# Patient Record
Sex: Male | Born: 1935 | Race: White | Hispanic: No | Marital: Married | Smoking: Former smoker
Health system: Southern US, Community
[De-identification: ages and names within clinical notes are randomized; demographics above are authoritative.]

## PROBLEM LIST (undated history)

## (undated) DIAGNOSIS — M1990 Unspecified osteoarthritis, unspecified site: Secondary | ICD-10-CM

## (undated) DIAGNOSIS — Z8679 Personal history of other diseases of the circulatory system: Secondary | ICD-10-CM

## (undated) DIAGNOSIS — F32A Depression, unspecified: Secondary | ICD-10-CM

## (undated) DIAGNOSIS — I739 Peripheral vascular disease, unspecified: Secondary | ICD-10-CM

## (undated) DIAGNOSIS — F0390 Unspecified dementia without behavioral disturbance: Secondary | ICD-10-CM

## (undated) DIAGNOSIS — G4730 Sleep apnea, unspecified: Secondary | ICD-10-CM

## (undated) DIAGNOSIS — G4733 Obstructive sleep apnea (adult) (pediatric): Secondary | ICD-10-CM

## (undated) DIAGNOSIS — E785 Hyperlipidemia, unspecified: Secondary | ICD-10-CM

## (undated) DIAGNOSIS — I10 Essential (primary) hypertension: Secondary | ICD-10-CM

## (undated) DIAGNOSIS — M75122 Complete rotator cuff tear or rupture of left shoulder, not specified as traumatic: Secondary | ICD-10-CM

## (undated) DIAGNOSIS — F329 Major depressive disorder, single episode, unspecified: Secondary | ICD-10-CM

## (undated) DIAGNOSIS — C801 Malignant (primary) neoplasm, unspecified: Secondary | ICD-10-CM

## (undated) DIAGNOSIS — I4891 Unspecified atrial fibrillation: Secondary | ICD-10-CM

## (undated) DIAGNOSIS — R011 Cardiac murmur, unspecified: Secondary | ICD-10-CM

## (undated) DIAGNOSIS — D649 Anemia, unspecified: Secondary | ICD-10-CM

## (undated) DIAGNOSIS — F419 Anxiety disorder, unspecified: Secondary | ICD-10-CM

## (undated) HISTORY — DX: Obstructive sleep apnea (adult) (pediatric): G47.33

## (undated) HISTORY — DX: Anxiety disorder, unspecified: F41.9

## (undated) HISTORY — PX: OTHER SURGICAL HISTORY: SHX169

## (undated) HISTORY — DX: Unspecified atrial fibrillation: I48.91

## (undated) HISTORY — DX: Peripheral vascular disease, unspecified: I73.9

## (undated) HISTORY — DX: Essential (primary) hypertension: I10

## (undated) HISTORY — DX: Cardiac murmur, unspecified: R01.1

## (undated) HISTORY — DX: Major depressive disorder, single episode, unspecified: F32.9

## (undated) HISTORY — DX: Complete rotator cuff tear or rupture of left shoulder, not specified as traumatic: M75.122

## (undated) HISTORY — DX: Personal history of other diseases of the circulatory system: Z86.79

## (undated) HISTORY — DX: Unspecified dementia without behavioral disturbance: F03.90

## (undated) HISTORY — DX: Depression, unspecified: F32.A

## (undated) HISTORY — DX: Anemia, unspecified: D64.9

## (undated) HISTORY — DX: Sleep apnea, unspecified: G47.30

## (undated) HISTORY — DX: Malignant (primary) neoplasm, unspecified: C80.1

## (undated) HISTORY — DX: Hyperlipidemia, unspecified: E78.5

---

## 2004-09-20 ENCOUNTER — Ambulatory Visit: Payer: Self-pay | Admitting: Family Medicine

## 2005-05-05 HISTORY — PX: SHOULDER SURGERY: SHX246

## 2006-06-01 ENCOUNTER — Ambulatory Visit: Payer: Self-pay | Admitting: Unknown Physician Specialty

## 2006-06-20 ENCOUNTER — Other Ambulatory Visit: Payer: Self-pay

## 2006-06-20 ENCOUNTER — Emergency Department: Payer: Self-pay | Admitting: Emergency Medicine

## 2009-02-15 ENCOUNTER — Ambulatory Visit: Payer: Self-pay | Admitting: Family Medicine

## 2011-09-19 ENCOUNTER — Ambulatory Visit: Payer: Self-pay | Admitting: Unknown Physician Specialty

## 2012-06-10 ENCOUNTER — Ambulatory Visit: Payer: Self-pay | Admitting: Family Medicine

## 2012-09-02 ENCOUNTER — Emergency Department: Payer: Self-pay | Admitting: Emergency Medicine

## 2012-09-02 LAB — URINALYSIS, COMPLETE
Glucose,UR: NEGATIVE mg/dL (ref 0–75)
Nitrite: NEGATIVE
Ph: 8 (ref 4.5–8.0)
Protein: NEGATIVE
RBC,UR: 28 /HPF (ref 0–5)
Squamous Epithelial: <1

## 2012-09-02 LAB — CBC
HCT: 40.6 % (ref 40.0–52.0)
HGB: 14.3 g/dL (ref 13.0–18.0)
MCV: 93 fL (ref 80–100)
Platelet: 159 10*3/uL (ref 150–440)
RBC: 4.37 10*6/uL — ABNORMAL LOW (ref 4.40–5.90)

## 2012-09-02 LAB — COMPREHENSIVE METABOLIC PANEL
BUN: 20 mg/dL — ABNORMAL HIGH (ref 7–18)
Bilirubin,Total: 0.8 mg/dL (ref 0.2–1.0)
Calcium, Total: 8.8 mg/dL (ref 8.5–10.1)
Creatinine: 1.18 mg/dL (ref 0.60–1.30)
EGFR (Non-African Amer.): >60
Glucose: 135 mg/dL — ABNORMAL HIGH (ref 65–99)
Potassium: 4.5 mmol/L (ref 3.5–5.1)
Sodium: 139 mmol/L (ref 136–145)

## 2013-02-24 ENCOUNTER — Ambulatory Visit: Payer: Self-pay | Admitting: Unknown Physician Specialty

## 2013-03-16 ENCOUNTER — Ambulatory Visit: Payer: Self-pay | Admitting: Unknown Physician Specialty

## 2013-04-20 ENCOUNTER — Ambulatory Visit: Payer: Self-pay | Admitting: Family Medicine

## 2013-11-07 DIAGNOSIS — R413 Other amnesia: Secondary | ICD-10-CM | POA: Insufficient documentation

## 2013-11-07 DIAGNOSIS — I10 Essential (primary) hypertension: Secondary | ICD-10-CM | POA: Insufficient documentation

## 2013-11-07 DIAGNOSIS — G4733 Obstructive sleep apnea (adult) (pediatric): Secondary | ICD-10-CM

## 2013-11-07 HISTORY — DX: Essential (primary) hypertension: I10

## 2013-11-07 HISTORY — DX: Obstructive sleep apnea (adult) (pediatric): G47.33

## 2013-12-26 ENCOUNTER — Ambulatory Visit: Payer: Self-pay | Admitting: Unknown Physician Specialty

## 2013-12-30 DIAGNOSIS — M75122 Complete rotator cuff tear or rupture of left shoulder, not specified as traumatic: Secondary | ICD-10-CM

## 2013-12-30 HISTORY — DX: Complete rotator cuff tear or rupture of left shoulder, not specified as traumatic: M75.122

## 2014-01-10 ENCOUNTER — Ambulatory Visit: Payer: Self-pay | Admitting: Unknown Physician Specialty

## 2014-01-10 LAB — CBC
HCT: 38.1 % — AB (ref 40.0–52.0)
HGB: 12.6 g/dL — AB (ref 13.0–18.0)
MCH: 32.2 pg (ref 26.0–34.0)
MCHC: 33.1 g/dL (ref 32.0–36.0)
MCV: 97 fL (ref 80–100)
PLATELETS: 180 10*3/uL (ref 150–440)
RBC: 3.92 10*6/uL — AB (ref 4.40–5.90)
RDW: 13.1 % (ref 11.5–14.5)
WBC: 7.8 10*3/uL (ref 3.8–10.6)

## 2014-01-10 LAB — BASIC METABOLIC PANEL
ANION GAP: 3 — AB (ref 7–16)
BUN: 18 mg/dL (ref 7–18)
CALCIUM: 8.4 mg/dL — AB (ref 8.5–10.1)
CO2: 31 mmol/L (ref 21–32)
Chloride: 108 mmol/L — ABNORMAL HIGH (ref 98–107)
Creatinine: 1.13 mg/dL (ref 0.60–1.30)
EGFR (Non-African Amer.): >60
Glucose: 99 mg/dL (ref 65–99)
OSMOLALITY: 285 (ref 275–301)
POTASSIUM: 4.1 mmol/L (ref 3.5–5.1)
Sodium: 142 mmol/L (ref 136–145)

## 2014-01-23 DIAGNOSIS — Z8679 Personal history of other diseases of the circulatory system: Secondary | ICD-10-CM

## 2014-01-23 DIAGNOSIS — E785 Hyperlipidemia, unspecified: Secondary | ICD-10-CM

## 2014-01-23 DIAGNOSIS — I4891 Unspecified atrial fibrillation: Secondary | ICD-10-CM

## 2014-01-23 HISTORY — DX: Hyperlipidemia, unspecified: E78.5

## 2014-01-23 HISTORY — DX: Personal history of other diseases of the circulatory system: Z86.79

## 2014-01-23 HISTORY — DX: Unspecified atrial fibrillation: I48.91

## 2014-01-27 ENCOUNTER — Ambulatory Visit: Payer: Self-pay | Admitting: Unknown Physician Specialty

## 2014-03-02 DIAGNOSIS — Z9889 Other specified postprocedural states: Secondary | ICD-10-CM | POA: Insufficient documentation

## 2014-09-11 DIAGNOSIS — F0390 Unspecified dementia without behavioral disturbance: Secondary | ICD-10-CM

## 2014-09-11 DIAGNOSIS — I739 Peripheral vascular disease, unspecified: Secondary | ICD-10-CM

## 2014-09-11 DIAGNOSIS — I779 Disorder of arteries and arterioles, unspecified: Secondary | ICD-10-CM

## 2014-09-11 DIAGNOSIS — D649 Anemia, unspecified: Secondary | ICD-10-CM

## 2014-09-11 HISTORY — DX: Unspecified dementia, unspecified severity, without behavioral disturbance, psychotic disturbance, mood disturbance, and anxiety: F03.90

## 2014-09-11 HISTORY — DX: Anemia, unspecified: D64.9

## 2014-09-11 HISTORY — DX: Disorder of arteries and arterioles, unspecified: I77.9

## 2015-03-02 DIAGNOSIS — F4321 Adjustment disorder with depressed mood: Secondary | ICD-10-CM | POA: Insufficient documentation

## 2015-04-10 DIAGNOSIS — F03A0 Unspecified dementia, mild, without behavioral disturbance, psychotic disturbance, mood disturbance, and anxiety: Secondary | ICD-10-CM | POA: Insufficient documentation

## 2015-04-10 DIAGNOSIS — F0390 Unspecified dementia without behavioral disturbance: Secondary | ICD-10-CM | POA: Insufficient documentation

## 2015-05-11 DIAGNOSIS — G4733 Obstructive sleep apnea (adult) (pediatric): Secondary | ICD-10-CM | POA: Diagnosis not present

## 2015-05-30 DIAGNOSIS — N401 Enlarged prostate with lower urinary tract symptoms: Secondary | ICD-10-CM | POA: Diagnosis not present

## 2015-05-30 DIAGNOSIS — R3915 Urgency of urination: Secondary | ICD-10-CM | POA: Diagnosis not present

## 2015-05-30 DIAGNOSIS — R350 Frequency of micturition: Secondary | ICD-10-CM | POA: Diagnosis not present

## 2015-06-11 ENCOUNTER — Ambulatory Visit (INDEPENDENT_AMBULATORY_CARE_PROVIDER_SITE_OTHER): Payer: PPO | Admitting: Urology

## 2015-06-11 ENCOUNTER — Encounter: Payer: Self-pay | Admitting: Urology

## 2015-06-11 VITALS — BP 151/70 | HR 63 | Ht 68.0 in | Wt 180.0 lb

## 2015-06-11 DIAGNOSIS — R350 Frequency of micturition: Secondary | ICD-10-CM | POA: Diagnosis not present

## 2015-06-11 LAB — BLADDER SCAN AMB NON-IMAGING

## 2015-06-11 NOTE — Progress Notes (Signed)
Consultation for lower urinary tract symptoms referred by Dr. Gilford Rile. Patient recently saw Dr. Yves Dill for similar symptoms but Dr. Yves Dill is out of his network. For 2-3 years patient has had a weak stream associated with frequency, urgency and urge incontinence. He's had noted sure your gross hematuria. Dr. Yves Dill gave him Rapaflo which he took for a week and the symptoms may have improved some.  His AUA symptom score is 23 with quality of life 6.  Patient denies other urologic history or surgery. 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  As far as prostate cancer screening he has no family history prostate cancer. His January 2017 PSA was 3.7.  Patient urologic history, past medical and surgical history, family history, social history, medications, allergies reviewed.  13 system review of systems was obtained which was negative apart from the following: Gastrointestinal-heartburn, constipation Constitutional-weight loss, fatigue EYE-blurred vision ENT-sinus problems Musculoskeletal-back pain, joint pain Neurologic-dizziness Psychologic-depression, anxiety  He could not provide a urine specimen today.  Bladder scan post void residual equals 59 mL  Physical exam: Patient no acute distress Neurologic-he ambulates normally and there are no obvious deficits GU: Penis circumcised without mass or lesion. Testicles descended bilaterally and palpably normal. No inguinal hernia palpated. On digital rectal exam the prostate was smooth without hard area or nodule. 1+. Rectal tone was normal. Back-there is an incision over the sacral spinal cord and obvious defect in the bone with herniation present.  Assessment/plan: BPH with urinary frequency, urgency, weak stream-normal PVR. Discussed proper fluid intake as patient has irritative voiding symptoms and constipation. He might not be drinking enough fluids. Discussed trial of alpha  blocker for a longer period versus OAB meds. Discussed nature risks benefits and alternatives to tamsulosin. All questions answered. Will start tamsulosin daily at bedtime for a few weeks and see him back. Symptoms remain bothersome could consider anticholinergic although concern for confusion. Also will get a urinalysis and another post void on return. He might be a good candidate for Myrbetriq or PTNS. Don't see any reason at current time for cystoscopy, but will consider.

## 2015-06-11 NOTE — Addendum Note (Signed)
Addended by: Tommy Rainwater on: 06/11/2015 04:33 PM   Modules accepted: Orders

## 2015-06-12 ENCOUNTER — Encounter: Payer: Self-pay | Admitting: Urology

## 2015-06-13 ENCOUNTER — Other Ambulatory Visit: Payer: Self-pay

## 2015-06-13 DIAGNOSIS — N400 Enlarged prostate without lower urinary tract symptoms: Secondary | ICD-10-CM

## 2015-06-13 MED ORDER — TAMSULOSIN HCL 0.4 MG PO CAPS
0.4000 mg | ORAL_CAPSULE | Freq: Every day | ORAL | Status: DC
Start: 2015-06-13 — End: 2015-12-14

## 2015-06-13 NOTE — Progress Notes (Signed)
Pt called stating Dr. Junious Silk wanted pt to start flomax. When pt called pharmacy medication was not at pharmacy. Nurse noted in Dr. Annita Brod dictation pt was to start flomax. Therefore medication was sent to pt pharmacy.

## 2015-06-21 DIAGNOSIS — G4733 Obstructive sleep apnea (adult) (pediatric): Secondary | ICD-10-CM | POA: Diagnosis not present

## 2015-06-21 DIAGNOSIS — I480 Paroxysmal atrial fibrillation: Secondary | ICD-10-CM | POA: Diagnosis not present

## 2015-06-21 DIAGNOSIS — Z125 Encounter for screening for malignant neoplasm of prostate: Secondary | ICD-10-CM | POA: Diagnosis not present

## 2015-06-21 DIAGNOSIS — D649 Anemia, unspecified: Secondary | ICD-10-CM | POA: Diagnosis not present

## 2015-06-21 DIAGNOSIS — I10 Essential (primary) hypertension: Secondary | ICD-10-CM | POA: Diagnosis not present

## 2015-08-06 ENCOUNTER — Ambulatory Visit: Payer: PPO | Admitting: Urology

## 2015-08-06 ENCOUNTER — Encounter: Payer: Self-pay | Admitting: Urology

## 2015-08-10 ENCOUNTER — Ambulatory Visit (INDEPENDENT_AMBULATORY_CARE_PROVIDER_SITE_OTHER): Payer: PPO | Admitting: Urology

## 2015-08-10 VITALS — BP 172/65 | HR 61 | Temp 97.9°F | Resp 14 | Wt 183.0 lb

## 2015-08-10 DIAGNOSIS — N400 Enlarged prostate without lower urinary tract symptoms: Secondary | ICD-10-CM | POA: Diagnosis not present

## 2015-08-10 DIAGNOSIS — N3281 Overactive bladder: Secondary | ICD-10-CM | POA: Diagnosis not present

## 2015-08-10 LAB — BLADDER SCAN AMB NON-IMAGING: SCAN RESULT: 86

## 2015-08-10 MED ORDER — MIRABEGRON ER 25 MG PO TB24: 25.0000 mg | ORAL_TABLET | Freq: Every day | ORAL | Status: DC

## 2015-08-10 NOTE — Progress Notes (Signed)
08/10/2015 3:45 PM   [name] [name] 1935/02/12 [id number]  Referring provider: Madelyn Brunner, MD [name] [place], [place] [address]  Chief Complaint  Patient presents with  . Urinary Incontinence    Patient is here to follow up on Flomax. Maybe some improvement since been on medication noticed.  . Urinary Frequency    HPI: Consultation for lower urinary tract symptoms referred by Dr. Gilford Rile. Patient recently saw Dr. Yves Ho for similar symptoms but Dr. Yves Ho is out of his network. For 2-3 years patient has had a weak stream associated with frequency, urgency and urge incontinence. He's had noted sure your gross hematuria. Dr. Yves Ho gave him Rapaflo which he took for a week and the symptoms may have improved some.  His AUA symptom score is 23 with quality of life 6.  Patient denies other urologic history or surgery. 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  As far as prostate cancer screening he has no family history prostate cancer. His January 2017 PSA was 3.7.  April 2017 interval history: Patient presents today for follow-up after being on Flomax.The patient I PSS score today is 11/6. He has significant urgency with a score of 5. He also has nocturia 3 and is score rating of 3 another frequency category. He finds this really miserable. He does often has urgent incontinence. He has noticed minimal improvement with Flomax. History of early dementia.   PMH: Past Medical History  Diagnosis Date  . 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 Anxiety   . Depression   . Heart murmur   . Hypertension   . Sleep apnea   . Atrial fibrillation ([place]) 01/23/2014  . BP (high blood pressure) 11/07/2013  . Carotid artery disease ([place]) 09/11/2014  . Obstructive apnea 11/07/2013  . Dementia 09/11/2014  . Complete rotator cuff rupture of left shoulder 12/30/2013  . Absolute anemia 09/11/2014    . HLD (hyperlipidemia) 01/23/2014  . Personal history of other diseases of the circulatory system 01/23/2014    Surgical History: Past Surgical History  Procedure Laterality Date  . Shoulder surgery Right 2007  . Back surgery- tumor removal      Home Medications:    Medication List       This list is accurate as of: 08/10/15  3:45 PM.  Always use your most recent med list.               amLODipine 5 MG tablet  Commonly known as:  NORVASC  Take by mouth.     aspirin EC 81 MG tablet  Take by mouth.     b complex vitamins tablet  Take 1 tablet by mouth daily.     BLACK CURRANT SEED OIL PO  Take by mouth.     cyanocobalamin 500 MCG tablet  Take 500 mcg by mouth daily.     donepezil 10 MG tablet  Commonly known as:  ARICEPT  Take by mouth.     Fish Oil 1000 MG Caps  Take by mouth.     memantine 5 MG tablet  Commonly known as:  NAMENDA  Take by mouth.     MULTI-VITAMINS Tabs  Take by mouth.     RAPAFLO 8 MG Caps capsule  Generic drug:  silodosin  Take 8 mg by mouth daily with breakfast.     tamsulosin 0.4 MG Caps capsule  Commonly known as:  FLOMAX  Take 1 capsule (0.4 mg total) by mouth daily.     vitamin C 1000 MG tablet  Take by mouth.        Allergies:  Allergies  Allergen Reactions  . Soybean Oil     Other reaction(s): Other (See Comments), Unknown Other Reaction: Rash;skin eruptions    Family History: Family History  Problem Relation Age of Onset  . Bladder Cancer Neg Hx   . Prostate cancer Neg Hx   . Kidney cancer Neg Hx     Social History:  reports that he has quit smoking. He does not have any smokeless tobacco history on file. He reports that he does not drink alcohol or use illicit drugs.  ROS: UROLOGY Frequent Urination?: Yes Hard to postpone urination?: Yes Burning/pain with urination?: No Get up at night to urinate?: Yes Leakage of urine?: Yes Urine stream starts and stops?: No Trouble starting stream?: No Do you have  to strain to urinate?: No Blood in urine?: No Urinary tract infection?: No Sexually transmitted disease?: No Injury to kidneys or bladder?: No Painful intercourse?: No Weak stream?: No Erection problems?: Yes Penile pain?: No  Gastrointestinal Nausea?: No Vomiting?: No Indigestion/heartburn?: Yes Diarrhea?: No Constipation?: Yes  Constitutional Fever: No Night sweats?: No Weight loss?: Yes Fatigue?: Yes  Skin Skin rash/lesions?: No Itching?: No  Eyes Blurred vision?: Yes Double vision?: No  Ears/Nose/Throat Sore throat?: No Sinus problems?: No  Hematologic/Lymphatic Swollen glands?: No Easy bruising?: Yes  Cardiovascular Leg swelling?: No Chest pain?: No  Respiratory Cough?: No Shortness of breath?: No  Endocrine Excessive thirst?: No  Musculoskeletal Back pain?: No Joint pain?: No  Neurological Headaches?: No Dizziness?: Yes  Psychologic Depression?: Yes Anxiety?: Yes  Physical Exam: BP 172/65 mmHg  Pulse 61  Temp(Src) 97.9 F (36.6 C)  Resp 14  Wt 183 lb (83.008 kg)  Constitutional:  Alert and oriented, No acute distress. HEENT: [place] AT, moist mucus membranes.  Trachea midline, no masses. Cardiovascular: No clubbing, cyanosis, or edema. Respiratory: Normal respiratory effort, no increased work of breathing. GI: Abdomen is soft, nontender, nondistended, no abdominal masses GU: No CVA tenderness.  Skin: No rashes, bruises or suspicious lesions. Lymph: No cervical or inguinal adenopathy. Neurologic: Grossly intact, no focal deficits, moving all 4 extremities. Psychiatric: Normal mood and affect.  Laboratory Data: Lab Results  Component Value Date   WBC 7.8 01/10/2014   HGB 12.6* 01/10/2014   HCT 38.1* 01/10/2014   MCV 97 01/10/2014   PLT 180 01/10/2014    Lab Results  Component Value Date   CREATININE 1.13 01/10/2014    No results found for: PSA  No results found for: TESTOSTERONE  No results found for:  HGBA1C  Urinalysis    Component Value Date/Time   COLORURINE Yellow 09/02/2012 2151   APPEARANCEUR Clear 09/02/2012 2151   LABSPEC 1.018 09/02/2012 2151   PHURINE 8.0 09/02/2012 2151   GLUCOSEU Negative 09/02/2012 2151   HGBUR 1+ 09/02/2012 2151   BILIRUBINUR Negative 09/02/2012 2151   KETONESUR Negative 09/02/2012 2151   PROTEINUR Negative 09/02/2012 2151   NITRITE Negative 09/02/2012 2151   LEUKOCYTESUR Negative 09/02/2012 2151     Assessment & Plan:    1. BPH -continue flomax  2. Overactive bladder I have started the patient on Myrbetriq 25 mg daily. Hopefully this will help with his overactive bladder and urgency symptoms. He is not a candidate for anticholinergics due to his early dementia. He'll follow-up in 3 months to discuss  his progress.  Return in about 3 months (around 11/09/2015).  Nickie Retort, MD  [facility] [address] [place], [place] [address] [phone]

## 2015-08-16 ENCOUNTER — Telehealth: Payer: Self-pay

## 2015-08-16 NOTE — Telephone Encounter (Signed)
Opened in error

## 2015-09-06 DIAGNOSIS — I10 Essential (primary) hypertension: Secondary | ICD-10-CM | POA: Diagnosis not present

## 2015-09-06 DIAGNOSIS — Z125 Encounter for screening for malignant neoplasm of prostate: Secondary | ICD-10-CM | POA: Diagnosis not present

## 2015-09-06 DIAGNOSIS — G4733 Obstructive sleep apnea (adult) (pediatric): Secondary | ICD-10-CM | POA: Diagnosis not present

## 2015-09-06 DIAGNOSIS — D649 Anemia, unspecified: Secondary | ICD-10-CM | POA: Diagnosis not present

## 2015-09-13 DIAGNOSIS — D649 Anemia, unspecified: Secondary | ICD-10-CM | POA: Diagnosis not present

## 2015-09-13 DIAGNOSIS — G4733 Obstructive sleep apnea (adult) (pediatric): Secondary | ICD-10-CM | POA: Diagnosis not present

## 2015-09-13 DIAGNOSIS — I480 Paroxysmal atrial fibrillation: Secondary | ICD-10-CM | POA: Diagnosis not present

## 2015-09-13 DIAGNOSIS — G8929 Other chronic pain: Secondary | ICD-10-CM | POA: Diagnosis not present

## 2015-09-13 DIAGNOSIS — I10 Essential (primary) hypertension: Secondary | ICD-10-CM | POA: Diagnosis not present

## 2015-09-13 DIAGNOSIS — M545 Low back pain: Secondary | ICD-10-CM | POA: Diagnosis not present

## 2015-09-13 DIAGNOSIS — F0390 Unspecified dementia without behavioral disturbance: Secondary | ICD-10-CM | POA: Diagnosis not present

## 2015-09-13 DIAGNOSIS — M47816 Spondylosis without myelopathy or radiculopathy, lumbar region: Secondary | ICD-10-CM | POA: Diagnosis not present

## 2015-09-13 DIAGNOSIS — I779 Disorder of arteries and arterioles, unspecified: Secondary | ICD-10-CM | POA: Diagnosis not present

## 2015-09-13 DIAGNOSIS — E782 Mixed hyperlipidemia: Secondary | ICD-10-CM | POA: Diagnosis not present

## 2015-10-09 DIAGNOSIS — I10 Essential (primary) hypertension: Secondary | ICD-10-CM | POA: Diagnosis not present

## 2015-10-09 DIAGNOSIS — F0390 Unspecified dementia without behavioral disturbance: Secondary | ICD-10-CM | POA: Diagnosis not present

## 2015-10-09 DIAGNOSIS — Z634 Disappearance and death of family member: Secondary | ICD-10-CM | POA: Diagnosis not present

## 2015-10-09 DIAGNOSIS — G4733 Obstructive sleep apnea (adult) (pediatric): Secondary | ICD-10-CM | POA: Diagnosis not present

## 2015-10-09 DIAGNOSIS — F4321 Adjustment disorder with depressed mood: Secondary | ICD-10-CM | POA: Diagnosis not present

## 2015-11-12 ENCOUNTER — Ambulatory Visit (INDEPENDENT_AMBULATORY_CARE_PROVIDER_SITE_OTHER): Payer: PPO | Admitting: Urology

## 2015-11-12 ENCOUNTER — Encounter: Payer: Self-pay | Admitting: Urology

## 2015-11-12 VITALS — BP 158/73 | HR 46 | Ht 69.0 in | Wt 181.0 lb

## 2015-11-12 DIAGNOSIS — N3281 Overactive bladder: Secondary | ICD-10-CM | POA: Diagnosis not present

## 2015-11-12 LAB — BLADDER SCAN AMB NON-IMAGING

## 2015-11-12 NOTE — Progress Notes (Signed)
81 year old male who is seen today in follow-up after being placed on myrbetriq 25 mg daily 3 months prior in addition to his tamsulosin. He was initially seen with frequency, urgency, and incontinence. His voiding symptoms improved significantly on tamsulosin alone. However, he continued to have urinary urgency and occasional urge incontinence. As such, we started the patient on myrbetriq 25 mg daily. He subsequently presents today for further eval. The patient states that he is completed the myrbetriq and does not appreciate any significant difference. However, overall the patient does feel as if he is voiding better and has had less axes. He is "and more control of his symptoms". Next  The patient has dementia and his history was obtained through the patient as well as the patient's wife.  Current Outpatient Prescriptions on File Prior to Visit  Medication Sig Dispense Refill  . amLODipine (NORVASC) 5 MG tablet Take by mouth.    . Ascorbic Acid (VITAMIN C) 1000 MG tablet Take by mouth.    Marland Kitchen aspirin EC 81 MG tablet Take by mouth.    Marland Kitchen b complex vitamins tablet Take 1 tablet by mouth daily.    Marland Kitchen BLACK CURRANT SEED OIL PO Take by mouth.    . cyanocobalamin 500 MCG tablet Take 500 mcg by mouth daily.    Marland Kitchen donepezil (ARICEPT) 10 MG tablet Take by mouth.    . memantine (NAMENDA) 5 MG tablet Take by mouth.    . Multiple Vitamin (MULTI-VITAMINS) TABS Take by mouth.    . Omega-3 Fatty Acids (FISH OIL) 1000 MG CAPS Take by mouth.    . tamsulosin (FLOMAX) 0.4 MG CAPS capsule Take 1 capsule (0.4 mg total) by mouth daily. 30 capsule 3   No current facility-administered medications on file prior to visit.   Past Medical History  Diagnosis Date  . 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 Anxiety   . Depression   . Heart murmur   . Hypertension   . Sleep apnea   . Atrial fibrillation ([place]) 01/23/2014  . BP (high blood pressure) 11/07/2013  . Carotid artery disease ([place]) 09/11/2014  .  Obstructive apnea 11/07/2013  . Dementia 09/11/2014  . Complete rotator cuff rupture of left shoulder 12/30/2013  . Absolute anemia 09/11/2014  . HLD (hyperlipidemia) 01/23/2014  . Personal history of other diseases of the circulatory system 01/23/2014   Past Surgical History  Procedure Laterality Date  . Shoulder surgery Right 2007  . Back surgery- tumor removal      PE: NAD Alert and ORiented  Impression/Rec: The patient's voiding symptoms are significantly better on tamsulosin alone. He did not seem to have any significant benefit of myrbetriq 25 mg daily. We discussed increasing this to 50 mg daily although I did tell the patient that this was a quality of life decision and not necessary a few is okay with the way his symptoms were today. As such, the patient has opted to continue a tamsulosin but would not like to try any additional medications for his overactive symptoms. Our plan at this point is to have the patient return in 1 year for follow-up of his lower urinary tract symptoms. He'll continue tamsulosin 2.4 milligrams daily. I encouraged the patient to come back sooner with progression of symptoms.

## 2015-11-13 DIAGNOSIS — I6523 Occlusion and stenosis of bilateral carotid arteries: Secondary | ICD-10-CM | POA: Diagnosis not present

## 2015-11-13 DIAGNOSIS — I6529 Occlusion and stenosis of unspecified carotid artery: Secondary | ICD-10-CM | POA: Diagnosis not present

## 2015-11-13 DIAGNOSIS — E785 Hyperlipidemia, unspecified: Secondary | ICD-10-CM | POA: Diagnosis not present

## 2015-11-13 DIAGNOSIS — I10 Essential (primary) hypertension: Secondary | ICD-10-CM | POA: Diagnosis not present

## 2015-11-27 DIAGNOSIS — K432 Incisional hernia without obstruction or gangrene: Secondary | ICD-10-CM | POA: Diagnosis not present

## 2015-12-12 DIAGNOSIS — K458 Other specified abdominal hernia without obstruction or gangrene: Secondary | ICD-10-CM | POA: Diagnosis not present

## 2015-12-12 DIAGNOSIS — Z01818 Encounter for other preprocedural examination: Secondary | ICD-10-CM | POA: Diagnosis not present

## 2015-12-14 ENCOUNTER — Other Ambulatory Visit: Payer: Self-pay | Admitting: Urology

## 2015-12-14 DIAGNOSIS — N400 Enlarged prostate without lower urinary tract symptoms: Secondary | ICD-10-CM

## 2015-12-18 DIAGNOSIS — K458 Other specified abdominal hernia without obstruction or gangrene: Secondary | ICD-10-CM | POA: Diagnosis not present

## 2015-12-18 DIAGNOSIS — R194 Change in bowel habit: Secondary | ICD-10-CM | POA: Diagnosis not present

## 2015-12-18 DIAGNOSIS — K5730 Diverticulosis of large intestine without perforation or abscess without bleeding: Secondary | ICD-10-CM | POA: Diagnosis not present

## 2015-12-18 DIAGNOSIS — Z1211 Encounter for screening for malignant neoplasm of colon: Secondary | ICD-10-CM | POA: Diagnosis not present

## 2015-12-19 DIAGNOSIS — Z01818 Encounter for other preprocedural examination: Secondary | ICD-10-CM | POA: Diagnosis not present

## 2015-12-19 DIAGNOSIS — K458 Other specified abdominal hernia without obstruction or gangrene: Secondary | ICD-10-CM | POA: Diagnosis not present

## 2015-12-24 DIAGNOSIS — Z961 Presence of intraocular lens: Secondary | ICD-10-CM | POA: Diagnosis not present

## 2015-12-24 DIAGNOSIS — H35373 Puckering of macula, bilateral: Secondary | ICD-10-CM | POA: Diagnosis not present

## 2015-12-26 DIAGNOSIS — K458 Other specified abdominal hernia without obstruction or gangrene: Secondary | ICD-10-CM | POA: Diagnosis not present

## 2016-01-25 DIAGNOSIS — K458 Other specified abdominal hernia without obstruction or gangrene: Secondary | ICD-10-CM | POA: Diagnosis not present

## 2016-01-28 DIAGNOSIS — K458 Other specified abdominal hernia without obstruction or gangrene: Secondary | ICD-10-CM | POA: Diagnosis not present

## 2016-02-15 DIAGNOSIS — M5442 Lumbago with sciatica, left side: Secondary | ICD-10-CM | POA: Diagnosis not present

## 2016-02-15 DIAGNOSIS — M533 Sacrococcygeal disorders, not elsewhere classified: Secondary | ICD-10-CM | POA: Diagnosis not present

## 2016-02-15 DIAGNOSIS — M5441 Lumbago with sciatica, right side: Secondary | ICD-10-CM | POA: Diagnosis not present

## 2016-02-16 ENCOUNTER — Encounter (HOSPITAL_COMMUNITY): Payer: Self-pay | Admitting: Neurology

## 2016-02-16 ENCOUNTER — Inpatient Hospital Stay (HOSPITAL_COMMUNITY)
Admission: EM | Admit: 2016-02-16 | Discharge: 2016-02-25 | DRG: 963 | Disposition: A | Discharge status: Skilled Nursing Facility | Payer: PPO | Attending: General Surgery | Admitting: General Surgery

## 2016-02-16 ENCOUNTER — Emergency Department: Payer: PPO

## 2016-02-16 ENCOUNTER — Encounter: Payer: Self-pay | Admitting: Emergency Medicine

## 2016-02-16 ENCOUNTER — Emergency Department
Admission: EM | Admit: 2016-02-16 | Discharge: 2016-02-16 | Payer: PPO | Attending: Emergency Medicine | Admitting: Emergency Medicine

## 2016-02-16 DIAGNOSIS — S0101XA Laceration without foreign body of scalp, initial encounter: Secondary | ICD-10-CM | POA: Diagnosis not present

## 2016-02-16 DIAGNOSIS — W19XXXA Unspecified fall, initial encounter: Secondary | ICD-10-CM | POA: Diagnosis not present

## 2016-02-16 DIAGNOSIS — G4733 Obstructive sleep apnea (adult) (pediatric): Secondary | ICD-10-CM | POA: Diagnosis not present

## 2016-02-16 DIAGNOSIS — W1839XA Other fall on same level, initial encounter: Secondary | ICD-10-CM | POA: Diagnosis present

## 2016-02-16 DIAGNOSIS — R0989 Other specified symptoms and signs involving the circulatory and respiratory systems: Secondary | ICD-10-CM

## 2016-02-16 DIAGNOSIS — Z87891 Personal history of nicotine dependence: Secondary | ICD-10-CM | POA: Insufficient documentation

## 2016-02-16 DIAGNOSIS — Z7982 Long term (current) use of aspirin: Secondary | ICD-10-CM | POA: Insufficient documentation

## 2016-02-16 DIAGNOSIS — S3991XA Unspecified injury of abdomen, initial encounter: Secondary | ICD-10-CM | POA: Diagnosis not present

## 2016-02-16 DIAGNOSIS — I4510 Unspecified right bundle-branch block: Secondary | ICD-10-CM | POA: Diagnosis not present

## 2016-02-16 DIAGNOSIS — F419 Anxiety disorder, unspecified: Secondary | ICD-10-CM | POA: Diagnosis present

## 2016-02-16 DIAGNOSIS — I4891 Unspecified atrial fibrillation: Secondary | ICD-10-CM | POA: Diagnosis not present

## 2016-02-16 DIAGNOSIS — S3690XA Unspecified injury of unspecified intra-abdominal organ, initial encounter: Secondary | ICD-10-CM

## 2016-02-16 DIAGNOSIS — N281 Cyst of kidney, acquired: Secondary | ICD-10-CM | POA: Insufficient documentation

## 2016-02-16 DIAGNOSIS — Z930 Tracheostomy status: Secondary | ICD-10-CM

## 2016-02-16 DIAGNOSIS — I10 Essential (primary) hypertension: Secondary | ICD-10-CM | POA: Diagnosis present

## 2016-02-16 DIAGNOSIS — D62 Acute posthemorrhagic anemia: Secondary | ICD-10-CM | POA: Diagnosis not present

## 2016-02-16 DIAGNOSIS — R310 Gross hematuria: Secondary | ICD-10-CM | POA: Diagnosis not present

## 2016-02-16 DIAGNOSIS — I480 Paroxysmal atrial fibrillation: Secondary | ICD-10-CM | POA: Diagnosis not present

## 2016-02-16 DIAGNOSIS — F329 Major depressive disorder, single episode, unspecified: Secondary | ICD-10-CM | POA: Diagnosis present

## 2016-02-16 DIAGNOSIS — F0281 Dementia in other diseases classified elsewhere with behavioral disturbance: Secondary | ICD-10-CM | POA: Insufficient documentation

## 2016-02-16 DIAGNOSIS — J90 Pleural effusion, not elsewhere classified: Secondary | ICD-10-CM | POA: Diagnosis not present

## 2016-02-16 DIAGNOSIS — S0100XA Unspecified open wound of scalp, initial encounter: Secondary | ICD-10-CM | POA: Diagnosis not present

## 2016-02-16 DIAGNOSIS — Z79899 Other long term (current) drug therapy: Secondary | ICD-10-CM | POA: Diagnosis not present

## 2016-02-16 DIAGNOSIS — M25522 Pain in left elbow: Secondary | ICD-10-CM | POA: Diagnosis not present

## 2016-02-16 DIAGNOSIS — S299XXA Unspecified injury of thorax, initial encounter: Secondary | ICD-10-CM | POA: Diagnosis not present

## 2016-02-16 DIAGNOSIS — Y939 Activity, unspecified: Secondary | ICD-10-CM | POA: Insufficient documentation

## 2016-02-16 DIAGNOSIS — S270XXA Traumatic pneumothorax, initial encounter: Principal | ICD-10-CM | POA: Diagnosis present

## 2016-02-16 DIAGNOSIS — S36039A Unspecified laceration of spleen, initial encounter: Secondary | ICD-10-CM | POA: Diagnosis present

## 2016-02-16 DIAGNOSIS — Z8679 Personal history of other diseases of the circulatory system: Secondary | ICD-10-CM | POA: Diagnosis not present

## 2016-02-16 DIAGNOSIS — M533 Sacrococcygeal disorders, not elsewhere classified: Secondary | ICD-10-CM | POA: Diagnosis present

## 2016-02-16 DIAGNOSIS — F0280 Dementia in other diseases classified elsewhere without behavioral disturbance: Secondary | ICD-10-CM | POA: Diagnosis not present

## 2016-02-16 DIAGNOSIS — E785 Hyperlipidemia, unspecified: Secondary | ICD-10-CM | POA: Diagnosis not present

## 2016-02-16 DIAGNOSIS — G309 Alzheimer's disease, unspecified: Secondary | ICD-10-CM | POA: Insufficient documentation

## 2016-02-16 DIAGNOSIS — Y999 Unspecified external cause status: Secondary | ICD-10-CM | POA: Insufficient documentation

## 2016-02-16 DIAGNOSIS — S2242XA Multiple fractures of ribs, left side, initial encounter for closed fracture: Secondary | ICD-10-CM | POA: Diagnosis present

## 2016-02-16 DIAGNOSIS — R10812 Left upper quadrant abdominal tenderness: Secondary | ICD-10-CM | POA: Diagnosis not present

## 2016-02-16 DIAGNOSIS — K661 Hemoperitoneum: Secondary | ICD-10-CM

## 2016-02-16 DIAGNOSIS — Y9222 Religious institution as the place of occurrence of the external cause: Secondary | ICD-10-CM | POA: Insufficient documentation

## 2016-02-16 DIAGNOSIS — S36030A Superficial (capsular) laceration of spleen, initial encounter: Secondary | ICD-10-CM | POA: Diagnosis not present

## 2016-02-16 DIAGNOSIS — S59902A Unspecified injury of left elbow, initial encounter: Secondary | ICD-10-CM | POA: Diagnosis not present

## 2016-02-16 DIAGNOSIS — Z91018 Allergy to other foods: Secondary | ICD-10-CM | POA: Diagnosis not present

## 2016-02-16 DIAGNOSIS — M542 Cervicalgia: Secondary | ICD-10-CM

## 2016-02-16 DIAGNOSIS — T1490XA Injury, unspecified, initial encounter: Secondary | ICD-10-CM

## 2016-02-16 DIAGNOSIS — S199XXA Unspecified injury of neck, initial encounter: Secondary | ICD-10-CM | POA: Diagnosis not present

## 2016-02-16 DIAGNOSIS — R0781 Pleurodynia: Secondary | ICD-10-CM | POA: Diagnosis not present

## 2016-02-16 DIAGNOSIS — F0390 Unspecified dementia without behavioral disturbance: Secondary | ICD-10-CM | POA: Diagnosis not present

## 2016-02-16 DIAGNOSIS — I2510 Atherosclerotic heart disease of native coronary artery without angina pectoris: Secondary | ICD-10-CM | POA: Diagnosis present

## 2016-02-16 DIAGNOSIS — S0990XA Unspecified injury of head, initial encounter: Secondary | ICD-10-CM | POA: Diagnosis not present

## 2016-02-16 DIAGNOSIS — S2249XA Multiple fractures of ribs, unspecified side, initial encounter for closed fracture: Secondary | ICD-10-CM

## 2016-02-16 LAB — CBC
HCT: 39.2 % — ABNORMAL LOW (ref 40.0–52.0)
Hemoglobin: 13.3 g/dL (ref 13.0–18.0)
MCH: 32 pg (ref 26.0–34.0)
MCHC: 34 g/dL (ref 32.0–36.0)
MCV: 94.1 fL (ref 80.0–100.0)
PLATELETS: 144 10*3/uL — AB (ref 150–440)
RBC: 4.17 MIL/uL — ABNORMAL LOW (ref 4.40–5.90)
RDW: 13.1 % (ref 11.5–14.5)
WBC: 16.6 10*3/uL — AB (ref 3.8–10.6)

## 2016-02-16 LAB — BASIC METABOLIC PANEL
Anion gap: 8 (ref 5–15)
BUN: 27 mg/dL — AB (ref 6–20)
CALCIUM: 9 mg/dL (ref 8.9–10.3)
CO2: 26 mmol/L (ref 22–32)
CREATININE: 1.38 mg/dL — AB (ref 0.61–1.24)
Chloride: 107 mmol/L (ref 101–111)
GFR, EST AFRICAN AMERICAN: 55 mL/min — AB (ref >60)
GFR, EST NON AFRICAN AMERICAN: 47 mL/min — AB (ref >60)
Glucose, Bld: 131 mg/dL — ABNORMAL HIGH (ref 65–99)
Potassium: 3.9 mmol/L (ref 3.5–5.1)
SODIUM: 141 mmol/L (ref 135–145)

## 2016-02-16 LAB — URINALYSIS COMPLETE WITH MICROSCOPIC (ARMC ONLY)
BILIRUBIN URINE: NEGATIVE
Bacteria, UA: NONE SEEN
GLUCOSE, UA: NEGATIVE mg/dL
Leukocytes, UA: NEGATIVE
NITRITE: NEGATIVE
PROTEIN: NEGATIVE mg/dL
Specific Gravity, Urine: 1.042 — ABNORMAL HIGH (ref 1.005–1.030)
Squamous Epithelial / LPF: NONE SEEN
pH: 6 (ref 5.0–8.0)

## 2016-02-16 LAB — HEPATIC FUNCTION PANEL
ALBUMIN: 3 g/dL — AB (ref 3.5–5.0)
ALT: 16 U/L — AB (ref 17–63)
AST: 28 U/L (ref 15–41)
Alkaline Phosphatase: 39 U/L (ref 38–126)
BILIRUBIN INDIRECT: 0.7 mg/dL (ref 0.3–0.9)
Bilirubin, Direct: 0.1 mg/dL (ref 0.1–0.5)
TOTAL PROTEIN: 5.3 g/dL — AB (ref 6.5–8.1)
Total Bilirubin: 0.8 mg/dL (ref 0.3–1.2)

## 2016-02-16 LAB — TYPE AND SCREEN
ABO/RH(D): A NEG
ANTIBODY SCREEN: NEGATIVE

## 2016-02-16 LAB — APTT: aPTT: 28 seconds (ref 24–36)

## 2016-02-16 LAB — PROTIME-INR
INR: 1.14
Prothrombin Time: 14.7 seconds (ref 11.4–15.2)

## 2016-02-16 LAB — AMYLASE: AMYLASE: 102 U/L — AB (ref 28–100)

## 2016-02-16 MED ORDER — ONDANSETRON HCL 4 MG/2ML IJ SOLN
4.0000 mg | Freq: Four times a day (QID) | INTRAMUSCULAR | Status: DC | PRN
  Administered 2016-02-19 – 2016-02-20 (×2): 4 mg via INTRAVENOUS
  Filled 2016-02-16 (×2): qty 2

## 2016-02-16 MED ORDER — METHOCARBAMOL 500 MG PO TABS
500.0000 mg | ORAL_TABLET | Freq: Three times a day (TID) | ORAL | Status: DC | PRN
  Administered 2016-02-20 – 2016-02-23 (×3): 500 mg via ORAL
  Filled 2016-02-16 (×3): qty 1

## 2016-02-16 MED ORDER — MEMANTINE HCL 5 MG PO TABS
5.0000 mg | ORAL_TABLET | Freq: Two times a day (BID) | ORAL | Status: DC
  Administered 2016-02-16 – 2016-02-25 (×17): 5 mg via ORAL
  Filled 2016-02-16 (×17): qty 1

## 2016-02-16 MED ORDER — ONDANSETRON HCL 4 MG PO TABS: 4.0000 mg | ORAL_TABLET | Freq: Four times a day (QID) | ORAL | Status: DC | PRN

## 2016-02-16 MED ORDER — OXYCODONE HCL 5 MG PO TABS
2.5000 mg | ORAL_TABLET | ORAL | Status: DC | PRN
  Administered 2016-02-18: 2.5 mg via ORAL
  Filled 2016-02-16: qty 1

## 2016-02-16 MED ORDER — AMLODIPINE BESYLATE 5 MG PO TABS
5.0000 mg | ORAL_TABLET | Freq: Every day | ORAL | Status: DC
  Administered 2016-02-16 – 2016-02-25 (×8): 5 mg via ORAL
  Filled 2016-02-16 (×9): qty 1

## 2016-02-16 MED ORDER — SODIUM CHLORIDE 0.9 % IV BOLUS (SEPSIS)
1000.0000 mL | Freq: Once | INTRAVENOUS | Status: AC
  Administered 2016-02-16: 1000 mL via INTRAVENOUS

## 2016-02-16 MED ORDER — MORPHINE SULFATE (PF) 2 MG/ML IV SOLN
0.5000 mg | INTRAVENOUS | Status: DC | PRN
  Administered 2016-02-17: 0.5 mg via INTRAVENOUS
  Filled 2016-02-16: qty 1

## 2016-02-16 MED ORDER — IOPAMIDOL (ISOVUE-300) INJECTION 61%
100.0000 mL | Freq: Once | INTRAVENOUS | Status: AC | PRN
  Administered 2016-02-16: 100 mL via INTRAVENOUS

## 2016-02-16 MED ORDER — LIDOCAINE-EPINEPHRINE 2 %-1:100000 IJ SOLN
20.0000 mL | Freq: Once | INTRAMUSCULAR | Status: DC
  Filled 2016-02-16: qty 20

## 2016-02-16 MED ORDER — LIDOCAINE-EPINEPHRINE (PF) 1 %-1:200000 IJ SOLN
INTRAMUSCULAR | Status: AC
  Filled 2016-02-16: qty 30

## 2016-02-16 MED ORDER — POTASSIUM CHLORIDE IN NACL 20-0.9 MEQ/L-% IV SOLN
INTRAVENOUS | Status: DC
  Administered 2016-02-16 – 2016-02-17 (×3): via INTRAVENOUS
  Filled 2016-02-16 (×6): qty 1000

## 2016-02-16 MED ORDER — DOCUSATE SODIUM 100 MG PO CAPS
100.0000 mg | ORAL_CAPSULE | Freq: Two times a day (BID) | ORAL | Status: DC
  Administered 2016-02-16 – 2016-02-24 (×14): 100 mg via ORAL
  Filled 2016-02-16 (×16): qty 1

## 2016-02-16 MED ORDER — TETANUS-DIPHTH-ACELL PERTUSSIS 5-2.5-18.5 LF-MCG/0.5 IM SUSP
0.5000 mL | Freq: Once | INTRAMUSCULAR | Status: AC
  Administered 2016-02-16: 0.5 mL via INTRAMUSCULAR
  Filled 2016-02-16: qty 0.5

## 2016-02-16 MED ORDER — TAMSULOSIN HCL 0.4 MG PO CAPS
0.4000 mg | ORAL_CAPSULE | Freq: Every day | ORAL | Status: DC
  Administered 2016-02-16 – 2016-02-25 (×9): 0.4 mg via ORAL
  Filled 2016-02-16 (×9): qty 1

## 2016-02-16 MED ORDER — HYDROMORPHONE HCL 1 MG/ML IJ SOLN
1.0000 mg | Freq: Once | INTRAMUSCULAR | Status: AC
Start: 2016-02-16 — End: 2016-02-16
  Administered 2016-02-16: 1 mg via INTRAVENOUS
  Filled 2016-02-16: qty 1

## 2016-02-16 MED ORDER — DONEPEZIL HCL 10 MG PO TABS
10.0000 mg | ORAL_TABLET | Freq: Every day | ORAL | Status: DC
  Administered 2016-02-16 – 2016-02-24 (×9): 10 mg via ORAL
  Filled 2016-02-16: qty 2
  Filled 2016-02-16 (×8): qty 1

## 2016-02-16 MED ORDER — FENTANYL CITRATE (PF) 100 MCG/2ML IJ SOLN
75.0000 ug | Freq: Once | INTRAMUSCULAR | Status: AC
  Administered 2016-02-16: 75 ug via INTRAVENOUS
  Filled 2016-02-16: qty 2

## 2016-02-16 MED ORDER — ACETAMINOPHEN 325 MG PO TABS
650.0000 mg | ORAL_TABLET | Freq: Four times a day (QID) | ORAL | Status: AC
  Administered 2016-02-16 – 2016-02-19 (×5): 650 mg via ORAL
  Filled 2016-02-16 (×5): qty 2

## 2016-02-16 NOTE — ED Triage Notes (Signed)
Per [facility]- pt comes from [facility] ED trauma transfer accepted by Dr. Redmond Pulling for left sided rib fractures, small pneumothorax, free fluid in abdomen. Today pt was standing [place] a stage at church and fell backwards on the ground with questionable LOC. Received 2 L NS, has an additional 2 L NS infusing now. BP 150/78, HR 60, 100%. Pt has 2 lacerations to left head, that were not sutured. Pt is a x 4.

## 2016-02-16 NOTE — H&P (Signed)
History   [name] is an 81 y.o. male.   Chief Complaint: fall Chief Complaint  Patient presents with  . Trauma    HPI 81 year old Caucasian male with hypertension, coronary artery disease, hyperlipidemia chronic sacral pain, some baseline dementia was at church earlier today trying to collect his trumpet when he fell. He does not remember the event. He had a scalp laceration. He complained of some chest wall tenderness. He was taken to [facility] emergency room. CT of the head, C-spine, chest and pelvis was performed. He was found to have left rib fractures 4 through 9 and 10th. He was also found to have some blood in his abdomen probably from a minor spleen laceration. There is no evidence of active extravasation. His creatinine was slightly elevated. His baseline is around 1.1. Based on the numerous rib fractures we were contacted for acceptance for admission.  The patient is pleasantly confused and rambles occasionally. He he states that he was at church earlier today trying to collect his trumpet but the lighting was bad and he was rummaging around in the dark with a flashlight and next thing he knew he fell. He denies any abdominal pain, new onset lower extremity pain,, shortness of breath. He does endorse some left chest wall tenderness. He has recently seen an orthopedic surgeon as well as a Sports administrator at [facility] for lower back-sacral pain. 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 It was not felt that the hernia was a source of his sacral pain. He most recently saw an orthopedic surgeon and was given a package of steroids. But he has not taken any. Wife reports that he does have sleep apnea and uses C Pap at night. She does confirm that he has some early onset mild dementia and short-term memory issues Past Medical History:  Diagnosis Date  . Absolute anemia 09/11/2014  . Anxiety   . Atrial fibrillation ([place]) 01/23/2014  . BP (high blood  pressure) 11/07/2013  . Carotid artery disease ([place]) 09/11/2014  . Complete rotator cuff rupture of left shoulder 12/30/2013  . Dementia 09/11/2014  . Depression   . Heart murmur   . HLD (hyperlipidemia) 01/23/2014  . Hypertension   . Obstructive apnea 11/07/2013  . Personal history of other diseases of the circulatory system 01/23/2014  . Sleep apnea   . 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 Back Surgery- Tumor Removal    . SHOULDER SURGERY Right 2007    Family History  Problem Relation Age of Onset  . Bladder Cancer Neg Hx   . Prostate cancer Neg Hx   . Kidney cancer Neg Hx    Social History:  reports that he has quit smoking. He has never used smokeless tobacco. He reports that he does not drink alcohol or use drugs.  Allergies   Allergies  Allergen Reactions  . Soybean Oil     Other reaction(s): Other (See Comments), Unknown Other Reaction: Rash;skin eruptions    Home Medications   (Not in a hospital admission)  Trauma Course   Results for orders placed or performed during the hospital encounter of 02/16/16 (from the past 48 hour(s))  CBC     Status: Abnormal   Collection Time: 02/16/16  2:27 PM  Result Value Ref Range   WBC 16.6 (H) 3.8 - 10.6 K/uL   RBC 4.17 (L) 4.40 - 5.90 MIL/uL  Hemoglobin 13.3 13.0 - 18.0 g/dL   HCT 39.2 (L) 40.0 - 52.0 %   MCV 94.1 80.0 - 100.0 fL   MCH 32.0 26.0 - 34.0 pg   MCHC 34.0 32.0 - 36.0 g/dL   RDW 13.1 11.5 - 14.5 %   Platelets 144 (L) 150 - 440 K/uL  Basic metabolic panel     Status: Abnormal   Collection Time: 02/16/16  2:27 PM  Result Value Ref Range   Sodium 141 135 - 145 mmol/L   Potassium 3.9 3.5 - 5.1 mmol/L   Chloride 107 101 - 111 mmol/L   CO2 26 22 - 32 mmol/L   Glucose, Bld 131 (H) 65 - 99 mg/dL   BUN 27 (H) 6 - 20 mg/dL   Creatinine, Ser 1.38 (H) 0.61 - 1.24 mg/dL   Calcium 9.0 8.9 - 10.3 mg/dL   GFR calc non Af Amer 47 (L) >60 mL/min   GFR calc  Af Amer 55 (L) >60 mL/min    Comment: (NOTE) The eGFR has been calculated using the CKD EPI equation. This calculation has not been validated in all clinical situations. eGFR's persistently <60 mL/min signify possible Chronic Kidney Disease.    Anion gap 8 5 - 15  APTT     Status: None   Collection Time: 02/16/16  2:27 PM  Result Value Ref Range   aPTT 28 24 - 36 seconds  Protime-INR     Status: None   Collection Time: 02/16/16  2:27 PM  Result Value Ref Range   Prothrombin Time 14.7 11.4 - 15.2 seconds   INR 1.14   Urinalysis complete, with microscopic ([facility] only)     Status: Abnormal   Collection Time: 02/16/16  4:37 PM  Result Value Ref Range   Color, Urine YELLOW (A) YELLOW   APPearance CLEAR (A) CLEAR   Glucose, UA NEGATIVE NEGATIVE mg/dL   Bilirubin Urine NEGATIVE NEGATIVE   Ketones, ur TRACE (A) NEGATIVE mg/dL   Specific Gravity, Urine 1.042 (H) 1.005 - 1.030   Hgb urine dipstick 3+ (A) NEGATIVE   pH 6.0 5.0 - 8.0   Protein, ur NEGATIVE NEGATIVE mg/dL   Nitrite NEGATIVE NEGATIVE   Leukocytes, UA NEGATIVE NEGATIVE   RBC / HPF 6-30 0 - 5 RBC/hpf   WBC, UA 6-30 0 - 5 WBC/hpf   Bacteria, UA NONE SEEN NONE SEEN   Squamous Epithelial / LPF NONE SEEN NONE SEEN   Mucous PRESENT    Dg Chest 1 View  Result Date: 02/16/2016 CLINICAL DATA:  Fall EXAM: CHEST 1 VIEW COMPARISON:  Chest x-ray dated 04/20/2013. FINDINGS: Heart size and mediastinal contours are stable. Atherosclerotic calcifications again noted at the aortic arch. Calcified pleural plaques again noted bilaterally. Lungs otherwise clear. No new lung findings. No pleural effusion or pneumothorax seen. There is a slightly displaced fracture of the left lateral eighth rib. There is an additional slightly displaced fracture of the left posterior fourth rib. IMPRESSION: 1. Slightly displaced acute-appearing fractures of the posterior left fourth rib and lateral left eighth rib. 2. Calcified pleural plaques bilaterally  suggesting prior asbestos exposure. No new lung findings. No pleural effusion or pneumothorax seen. 3. Aortic atherosclerosis. Electronically Signed   By: Franki Cabot M.D.   On: 02/16/2016 14:23   Dg Elbow Complete Left  Result Date: 02/16/2016 CLINICAL DATA:  Pain after fall. EXAM: LEFT ELBOW - COMPLETE 3+ VIEW COMPARISON:  None. FINDINGS: There is no evidence of fracture, dislocation, or joint effusion. There is no  evidence of arthropathy or other focal bone abnormality. Soft tissues are unremarkable. IMPRESSION: Negative. Electronically Signed   By: Dorise Bullion III M.D   On: 02/16/2016 14:21   Ct Head Wo Contrast  Result Date: 02/16/2016 CLINICAL DATA:  Unwitnessed fall at church hitting back of head. History dementia. EXAM: CT HEAD WITHOUT CONTRAST CT CERVICAL SPINE WITHOUT CONTRAST TECHNIQUE: Multidetector CT imaging of the head and cervical spine was performed following the standard protocol without intravenous contrast. Multiplanar CT image reconstructions of the cervical spine were also generated. COMPARISON:  None. FINDINGS: CT HEAD FINDINGS Brain: Age advanced atrophy with sulcal prominence and centralized volume loss with commensurate ex vacuo dilatation of the ventricular system. Scattered periventricular hypodensities compatible with microvascular ischemic disease. Given background parenchymal abnormalities, there is no CT evidence of superimposed acute large territory infarct. No intraparenchymal or extra-axial mass or hemorrhage. Normal configuration of the ventricles and basilar cisterns. No midline shift. Vascular: No hyperdense vessel or unexpected calcification. Skull: No displaced calvarial fracture with special attention paid to the high right posterior parietal calvarium. Sinuses/Orbits: Limited visualization the paranasal sinuses and mastoid air cells is normal. No air-fluid levels. Post bilateral cataract surgery. Other: Scattered punctate foci of subcutaneous emphysema are  noted about the high right posterior parietal calvarium (representative images 29 and 30, series 2) with minimal amount of adjacent subcutaneous edema. No radiopaque foreign body. CT CERVICAL SPINE FINDINGS Alignment: C1 to the superior endplate of T2 is imaged. Normal alignment of the cervical spine. No anterolisthesis or retrolisthesis. The bilateral facets are normally aligned. Skull base and vertebrae: The dens is normally positioned between the lateral masses of C1. Moderate degenerative change of the atlantodental articulation with several adjacent punctate ossicles. Normal atlanto axial articulations. Soft tissues and spinal canal: Prevertebral soft tissues are normal. Disc levels: Mild-to-moderate multilevel cervical spine DDD, worse at C3-C4 with disc space height loss, endplate irregularity and sclerosis. Upper chest: Limited visualization of the lung apices is normal. Other: Calcified atherosclerotic plaque within the bilateral carotid bulbs. There is mild diffuse heterogeneity of thyroid parenchyma. No bulky cervical lymphadenopathy on this noncontrast examination. IMPRESSION: 1. Suspected laceration about the high right posterior parietal calvarium without associated radiopaque foreign body, displaced calvarial fracture or acute intracranial process. 2. No fracture or static subluxation of the cervical spine. 3. Age advanced atrophy and mild microvascular ischemic disease. 4. Mild to moderate multilevel cervical spine DDD, worse at C3-C4. Electronically Signed   By: Sandi Mariscal M.D.   On: 02/16/2016 13:27   Ct Chest W Contrast  Result Date: 02/16/2016 CLINICAL DATA:  Pt arrived to ED after an unwitnessed fall at church. Per EMS the pt fell 58f from church stage and hit his head. Pt also had c/o of left side rib pain. EXAM: CT CHEST, ABDOMEN, AND PELVIS WITH CONTRAST CT THORACIC SPINE without contrast TECHNIQUE: Multidetector CT imaging of the chest, abdomen and pelvis was performed following the  standard protocol during bolus administration of intravenous contrast. Multi detector CT images of the thoracic spine were performed with standard reconstructions. CONTRAST:  10104mISOVUE-300 IOPAMIDOL (ISOVUE-300) INJECTION 61% COMPARISON:  CT abdomen dated 02/15/2009. Chest x-ray from earlier today. FINDINGS: CT CHEST FINDINGS Cardiovascular: Heart size is normal. Thoracic aorta appears intact and normal in caliber throughout. Scattered atherosclerotic changes of the thoracic aorta. Coronary artery calcifications noted. Mediastinum/Nodes: No hemorrhage or edema appreciated within the mediastinum. Scattered small lymph nodes within the mediastinum. Trachea and esophagus are unremarkable. Lungs/Pleura: Calcified pleural plaques noted bilaterally. Associated soft  tissue thickening at the sites of the pleural plaques, without asymmetrically prominent or irregular soft tissue mass at any site. Mild atelectasis and small pleural effusion on the left. No pneumothorax seen, although there are small foci of air within the lower left epicardial fat, presumably related to occult tiny pneumothorax. Musculoskeletal: There are multiple displaced fractures throughout the left ribs. This includes slightly displaced fractures within the lateral left fourth through ninth ribs. More significantly displaced fractures are seen within the posterior left fourth, sixth, seventh, eighth, and ninth ribs. Additional minimally displaced fracture noted within the posterior left tenth rib. CT ABDOMEN PELVIS FINDINGS Hepatobiliary: Liver and gallbladder appear normal. Small amount of free fluid noted adjacent to the liver. Pancreas: Unremarkable. No pancreatic ductal dilatation or surrounding inflammatory changes. Spleen: Spleen appears normal. Small amount of hyperdense free fluid adjacent to the spleen. Adrenals/Urinary Tract: Adrenal glands appear normal. Bilateral renal cysts. Kidneys otherwise unremarkable. No ureteral or bladder calculi  identified. Mild fluid stranding noted within the left retroperitoneum, below the left kidney. Stomach/Bowel: Bowel is normal in caliber. No bowel wall thickening or evidence of bowel wall inflammation. Appendix is normal. Stomach appears normal. Vascular/Lymphatic: Aortic atherosclerosis. No acute appearing vascular abnormality. No enlarged lymph nodes seen. Reproductive: Mild prostate gland enlargement. Other: Small amount of additional hyperdense free fluid in the lower pelvis, likely blood products. No active hemorrhage/contrast extravasation identified. Musculoskeletal: Degenerative changes throughout the thoracolumbar spine, mild to moderate in degree. No osseous fracture or dislocation identified in the abdomen or pelvis. Superficial soft tissues are unremarkable. CT THORACIC SPINE: Alignment: Mild scoliosis.  No acute subluxation. Vertebrae: No fracture line or displaced fracture fragment identified. Facets appear intact and normally aligned throughout. Paraspinal and other soft tissues: The immediate paravertebral soft tissues are unremarkable. No evidence of central canal hematoma. Disc levels: No significant degenerative change at any level. No significant central canal stenosis. IMPRESSION: 1. Multiple displaced fractures of the left ribs, as detailed above, including fractures within both the lateral and posterior portions of the fourth, sixth, seventh, eighth and ninth ribs (most significant fracture displacement within the posterior ribs). This certainly places the patient at risk for development of flail chest syndrome. 2. No discrete pneumothorax seen. There are, however, 2 punctate foci of air within the lower left epicardial fat which likely indicates an occult tiny pneumothorax on the left. Also associated mild atelectasis and small effusion at the left lung base. 3. Small amount of hyperdense free fluid, compatible with acute hemorrhage, in the upper abdomen and lower pelvis. No source for the  hemorrhage identified. No solid organ laceration identified. Suspect occult surface laceration of the spleen versus mesenteric venous bleed. No active hemorrhage/contrast extravasation identified on this exam. 4. No additional acute appearing findings within the chest, abdomen or pelvis. 5. No fracture or acute subluxation within the thoracic spine. 6. 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 7. Aortic atherosclerosis. 8. Additional chronic/incidental findings detailed above. Critical Value/emergent results were called by telephone at the time of interpretation on 02/16/2016 at 4:43 pm to Dr. Eula Listen , who verbally acknowledged these results. Electronically Signed   By: Franki Cabot M.D.   On: 02/16/2016 16:47   Ct Cervical Spine Wo Contrast  Result Date: 02/16/2016 CLINICAL DATA:  Unwitnessed fall at church hitting back of head. History dementia. EXAM: CT HEAD WITHOUT CONTRAST CT CERVICAL SPINE WITHOUT CONTRAST TECHNIQUE: Multidetector CT imaging of the head and cervical spine was performed following the standard protocol without intravenous contrast. Multiplanar CT  image reconstructions of the cervical spine were also generated. COMPARISON:  None. FINDINGS: CT HEAD FINDINGS Brain: Age advanced atrophy with sulcal prominence and centralized volume loss with commensurate ex vacuo dilatation of the ventricular system. Scattered periventricular hypodensities compatible with microvascular ischemic disease. Given background parenchymal abnormalities, there is no CT evidence of superimposed acute large territory infarct. No intraparenchymal or extra-axial mass or hemorrhage. Normal configuration of the ventricles and basilar cisterns. No midline shift. Vascular: No hyperdense vessel or unexpected calcification. Skull: No displaced calvarial fracture with special attention paid to the high right posterior parietal calvarium. Sinuses/Orbits: Limited visualization the  paranasal sinuses and mastoid air cells is normal. No air-fluid levels. Post bilateral cataract surgery. Other: Scattered punctate foci of subcutaneous emphysema are noted about the high right posterior parietal calvarium (representative images 29 and 30, series 2) with minimal amount of adjacent subcutaneous edema. No radiopaque foreign body. CT CERVICAL SPINE FINDINGS Alignment: C1 to the superior endplate of T2 is imaged. Normal alignment of the cervical spine. No anterolisthesis or retrolisthesis. The bilateral facets are normally aligned. Skull base and vertebrae: The dens is normally positioned between the lateral masses of C1. Moderate degenerative change of the atlantodental articulation with several adjacent punctate ossicles. Normal atlanto axial articulations. Soft tissues and spinal canal: Prevertebral soft tissues are normal. Disc levels: Mild-to-moderate multilevel cervical spine DDD, worse at C3-C4 with disc space height loss, endplate irregularity and sclerosis. Upper chest: Limited visualization of the lung apices is normal. Other: Calcified atherosclerotic plaque within the bilateral carotid bulbs. There is mild diffuse heterogeneity of thyroid parenchyma. No bulky cervical lymphadenopathy on this noncontrast examination. IMPRESSION: 1. Suspected laceration about the high right posterior parietal calvarium without associated radiopaque foreign body, displaced calvarial fracture or acute intracranial process. 2. No fracture or static subluxation of the cervical spine. 3. Age advanced atrophy and mild microvascular ischemic disease. 4. Mild to moderate multilevel cervical spine DDD, worse at C3-C4. Electronically Signed   By: Sandi Mariscal M.D.   On: 02/16/2016 13:27   Ct Thoracic Spine Wo Contrast  Result Date: 02/16/2016 CLINICAL DATA:  Pt arrived to ED after an unwitnessed fall at church. Per EMS the pt fell 89f from church stage and hit his head. Pt also had c/o of left side rib pain. EXAM: CT  CHEST, ABDOMEN, AND PELVIS WITH CONTRAST CT THORACIC SPINE without contrast TECHNIQUE: Multidetector CT imaging of the chest, abdomen and pelvis was performed following the standard protocol during bolus administration of intravenous contrast. Multi detector CT images of the thoracic spine were performed with standard reconstructions. CONTRAST:  1028mISOVUE-300 IOPAMIDOL (ISOVUE-300) INJECTION 61% COMPARISON:  CT abdomen dated 02/15/2009. Chest x-ray from earlier today. FINDINGS: CT CHEST FINDINGS Cardiovascular: Heart size is normal. Thoracic aorta appears intact and normal in caliber throughout. Scattered atherosclerotic changes of the thoracic aorta. Coronary artery calcifications noted. Mediastinum/Nodes: No hemorrhage or edema appreciated within the mediastinum. Scattered small lymph nodes within the mediastinum. Trachea and esophagus are unremarkable. Lungs/Pleura: Calcified pleural plaques noted bilaterally. Associated soft tissue thickening at the sites of the pleural plaques, without asymmetrically prominent or irregular soft tissue mass at any site. Mild atelectasis and small pleural effusion on the left. No pneumothorax seen, although there are small foci of air within the lower left epicardial fat, presumably related to occult tiny pneumothorax. Musculoskeletal: There are multiple displaced fractures throughout the left ribs. This includes slightly displaced fractures within the lateral left fourth through ninth ribs. More significantly displaced fractures are seen within the  posterior left fourth, sixth, seventh, eighth, and ninth ribs. Additional minimally displaced fracture noted within the posterior left tenth rib. CT ABDOMEN PELVIS FINDINGS Hepatobiliary: Liver and gallbladder appear normal. Small amount of free fluid noted adjacent to the liver. Pancreas: Unremarkable. No pancreatic ductal dilatation or surrounding inflammatory changes. Spleen: Spleen appears normal. Small amount of hyperdense  free fluid adjacent to the spleen. Adrenals/Urinary Tract: Adrenal glands appear normal. Bilateral renal cysts. Kidneys otherwise unremarkable. No ureteral or bladder calculi identified. Mild fluid stranding noted within the left retroperitoneum, below the left kidney. Stomach/Bowel: Bowel is normal in caliber. No bowel wall thickening or evidence of bowel wall inflammation. Appendix is normal. Stomach appears normal. Vascular/Lymphatic: Aortic atherosclerosis. No acute appearing vascular abnormality. No enlarged lymph nodes seen. Reproductive: Mild prostate gland enlargement. Other: Small amount of additional hyperdense free fluid in the lower pelvis, likely blood products. No active hemorrhage/contrast extravasation identified. Musculoskeletal: Degenerative changes throughout the thoracolumbar spine, mild to moderate in degree. No osseous fracture or dislocation identified in the abdomen or pelvis. Superficial soft tissues are unremarkable. CT THORACIC SPINE: Alignment: Mild scoliosis.  No acute subluxation. Vertebrae: No fracture line or displaced fracture fragment identified. Facets appear intact and normally aligned throughout. Paraspinal and other soft tissues: The immediate paravertebral soft tissues are unremarkable. No evidence of central canal hematoma. Disc levels: No significant degenerative change at any level. No significant central canal stenosis. IMPRESSION: 1. Multiple displaced fractures of the left ribs, as detailed above, including fractures within both the lateral and posterior portions of the fourth, sixth, seventh, eighth and ninth ribs (most significant fracture displacement within the posterior ribs). This certainly places the patient at risk for development of flail chest syndrome. 2. No discrete pneumothorax seen. There are, however, 2 punctate foci of air within the lower left epicardial fat which likely indicates an occult tiny pneumothorax on the left. Also associated mild atelectasis  and small effusion at the left lung base. 3. Small amount of hyperdense free fluid, compatible with acute hemorrhage, in the upper abdomen and lower pelvis. No source for the hemorrhage identified. No solid organ laceration identified. Suspect occult surface laceration of the spleen versus mesenteric venous bleed. No active hemorrhage/contrast extravasation identified on this exam. 4. No additional acute appearing findings within the chest, abdomen or pelvis. 5. No fracture or acute subluxation within the thoracic spine. 6. 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 7. Aortic atherosclerosis. 8. Additional chronic/incidental findings detailed above. Critical Value/emergent results were called by telephone at the time of interpretation on 02/16/2016 at 4:43 pm to Dr. Eula Listen , who verbally acknowledged these results. Electronically Signed   By: Franki Cabot M.D.   On: 02/16/2016 16:47   Ct Abdomen Pelvis W Contrast  Result Date: 02/16/2016 CLINICAL DATA:  Pt arrived to ED after an unwitnessed fall at church. Per EMS the pt fell 19f from church stage and hit his head. Pt also had c/o of left side rib pain. EXAM: CT CHEST, ABDOMEN, AND PELVIS WITH CONTRAST CT THORACIC SPINE without contrast TECHNIQUE: Multidetector CT imaging of the chest, abdomen and pelvis was performed following the standard protocol during bolus administration of intravenous contrast. Multi detector CT images of the thoracic spine were performed with standard reconstructions. CONTRAST:  1034mISOVUE-300 IOPAMIDOL (ISOVUE-300) INJECTION 61% COMPARISON:  CT abdomen dated 02/15/2009. Chest x-ray from earlier today. FINDINGS: CT CHEST FINDINGS Cardiovascular: Heart size is normal. Thoracic aorta appears intact and normal in caliber throughout. Scattered atherosclerotic changes of the thoracic  aorta. Coronary artery calcifications noted. Mediastinum/Nodes: No hemorrhage or edema appreciated within the  mediastinum. Scattered small lymph nodes within the mediastinum. Trachea and esophagus are unremarkable. Lungs/Pleura: Calcified pleural plaques noted bilaterally. Associated soft tissue thickening at the sites of the pleural plaques, without asymmetrically prominent or irregular soft tissue mass at any site. Mild atelectasis and small pleural effusion on the left. No pneumothorax seen, although there are small foci of air within the lower left epicardial fat, presumably related to occult tiny pneumothorax. Musculoskeletal: There are multiple displaced fractures throughout the left ribs. This includes slightly displaced fractures within the lateral left fourth through ninth ribs. More significantly displaced fractures are seen within the posterior left fourth, sixth, seventh, eighth, and ninth ribs. Additional minimally displaced fracture noted within the posterior left tenth rib. CT ABDOMEN PELVIS FINDINGS Hepatobiliary: Liver and gallbladder appear normal. Small amount of free fluid noted adjacent to the liver. Pancreas: Unremarkable. No pancreatic ductal dilatation or surrounding inflammatory changes. Spleen: Spleen appears normal. Small amount of hyperdense free fluid adjacent to the spleen. Adrenals/Urinary Tract: Adrenal glands appear normal. Bilateral renal cysts. Kidneys otherwise unremarkable. No ureteral or bladder calculi identified. Mild fluid stranding noted within the left retroperitoneum, below the left kidney. Stomach/Bowel: Bowel is normal in caliber. No bowel wall thickening or evidence of bowel wall inflammation. Appendix is normal. Stomach appears normal. Vascular/Lymphatic: Aortic atherosclerosis. No acute appearing vascular abnormality. No enlarged lymph nodes seen. Reproductive: Mild prostate gland enlargement. Other: Small amount of additional hyperdense free fluid in the lower pelvis, likely blood products. No active hemorrhage/contrast extravasation identified. Musculoskeletal: Degenerative  changes throughout the thoracolumbar spine, mild to moderate in degree. No osseous fracture or dislocation identified in the abdomen or pelvis. Superficial soft tissues are unremarkable. CT THORACIC SPINE: Alignment: Mild scoliosis.  No acute subluxation. Vertebrae: No fracture line or displaced fracture fragment identified. Facets appear intact and normally aligned throughout. Paraspinal and other soft tissues: The immediate paravertebral soft tissues are unremarkable. No evidence of central canal hematoma. Disc levels: No significant degenerative change at any level. No significant central canal stenosis. IMPRESSION: 1. Multiple displaced fractures of the left ribs, as detailed above, including fractures within both the lateral and posterior portions of the fourth, sixth, seventh, eighth and ninth ribs (most significant fracture displacement within the posterior ribs). This certainly places the patient at risk for development of flail chest syndrome. 2. No discrete pneumothorax seen. There are, however, 2 punctate foci of air within the lower left epicardial fat which likely indicates an occult tiny pneumothorax on the left. Also associated mild atelectasis and small effusion at the left lung base. 3. Small amount of hyperdense free fluid, compatible with acute hemorrhage, in the upper abdomen and lower pelvis. No source for the hemorrhage identified. No solid organ laceration identified. Suspect occult surface laceration of the spleen versus mesenteric venous bleed. No active hemorrhage/contrast extravasation identified on this exam. 4. No additional acute appearing findings within the chest, abdomen or pelvis. 5. No fracture or acute subluxation within the thoracic spine. 6. 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 7. Aortic atherosclerosis. 8. Additional chronic/incidental findings detailed above. Critical Value/emergent results were called by telephone at the time of  interpretation on 02/16/2016 at 4:43 pm to Dr. Eula Listen , who verbally acknowledged these results. Electronically Signed   By: Franki Cabot M.D.   On: 02/16/2016 16:47    Review of Systems  Unable to perform ROS: Dementia    Blood pressure 155/69, pulse 74,  temperature 97.7 F (36.5 C), temperature source Oral, resp. rate 20, SpO2 98 %. Physical Exam  Vitals reviewed. Constitutional: He appears well-developed and well-nourished. No distress.  HENT:  Head: Normocephalic. Head is with laceration.    Right Ear: External ear normal.  Left Ear: External ear normal.  L scalp lac s/p staple  Eyes: Conjunctivae and EOM are normal. Pupils are equal, round, and reactive to light. No scleral icterus.  Neck: Normal range of motion. Neck supple. No tracheal deviation present.  Cardiovascular: Normal rate, normal heart sounds and intact distal pulses.   Respiratory: Effort normal and breath sounds normal. No stridor. No respiratory distress. He has no wheezes. He exhibits tenderness (left; no evidence of flail).  GI: Soft. He exhibits no distension. There is no tenderness. There is no rebound and no guarding.  Genitourinary: Penis normal.  Musculoskeletal: Normal range of motion. He exhibits no edema or tenderness.  No midline back TTP, or stepoffs.   Lymphadenopathy:    He has no cervical adenopathy.  Neurological: He is alert. He has normal strength. No cranial nerve deficit or sensory deficit. He exhibits normal muscle tone. GCS eye subscore is 4. GCS verbal subscore is 4. GCS motor subscore is 6.  Pleasantly confused. Doesn't know president, month, year. But does know [place], [place]  Skin: Skin is warm and dry. Bruising and ecchymosis noted. No rash noted. He is not diaphoretic. No erythema. No pallor.  Psychiatric: He has a normal mood and affect. His behavior is normal.     Assessment/Plan Fall Left scalp laceration Left fourth through ninth rib fractures with small  pneumothorax Hemoperitoneum Probable splenic lac Mildly elevated creatinine Chronic sacral pain Perineal hernia Dementia Hypertension Scattered contusions Of surgery sleep apnea on C Pap  Admit stepdown unit Aggressive pulmonary toilet, pain control for rib fractures, bed rest today, Hold chemical DVT prophylaxis due to hemoperitoneum, SCDs only Twice a day CBCs 48 hours Repeat chest x-ray in the morning Repeat labs in the morning to monitor creatinine  Not sure what to make of elevated white blood cell count. I doubt his hemoglobin baseline is around 13. There are some ketones on his urinalysis. He may be a little bit dehydrated.    will need eventual PT and OT therapies  Discuss treatment plan with wife and granddaughter. Advised them that his dementia could worsen during an acute hospitalization. Also advised them that the pulmonary status could worsen as well  Leighton Ruff. Redmond Pulling, MD, FACS General, Bariatric, & Minimally Invasive Surgery [facility], [place]   [facility] M 02/16/2016, 7:17 PM   Procedures

## 2016-02-16 NOTE — ED Notes (Signed)
Aspen collar in place and aligned, pt and wife provided with snacks.

## 2016-02-16 NOTE — ED Provider Notes (Addendum)
[facility] Emergency Department Provider Note  ____________________________________________  Time seen: Approximately 1:48 PM  I have reviewed the triage vital signs and the nursing notes.   HISTORY  Chief Complaint Fall    HPI [name] is a 81 y.o. male with a history of Alzheimer's dementia, A. fib on low-dose aspirin every other day, HTN, HL presenting for a fall.  The patient was at church prior to arrival and "did not see" the edge of a 4 foot tall stage and fell off. He did hit his head but did not lose consciousness and does not have any significant headache, nausea or vomiting, numbness tingling or weakness. His pain is in the left upper quadrant and left lateral chest wall. He denies any associated shortness of breath, palpitations, syncope. He has chronic low back pain but at this time complains of mid back pain.   Past Medical History:  Diagnosis Date  . Absolute anemia 09/11/2014  . Anxiety   . Atrial fibrillation ([place]) 01/23/2014  . BP (high blood pressure) 11/07/2013  . Carotid artery disease ([place]) 09/11/2014  . Complete rotator cuff rupture of left shoulder 12/30/2013  . Dementia 09/11/2014  . Depression   . Heart murmur   . HLD (hyperlipidemia) 01/23/2014  . Hypertension   . Obstructive apnea 11/07/2013  . Personal history of other diseases of the circulatory system 01/23/2014  . Sleep apnea   . 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 Mild dementia 04/10/2015  . Aggrieved 03/02/2015  . Absolute anemia 09/11/2014  . Carotid artery disease ([place]) 09/11/2014  . Dementia 09/11/2014  . History of repair of rotator cuff 03/02/2014  . Atrial fibrillation ([place]) 01/23/2014  . Personal history of other diseases of the circulatory system 01/23/2014  . HLD (hyperlipidemia) 01/23/2014  . Complete rotator cuff rupture of left shoulder 12/30/2013  . BP (high blood pressure) 11/07/2013   . Amnesia 11/07/2013  . Obstructive apnea 11/07/2013    Past Surgical History:  Procedure Laterality Date  . Back Surgery- Tumor Removal    . SHOULDER SURGERY Right 2007    Current Outpatient Rx  . Order #: [id number] Class: Historical Med  . Order #: [id number] Class: Historical Med  . Order #: [id number] Class: Historical Med  . Order #: [id number] Class: Historical Med  . Order #: [id number] Class: Historical Med  . Order #: [id number] Class: Historical Med  . Order #: [id number] Class: Historical Med  . Order #: [id number] Class: Historical Med  . Order #: [id number] Class: Historical Med  . Order #: [id number] Class: Historical Med  . Order #: [id number] Class: Normal    Allergies Soybean oil  Family History  Problem Relation Age of Onset  . Bladder Cancer Neg Hx   . Prostate cancer Neg Hx   . Kidney cancer Neg Hx     Social History Social History  Substance Use Topics  . Smoking status: Former Research scientist (life sciences)  . Smokeless tobacco: Never Used  . Alcohol use No    Review of Systems Constitutional: No fever/chills.No lightheadedness or syncope. Positive fall. No loss of consciousness. Eyes: No visual changes. No blurred or double vision. ENT: No sore throat. No congestion or rhinorrhea. HEAD: Positive scalp laceration Cardiovascular: Positive left lateral chest wall pain. Denies palpitations. Respiratory: Denies shortness of breath.  No cough. Gastrointestinal: Positive left upper quadrant abdominal pain.  No nausea, no vomiting.  No diarrhea.  No constipation. Genitourinary: Negative for dysuria. Musculoskeletal: Positive  for mid back and lower lumbar pain. No other perceived pain in any of the focal joints. Skin: Negative for rash. Neurological: Negative for headaches. No focal numbness, tingling or weakness. No blurred or double vision. No changes in mental status. No changes in speech.  10-point ROS otherwise negative.  ____________________________________________   PHYSICAL  EXAM:  VITAL SIGNS: ED Triage Vitals  Enc Vitals Group     BP 02/16/16 1137 (!) 131/49     Pulse Rate 02/16/16 1137 60     Resp 02/16/16 1137 20     Temp 02/16/16 1137 97.8 F (36.6 C)     Temp Source 02/16/16 1137 Oral     SpO2 02/16/16 1137 100 %     Weight 02/16/16 1132 168 lb (76.2 kg)     Height 02/16/16 1132 5\' 10"  (1.778 m)     Head Circumference --      Peak Flow --      Pain Score 02/16/16 1141 9     Pain Loc --      Pain Edu? --      Excl. in [place]? --     Constitutional: Patient is alert and able to answer some questions appropriately. He does have baseline dementia. He is uncomfortable but nontoxic in appearance. Eyes: Conjunctivae are normal.  EOMI. PERRLA No scleral icterus. No raccoon eyes. Head: 2 cm linear superficial laceration over the right scalp. 6 cm mildly jagged laceration down to the gale[place] over the left posterior scalp. No Battle sign. No midface instability. Nose: No congestion/rhinnorhea. No swelling of the nose. No septal hematoma. Mouth/Throat: Mucous membranes are moist. No dental injury or malocclusion. Neck: No stridor.  Patient is in a cervical collar that is clinically removed by me with full range of motion without pain, after CT cervical spine imaging is complete. No midline C-spine tenderness to palpation, step-offs or deformities. Cardiovascular: Normal rate, irregular rhythm. No murmurs, rubs or gallops.  Respiratory: Normal respiratory effort.  No accessory muscle use or retractions. Lungs CTAB.  No wheezes, rales or ronchi. Gastrointestinal: Soft and nondistended.  Positive left upper quadrant tenderness to palpation area and no overlying bruising. No guarding or rebound.  No peritoneal signs. Musculoskeletal: Pelvis is stable. Full range of motion of the bilateral ankles, knees, hips without pain. Bilateral range of motion of the wrists, elbows, and shoulders without pain. Some mild ecchymosis with swelling over the left elbow. Neurologic:   Alert. GCS 15. Able to answer basic questions.Marland Kitchen  Speech is clear.  Face and smile are symmetric.  EOMI.  PERRLA. Moves all extremities well. Skin:  Skin is warm, dry. No rash noted. Psychiatric: Mood and affect are normal. Speech and behavior are normal.  Normal judgement.  ____________________________________________   LABS (all labs ordered are listed, but only abnormal results are displayed)  Labs Reviewed  CBC - Abnormal; Notable for the following:       Result Value   WBC 16.6 (*)    RBC 4.17 (*)    HCT 39.2 (*)    Platelets 144 (*)    All other components within normal limits  BASIC METABOLIC PANEL - Abnormal; Notable for the following:    Glucose, Bld 131 (*)    BUN 27 (*)    Creatinine, Ser 1.38 (*)    GFR calc non Af Amer 47 (*)    GFR calc Af Amer 55 (*)    All other components within normal limits  URINALYSIS COMPLETEWITH MICROSCOPIC ([facility] ONLY)  APTT  PROTIME-INR   ____________________________________________  EKG  ED ECG REPORT I, Eula Listen, the attending physician, personally viewed and interpreted this ECG.   Date: 02/16/2016  EKG Time: 1132  Rate: 64  Rhythm: atrial fibrillation with right bundle branch block.  Axis: normal  Intervals:none  ST&T Change: Nonspecific T-wave inversion in V1, V3. No ST elevation.  ____________________________________________  [id number]  Dg Chest 1 View  Result Date: 02/16/2016 CLINICAL DATA:  Fall EXAM: CHEST 1 VIEW COMPARISON:  Chest x-ray dated 04/20/2013. FINDINGS: Heart size and mediastinal contours are stable. Atherosclerotic calcifications again noted at the aortic arch. Calcified pleural plaques again noted bilaterally. Lungs otherwise clear. No new lung findings. No pleural effusion or pneumothorax seen. There is a slightly displaced fracture of the left lateral eighth rib. There is an additional slightly displaced fracture of the left posterior fourth rib. IMPRESSION: 1. Slightly displaced  acute-appearing fractures of the posterior left fourth rib and lateral left eighth rib. 2. 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 No new lung findings. No pleural effusion or pneumothorax seen. 3. Aortic atherosclerosis. Electronically Signed   By: Franki Cabot M.D.   On: 02/16/2016 14:23   Dg Elbow Complete Left  Result Date: 02/16/2016 CLINICAL DATA:  Pain after fall. EXAM: LEFT ELBOW - COMPLETE 3+ VIEW COMPARISON:  None. FINDINGS: There is no evidence of fracture, dislocation, or joint effusion. There is no evidence of arthropathy or other focal bone abnormality. Soft tissues are unremarkable. IMPRESSION: Negative. Electronically Signed   By: Dorise Bullion III M.D   On: 02/16/2016 14:21   Ct Head Wo Contrast  Result Date: 02/16/2016 CLINICAL DATA:  Unwitnessed fall at church hitting back of head. History dementia. EXAM: CT HEAD WITHOUT CONTRAST CT CERVICAL SPINE WITHOUT CONTRAST TECHNIQUE: Multidetector CT imaging of the head and cervical spine was performed following the standard protocol without intravenous contrast. Multiplanar CT image reconstructions of the cervical spine were also generated. COMPARISON:  None. FINDINGS: CT HEAD FINDINGS Brain: Age advanced atrophy with sulcal prominence and centralized volume loss with commensurate ex vacuo dilatation of the ventricular system. Scattered periventricular hypodensities compatible with microvascular ischemic disease. Given background parenchymal abnormalities, there is no CT evidence of superimposed acute large territory infarct. No intraparenchymal or extra-axial mass or hemorrhage. Normal configuration of the ventricles and basilar cisterns. No midline shift. Vascular: No hyperdense vessel or unexpected calcification. Skull: No displaced calvarial fracture with special attention paid to the high right posterior parietal calvarium. Sinuses/Orbits: Limited visualization the paranasal sinuses and mastoid air cells  is normal. No air-fluid levels. Post bilateral cataract surgery. Other: Scattered punctate foci of subcutaneous emphysema are noted about the high right posterior parietal calvarium (representative images 29 and 30, series 2) with minimal amount of adjacent subcutaneous edema. No radiopaque foreign body. CT CERVICAL SPINE FINDINGS Alignment: C1 to the superior endplate of T2 is imaged. Normal alignment of the cervical spine. No anterolisthesis or retrolisthesis. The bilateral facets are normally aligned. Skull base and vertebrae: The dens is normally positioned between the lateral masses of C1. Moderate degenerative change of the atlantodental articulation with several adjacent punctate ossicles. Normal atlanto axial articulations. Soft tissues and spinal canal: Prevertebral soft tissues are normal. Disc levels: Mild-to-moderate multilevel cervical spine DDD, worse at C3-C4 with disc space height loss, endplate irregularity and sclerosis. Upper chest: Limited visualization of the lung apices is normal. Other: Calcified atherosclerotic plaque within the bilateral carotid bulbs. There is mild diffuse heterogeneity of thyroid parenchyma. No bulky cervical lymphadenopathy on this  noncontrast examination. IMPRESSION: 1. Suspected laceration about the high right posterior parietal calvarium without associated radiopaque foreign body, displaced calvarial fracture or acute intracranial process. 2. No fracture or static subluxation of the cervical spine. 3. Age advanced atrophy and mild microvascular ischemic disease. 4. Mild to moderate multilevel cervical spine DDD, worse at C3-C4. Electronically Signed   By: Sandi Mariscal M.D.   On: 02/16/2016 13:27   Ct Chest W Contrast  Result Date: 02/16/2016 CLINICAL DATA:  Pt arrived to ED after an unwitnessed fall at church. Per EMS the pt fell 61ft from church stage and hit his head. Pt also had c/o of left side rib pain. EXAM: CT CHEST, ABDOMEN, AND PELVIS WITH CONTRAST CT  THORACIC SPINE without contrast TECHNIQUE: Multidetector CT imaging of the chest, abdomen and pelvis was performed following the standard protocol during bolus administration of intravenous contrast. Multi detector CT images of the thoracic spine were performed with standard reconstructions. CONTRAST:  162mL ISOVUE-300 IOPAMIDOL (ISOVUE-300) INJECTION 61% COMPARISON:  CT abdomen dated 02/15/2009. Chest x-ray from earlier today. FINDINGS: CT CHEST FINDINGS Cardiovascular: Heart size is normal. Thoracic aorta appears intact and normal in caliber throughout. Scattered atherosclerotic changes of the thoracic aorta. Coronary artery calcifications noted. Mediastinum/Nodes: No hemorrhage or edema appreciated within the mediastinum. Scattered small lymph nodes within the mediastinum. Trachea and esophagus are unremarkable. Lungs/Pleura: Calcified pleural plaques noted bilaterally. Associated soft tissue thickening at the sites of the pleural plaques, without asymmetrically prominent or irregular soft tissue mass at any site. Mild atelectasis and small pleural effusion on the left. No pneumothorax seen, although there are small foci of air within the lower left epicardial fat, presumably related to occult tiny pneumothorax. Musculoskeletal: There are multiple displaced fractures throughout the left ribs. This includes slightly displaced fractures within the lateral left fourth through ninth ribs. More significantly displaced fractures are seen within the posterior left fourth, sixth, seventh, eighth, and ninth ribs. Additional minimally displaced fracture noted within the posterior left tenth rib. CT ABDOMEN PELVIS FINDINGS Hepatobiliary: Liver and gallbladder appear normal. Small amount of free fluid noted adjacent to the liver. Pancreas: Unremarkable. No pancreatic ductal dilatation or surrounding inflammatory changes. Spleen: Spleen appears normal. Small amount of hyperdense free fluid adjacent to the spleen.  Adrenals/Urinary Tract: Adrenal glands appear normal. Bilateral renal cysts. Kidneys otherwise unremarkable. No ureteral or bladder calculi identified. Mild fluid stranding noted within the left retroperitoneum, below the left kidney. Stomach/Bowel: Bowel is normal in caliber. No bowel wall thickening or evidence of bowel wall inflammation. Appendix is normal. Stomach appears normal. Vascular/Lymphatic: Aortic atherosclerosis. No acute appearing vascular abnormality. No enlarged lymph nodes seen. Reproductive: Mild prostate gland enlargement. Other: Small amount of additional hyperdense free fluid in the lower pelvis, likely blood products. No active hemorrhage/contrast extravasation identified. Musculoskeletal: Degenerative changes throughout the thoracolumbar spine, mild to moderate in degree. No osseous fracture or dislocation identified in the abdomen or pelvis. Superficial soft tissues are unremarkable. CT THORACIC SPINE: Alignment: Mild scoliosis.  No acute subluxation. Vertebrae: No fracture line or displaced fracture fragment identified. Facets appear intact and normally aligned throughout. Paraspinal and other soft tissues: The immediate paravertebral soft tissues are unremarkable. No evidence of central canal hematoma. Disc levels: No significant degenerative change at any level. No significant central canal stenosis. IMPRESSION: 1. Multiple displaced fractures of the left ribs, as detailed above, including fractures within both the lateral and posterior portions of the fourth, sixth, seventh, eighth and ninth ribs (most significant fracture displacement within the posterior  ribs). This certainly places the patient at risk for development of flail chest syndrome. 2. No discrete pneumothorax seen. There are, however, 2 punctate foci of air within the lower left epicardial fat which likely indicates an occult tiny pneumothorax on the left. Also associated mild atelectasis and small effusion at the left lung  base. 3. Small amount of hyperdense free fluid, compatible with acute hemorrhage, in the upper abdomen and lower pelvis. No source for the hemorrhage identified. No solid organ laceration identified. Suspect occult surface laceration of the spleen versus mesenteric venous bleed. No active hemorrhage/contrast extravasation identified on this exam. 4. No additional acute appearing findings within the chest, abdomen or pelvis. 5. No fracture or acute subluxation within the thoracic spine. 6. 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 7. Aortic atherosclerosis. 8. Additional chronic/incidental findings detailed above. Critical Value/emergent results were called by telephone at the time of interpretation on 02/16/2016 at 4:43 pm to Dr. Eula Listen , who verbally acknowledged these results. Electronically Signed   By: Franki Cabot M.D.   On: 02/16/2016 16:47   Ct Cervical Spine Wo Contrast  Result Date: 02/16/2016 CLINICAL DATA:  Unwitnessed fall at church hitting back of head. History dementia. EXAM: CT HEAD WITHOUT CONTRAST CT CERVICAL SPINE WITHOUT CONTRAST TECHNIQUE: Multidetector CT imaging of the head and cervical spine was performed following the standard protocol without intravenous contrast. Multiplanar CT image reconstructions of the cervical spine were also generated. COMPARISON:  None. FINDINGS: CT HEAD FINDINGS Brain: Age advanced atrophy with sulcal prominence and centralized volume loss with commensurate ex vacuo dilatation of the ventricular system. Scattered periventricular hypodensities compatible with microvascular ischemic disease. Given background parenchymal abnormalities, there is no CT evidence of superimposed acute large territory infarct. No intraparenchymal or extra-axial mass or hemorrhage. Normal configuration of the ventricles and basilar cisterns. No midline shift. Vascular: No hyperdense vessel or unexpected calcification. Skull: No displaced  calvarial fracture with special attention paid to the high right posterior parietal calvarium. Sinuses/Orbits: Limited visualization the paranasal sinuses and mastoid air cells is normal. No air-fluid levels. Post bilateral cataract surgery. Other: Scattered punctate foci of subcutaneous emphysema are noted about the high right posterior parietal calvarium (representative images 29 and 30, series 2) with minimal amount of adjacent subcutaneous edema. No radiopaque foreign body. CT CERVICAL SPINE FINDINGS Alignment: C1 to the superior endplate of T2 is imaged. Normal alignment of the cervical spine. No anterolisthesis or retrolisthesis. The bilateral facets are normally aligned. Skull base and vertebrae: The dens is normally positioned between the lateral masses of C1. Moderate degenerative change of the atlantodental articulation with several adjacent punctate ossicles. Normal atlanto axial articulations. Soft tissues and spinal canal: Prevertebral soft tissues are normal. Disc levels: Mild-to-moderate multilevel cervical spine DDD, worse at C3-C4 with disc space height loss, endplate irregularity and sclerosis. Upper chest: Limited visualization of the lung apices is normal. Other: Calcified atherosclerotic plaque within the bilateral carotid bulbs. There is mild diffuse heterogeneity of thyroid parenchyma. No bulky cervical lymphadenopathy on this noncontrast examination. IMPRESSION: 1. Suspected laceration about the high right posterior parietal calvarium without associated radiopaque foreign body, displaced calvarial fracture or acute intracranial process. 2. No fracture or static subluxation of the cervical spine. 3. Age advanced atrophy and mild microvascular ischemic disease. 4. Mild to moderate multilevel cervical spine DDD, worse at C3-C4. Electronically Signed   By: Sandi Mariscal M.D.   On: 02/16/2016 13:27   Ct Thoracic Spine Wo Contrast  Result Date: 02/16/2016 CLINICAL DATA:  Pt arrived to ED after  an unwitnessed fall at church. Per EMS the pt fell 61ft from church stage and hit his head. Pt also had c/o of left side rib pain. EXAM: CT CHEST, ABDOMEN, AND PELVIS WITH CONTRAST CT THORACIC SPINE without contrast TECHNIQUE: Multidetector CT imaging of the chest, abdomen and pelvis was performed following the standard protocol during bolus administration of intravenous contrast. Multi detector CT images of the thoracic spine were performed with standard reconstructions. CONTRAST:  179mL ISOVUE-300 IOPAMIDOL (ISOVUE-300) INJECTION 61% COMPARISON:  CT abdomen dated 02/15/2009. Chest x-ray from earlier today. FINDINGS: CT CHEST FINDINGS Cardiovascular: Heart size is normal. Thoracic aorta appears intact and normal in caliber throughout. Scattered atherosclerotic changes of the thoracic aorta. Coronary artery calcifications noted. Mediastinum/Nodes: No hemorrhage or edema appreciated within the mediastinum. Scattered small lymph nodes within the mediastinum. Trachea and esophagus are unremarkable. Lungs/Pleura: Calcified pleural plaques noted bilaterally. Associated soft tissue thickening at the sites of the pleural plaques, without asymmetrically prominent or irregular soft tissue mass at any site. Mild atelectasis and small pleural effusion on the left. No pneumothorax seen, although there are small foci of air within the lower left epicardial fat, presumably related to occult tiny pneumothorax. Musculoskeletal: There are multiple displaced fractures throughout the left ribs. This includes slightly displaced fractures within the lateral left fourth through ninth ribs. More significantly displaced fractures are seen within the posterior left fourth, sixth, seventh, eighth, and ninth ribs. Additional minimally displaced fracture noted within the posterior left tenth rib. CT ABDOMEN PELVIS FINDINGS Hepatobiliary: Liver and gallbladder appear normal. Small amount of free fluid noted adjacent to the liver. Pancreas:  Unremarkable. No pancreatic ductal dilatation or surrounding inflammatory changes. Spleen: Spleen appears normal. Small amount of hyperdense free fluid adjacent to the spleen. Adrenals/Urinary Tract: Adrenal glands appear normal. Bilateral renal cysts. Kidneys otherwise unremarkable. No ureteral or bladder calculi identified. Mild fluid stranding noted within the left retroperitoneum, below the left kidney. Stomach/Bowel: Bowel is normal in caliber. No bowel wall thickening or evidence of bowel wall inflammation. Appendix is normal. Stomach appears normal. Vascular/Lymphatic: Aortic atherosclerosis. No acute appearing vascular abnormality. No enlarged lymph nodes seen. Reproductive: Mild prostate gland enlargement. Other: Small amount of additional hyperdense free fluid in the lower pelvis, likely blood products. No active hemorrhage/contrast extravasation identified. Musculoskeletal: Degenerative changes throughout the thoracolumbar spine, mild to moderate in degree. No osseous fracture or dislocation identified in the abdomen or pelvis. Superficial soft tissues are unremarkable. CT THORACIC SPINE: Alignment: Mild scoliosis.  No acute subluxation. Vertebrae: No fracture line or displaced fracture fragment identified. Facets appear intact and normally aligned throughout. Paraspinal and other soft tissues: The immediate paravertebral soft tissues are unremarkable. No evidence of central canal hematoma. Disc levels: No significant degenerative change at any level. No significant central canal stenosis. IMPRESSION: 1. Multiple displaced fractures of the left ribs, as detailed above, including fractures within both the lateral and posterior portions of the fourth, sixth, seventh, eighth and ninth ribs (most significant fracture displacement within the posterior ribs). This certainly places the patient at risk for development of flail chest syndrome. 2. No discrete pneumothorax seen. There are, however, 2 punctate foci  of air within the lower left epicardial fat which likely indicates an occult tiny pneumothorax on the left. Also associated mild atelectasis and small effusion at the left lung base. 3. Small amount of hyperdense free fluid, compatible with acute hemorrhage, in the upper abdomen and lower pelvis. No source for the hemorrhage identified. No solid  organ laceration identified. Suspect occult surface laceration of the spleen versus mesenteric venous bleed. No active hemorrhage/contrast extravasation identified on this exam. 4. No additional acute appearing findings within the chest, abdomen or pelvis. 5. No fracture or acute subluxation within the thoracic spine. 6. 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 7. Aortic atherosclerosis. 8. Additional chronic/incidental findings detailed above. Critical Value/emergent results were called by telephone at the time of interpretation on 02/16/2016 at 4:43 pm to Dr. Eula Listen , who verbally acknowledged these results. Electronically Signed   By: Franki Cabot M.D.   On: 02/16/2016 16:47   Ct Abdomen Pelvis W Contrast  Result Date: 02/16/2016 CLINICAL DATA:  Pt arrived to ED after an unwitnessed fall at church. Per EMS the pt fell 61ft from church stage and hit his head. Pt also had c/o of left side rib pain. EXAM: CT CHEST, ABDOMEN, AND PELVIS WITH CONTRAST CT THORACIC SPINE without contrast TECHNIQUE: Multidetector CT imaging of the chest, abdomen and pelvis was performed following the standard protocol during bolus administration of intravenous contrast. Multi detector CT images of the thoracic spine were performed with standard reconstructions. CONTRAST:  142mL ISOVUE-300 IOPAMIDOL (ISOVUE-300) INJECTION 61% COMPARISON:  CT abdomen dated 02/15/2009. Chest x-ray from earlier today. FINDINGS: CT CHEST FINDINGS Cardiovascular: Heart size is normal. Thoracic aorta appears intact and normal in caliber throughout. Scattered  atherosclerotic changes of the thoracic aorta. Coronary artery calcifications noted. Mediastinum/Nodes: No hemorrhage or edema appreciated within the mediastinum. Scattered small lymph nodes within the mediastinum. Trachea and esophagus are unremarkable. Lungs/Pleura: Calcified pleural plaques noted bilaterally. Associated soft tissue thickening at the sites of the pleural plaques, without asymmetrically prominent or irregular soft tissue mass at any site. Mild atelectasis and small pleural effusion on the left. No pneumothorax seen, although there are small foci of air within the lower left epicardial fat, presumably related to occult tiny pneumothorax. Musculoskeletal: There are multiple displaced fractures throughout the left ribs. This includes slightly displaced fractures within the lateral left fourth through ninth ribs. More significantly displaced fractures are seen within the posterior left fourth, sixth, seventh, eighth, and ninth ribs. Additional minimally displaced fracture noted within the posterior left tenth rib. CT ABDOMEN PELVIS FINDINGS Hepatobiliary: Liver and gallbladder appear normal. Small amount of free fluid noted adjacent to the liver. Pancreas: Unremarkable. No pancreatic ductal dilatation or surrounding inflammatory changes. Spleen: Spleen appears normal. Small amount of hyperdense free fluid adjacent to the spleen. Adrenals/Urinary Tract: Adrenal glands appear normal. Bilateral renal cysts. Kidneys otherwise unremarkable. No ureteral or bladder calculi identified. Mild fluid stranding noted within the left retroperitoneum, below the left kidney. Stomach/Bowel: Bowel is normal in caliber. No bowel wall thickening or evidence of bowel wall inflammation. Appendix is normal. Stomach appears normal. Vascular/Lymphatic: Aortic atherosclerosis. No acute appearing vascular abnormality. No enlarged lymph nodes seen. Reproductive: Mild prostate gland enlargement. Other: Small amount of additional  hyperdense free fluid in the lower pelvis, likely blood products. No active hemorrhage/contrast extravasation identified. Musculoskeletal: Degenerative changes throughout the thoracolumbar spine, mild to moderate in degree. No osseous fracture or dislocation identified in the abdomen or pelvis. Superficial soft tissues are unremarkable. CT THORACIC SPINE: Alignment: Mild scoliosis.  No acute subluxation. Vertebrae: No fracture line or displaced fracture fragment identified. Facets appear intact and normally aligned throughout. Paraspinal and other soft tissues: The immediate paravertebral soft tissues are unremarkable. No evidence of central canal hematoma. Disc levels: No significant degenerative change at any level. No significant central canal stenosis. IMPRESSION: 1.  Multiple displaced fractures of the left ribs, as detailed above, including fractures within both the lateral and posterior portions of the fourth, sixth, seventh, eighth and ninth ribs (most significant fracture displacement within the posterior ribs). This certainly places the patient at risk for development of flail chest syndrome. 2. No discrete pneumothorax seen. There are, however, 2 punctate foci of air within the lower left epicardial fat which likely indicates an occult tiny pneumothorax on the left. Also associated mild atelectasis and small effusion at the left lung base. 3. Small amount of hyperdense free fluid, compatible with acute hemorrhage, in the upper abdomen and lower pelvis. No source for the hemorrhage identified. No solid organ laceration identified. Suspect occult surface laceration of the spleen versus mesenteric venous bleed. No active hemorrhage/contrast extravasation identified on this exam. 4. No additional acute appearing findings within the chest, abdomen or pelvis. 5. No fracture or acute subluxation within the thoracic spine. 6. 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 7. Aortic  atherosclerosis. 8. Additional chronic/incidental findings detailed above. Critical Value/emergent results were called by telephone at the time of interpretation on 02/16/2016 at 4:43 pm to Dr. Eula Listen , who verbally acknowledged these results. Electronically Signed   By: Franki Cabot M.D.   On: 02/16/2016 16:47    ____________________________________________   PROCEDURES  Procedure(s) performed: None  Procedures  Critical Care performed: Yes ____________________________________________   INITIAL IMPRESSION / ASSESSMENT AND PLAN / ED COURSE  Pertinent labs & imaging results that were available during my care of the patient were reviewed by me and considered in my medical decision making (see chart for details).  81 y.o. male with Alzheimer's dementia presenting with head injury, mid thoracic tenderness to palpation, lower lumbar tenderness, left elbow swelling, after a fall. The patient underwent CT head and C-spine from triage, and does not have any acute findings. He does have some lacerations on his scalp that will require repair. I am concerned about his left upper quadrant pain and left lateral chest wall pain, including possible splenic injury or rib fracture. He is breathing well and has good breath sounds bilaterally, which makes the likelihood of pneumothorax much else likely. The history is consistent with a mechanical fall rather than a syncopal or cardiac cause for his fall. I do not see any neurologic abnormalities on my examination. We'll do a full trauma workup and some screening tests for falls that would be nonmechanical, and reevaluate the patient for final disposition.  ----------------------------------------- 4:52 PM on 02/16/2016 -----------------------------------------  At this time, the patient remained hemodynamically stable and his mentation is unchanged. He states that his pain is slightly better in the left lateral chest wall. Unfortunately, have  been called by the radiologist with multiple positive findings from his trauma scans. He has multiple rib fractures, several of which occurred twice in the same rib which increases his risk for flail chest. He has 2 areas of punctate air in the chest, and while pneumothorax is not appreciated, these may be indicative of a small underlying pneumothorax. Additionally, in the abdomen, he has hemorrhage around the liver and spleen but no obvious extravasation. He also has some blood in the lower pelvis which is likely gravity dependent. Given the area of his rib fractures, a splenic laceration is most likely, but a liver laceration is not ruled out at this time.  The patient does not have any thoracic or lumbar spine fractures.  I have initiated a call to the [facility] transfer center,  for a trauma transfer for this patient.  CRITICAL CARE Performed by: Eula Listen   Total critical care time: 60 minutes  Critical care time was exclusive of separately billable procedures and treating other patients.  Critical care was necessary to treat or prevent imminent or life-threatening deterioration.  Critical care was time spent personally by me on the following activities: development of treatment plan with patient and/or surrogate as well as nursing, discussions with consultants, evaluation of patient's response to treatment, examination of patient, obtaining history from patient or surrogate, ordering and performing treatments and interventions, ordering and review of laboratory studies, ordering and review of radiographic studies, pulse oximetry and re-evaluation of patient's condition.  ----------------------------------------- 5:07 PM on 02/16/2016 -----------------------------------------  The patient has been accepted for transfer to the trauma team at [facility] and will be transported straight to the emergency department. ____________________________________________  FINAL CLINICAL  IMPRESSION(S) / ED DIAGNOSES  Final diagnoses:  Blunt trauma  Injury of intra-abdominal organ, initial encounter  Closed fracture of multiple ribs of left side, initial encounter  Traumatic pneumothorax, initial encounter    Clinical Course      NEW MEDICATIONS STARTED DURING THIS VISIT:  New Prescriptions   No medications on file      Eula Listen, MD 02/16/16 [id number]    Eula Listen, MD 02/16/16 1708

## 2016-02-16 NOTE — ED Notes (Signed)
Pt's lacs covered with non stick pad and head wrapped with gauze prior to transport.

## 2016-02-16 NOTE — ED Provider Notes (Signed)
  Physical Exam  BP 155/69 (BP Location: Right Arm)   Pulse 74   Temp 97.7 F (36.5 C) (Oral)   Resp 20   SpO2 98%   Physical Exam  ED Course  Procedures  MDM Patient transferred from [facility]. Patient fell backwards at church today. Trauma workup at [facility] showed multiple rib fractures, scalp laceration, possible occult pneumothorax, small blood in abdomen. Trauma accepted transfer. On arrival, A&O x 3, not hypotensive. Patient has nl neuro exam. L scalp laceration around 7 cm long. Laceration stapled by me. + crackles L lung with tenderness on palpation. Mild LUQ abdominal tenderness. I talked to Dr. Redmond Pulling, who will admit patient to trauma service.   LACERATION REPAIR Performed by: Wandra Arthurs Authorized by: Wandra Arthurs Consent: Verbal consent obtained. Risks and benefits: risks, benefits and alternatives were discussed Consent given by: patient Patient identity confirmed: provided demographic data Prepped and Draped in normal sterile fashion Wound explored  Laceration Location: L scalp  Laceration Length: 7 cm  No Foreign Bodies seen or palpated  Anesthesia: none  Irrigation method: syringe Amount of cleaning: standard  Skin closure: staples  Number of staples: 6  Patient tolerance: Patient tolerated the procedure well with no immediate complications.    Drenda Freeze, MD 02/16/16 [phone]

## 2016-02-16 NOTE — ED Notes (Signed)
Placed on hospital bed at this time 

## 2016-02-16 NOTE — ED Triage Notes (Signed)
Pt arrived to ED after [place] fall at [place]. Per EMS the pt fell 55ft from church stage and hit his head. Pt also had c/o of left side rib pain.

## 2016-02-17 ENCOUNTER — Encounter (HOSPITAL_COMMUNITY): Payer: Self-pay | Admitting: *Deleted

## 2016-02-17 ENCOUNTER — Inpatient Hospital Stay (HOSPITAL_COMMUNITY): Payer: PPO

## 2016-02-17 DIAGNOSIS — S36030A Superficial (capsular) laceration of spleen, initial encounter: Secondary | ICD-10-CM | POA: Diagnosis not present

## 2016-02-17 DIAGNOSIS — S2242XA Multiple fractures of ribs, left side, initial encounter for closed fracture: Secondary | ICD-10-CM | POA: Diagnosis not present

## 2016-02-17 DIAGNOSIS — F0390 Unspecified dementia without behavioral disturbance: Secondary | ICD-10-CM | POA: Diagnosis not present

## 2016-02-17 LAB — CBC
HCT: 28.2 % — ABNORMAL LOW (ref 39.0–52.0)
Hemoglobin: 9.5 g/dL — ABNORMAL LOW (ref 13.0–17.0)
MCH: 31.3 pg (ref 26.0–34.0)
MCHC: 33.7 g/dL (ref 30.0–36.0)
MCV: 92.8 fL (ref 78.0–100.0)
PLATELETS: 116 10*3/uL — AB (ref 150–400)
RBC: 3.04 MIL/uL — AB (ref 4.22–5.81)
RDW: 13 % (ref 11.5–15.5)
WBC: 8.5 10*3/uL (ref 4.0–10.5)

## 2016-02-17 LAB — COMPREHENSIVE METABOLIC PANEL
ALBUMIN: 2.9 g/dL — AB (ref 3.5–5.0)
ALT: 14 U/L — AB (ref 17–63)
AST: 27 U/L (ref 15–41)
Alkaline Phosphatase: 39 U/L (ref 38–126)
Anion gap: 4 — ABNORMAL LOW (ref 5–15)
BUN: 17 mg/dL (ref 6–20)
CHLORIDE: 113 mmol/L — AB (ref 101–111)
CO2: 23 mmol/L (ref 22–32)
CREATININE: 1.07 mg/dL (ref 0.61–1.24)
Calcium: 7.5 mg/dL — ABNORMAL LOW (ref 8.9–10.3)
GFR calc non Af Amer: >60 mL/min (ref >60)
GLUCOSE: 120 mg/dL — AB (ref 65–99)
Potassium: 4.1 mmol/L (ref 3.5–5.1)
SODIUM: 140 mmol/L (ref 135–145)
Total Bilirubin: 1.1 mg/dL (ref 0.3–1.2)
Total Protein: 4.9 g/dL — ABNORMAL LOW (ref 6.5–8.1)

## 2016-02-17 LAB — MRSA PCR SCREENING: MRSA BY PCR: POSITIVE — AB

## 2016-02-17 LAB — PROTIME-INR
INR: 1.37
Prothrombin Time: 17 seconds — ABNORMAL HIGH (ref 11.4–15.2)

## 2016-02-17 LAB — ABO/RH: ABO/RH(D): A NEG

## 2016-02-17 LAB — APTT: aPTT: 30 seconds (ref 24–36)

## 2016-02-17 LAB — GLUCOSE, CAPILLARY: GLUCOSE-CAPILLARY: 98 mg/dL (ref 65–99)

## 2016-02-17 MED ORDER — LORAZEPAM 2 MG/ML IJ SOLN
1.0000 mg | Freq: Once | INTRAMUSCULAR | Status: AC
  Administered 2016-02-18: 1 mg via INTRAVENOUS

## 2016-02-17 MED ORDER — LORAZEPAM 2 MG/ML IJ SOLN
INTRAMUSCULAR | Status: AC
  Filled 2016-02-17: qty 1

## 2016-02-17 NOTE — Progress Notes (Signed)
Subjective: Pt with some confusion this AM   Objective: Vital signs in last 24 hours: Temp:  [97.7 F (36.5 C)-98.6 F (37 C)] 98.6 F (37 C) (10/15 0206) Pulse Rate:  [60-86] 71 (10/15 0830) Resp:  [10-28] 16 (10/15 0830) BP: (107-155)/(39-90) 131/54 (10/15 0830) SpO2:  [93 %-100 %] 96 % (10/15 0830) Weight:  [76.2 kg (168 lb)] 76.2 kg (168 lb) (10/14 1132)    Intake/Output from previous day: 10/14 0701 - 10/15 0700 In: 2000 [I.V.:2000] Out: 700 [Urine:700] Intake/Output this shift: Total I/O In: 1000 [I.V.:1000] Out: -   General appearance: alert and cooperative Head: Normocephalic, without obvious abnormality, L scalp lac hemostatic Cardio: regular rate and rhythm, S1, S2 normal, no murmur, click, rub or gallop GI: soft, non-tender; bowel sounds normal; no masses,  no organomegaly  Lab Results:   Recent Labs  02/16/16 1427 02/17/16 0415  WBC 16.6* 8.5  HGB 13.3 9.5*  HCT 39.2* 28.2*  PLT 144* 116*   BMET  Recent Labs  02/16/16 1427 02/17/16 0415  NA 141 140  K 3.9 4.1  CL 107 113*  CO2 26 23  GLUCOSE 131* 120*  BUN 27* 17  CREATININE 1.38* 1.07  CALCIUM 9.0 7.5*   PT/INR  Recent Labs  02/16/16 1427 02/17/16 0415  LABPROT 14.7 17.0*  INR 1.14 1.37   ABG No results for input(s): PHART, HCO3 in the last 72 hours.  Invalid input(s): PCO2, PO2  Studies/Results: Dg Chest 1 View  Result Date: 02/16/2016 CLINICAL DATA:  Fall EXAM: CHEST 1 VIEW COMPARISON:  Chest x-ray dated 04/20/2013. FINDINGS: Heart size and mediastinal contours are stable. Atherosclerotic calcifications again noted at the aortic arch. Calcified pleural plaques again noted bilaterally. Lungs otherwise clear. No new lung findings. No pleural effusion or pneumothorax seen. There is a slightly displaced fracture of the left lateral eighth rib. There is an additional slightly displaced fracture of the left posterior fourth rib. IMPRESSION: 1. Slightly displaced acute-appearing  fractures of the posterior left fourth rib and lateral left eighth rib. 2. 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 No new lung findings. No pleural effusion or pneumothorax seen. 3. Aortic atherosclerosis. Electronically Signed   By: Franki Cabot M.D.   On: 02/16/2016 14:23   Dg Elbow Complete Left  Result Date: 02/16/2016 CLINICAL DATA:  Pain after fall. EXAM: LEFT ELBOW - COMPLETE 3+ VIEW COMPARISON:  None. FINDINGS: There is no evidence of fracture, dislocation, or joint effusion. There is no evidence of arthropathy or other focal bone abnormality. Soft tissues are unremarkable. IMPRESSION: Negative. Electronically Signed   By: Dorise Bullion III M.D   On: 02/16/2016 14:21   Ct Head Wo Contrast  Result Date: 02/16/2016 CLINICAL DATA:  [place]ed fall at [place] hitting back of head. History dementia. EXAM: CT HEAD WITHOUT CONTRAST CT CERVICAL SPINE WITHOUT CONTRAST TECHNIQUE: Multidetector CT imaging of the head and cervical spine was performed following the standard protocol without intravenous contrast. Multiplanar CT image reconstructions of the cervical spine were also generated. COMPARISON:  None. FINDINGS: CT HEAD FINDINGS Brain: Age advanced atrophy with sulcal prominence and centralized volume loss with commensurate ex vacuo dilatation of the ventricular system. Scattered periventricular hypodensities compatible with microvascular ischemic disease. Given background parenchymal abnormalities, there is no CT evidence of superimposed acute large territory infarct. No intraparenchymal or extra-axial mass or hemorrhage. Normal configuration of the ventricles and basilar cisterns. No midline shift. Vascular: No hyperdense vessel or unexpected calcification. Skull: No displaced calvarial fracture with special  attention paid to the high right posterior parietal calvarium. Sinuses/Orbits: Limited visualization the paranasal sinuses and mastoid air cells is normal. No  air-fluid levels. Post bilateral cataract surgery. Other: Scattered punctate foci of subcutaneous emphysema are noted about the high right posterior parietal calvarium (representative images 29 and 30, series 2) with minimal amount of adjacent subcutaneous edema. No radiopaque foreign body. CT CERVICAL SPINE FINDINGS Alignment: C1 to the superior endplate of T2 is imaged. Normal alignment of the cervical spine. No anterolisthesis or retrolisthesis. The bilateral facets are normally aligned. Skull base and vertebrae: The dens is normally positioned between the lateral masses of C1. Moderate degenerative change of the atlantodental articulation with several adjacent punctate ossicles. Normal atlanto axial articulations. Soft tissues and spinal canal: Prevertebral soft tissues are normal. Disc levels: Mild-to-moderate multilevel cervical spine DDD, worse at C3-C4 with disc space height loss, endplate irregularity and sclerosis. Upper chest: Limited visualization of the lung apices is normal. Other: Calcified atherosclerotic plaque within the bilateral carotid bulbs. There is mild diffuse heterogeneity of thyroid parenchyma. No bulky cervical lymphadenopathy on this noncontrast examination. IMPRESSION: 1. Suspected laceration about the high right posterior parietal calvarium without associated radiopaque foreign body, displaced calvarial fracture or acute intracranial process. 2. No fracture or static subluxation of the cervical spine. 3. Age advanced atrophy and mild microvascular ischemic disease. 4. Mild to moderate multilevel cervical spine DDD, worse at C3-C4. Electronically Signed   By: Sandi Mariscal M.D.   On: 02/16/2016 13:27   Ct Chest W Contrast  Result Date: 02/16/2016 CLINICAL DATA:  Pt arrived to ED after an unwitnessed fall at church. Per EMS the pt fell 3ft from church stage and hit his head. Pt also had c/o of left side rib pain. EXAM: CT CHEST, ABDOMEN, AND PELVIS WITH CONTRAST CT THORACIC SPINE  without contrast TECHNIQUE: Multidetector CT imaging of the chest, abdomen and pelvis was performed following the standard protocol during bolus administration of intravenous contrast. Multi detector CT images of the thoracic spine were performed with standard reconstructions. CONTRAST:  170mL ISOVUE-300 IOPAMIDOL (ISOVUE-300) INJECTION 61% COMPARISON:  CT abdomen dated 02/15/2009. Chest x-ray from earlier today. FINDINGS: CT CHEST FINDINGS Cardiovascular: Heart size is normal. Thoracic aorta appears intact and normal in caliber throughout. Scattered atherosclerotic changes of the thoracic aorta. Coronary artery calcifications noted. Mediastinum/Nodes: No hemorrhage or edema appreciated within the mediastinum. Scattered small lymph nodes within the mediastinum. Trachea and esophagus are unremarkable. Lungs/Pleura: Calcified pleural plaques noted bilaterally. Associated soft tissue thickening at the sites of the pleural plaques, without asymmetrically prominent or irregular soft tissue mass at any site. Mild atelectasis and small pleural effusion on the left. No pneumothorax seen, although there are small foci of air within the lower left epicardial fat, presumably related to occult tiny pneumothorax. Musculoskeletal: There are multiple displaced fractures throughout the left ribs. This includes slightly displaced fractures within the lateral left fourth through ninth ribs. More significantly displaced fractures are seen within the posterior left fourth, sixth, seventh, eighth, and ninth ribs. Additional minimally displaced fracture noted within the posterior left tenth rib. CT ABDOMEN PELVIS FINDINGS Hepatobiliary: Liver and gallbladder appear normal. Small amount of free fluid noted adjacent to the liver. Pancreas: Unremarkable. No pancreatic ductal dilatation or surrounding inflammatory changes. Spleen: Spleen appears normal. Small amount of hyperdense free fluid adjacent to the spleen. Adrenals/Urinary Tract:  Adrenal glands appear normal. Bilateral renal cysts. Kidneys otherwise unremarkable. No ureteral or bladder calculi identified. Mild fluid stranding noted within the left retroperitoneum, below  the left kidney. Stomach/Bowel: Bowel is normal in caliber. No bowel wall thickening or evidence of bowel wall inflammation. Appendix is normal. Stomach appears normal. Vascular/Lymphatic: Aortic atherosclerosis. No acute appearing vascular abnormality. No enlarged lymph nodes seen. Reproductive: Mild prostate gland enlargement. Other: Small amount of additional hyperdense free fluid in the lower pelvis, likely blood products. No active hemorrhage/contrast extravasation identified. Musculoskeletal: Degenerative changes throughout the thoracolumbar spine, mild to moderate in degree. No osseous fracture or dislocation identified in the abdomen or pelvis. Superficial soft tissues are unremarkable. CT THORACIC SPINE: Alignment: Mild scoliosis.  No acute subluxation. Vertebrae: No fracture line or displaced fracture fragment identified. Facets appear intact and normally aligned throughout. Paraspinal and other soft tissues: The immediate paravertebral soft tissues are unremarkable. No evidence of central canal hematoma. Disc levels: No significant degenerative change at any level. No significant central canal stenosis. IMPRESSION: 1. Multiple displaced fractures of the left ribs, as detailed above, including fractures within both the lateral and posterior portions of the fourth, sixth, seventh, eighth and ninth ribs (most significant fracture displacement within the posterior ribs). This certainly places the patient at risk for development of flail chest syndrome. 2. No discrete pneumothorax seen. There are, however, 2 punctate foci of air within the lower left epicardial fat which likely indicates an occult tiny pneumothorax on the left. Also associated mild atelectasis and small effusion at the left lung base. 3. Small amount of  hyperdense free fluid, compatible with acute hemorrhage, in the upper abdomen and lower pelvis. No source for the hemorrhage identified. No solid organ laceration identified. Suspect occult surface laceration of the spleen versus mesenteric venous bleed. No active hemorrhage/contrast extravasation identified on this exam. 4. No additional acute appearing findings within the chest, abdomen or pelvis. 5. No fracture or acute subluxation within the thoracic spine. 6. 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 7. Aortic atherosclerosis. 8. Additional chronic/incidental findings detailed above. Critical Value/emergent results were called by telephone at the time of interpretation on 02/16/2016 at 4:43 pm to Dr. Eula Listen , who verbally acknowledged these results. Electronically Signed   By: Franki Cabot M.D.   On: 02/16/2016 16:47   Ct Cervical Spine Wo Contrast  Result Date: 02/16/2016 CLINICAL DATA:  [place]ed fall at church hitting back of head. History dementia. EXAM: CT HEAD WITHOUT CONTRAST CT CERVICAL SPINE WITHOUT CONTRAST TECHNIQUE: Multidetector CT imaging of the head and cervical spine was performed following the standard protocol without intravenous contrast. Multiplanar CT image reconstructions of the cervical spine were also generated. COMPARISON:  None. FINDINGS: CT HEAD FINDINGS Brain: Age advanced atrophy with sulcal prominence and centralized volume loss with commensurate ex vacuo dilatation of the ventricular system. Scattered periventricular hypodensities compatible with microvascular ischemic disease. Given background parenchymal abnormalities, there is no CT evidence of superimposed acute large territory infarct. No intraparenchymal or extra-axial mass or hemorrhage. Normal configuration of the ventricles and basilar cisterns. No midline shift. Vascular: No hyperdense vessel or unexpected calcification. Skull: No displaced calvarial fracture with  special attention paid to the high right posterior parietal calvarium. Sinuses/Orbits: Limited visualization the paranasal sinuses and mastoid air cells is normal. No air-fluid levels. Post bilateral cataract surgery. Other: Scattered punctate foci of subcutaneous emphysema are noted about the high right posterior parietal calvarium (representative images 29 and 30, series 2) with minimal amount of adjacent subcutaneous edema. No radiopaque foreign body. CT CERVICAL SPINE FINDINGS Alignment: C1 to the superior endplate of T2 is imaged. Normal alignment of the cervical spine.  No anterolisthesis or retrolisthesis. The bilateral facets are normally aligned. Skull base and vertebrae: The dens is normally positioned between the lateral masses of C1. Moderate degenerative change of the atlantodental articulation with several adjacent punctate ossicles. Normal atlanto axial articulations. Soft tissues and spinal canal: Prevertebral soft tissues are normal. Disc levels: Mild-to-moderate multilevel cervical spine DDD, worse at C3-C4 with disc space height loss, endplate irregularity and sclerosis. Upper chest: Limited visualization of the lung apices is normal. Other: Calcified atherosclerotic plaque within the bilateral carotid bulbs. There is mild diffuse heterogeneity of thyroid parenchyma. No bulky cervical lymphadenopathy on this noncontrast examination. IMPRESSION: 1. Suspected laceration about the high right posterior parietal calvarium without associated radiopaque foreign body, displaced calvarial fracture or acute intracranial process. 2. No fracture or static subluxation of the cervical spine. 3. Age advanced atrophy and mild microvascular ischemic disease. 4. Mild to moderate multilevel cervical spine DDD, worse at C3-C4. Electronically Signed   By: Sandi Mariscal M.D.   On: 02/16/2016 13:27   Ct Thoracic Spine Wo Contrast  Result Date: 02/16/2016 CLINICAL DATA:  Pt arrived to ED after an [place]itnessed fall at  church. Per EMS the pt fell 38ft from [place] stage and hit his head. Pt also had c/o of left side rib pain. EXAM: CT CHEST, ABDOMEN, AND PELVIS WITH CONTRAST CT THORACIC SPINE without contrast TECHNIQUE: Multidetector CT imaging of the chest, abdomen and pelvis was performed following the standard protocol during bolus administration of intravenous contrast. Multi detector CT images of the thoracic spine were performed with standard reconstructions. CONTRAST:  164mL ISOVUE-300 IOPAMIDOL (ISOVUE-300) INJECTION 61% COMPARISON:  CT abdomen dated 02/15/2009. Chest x-ray from earlier today. FINDINGS: CT CHEST FINDINGS Cardiovascular: Heart size is normal. Thoracic aorta appears intact and normal in caliber throughout. Scattered atherosclerotic changes of the thoracic aorta. Coronary artery calcifications noted. Mediastinum/Nodes: No hemorrhage or edema appreciated within the mediastinum. Scattered small lymph nodes within the mediastinum. Trachea and esophagus are unremarkable. Lungs/Pleura: Calcified pleural plaques noted bilaterally. Associated soft tissue thickening at the sites of the pleural plaques, without asymmetrically prominent or irregular soft tissue mass at any site. Mild atelectasis and small pleural effusion on the left. No pneumothorax seen, although there are small foci of air within the lower left epicardial fat, presumably related to occult tiny pneumothorax. Musculoskeletal: There are multiple displaced fractures throughout the left ribs. This includes slightly displaced fractures within the lateral left fourth through ninth ribs. More significantly displaced fractures are seen within the posterior left fourth, sixth, seventh, eighth, and ninth ribs. Additional minimally displaced fracture noted within the posterior left tenth rib. CT ABDOMEN PELVIS FINDINGS Hepatobiliary: Liver and gallbladder appear normal. Small amount of free fluid noted adjacent to the liver. Pancreas: Unremarkable. No pancreatic  ductal dilatation or surrounding inflammatory changes. Spleen: Spleen appears normal. Small amount of hyperdense free fluid adjacent to the spleen. Adrenals/Urinary Tract: Adrenal glands appear normal. Bilateral renal cysts. Kidneys otherwise unremarkable. No ureteral or bladder calculi identified. Mild fluid stranding noted within the left retroperitoneum, below the left kidney. Stomach/Bowel: Bowel is normal in caliber. No bowel wall thickening or evidence of bowel wall inflammation. Appendix is normal. Stomach appears normal. Vascular/Lymphatic: Aortic atherosclerosis. No acute appearing vascular abnormality. No enlarged lymph nodes seen. Reproductive: Mild prostate gland enlargement. Other: Small amount of additional hyperdense free fluid in the lower pelvis, likely blood products. No active hemorrhage/contrast extravasation identified. Musculoskeletal: Degenerative changes throughout the thoracolumbar spine, mild to moderate in degree. No osseous fracture or dislocation identified in  the abdomen or pelvis. Superficial soft tissues are unremarkable. CT THORACIC SPINE: Alignment: Mild scoliosis.  No acute subluxation. Vertebrae: No fracture line or displaced fracture fragment identified. Facets appear intact and normally aligned throughout. Paraspinal and other soft tissues: The immediate paravertebral soft tissues are unremarkable. No evidence of central canal hematoma. Disc levels: No significant degenerative change at any level. No significant central canal stenosis. IMPRESSION: 1. Multiple displaced fractures of the left ribs, as detailed above, including fractures within both the lateral and posterior portions of the fourth, sixth, seventh, eighth and ninth ribs (most significant fracture displacement within the posterior ribs). This certainly places the patient at risk for development of flail chest syndrome. 2. No discrete pneumothorax seen. There are, however, 2 punctate foci of air within the lower left  epicardial fat which likely indicates an occult tiny pneumothorax on the left. Also associated mild atelectasis and small effusion at the left lung base. 3. Small amount of hyperdense free fluid, compatible with acute hemorrhage, in the upper abdomen and lower pelvis. No source for the hemorrhage identified. No solid organ laceration identified. Suspect occult surface laceration of the spleen versus mesenteric venous bleed. No active hemorrhage/contrast extravasation identified on this exam. 4. No additional acute appearing findings within the chest, abdomen or pelvis. 5. No fracture or acute subluxation within the thoracic spine. 6. 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 7. Aortic atherosclerosis. 8. Additional chronic/incidental findings detailed above. Critical Value/emergent results were called by telephone at the time of interpretation on 02/16/2016 at 4:43 pm to Dr. Eula Listen , who verbally acknowledged these results. Electronically Signed   By: Franki Cabot M.D.   On: 02/16/2016 16:47   Ct Abdomen Pelvis W Contrast  Result Date: 02/16/2016 CLINICAL DATA:  Pt arrived to ED after an [place]itnessed fall at church. Per EMS the pt fell 98ft from church stage and hit his head. Pt also had c/o of left side rib pain. EXAM: CT CHEST, ABDOMEN, AND PELVIS WITH CONTRAST CT THORACIC SPINE without contrast TECHNIQUE: Multidetector CT imaging of the chest, abdomen and pelvis was performed following the standard protocol during bolus administration of intravenous contrast. Multi detector CT images of the thoracic spine were performed with standard reconstructions. CONTRAST:  170mL ISOVUE-300 IOPAMIDOL (ISOVUE-300) INJECTION 61% COMPARISON:  CT abdomen dated 02/15/2009. Chest x-ray from earlier today. FINDINGS: CT CHEST FINDINGS Cardiovascular: Heart size is normal. Thoracic aorta appears intact and normal in caliber throughout. Scattered atherosclerotic changes of the thoracic  aorta. Coronary artery calcifications noted. Mediastinum/Nodes: No hemorrhage or edema appreciated within the mediastinum. Scattered small lymph nodes within the mediastinum. Trachea and esophagus are unremarkable. Lungs/Pleura: Calcified pleural plaques noted bilaterally. Associated soft tissue thickening at the sites of the pleural plaques, without asymmetrically prominent or irregular soft tissue mass at any site. Mild atelectasis and small pleural effusion on the left. No pneumothorax seen, although there are small foci of air within the lower left epicardial fat, presumably related to occult tiny pneumothorax. Musculoskeletal: There are multiple displaced fractures throughout the left ribs. This includes slightly displaced fractures within the lateral left fourth through ninth ribs. More significantly displaced fractures are seen within the posterior left fourth, sixth, seventh, eighth, and ninth ribs. Additional minimally displaced fracture noted within the posterior left tenth rib. CT ABDOMEN PELVIS FINDINGS Hepatobiliary: Liver and gallbladder appear normal. Small amount of free fluid noted adjacent to the liver. Pancreas: Unremarkable. No pancreatic ductal dilatation or surrounding inflammatory changes. Spleen: Spleen appears normal. Small amount of  hyperdense free fluid adjacent to the spleen. Adrenals/Urinary Tract: Adrenal glands appear normal. Bilateral renal cysts. Kidneys otherwise unremarkable. No ureteral or bladder calculi identified. Mild fluid stranding noted within the left retroperitoneum, below the left kidney. Stomach/Bowel: Bowel is normal in caliber. No bowel wall thickening or evidence of bowel wall inflammation. Appendix is normal. Stomach appears normal. Vascular/Lymphatic: Aortic atherosclerosis. No acute appearing vascular abnormality. No enlarged lymph nodes seen. Reproductive: Mild prostate gland enlargement. Other: Small amount of additional hyperdense free fluid in the lower  pelvis, likely blood products. No active hemorrhage/contrast extravasation identified. Musculoskeletal: Degenerative changes throughout the thoracolumbar spine, mild to moderate in degree. No osseous fracture or dislocation identified in the abdomen or pelvis. Superficial soft tissues are unremarkable. CT THORACIC SPINE: Alignment: Mild scoliosis.  No acute subluxation. Vertebrae: No fracture line or displaced fracture fragment identified. Facets appear intact and normally aligned throughout. Paraspinal and other soft tissues: The immediate paravertebral soft tissues are unremarkable. No evidence of central canal hematoma. Disc levels: No significant degenerative change at any level. No significant central canal stenosis. IMPRESSION: 1. Multiple displaced fractures of the left ribs, as detailed above, including fractures within both the lateral and posterior portions of the fourth, sixth, seventh, eighth and ninth ribs (most significant fracture displacement within the posterior ribs). This certainly places the patient at risk for development of flail chest syndrome. 2. No discrete pneumothorax seen. There are, however, 2 punctate foci of air within the lower left epicardial fat which likely indicates an occult tiny pneumothorax on the left. Also associated mild atelectasis and small effusion at the left lung base. 3. Small amount of hyperdense free fluid, compatible with acute hemorrhage, in the upper abdomen and lower pelvis. No source for the hemorrhage identified. No solid organ laceration identified. Suspect occult surface laceration of the spleen versus mesenteric venous bleed. No active hemorrhage/contrast extravasation identified on this exam. 4. No additional acute appearing findings within the chest, abdomen or pelvis. 5. No fracture or acute subluxation within the thoracic spine. 6. 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 7. Aortic atherosclerosis. 8. Additional  chronic/incidental findings detailed above. Critical Value/emergent results were called by telephone at the time of interpretation on 02/16/2016 at 4:43 pm to Dr. Eula Listen , who verbally acknowledged these results. Electronically Signed   By: Franki Cabot M.D.   On: 02/16/2016 16:47   Dg Chest Port 1 View  Result Date: 02/17/2016 CLINICAL DATA:  Multiple left rib fractures. EXAM: PORTABLE CHEST 1 VIEW COMPARISON:  Radiographs and CT CT yesterday. FINDINGS: Left pleural effusion and left lung base opacity are increased from prior radiograph. Left rib fractures, better assessed on CT. No evidence of developing pneumothorax. There are bilateral irregular calcified pleural plaques. Unchanged heart size and mediastinal contours. IMPRESSION: Left rib fractures. Left pleural effusion and basilar opacity, mildly progressed from prior radiograph. Electronically Signed   By: Jeb Levering M.D.   On: 02/17/2016 06:01    Anti-infectives: Anti-infectives    None      Assessment/Plan: Fall Left scalp laceration Left fourth through ninth rib fractures with small pneumothorax Hemoperitoneum-Probable splenic lac Mildly elevated creatinine Chronic sacral pain Perineal hernia Dementia Hypertension Scattered contusions Sleep apnea on C Pap  -Con't to monitor Hct -hold DVT prophylaxis, SCDs -Aggressive IS and pain control -PT/OT  LOS: 1 day    Rosario Jacks., [facility] 02/17/2016

## 2016-02-17 NOTE — Progress Notes (Signed)
Placed patient on CPAP with no complications. RT will continue to monitor.

## 2016-02-17 NOTE — ED Notes (Signed)
Pt more confused at this time, alert to self only. MD Redmond Pulling notified.

## 2016-02-18 ENCOUNTER — Inpatient Hospital Stay (HOSPITAL_COMMUNITY): Payer: PPO

## 2016-02-18 DIAGNOSIS — S36030A Superficial (capsular) laceration of spleen, initial encounter: Secondary | ICD-10-CM | POA: Diagnosis not present

## 2016-02-18 DIAGNOSIS — S2242XA Multiple fractures of ribs, left side, initial encounter for closed fracture: Secondary | ICD-10-CM | POA: Diagnosis not present

## 2016-02-18 DIAGNOSIS — M542 Cervicalgia: Secondary | ICD-10-CM | POA: Diagnosis not present

## 2016-02-18 DIAGNOSIS — W19XXXA Unspecified fall, initial encounter: Secondary | ICD-10-CM | POA: Diagnosis present

## 2016-02-18 DIAGNOSIS — D62 Acute posthemorrhagic anemia: Secondary | ICD-10-CM | POA: Diagnosis not present

## 2016-02-18 DIAGNOSIS — S36039A Unspecified laceration of spleen, initial encounter: Secondary | ICD-10-CM | POA: Diagnosis present

## 2016-02-18 DIAGNOSIS — S0101XA Laceration without foreign body of scalp, initial encounter: Secondary | ICD-10-CM | POA: Diagnosis present

## 2016-02-18 DIAGNOSIS — S199XXA Unspecified injury of neck, initial encounter: Secondary | ICD-10-CM | POA: Diagnosis not present

## 2016-02-18 DIAGNOSIS — F0390 Unspecified dementia without behavioral disturbance: Secondary | ICD-10-CM | POA: Diagnosis not present

## 2016-02-18 LAB — HEMOGLOBIN AND HEMATOCRIT, BLOOD
HCT: 33 % — ABNORMAL LOW (ref 39.0–52.0)
Hemoglobin: 11.2 g/dL — ABNORMAL LOW (ref 13.0–17.0)

## 2016-02-18 MED ORDER — MORPHINE SULFATE (PF) 2 MG/ML IV SOLN: 2.0000 mg | INTRAVENOUS | Status: DC | PRN

## 2016-02-18 MED ORDER — TRAMADOL HCL 50 MG PO TABS
50.0000 mg | ORAL_TABLET | Freq: Four times a day (QID) | ORAL | Status: DC | PRN
  Administered 2016-02-18 – 2016-02-22 (×7): 100 mg via ORAL
  Administered 2016-02-22 – 2016-02-23 (×2): 50 mg via ORAL
  Filled 2016-02-18 (×8): qty 2
  Filled 2016-02-18: qty 1

## 2016-02-18 MED ORDER — ENSURE ENLIVE PO LIQD
237.0000 mL | Freq: Two times a day (BID) | ORAL | Status: DC
  Administered 2016-02-18 – 2016-02-25 (×12): 237 mL via ORAL

## 2016-02-18 MED ORDER — HALOPERIDOL LACTATE 5 MG/ML IJ SOLN
5.0000 mg | Freq: Four times a day (QID) | INTRAMUSCULAR | Status: DC | PRN
  Administered 2016-02-24: 5 mg via INTRAVENOUS
  Filled 2016-02-18: qty 1

## 2016-02-18 MED ORDER — POLYETHYLENE GLYCOL 3350 17 G PO PACK
17.0000 g | PACK | Freq: Every day | ORAL | Status: DC
  Administered 2016-02-18 – 2016-02-25 (×7): 17 g via ORAL
  Filled 2016-02-18 (×9): qty 1

## 2016-02-18 MED ORDER — MUPIROCIN 2 % EX OINT
1.0000 "application " | TOPICAL_OINTMENT | Freq: Two times a day (BID) | CUTANEOUS | Status: AC
  Administered 2016-02-18 – 2016-02-22 (×10): 1 via NASAL
  Filled 2016-02-18 (×3): qty 22

## 2016-02-18 MED ORDER — CHLORHEXIDINE GLUCONATE CLOTH 2 % EX PADS
6.0000 | MEDICATED_PAD | Freq: Every day | CUTANEOUS | Status: AC
  Administered 2016-02-19 – 2016-02-22 (×3): 6 via TOPICAL

## 2016-02-18 NOTE — Progress Notes (Signed)
Pt woke up agitated, saying he was a prisoner, after repeated attempts to reorient pt ,pt  Still pulling at lines and aspen collar paged Dr. Emmie Niemann who gave one time order of ativan 1 mg. About 20 minin later somewhat calmer still thanks he is a prisoner but is no longer pulling at lines at this time

## 2016-02-18 NOTE — Care Management Note (Signed)
Case Management Note  Patient Details  Name: [name] MRN: [id number] Date of Birth: 1934/06/05  Subjective/Objective:   S/p fall, has left scalp laceration, left 4th-9th rib fractures with small ptx, grade 1 splenic laceration, acute blood loss anemia.  Await pt/ot eval.  NCM will cont to follow for dc needs.                 Action/Plan:   Expected Discharge Date:                  Expected Discharge Plan:  [place]  In-House Referral:     Discharge planning Services  CM Consult  Post Acute Care Choice:    Choice offered to:     DME Arranged:    DME Agency:     HH Arranged:    [place] Agency:     Status of Service:  In process, will continue to follow  If discussed at Long Length of Stay Meetings, dates discussed:    Additional Comments:  Zenon Mayo, RN 02/18/2016, 5:26 PM

## 2016-02-18 NOTE — Progress Notes (Addendum)
Pt off the monitor went into room pt pulling at aspen collar and had pulled his gown and leads off, when I went to replace them pt forcibly pushed my arms away and was swinging his legs at me. Called for help from staff and wife at the bedside the pt continues to think he is imprisoned or at times that he is at his house attempted multiple times to talk pt down without success. Safety mittens placed on pt will continue to monitor. Dr Donne Hazel called about pt's status, no new orders given

## 2016-02-18 NOTE — Progress Notes (Signed)
Patient ID: [name], male   DOB: 1935/01/29, 81 y.o.   MRN: [id number]   LOS: 2 days   Subjective: Confused, agitated at times. +FC.   Objective: Vital signs in last 24 hours: Temp:  [97.8 F (36.6 C)-99.8 F (37.7 C)] 99.8 F (37.7 C) (10/16 0730) Pulse Rate:  [66-107] 98 (10/16 0730) Resp:  [15-30] 19 (10/16 0730) BP: (101-146)/(52-111) 146/75 (10/16 0730) SpO2:  [90 %-96 %] 93 % (10/16 0730) Weight:  [82.6 kg (182 lb)] 82.6 kg (182 lb) (10/15 1723)    IS: 546ml   Laboratory  CBC  Recent Labs  02/16/16 1427 02/17/16 0415 02/18/16 0530  WBC 16.6* 8.5  --   HGB 13.3 9.5* 11.2*  HCT 39.2* 28.2* 33.0*  PLT 144* 116*  --     Physical Exam General appearance: no distress Resp: rales RUL Cardio: regular rate and rhythm GI: normal findings: bowel sounds normal and soft, non-tender   Assessment/Plan: Fall Left scalp laceration Left fourth through ninth rib fractures with small pneumothorax -- Pulmonary toilet Grade 1 splenic lac ABL anemia -- Mild, stable Multiple medical problems -- Home meds FEN -- Give diet, flex/ex, non-narcotics for pain VTE -- SCD's Dispo -- Continue SDU, PT/OT    Lisette Abu, PA-C Pager: [phone] General Trauma PA Pager: [phone]  02/18/2016

## 2016-02-18 NOTE — Progress Notes (Signed)
Patient refused CPAP for the night  

## 2016-02-18 NOTE — Progress Notes (Signed)
Initial Nutrition Assessment  DOCUMENTATION CODES:   Not applicable  INTERVENTION:    Ensure Enlive po BID, each supplement provides 350 kcal and 20 grams of protein  NUTRITION DIAGNOSIS:   Inadequate oral intake related to poor appetite as evidenced by per patient/family report.  GOAL:   Patient will meet greater than or equal to 90% of their needs  MONITOR:   PO intake, Supplement acceptance, Labs, Weight trends  REASON FOR ASSESSMENT:   Malnutrition Screening Tool    ASSESSMENT:   81 year old male with HTN, CAD, hyperlipidemia, chronic sacral pain, some baseline dementia who was admitted on 10/14 after a fall at church. He is being treated for scalp laceration, left 4th through 9th rib fx with small pneumothorax, splenic laceration, and ABL anemia.    Patient was sleeping during RD visit. Spoke with wife at bedside who reports that patient had lost a lot of weight, but has now gained it back. He was eating poorly PTA, but has not had anything to eat since Saturday morning. Nutrition-Focused physical exam completed. Findings are no fat depletion, mild muscle depletion, and no edema.  Labs reviewed. Medications reviewed and include Colace, Miralax, Flomax, KCl in IVF.  Diet Order:  Diet regular Room service appropriate? Yes; Fluid consistency: Thin  Skin:  Reviewed, no issues  Last BM:  unknown  Height:   Ht Readings from Last 1 Encounters:  02/17/16 5\' 8"  (1.727 m)    Weight:   Wt Readings from Last 1 Encounters:  02/17/16 182 lb (82.6 kg)    Ideal Body Weight:  70 kg  BMI:  Body mass index is 27.67 kg/m.  Estimated Nutritional Needs:   Kcal:  1800-2000  Protein:  85-100 gm  Fluid:  1.8-2 L  EDUCATION NEEDS:   No education needs identified at this time  Molli Barrows, [place], [place], [place] Pager [phone] After Hours Pager [phone]

## 2016-02-19 ENCOUNTER — Inpatient Hospital Stay (HOSPITAL_COMMUNITY): Payer: PPO

## 2016-02-19 DIAGNOSIS — F0390 Unspecified dementia without behavioral disturbance: Secondary | ICD-10-CM | POA: Diagnosis not present

## 2016-02-19 DIAGNOSIS — S36030A Superficial (capsular) laceration of spleen, initial encounter: Secondary | ICD-10-CM | POA: Diagnosis not present

## 2016-02-19 DIAGNOSIS — S2242XA Multiple fractures of ribs, left side, initial encounter for closed fracture: Secondary | ICD-10-CM | POA: Diagnosis not present

## 2016-02-19 DIAGNOSIS — R0602 Shortness of breath: Secondary | ICD-10-CM | POA: Diagnosis not present

## 2016-02-19 MED ORDER — ASPIRIN EC 81 MG PO TBEC
81.0000 mg | DELAYED_RELEASE_TABLET | Freq: Every day | ORAL | Status: DC
  Administered 2016-02-19 – 2016-02-25 (×7): 81 mg via ORAL
  Filled 2016-02-19 (×7): qty 1

## 2016-02-19 NOTE — Care Management Note (Signed)
Case Management Note  Patient Details  Name: [name] MRN: [id number] Date of Birth: Dec 12, 1934  Subjective/Objective:  PT recommending SNF at discharge.                    Action/Plan: CSW consulted to facilitate dc to SNF for short term rehab upon medical stability.  Will follow progress.    Expected Discharge Date:                  Expected Discharge Plan:  Skilled Nursing Facility  In-House Referral:  Clinical Social Work  Discharge planning Services  CM Consult  Post Acute Care Choice:    Choice offered to:     DME Arranged:    DME Agency:     HH Arranged:    [place] Agency:     Status of Service:  In process, will continue to follow  If discussed at Long Length of Stay Meetings, dates discussed:    Additional Comments:  Reinaldo Raddle, RN, [name]  Trauma/Neuro ICU Case Manager [phone]

## 2016-02-19 NOTE — Progress Notes (Signed)
Trauma Service Note  Subjective: Patient much more alert and awake and oriented today.  Minimal complaints  Objective: Vital signs in last 24 hours: Temp:  [97.5 F (36.4 C)-99.1 F (37.3 C)] 97.5 F (36.4 C) (10/17 0700) Pulse Rate:  [77-88] 77 (10/17 0700) Resp:  [17-26] 25 (10/17 0700) BP: (115-147)/(57-71) 121/60 (10/17 0700) SpO2:  [92 %-94 %] 93 % (10/17 0700) Last BM Date: 02/15/16  Intake/Output from previous day: 10/16 0701 - 10/17 0700 In: 2220 [P.O.:120; I.V.:2100] Out: 575 [Urine:575] Intake/Output this shift: No intake/output data recorded.  General: No acute distress.  Oriented to person and place.  Lungs: Clear  Abd: Benign  Extremities: No changes  Neuro: Intact, better orientation than yesterday.  Lab Results: CBC   Recent Labs  02/16/16 1427 02/17/16 0415 02/18/16 0530  WBC 16.6* 8.5  --   HGB 13.3 9.5* 11.2*  HCT 39.2* 28.2* 33.0*  PLT 144* 116*  --    BMET  Recent Labs  02/16/16 1427 02/17/16 0415  NA 141 140  K 3.9 4.1  CL 107 113*  CO2 26 23  GLUCOSE 131* 120*  BUN 27* 17  CREATININE 1.38* 1.07  CALCIUM 9.0 7.5*   PT/INR  Recent Labs  02/16/16 1427 02/17/16 0415  LABPROT 14.7 17.0*  INR 1.14 1.37   ABG No results for input(s): PHART, HCO3 in the last 72 hours.  Invalid input(s): PCO2, PO2  Studies/Results: Dg Cerv Spine Flex&ext Only  Result Date: 02/18/2016 CLINICAL DATA:  Fall several days ago.  Neck pain. EXAM: CERVICAL SPINE - FLEXION AND EXTENSION VIEWS ONLY COMPARISON:  Cervical spine CT on 02/16/2016 FINDINGS: No evidence cervical spine subluxation seen on flexion or extension views. Moderate degenerative disc disease again seen at C3-4, with mild degenerative disc disease seen at other lumbar levels. No acute fracture or other bone lesions identified. No evidence of prevertebral soft tissue swelling. IMPRESSION: Cervical spine degenerative disc disease. No evidence of cervical spine subluxation on flexion or  extension views. Electronically Signed   By: Earle Gell M.D.   On: 02/18/2016 14:06    Anti-infectives: Anti-infectives    None      Assessment/Plan: s/p  PT/OT.  Recommendations.  Transfer.  LOS: 3 days   Kathryne Eriksson. Dahlia Bailiff, MD, FACS [phone] Trauma Surgeon 02/19/2016

## 2016-02-19 NOTE — Clinical Social Work Note (Signed)
Patient not fully oriented. Patient's wife not in room. CSW called mobile number for her which was out of service. CSW called and left message on home number. CSW to discuss SNF placement.  Dayton Scrape, [place]

## 2016-02-19 NOTE — Evaluation (Signed)
Occupational Therapy Evaluation Patient Details Name: [name] MRN: [id number] DOB: 16-Jul-1934 Today's Date: 02/19/2016    History of Present Illness 81 y.o. male admitted to [facility] on 02/16/16 s/p fall with resultant L scalp laceration, L 4-9th rib fx with small PTX, grade 1 splenic lac, and ABL anemia.  Pt with significant PMhx of HTN, dementia, L shoulder RTC rupture, CAD, A-fib, and R shoulder surgery.     Clinical Impression   Pt admitted with above. He demonstrates the below listed deficits and will benefit from continued OT to maximize safety and independence with BADLs.  Pt presents to OT with pain, impaired balance, impaired cognition, decreased activity tolerance. He currently requires total A for ADLs and mod A +2 for functional transfers.  Recommend SNF.       Follow Up Recommendations  SNF;Supervision/Assistance - 24 hour    Equipment Recommendations  3 in 1 bedside comode;None recommended by OT    Recommendations for Other Services       Precautions / Restrictions Precautions Precautions: Fall Precaution Comments: h/o falls      Mobility Bed Mobility Overal bed mobility: Needs Assistance Bed Mobility: Sit to Supine     Supine to sit: +2 for physical assistance;Mod assist;HOB elevated Sit to supine: Max assist;+2 for physical assistance   General bed mobility comments: Assist to lower trunk to bed and to lift LEs onto bed   Transfers Overall transfer level: Needs assistance Equipment used: 2 person hand held assist Transfers: Sit to/from [employer] Sit to Stand: Mod assist;+2 physical assistance Stand pivot transfers: Mod assist;+2 physical assistance       General transfer comment: Pt requires assist to move into standing, as well as to maintain balance to pivot     Balance Overall balance assessment: Needs assistance Sitting-balance support: Feet supported Sitting balance-Leahy Scale: Fair Sitting balance - Comments: Once  situated EOB with both feet  on the floor he could be supervision.    Standing balance support: Bilateral upper extremity supported Standing balance-Leahy Scale: Poor Standing balance comment: needs external support in static standing and dynamic movements.                            ADL Overall ADL's : Needs assistance/impaired Eating/Feeding: Set up;Sitting   Grooming: Wash/dry hands;Wash/dry face;Oral care;Brushing hair;Minimal assistance;Sitting   Upper Body Bathing: Maximal assistance;Sitting   Lower Body Bathing: Total assistance;Sit to/from stand   Upper Body Dressing : Maximal assistance;Sitting   Lower Body Dressing: Total assistance;Sit to/from stand   Toilet Transfer: Moderate assistance;+2 for physical assistance;Stand-pivot;BSC   Toileting- Clothing Manipulation and Hygiene: Total assistance;Sit to/from stand       Functional mobility during ADLs: Moderate assistance;+2 for physical assistance General ADL Comments: Pt limited by pain and cognitive impairments      Vision     Perception     Praxis      Pertinent Vitals/Pain Pain Assessment: Faces Faces Pain Scale: Hurts even more Pain Location: Lt ribs  Pain Descriptors / Indicators: Grimacing;Guarding;Aching;Sore Pain Intervention(s): Limited activity within patient's tolerance;Monitored during session;Repositioned     Hand Dominance Right   Extremity/Trunk Assessment Upper Extremity Assessment Upper Extremity Assessment: Generalized weakness   Lower Extremity Assessment Lower Extremity Assessment: Defer to PT evaluation RLE Deficits / Details: pt shaky on his feet, short choppy steps to chair, reports pain in left side even with moving left leg to EOB.   LLE Deficits / Details:  pt shaky on his feet, short choppy steps to chair, reports pain in left side even with moving left leg to EOB.     Cervical / Trunk Assessment Cervical / Trunk Assessment: Other exceptions Cervical / Trunk  Exceptions: per friend he has h/o back issues and was going to have surgery to fix it in the next month or so.  He reports his stooped walking posture was from back issues.    Communication Communication Communication: No difficulties   Cognition Arousal/Alertness: Awake/alert Behavior During Therapy: Flat affect Overall Cognitive Status: No family/caregiver present to determine baseline cognitive functioning       Memory: Decreased short-term memory             General Comments       Exercises       Shoulder Instructions      Home Living Family/patient expects to be discharged to:: Private residence Living Arrangements: Spouse/significant other (per friend wife is independent) Available Help at Discharge: Family;Available 24 hours/day Type of Home: House Home Access: Stairs to enter [employer] of Steps: 3 Entrance Stairs-Rails:  (unsure) Home Layout: One level         Bathroom Toilet: Standard                Prior Functioning/Environment Level of Independence: Independent        Comments: Pt is a poor historian and friend only able to provide surface info.          OT Problem List: Decreased strength;Decreased activity tolerance;Impaired balance (sitting and/or standing);Decreased cognition;Decreased safety awareness;Decreased knowledge of use of DME or AE;Cardiopulmonary status limiting activity;Pain   OT Treatment/Interventions: Self-care/ADL training;Therapeutic exercise;DME and/or AE instruction;Therapeutic activities;Cognitive remediation/compensation;Patient/family education;Balance training    OT Goals(Current goals can be found in the care plan section) Acute Rehab OT Goals Patient Stated Goal: to have less pain  OT Goal Formulation: With patient Time For Goal Achievement: 03/04/16 Potential to Achieve Goals: Good ADL Goals Pt Will Perform Grooming: with min assist;standing Pt Will Perform Upper Body Bathing: with min  assist;sitting Pt Will Perform Lower Body Bathing: with mod assist;sit to/from stand;with adaptive equipment Pt Will Perform Lower Body Dressing: with mod assist;with adaptive equipment;sit to/from stand Pt Will Transfer to Toilet: with min assist;ambulating;regular height toilet;bedside commode;grab bars Pt Will Perform Toileting - Clothing Manipulation and hygiene: with mod assist;sit to/from stand  OT Frequency: Min 2X/week   Barriers to D/C: Decreased caregiver support          Co-evaluation              End of Session Nurse Communication: Mobility status;Other (comment) (hemturia )  Activity Tolerance: Patient limited by pain Patient left: in bed;with call bell/phone within reach;with bed alarm set   Time: 1433-1500 OT Time Calculation (min): 27 min Charges:  OT General Charges $OT Visit: 1 Procedure OT Evaluation $OT Eval Moderate Complexity: 1 Procedure OT Treatments $Therapeutic Activity: 8-22 mins G-Codes:    Julene Rahn M March 06, 2016, 5:05 PM

## 2016-02-19 NOTE — Evaluation (Signed)
Physical Therapy Evaluation Patient Details Name: [name] MRN: [id number] DOB: May 24, 1934 Today's Date: 02/19/2016   History of Present Illness  81 y.o. male admitted to [facility] on 02/16/16 s/p fall with resultant L scalp laceration, L 4-9th rib fx with small PTX, grade 1 splenic lac, and ABL anemia.  Pt with significant PMhx of HTN, dementia, L shoulder RTC rupture, CAD, A-fib, and R shoulder surgery.    Clinical Impression  Pt is very painful and stiff with mobility.  Two people needed for safety, line management, and patient encouragement.  We were only able to get OOB to the recliner chair and had to re-apply O2 via [place] to keep sats >85%.  Pt will likely need post acute rehab prior to d/c home.     Follow Up Recommendations SNF;Supervision/Assistance - 24 hour    Equipment Recommendations  Rolling walker with 5" wheels;3in1 (PT)    Recommendations for Other Services   NA    Precautions / Restrictions Precautions Precautions: Fall Precaution Comments: h/o falls      Mobility  Bed Mobility Overal bed mobility: Needs Assistance Bed Mobility: Supine to Sit     Supine to sit: +2 for physical assistance;Mod assist;HOB elevated     General bed mobility comments: Two person assist to support pt's trunk to get to sitting EOB.  Pt is very rigid and resistant to movement due to pain.  Facilitated him to reach for the bed rail with his right, less painful arm.  Assist needed to both move legs over EOB and to transition trunk to upright sitting.   Transfers Overall transfer level: Needs assistance Equipment used: 2 person hand held assist Transfers: Sit to/from Stand Sit to Stand: From elevated surface;+2 safety/equipment;Mod assist         General transfer comment: Two person (for safety) mod assist to stand from elevated bed.  Pt supported most on his right side and guarded on his left by second person.  Painful transitions.   Ambulation/Gait Ambulation/Gait assistance: +2  safety/equipment;Mod assist Ambulation Distance (Feet): 3 Feet Assistive device: 2 person hand held assist Gait Pattern/deviations: Step-to pattern;Shuffle;Trunk flexed     General Gait Details: Pt took a few short, choppy steps to the recliner chair from the bed.  He needed two person assist for safety to ensure he turned all the way around and to stabilize the chair as he tended to crash down.           Balance Overall balance assessment: Needs assistance Sitting-balance support: Feet supported;Bilateral upper extremity supported Sitting balance-Leahy Scale: Fair Sitting balance - Comments: Once situated EOB with both feet  on the floor he could be supervision.    Standing balance support: Bilateral upper extremity supported Standing balance-Leahy Scale: Poor Standing balance comment: needs external support in static standing and dynamic movements.                             Pertinent Vitals/Pain Pain Assessment: Faces Faces Pain Scale: Hurts whole lot Pain Location: left ribs Pain Descriptors / Indicators: Aching;Burning;Grimacing;Guarding Pain Intervention(s): Limited activity within patient's tolerance;Monitored during session;Repositioned    Home Living Family/patient expects to be discharged to:: Private residence Living Arrangements: Spouse/significant other (per friend wife is independent) Available Help at Discharge: Family;Available 24 hours/day Type of Home: House Home Access: Stairs to enter Entrance Stairs-Rails:  (unsure) Entrance Stairs-Number of Steps: 3 Home Layout: One level        Prior  Function Level of Independence: Independent (per friend he walked stooped forward)         Comments: Pt is a poor historian and friend only able to provide surface info.          Extremity/Trunk Assessment   Upper Extremity Assessment: Defer to OT evaluation           Lower Extremity Assessment: RLE deficits/detail;LLE deficits/detail RLE  Deficits / Details: pt shaky on his feet, short choppy steps to chair, reports pain in left side even with moving left leg to EOB.   LLE Deficits / Details: pt shaky on his feet, short choppy steps to chair, reports pain in left side even with moving left leg to EOB.    Cervical / Trunk Assessment: Other exceptions  Communication   Communication: No difficulties  Cognition Arousal/Alertness: Awake/alert Behavior During Therapy: Anxious Overall Cognitive Status: History of cognitive impairments - at baseline (not sure of baseline)                      General Comments General comments (skin integrity, edema, etc.): Started off without O2 at rest on RA O2 sats WNL.  Then, once seated EOB, Pt started to breathe more shallowly due to pain and O2 sats started to drop into the 80s. 2 L O2 [place] re-applied to his nose for the transfer.         Assessment/Plan    PT Assessment Patient needs continued PT services  PT Problem List Decreased strength;Decreased activity tolerance;Decreased balance;Decreased mobility;Decreased cognition;Decreased knowledge of use of DME;Decreased safety awareness;Cardiopulmonary status limiting activity;Pain          PT Treatment Interventions DME instruction;Gait training;Stair training;Functional mobility training;Therapeutic exercise;Therapeutic activities;Balance training;Cognitive remediation;Patient/family education    PT Goals (Current goals can be found in the Care Plan section)  Acute Rehab PT Goals Patient Stated Goal: pt unable to state PT Goal Formulation: Patient unable to participate in goal setting Time For Goal Achievement: 03/04/16 Potential to Achieve Goals: Good    Frequency Min 3X/week   Barriers to discharge Decreased caregiver support pt lives with elderly wife       End of Session Equipment Utilized During Treatment: Oxygen Activity Tolerance: Patient limited by pain Patient left: in chair;with call bell/phone within  reach;with chair alarm set           Time: 1015-1051 PT Time Calculation (min) (ACUTE ONLY): 36 min   Charges:   PT Evaluation $PT Eval Moderate Complexity: 1 Procedure PT Treatments $Therapeutic Activity: 8-22 mins        Mohab Ashby B. [place], [place], DPT [phone]   02/19/2016, 2:01 PM

## 2016-02-20 DIAGNOSIS — S2242XA Multiple fractures of ribs, left side, initial encounter for closed fracture: Secondary | ICD-10-CM | POA: Diagnosis not present

## 2016-02-20 DIAGNOSIS — I4891 Unspecified atrial fibrillation: Secondary | ICD-10-CM | POA: Diagnosis not present

## 2016-02-20 DIAGNOSIS — S270XXA Traumatic pneumothorax, initial encounter: Secondary | ICD-10-CM | POA: Diagnosis not present

## 2016-02-20 DIAGNOSIS — S36030A Superficial (capsular) laceration of spleen, initial encounter: Secondary | ICD-10-CM | POA: Diagnosis not present

## 2016-02-20 DIAGNOSIS — J90 Pleural effusion, not elsewhere classified: Secondary | ICD-10-CM | POA: Diagnosis not present

## 2016-02-20 DIAGNOSIS — I10 Essential (primary) hypertension: Secondary | ICD-10-CM | POA: Diagnosis not present

## 2016-02-20 DIAGNOSIS — K661 Hemoperitoneum: Secondary | ICD-10-CM | POA: Diagnosis not present

## 2016-02-20 DIAGNOSIS — R319 Hematuria, unspecified: Secondary | ICD-10-CM | POA: Diagnosis not present

## 2016-02-20 DIAGNOSIS — G309 Alzheimer's disease, unspecified: Secondary | ICD-10-CM | POA: Diagnosis not present

## 2016-02-20 DIAGNOSIS — I480 Paroxysmal atrial fibrillation: Secondary | ICD-10-CM | POA: Diagnosis not present

## 2016-02-20 DIAGNOSIS — F0280 Dementia in other diseases classified elsewhere without behavioral disturbance: Secondary | ICD-10-CM | POA: Diagnosis not present

## 2016-02-20 DIAGNOSIS — D62 Acute posthemorrhagic anemia: Secondary | ICD-10-CM | POA: Diagnosis not present

## 2016-02-20 DIAGNOSIS — R310 Gross hematuria: Secondary | ICD-10-CM | POA: Diagnosis not present

## 2016-02-20 DIAGNOSIS — I4510 Unspecified right bundle-branch block: Secondary | ICD-10-CM | POA: Diagnosis not present

## 2016-02-20 LAB — CBC WITH DIFFERENTIAL/PLATELET
BASOS ABS: 0 10*3/uL (ref 0.0–0.1)
Basophils Relative: 0 %
EOS ABS: 0.3 10*3/uL (ref 0.0–0.7)
EOS PCT: 3 %
HCT: 31.2 % — ABNORMAL LOW (ref 39.0–52.0)
Hemoglobin: 10.6 g/dL — ABNORMAL LOW (ref 13.0–17.0)
LYMPHS ABS: 1.3 10*3/uL (ref 0.7–4.0)
Lymphocytes Relative: 14 %
MCH: 31.5 pg (ref 26.0–34.0)
MCHC: 34 g/dL (ref 30.0–36.0)
MCV: 92.6 fL (ref 78.0–100.0)
Monocytes Absolute: 0.8 10*3/uL (ref 0.1–1.0)
Monocytes Relative: 8 %
Neutro Abs: 7.1 10*3/uL (ref 1.7–7.7)
Neutrophils Relative %: 75 %
PLATELETS: 115 10*3/uL — AB (ref 150–400)
RBC: 3.37 MIL/uL — AB (ref 4.22–5.81)
RDW: 13.2 % (ref 11.5–15.5)
WBC: 9.5 10*3/uL (ref 4.0–10.5)

## 2016-02-20 LAB — URINALYSIS, ROUTINE W REFLEX MICROSCOPIC
Glucose, UA: NEGATIVE mg/dL
Ketones, ur: NEGATIVE mg/dL
Nitrite: NEGATIVE
Protein, ur: 30 mg/dL — AB
SPECIFIC GRAVITY, URINE: 1.025 (ref 1.005–1.030)
pH: 5.5 (ref 5.0–8.0)

## 2016-02-20 LAB — URINE MICROSCOPIC-ADD ON

## 2016-02-20 MED ORDER — METOPROLOL TARTRATE 25 MG PO TABS
25.0000 mg | ORAL_TABLET | Freq: Two times a day (BID) | ORAL | Status: DC
  Administered 2016-02-20 – 2016-02-25 (×11): 25 mg via ORAL
  Filled 2016-02-20 (×11): qty 1

## 2016-02-20 NOTE — Progress Notes (Signed)
Rcvd call from telemetry pt has converted to SR, ST.  Strip to be placed in chart.  Made PA aware.  Will continue to monitor.

## 2016-02-20 NOTE — Consult Note (Signed)
Cardiology Consult    Patient ID: [name] MRN: [id number], DOB/AGE: June 26, 1934   Admit date: 02/16/2016 Date of Consult: 02/20/2016  Primary Physician: Madelyn Brunner, MD Reason for Consult: Afib Primary Cardiologist: New Requesting Provider: Dr. Hulen Skains  Patient Profile    [name] is a 81 year old male with a past medical history of carotid artery disease, HLD, HTN, OSA, dementia and atrial fibrillation. He presented to the ED on 02/16/16 after a fall that resulted in a left scalp laceration, multiple rib fractures and probable splenic laceration. He developed atrial fibrillation and cardiology was consulted.   History of Present Illness    [name] was at church on 02/16/16 and was trying to collect his trumpet but the lighting was bad and he was rummaging around in the dark with a flashlight and next thing he knew he fell. He was seen by the Trauma service here at [facility], his C-spine was cleared and he was treated without surgical intervention.   The morning of 02/20/16 he developed rapid afib with rates in the 150's. History is largely provided by his wife as the patient has advanced dementia. She tells me he was asymptomatic at the time. Did not complain of palpitations, SOB or chest pain. EKG done and showed atrial fibrillation with RVR with RBBB.   He has been told before that he has PAF in 2016, anticoagulation was not pursued and he was not on any AV nodal agents or anti-arrhythmics prior to admission. He does take a daily 81mg  ASA. He has no history of CAD, Stroke or TIA.   CT of his abdomen noted CT there was mention of bilateral renal cysts, mild fluid stranding noted within the left retroperitoneum below the left kidney, and a small amount of intraperitoneal free fluid which was hyperdense and consistent with acute hemorrhage, but no identifiable source. His hgb has been stable for 3 days.     Past Medical History   Past Medical History:    Diagnosis Date  . Absolute anemia 09/11/2014  . Anxiety   . Atrial fibrillation ([place]) 01/23/2014  . BP (high blood pressure) 11/07/2013  . Carotid artery disease ([place]) 09/11/2014  . Complete rotator cuff rupture of left shoulder 12/30/2013  . Dementia 09/11/2014  . Depression   . Heart murmur   . HLD (hyperlipidemia) 01/23/2014  . Hypertension   . Obstructive apnea 11/07/2013  . Personal history of other diseases of the circulatory system 01/23/2014  . Sleep apnea   . 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 Back Surgery- Tumor Removal    . SHOULDER SURGERY Right 2007     Allergies  Allergies  Allergen Reactions  . Morphine And Related Other (See Comments)    Severe confusion    Inpatient Medications    . amLODipine  5 mg Oral Daily  . aspirin EC  81 mg Oral Daily  . Chlorhexidine Gluconate Cloth  6 each Topical Q0600  . docusate sodium  100 mg Oral BID  . donepezil  10 mg Oral QHS  . feeding supplement (ENSURE ENLIVE)  237 mL Oral BID BM  . memantine  5 mg Oral BID  . mupirocin ointment  1 application Nasal BID  . polyethylene glycol  17 g Oral Daily  . tamsulosin  0.4 mg Oral Daily    Family History    Family History  Problem Relation  Age of Onset  . Bladder Cancer Neg Hx   . Prostate cancer Neg Hx   . Kidney cancer Neg Hx     Social History    Social History   Social History  . Marital status: Married    Spouse name: N/A  . Number of children: N/A  . Years of education: N/A   Occupational History  . Not on file.   Social History Main Topics  . Smoking status: Former Research scientist (life sciences)  . Smokeless tobacco: Never Used  . Alcohol use No  . Drug use: No  . Sexual activity: Not on file   Other Topics Concern  . Not on file   Social History Narrative  . No narrative on file     Review of Systems    General:  No chills, fever, night sweats or weight changes.  Cardiovascular:  No chest pain, dyspnea  on exertion, edema, orthopnea, palpitations, paroxysmal nocturnal dyspnea. Dermatological: No rash, lesions/masses Respiratory: No cough, dyspnea Urologic: No hematuria, dysuria Abdominal:   No nausea, vomiting, diarrhea, bright red blood per rectum, melena, or hematemesis Neurologic:  No visual changes, wkns, changes in mental status. All other systems reviewed and are otherwise negative except as noted above.  Physical Exam    Blood pressure (!) 91/51, pulse (!) 108, temperature 98.9 F (37.2 C), temperature source Oral, resp. rate 20, height 5\' 10"  (1.778 m), weight 98 lb (44.5 kg), SpO2 93 %.  General: Pleasant, NAD Psych: Normal affect. Neuro: Alert and oriented X 3. Moves all extremities spontaneously. HEENT: Normal  Neck: Supple without bruits or JVD. Lungs:  Resp regular and unlabored, CTA. Heart: RRR no s3, s4, or murmurs. Abdomen: Soft, non-tender, non-distended, BS + x 4.  Extremities: No clubbing, cyanosis or edema. DP/PT/Radials 2+ and equal bilaterally.  Labs     Lab Results  Component Value Date   WBC 9.5 02/20/2016   HGB 10.6 (L) 02/20/2016   HCT 31.2 (L) 02/20/2016   MCV 92.6 02/20/2016   PLT 115 (L) 02/20/2016    Recent Labs Lab 02/17/16 0415  NA 140  K 4.1  CL 113*  CO2 23  BUN 17  CREATININE 1.07  CALCIUM 7.5*  PROT 4.9*  BILITOT 1.1  ALKPHOS 39  ALT 14*  AST 27  GLUCOSE 120*     Radiology Studies    Dg Chest 1 View  Result Date: 02/16/2016 CLINICAL DATA:  Fall EXAM: CHEST 1 VIEW COMPARISON:  Chest x-ray dated 04/20/2013. FINDINGS: Heart size and mediastinal contours are stable. Atherosclerotic calcifications again noted at the aortic arch. Calcified pleural plaques again noted bilaterally. Lungs otherwise clear. No new lung findings. No pleural effusion or pneumothorax seen. There is a slightly displaced fracture of the left lateral eighth rib. There is an additional slightly displaced fracture of the left posterior fourth rib. IMPRESSION:  1. Slightly displaced acute-appearing fractures of the posterior left fourth rib and lateral left eighth rib. 2. 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 No new lung findings. No pleural effusion or pneumothorax seen. 3. Aortic atherosclerosis. Electronically Signed   By: Franki Cabot M.D.   On: 02/16/2016 14:23   Dg Elbow Complete Left  Result Date: 02/16/2016 CLINICAL DATA:  Pain after fall. EXAM: LEFT ELBOW - COMPLETE 3+ VIEW COMPARISON:  None. FINDINGS: There is no evidence of fracture, dislocation, or joint effusion. There is no evidence of arthropathy or other focal bone abnormality. Soft tissues are unremarkable. IMPRESSION: Negative. Electronically Signed   By: Shanon Brow  Jimmye Norman III M.D   On: 02/16/2016 14:21   Ct Head Wo Contrast  Result Date: 02/16/2016 CLINICAL DATA:  Unwitnessed fall at church hitting back of head. History dementia. EXAM: CT HEAD WITHOUT CONTRAST CT CERVICAL SPINE WITHOUT CONTRAST TECHNIQUE: Multidetector CT imaging of the head and cervical spine was performed following the standard protocol without intravenous contrast. Multiplanar CT image reconstructions of the cervical spine were also generated. COMPARISON:  None. FINDINGS: CT HEAD FINDINGS Brain: Age advanced atrophy with sulcal prominence and centralized volume loss with commensurate ex vacuo dilatation of the ventricular system. Scattered periventricular hypodensities compatible with microvascular ischemic disease. Given background parenchymal abnormalities, there is no CT evidence of superimposed acute large territory infarct. No intraparenchymal or extra-axial mass or hemorrhage. Normal configuration of the ventricles and basilar cisterns. No midline shift. Vascular: No hyperdense vessel or unexpected calcification. Skull: No displaced calvarial fracture with special attention paid to the high right posterior parietal calvarium. Sinuses/Orbits: Limited visualization the paranasal sinuses  and mastoid air cells is normal. No air-fluid levels. Post bilateral cataract surgery. Other: Scattered punctate foci of subcutaneous emphysema are noted about the high right posterior parietal calvarium (representative images 29 and 30, series 2) with minimal amount of adjacent subcutaneous edema. No radiopaque foreign body. CT CERVICAL SPINE FINDINGS Alignment: C1 to the superior endplate of T2 is imaged. Normal alignment of the cervical spine. No anterolisthesis or retrolisthesis. The bilateral facets are normally aligned. Skull base and vertebrae: The dens is normally positioned between the lateral masses of C1. Moderate degenerative change of the atlantodental articulation with several adjacent punctate ossicles. Normal atlanto axial articulations. Soft tissues and spinal canal: Prevertebral soft tissues are normal. Disc levels: Mild-to-moderate multilevel cervical spine DDD, worse at C3-C4 with disc space height loss, endplate irregularity and sclerosis. Upper chest: Limited visualization of the lung apices is normal. Other: Calcified atherosclerotic plaque within the bilateral carotid bulbs. There is mild diffuse heterogeneity of thyroid parenchyma. No bulky cervical lymphadenopathy on this noncontrast examination. IMPRESSION: 1. Suspected laceration about the high right posterior parietal calvarium without associated radiopaque foreign body, displaced calvarial fracture or acute intracranial process. 2. No fracture or static subluxation of the cervical spine. 3. Age advanced atrophy and mild microvascular ischemic disease. 4. Mild to moderate multilevel cervical spine DDD, worse at C3-C4. Electronically Signed   By: Sandi Mariscal M.D.   On: 02/16/2016 13:27   Ct Chest W Contrast  Result Date: 02/16/2016 CLINICAL DATA:  Pt arrived to ED after an unwitnessed fall at church. Per EMS the pt fell 63ft from church stage and hit his head. Pt also had c/o of left side rib pain. EXAM: CT CHEST, ABDOMEN, AND PELVIS  WITH CONTRAST CT THORACIC SPINE without contrast TECHNIQUE: Multidetector CT imaging of the chest, abdomen and pelvis was performed following the standard protocol during bolus administration of intravenous contrast. Multi detector CT images of the thoracic spine were performed with standard reconstructions. CONTRAST:  131mL ISOVUE-300 IOPAMIDOL (ISOVUE-300) INJECTION 61% COMPARISON:  CT abdomen dated 02/15/2009. Chest x-ray from earlier today. FINDINGS: CT CHEST FINDINGS Cardiovascular: Heart size is normal. Thoracic aorta appears intact and normal in caliber throughout. Scattered atherosclerotic changes of the thoracic aorta. Coronary artery calcifications noted. Mediastinum/Nodes: No hemorrhage or edema appreciated within the mediastinum. Scattered small lymph nodes within the mediastinum. Trachea and esophagus are unremarkable. Lungs/Pleura: Calcified pleural plaques noted bilaterally. Associated soft tissue thickening at the sites of the pleural plaques, without asymmetrically prominent or irregular soft tissue mass at any site. Mild  atelectasis and small pleural effusion on the left. No pneumothorax seen, although there are small foci of air within the lower left epicardial fat, presumably related to occult tiny pneumothorax. Musculoskeletal: There are multiple displaced fractures throughout the left ribs. This includes slightly displaced fractures within the lateral left fourth through ninth ribs. More significantly displaced fractures are seen within the posterior left fourth, sixth, seventh, eighth, and ninth ribs. Additional minimally displaced fracture noted within the posterior left tenth rib. CT ABDOMEN PELVIS FINDINGS Hepatobiliary: Liver and gallbladder appear normal. Small amount of free fluid noted adjacent to the liver. Pancreas: Unremarkable. No pancreatic ductal dilatation or surrounding inflammatory changes. Spleen: Spleen appears normal. Small amount of hyperdense free fluid adjacent to the  spleen. Adrenals/Urinary Tract: Adrenal glands appear normal. Bilateral renal cysts. Kidneys otherwise unremarkable. No ureteral or bladder calculi identified. Mild fluid stranding noted within the left retroperitoneum, below the left kidney. Stomach/Bowel: Bowel is normal in caliber. No bowel wall thickening or evidence of bowel wall inflammation. Appendix is normal. Stomach appears normal. Vascular/Lymphatic: Aortic atherosclerosis. No acute appearing vascular abnormality. No enlarged lymph nodes seen. Reproductive: Mild prostate gland enlargement. Other: Small amount of additional hyperdense free fluid in the lower pelvis, likely blood products. No active hemorrhage/contrast extravasation identified. Musculoskeletal: Degenerative changes throughout the thoracolumbar spine, mild to moderate in degree. No osseous fracture or dislocation identified in the abdomen or pelvis. Superficial soft tissues are unremarkable. CT THORACIC SPINE: Alignment: Mild scoliosis.  No acute subluxation. Vertebrae: No fracture line or displaced fracture fragment identified. Facets appear intact and normally aligned throughout. Paraspinal and other soft tissues: The immediate paravertebral soft tissues are unremarkable. No evidence of central canal hematoma. Disc levels: No significant degenerative change at any level. No significant central canal stenosis. IMPRESSION: 1. Multiple displaced fractures of the left ribs, as detailed above, including fractures within both the lateral and posterior portions of the fourth, sixth, seventh, eighth and ninth ribs (most significant fracture displacement within the posterior ribs). This certainly places the patient at risk for development of flail chest syndrome. 2. No discrete pneumothorax seen. There are, however, 2 punctate foci of air within the lower left epicardial fat which likely indicates an occult tiny pneumothorax on the left. Also associated mild atelectasis and small effusion at the  left lung base. 3. Small amount of hyperdense free fluid, compatible with acute hemorrhage, in the upper abdomen and lower pelvis. No source for the hemorrhage identified. No solid organ laceration identified. Suspect occult surface laceration of the spleen versus mesenteric venous bleed. No active hemorrhage/contrast extravasation identified on this exam. 4. No additional acute appearing findings within the chest, abdomen or pelvis. 5. No fracture or acute subluxation within the thoracic spine. 6. 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 7. Aortic atherosclerosis. 8. Additional chronic/incidental findings detailed above. Critical Value/emergent results were called by telephone at the time of interpretation on 02/16/2016 at 4:43 pm to Dr. Eula Listen , who verbally acknowledged these results. Electronically Signed   By: Franki Cabot M.D.   On: 02/16/2016 16:47   Ct Cervical Spine Wo Contrast  Result Date: 02/16/2016 CLINICAL DATA:  Unwitnessed fall at church hitting back of head. History dementia. EXAM: CT HEAD WITHOUT CONTRAST CT CERVICAL SPINE WITHOUT CONTRAST TECHNIQUE: Multidetector CT imaging of the head and cervical spine was performed following the standard protocol without intravenous contrast. Multiplanar CT image reconstructions of the cervical spine were also generated. COMPARISON:  None. FINDINGS: CT HEAD FINDINGS Brain: Age advanced atrophy with  sulcal prominence and centralized volume loss with commensurate ex vacuo dilatation of the ventricular system. Scattered periventricular hypodensities compatible with microvascular ischemic disease. Given background parenchymal abnormalities, there is no CT evidence of superimposed acute large territory infarct. No intraparenchymal or extra-axial mass or hemorrhage. Normal configuration of the ventricles and basilar cisterns. No midline shift. Vascular: No hyperdense vessel or unexpected calcification. Skull: No  displaced calvarial fracture with special attention paid to the high right posterior parietal calvarium. Sinuses/Orbits: Limited visualization the paranasal sinuses and mastoid air cells is normal. No air-fluid levels. Post bilateral cataract surgery. Other: Scattered punctate foci of subcutaneous emphysema are noted about the high right posterior parietal calvarium (representative images 29 and 30, series 2) with minimal amount of adjacent subcutaneous edema. No radiopaque foreign body. CT CERVICAL SPINE FINDINGS Alignment: C1 to the superior endplate of T2 is imaged. Normal alignment of the cervical spine. No anterolisthesis or retrolisthesis. The bilateral facets are normally aligned. Skull base and vertebrae: The dens is normally positioned between the lateral masses of C1. Moderate degenerative change of the atlantodental articulation with several adjacent punctate ossicles. Normal atlanto axial articulations. Soft tissues and spinal canal: Prevertebral soft tissues are normal. Disc levels: Mild-to-moderate multilevel cervical spine DDD, worse at C3-C4 with disc space height loss, endplate irregularity and sclerosis. Upper chest: Limited visualization of the lung apices is normal. Other: Calcified atherosclerotic plaque within the bilateral carotid bulbs. There is mild diffuse heterogeneity of thyroid parenchyma. No bulky cervical lymphadenopathy on this noncontrast examination. IMPRESSION: 1. Suspected laceration about the high right posterior parietal calvarium without associated radiopaque foreign body, displaced calvarial fracture or acute intracranial process. 2. No fracture or static subluxation of the cervical spine. 3. Age advanced atrophy and mild microvascular ischemic disease. 4. Mild to moderate multilevel cervical spine DDD, worse at C3-C4. Electronically Signed   By: Sandi Mariscal M.D.   On: 02/16/2016 13:27   Ct Thoracic Spine Wo Contrast  Result Date: 02/16/2016 CLINICAL DATA:  Pt arrived to  ED after an unwitnessed fall at church. Per EMS the pt fell 78ft from church stage and hit his head. Pt also had c/o of left side rib pain. EXAM: CT CHEST, ABDOMEN, AND PELVIS WITH CONTRAST CT THORACIC SPINE without contrast TECHNIQUE: Multidetector CT imaging of the chest, abdomen and pelvis was performed following the standard protocol during bolus administration of intravenous contrast. Multi detector CT images of the thoracic spine were performed with standard reconstructions. CONTRAST:  164mL ISOVUE-300 IOPAMIDOL (ISOVUE-300) INJECTION 61% COMPARISON:  CT abdomen dated 02/15/2009. Chest x-ray from earlier today. FINDINGS: CT CHEST FINDINGS Cardiovascular: Heart size is normal. Thoracic aorta appears intact and normal in caliber throughout. Scattered atherosclerotic changes of the thoracic aorta. Coronary artery calcifications noted. Mediastinum/Nodes: No hemorrhage or edema appreciated within the mediastinum. Scattered small lymph nodes within the mediastinum. Trachea and esophagus are unremarkable. Lungs/Pleura: Calcified pleural plaques noted bilaterally. Associated soft tissue thickening at the sites of the pleural plaques, without asymmetrically prominent or irregular soft tissue mass at any site. Mild atelectasis and small pleural effusion on the left. No pneumothorax seen, although there are small foci of air within the lower left epicardial fat, presumably related to occult tiny pneumothorax. Musculoskeletal: There are multiple displaced fractures throughout the left ribs. This includes slightly displaced fractures within the lateral left fourth through ninth ribs. More significantly displaced fractures are seen within the posterior left fourth, sixth, seventh, eighth, and ninth ribs. Additional minimally displaced fracture noted within the posterior left tenth rib. CT  ABDOMEN PELVIS FINDINGS Hepatobiliary: Liver and gallbladder appear normal. Small amount of free fluid noted adjacent to the liver.  Pancreas: Unremarkable. No pancreatic ductal dilatation or surrounding inflammatory changes. Spleen: Spleen appears normal. Small amount of hyperdense free fluid adjacent to the spleen. Adrenals/Urinary Tract: Adrenal glands appear normal. Bilateral renal cysts. Kidneys otherwise unremarkable. No ureteral or bladder calculi identified. Mild fluid stranding noted within the left retroperitoneum, below the left kidney. Stomach/Bowel: Bowel is normal in caliber. No bowel wall thickening or evidence of bowel wall inflammation. Appendix is normal. Stomach appears normal. Vascular/Lymphatic: Aortic atherosclerosis. No acute appearing vascular abnormality. No enlarged lymph nodes seen. Reproductive: Mild prostate gland enlargement. Other: Small amount of additional hyperdense free fluid in the lower pelvis, likely blood products. No active hemorrhage/contrast extravasation identified. Musculoskeletal: Degenerative changes throughout the thoracolumbar spine, mild to moderate in degree. No osseous fracture or dislocation identified in the abdomen or pelvis. Superficial soft tissues are unremarkable. CT THORACIC SPINE: Alignment: Mild scoliosis.  No acute subluxation. Vertebrae: No fracture line or displaced fracture fragment identified. Facets appear intact and normally aligned throughout. Paraspinal and other soft tissues: The immediate paravertebral soft tissues are unremarkable. No evidence of central canal hematoma. Disc levels: No significant degenerative change at any level. No significant central canal stenosis. IMPRESSION: 1. Multiple displaced fractures of the left ribs, as detailed above, including fractures within both the lateral and posterior portions of the fourth, sixth, seventh, eighth and ninth ribs (most significant fracture displacement within the posterior ribs). This certainly places the patient at risk for development of flail chest syndrome. 2. No discrete pneumothorax seen. There are, however, 2  punctate foci of air within the lower left epicardial fat which likely indicates an occult tiny pneumothorax on the left. Also associated mild atelectasis and small effusion at the left lung base. 3. Small amount of hyperdense free fluid, compatible with acute hemorrhage, in the upper abdomen and lower pelvis. No source for the hemorrhage identified. No solid organ laceration identified. Suspect occult surface laceration of the spleen versus mesenteric venous bleed. No active hemorrhage/contrast extravasation identified on this exam. 4. No additional acute appearing findings within the chest, abdomen or pelvis. 5. No fracture or acute subluxation within the thoracic spine. 6. 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 7. Aortic atherosclerosis. 8. Additional chronic/incidental findings detailed above. Critical Value/emergent results were called by telephone at the time of interpretation on 02/16/2016 at 4:43 pm to Dr. Eula Listen , who verbally acknowledged these results. Electronically Signed   By: Franki Cabot M.D.   On: 02/16/2016 16:47   Ct Abdomen Pelvis W Contrast  Result Date: 02/16/2016 CLINICAL DATA:  Pt arrived to ED after an unwitnessed fall at church. Per EMS the pt fell 71ft from church stage and hit his head. Pt also had c/o of left side rib pain. EXAM: CT CHEST, ABDOMEN, AND PELVIS WITH CONTRAST CT THORACIC SPINE without contrast TECHNIQUE: Multidetector CT imaging of the chest, abdomen and pelvis was performed following the standard protocol during bolus administration of intravenous contrast. Multi detector CT images of the thoracic spine were performed with standard reconstructions. CONTRAST:  165mL ISOVUE-300 IOPAMIDOL (ISOVUE-300) INJECTION 61% COMPARISON:  CT abdomen dated 02/15/2009. Chest x-ray from earlier today. FINDINGS: CT CHEST FINDINGS Cardiovascular: Heart size is normal. Thoracic aorta appears intact and normal in caliber throughout.  Scattered atherosclerotic changes of the thoracic aorta. Coronary artery calcifications noted. Mediastinum/Nodes: No hemorrhage or edema appreciated within the mediastinum. Scattered small lymph nodes within the  mediastinum. Trachea and esophagus are unremarkable. Lungs/Pleura: Calcified pleural plaques noted bilaterally. Associated soft tissue thickening at the sites of the pleural plaques, without asymmetrically prominent or irregular soft tissue mass at any site. Mild atelectasis and small pleural effusion on the left. No pneumothorax seen, although there are small foci of air within the lower left epicardial fat, presumably related to occult tiny pneumothorax. Musculoskeletal: There are multiple displaced fractures throughout the left ribs. This includes slightly displaced fractures within the lateral left fourth through ninth ribs. More significantly displaced fractures are seen within the posterior left fourth, sixth, seventh, eighth, and ninth ribs. Additional minimally displaced fracture noted within the posterior left tenth rib. CT ABDOMEN PELVIS FINDINGS Hepatobiliary: Liver and gallbladder appear normal. Small amount of free fluid noted adjacent to the liver. Pancreas: Unremarkable. No pancreatic ductal dilatation or surrounding inflammatory changes. Spleen: Spleen appears normal. Small amount of hyperdense free fluid adjacent to the spleen. Adrenals/Urinary Tract: Adrenal glands appear normal. Bilateral renal cysts. Kidneys otherwise unremarkable. No ureteral or bladder calculi identified. Mild fluid stranding noted within the left retroperitoneum, below the left kidney. Stomach/Bowel: Bowel is normal in caliber. No bowel wall thickening or evidence of bowel wall inflammation. Appendix is normal. Stomach appears normal. Vascular/Lymphatic: Aortic atherosclerosis. No acute appearing vascular abnormality. No enlarged lymph nodes seen. Reproductive: Mild prostate gland enlargement. Other: Small amount of  additional hyperdense free fluid in the lower pelvis, likely blood products. No active hemorrhage/contrast extravasation identified. Musculoskeletal: Degenerative changes throughout the thoracolumbar spine, mild to moderate in degree. No osseous fracture or dislocation identified in the abdomen or pelvis. Superficial soft tissues are unremarkable. CT THORACIC SPINE: Alignment: Mild scoliosis.  No acute subluxation. Vertebrae: No fracture line or displaced fracture fragment identified. Facets appear intact and normally aligned throughout. Paraspinal and other soft tissues: The immediate paravertebral soft tissues are unremarkable. No evidence of central canal hematoma. Disc levels: No significant degenerative change at any level. No significant central canal stenosis. IMPRESSION: 1. Multiple displaced fractures of the left ribs, as detailed above, including fractures within both the lateral and posterior portions of the fourth, sixth, seventh, eighth and ninth ribs (most significant fracture displacement within the posterior ribs). This certainly places the patient at risk for development of flail chest syndrome. 2. No discrete pneumothorax seen. There are, however, 2 punctate foci of air within the lower left epicardial fat which likely indicates an occult tiny pneumothorax on the left. Also associated mild atelectasis and small effusion at the left lung base. 3. Small amount of hyperdense free fluid, compatible with acute hemorrhage, in the upper abdomen and lower pelvis. No source for the hemorrhage identified. No solid organ laceration identified. Suspect occult surface laceration of the spleen versus mesenteric venous bleed. No active hemorrhage/contrast extravasation identified on this exam. 4. No additional acute appearing findings within the chest, abdomen or pelvis. 5. No fracture or acute subluxation within the thoracic spine. 6. 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 7.  Aortic atherosclerosis. 8. Additional chronic/incidental findings detailed above. Critical Value/emergent results were called by telephone at the time of interpretation on 02/16/2016 at 4:43 pm to Dr. Eula Listen , who verbally acknowledged these results. Electronically Signed   By: Franki Cabot M.D.   On: 02/16/2016 16:47   Dg Chest Port 1 View  Result Date: 02/19/2016 CLINICAL DATA:  Shortness of Breath EXAM: PORTABLE CHEST 1 VIEW COMPARISON:  10/15/ 17 FINDINGS: Cardiomediastinal silhouette is stable. Calcified pleural plaques bilaterally again noted. Persistent small left pleural  effusion. Bilateral basilar hazy atelectasis or infiltrate. No pulmonary edema. IMPRESSION: Calcified pleural plaques bilaterally again noted. Persistent small left pleural effusion. Bilateral basilar hazy atelectasis or infiltrate. No pulmonary edema. Electronically Signed   By: Lahoma Crocker M.D.   On: 02/19/2016 13:48   Dg Chest Port 1 View  Result Date: 02/17/2016 CLINICAL DATA:  Multiple left rib fractures. EXAM: PORTABLE CHEST 1 VIEW COMPARISON:  Radiographs and CT CT yesterday. FINDINGS: Left pleural effusion and left lung base opacity are increased from prior radiograph. Left rib fractures, better assessed on CT. No evidence of developing pneumothorax. There are bilateral irregular calcified pleural plaques. Unchanged heart size and mediastinal contours. IMPRESSION: Left rib fractures. Left pleural effusion and basilar opacity, mildly progressed from prior radiograph. Electronically Signed   By: Jeb Levering M.D.   On: 02/17/2016 06:01   Dg Cerv Spine Flex&ext Only  Result Date: 02/18/2016 CLINICAL DATA:  Fall several days ago.  Neck pain. EXAM: CERVICAL SPINE - FLEXION AND EXTENSION VIEWS ONLY COMPARISON:  Cervical spine CT on 02/16/2016 FINDINGS: No evidence cervical spine subluxation seen on flexion or extension views. Moderate degenerative disc disease again seen at C3-4, with mild degenerative disc  disease seen at other lumbar levels. No acute fracture or other bone lesions identified. No evidence of prevertebral soft tissue swelling. IMPRESSION: Cervical spine degenerative disc disease. No evidence of cervical spine subluxation on flexion or extension views. Electronically Signed   By: Earle Gell M.D.   On: 02/18/2016 14:06    EKG & Cardiac Imaging    EKG: afib with RBBB    Assessment & Plan   1. Paroxsymal atrial fibrillation: Developed rapid afib this am and has converted spontaneously to NSR. He has a remote history of PAF noted in an office visit from Nov. 2016 but at that time was not on anticoagulation or AV nodal blocking agents.   Would start 25mg  Metoprolol BID. Regarding anticoagulation, would not start on NOAC or coumadin given dementia, recent fall and intraperitoneal free fluid that cold be representative of hemorrhage. He is on daily 81mg  ASA, which can be continued as his Hgb is stable, however his wife says that he gets frequent nose bleeds on ASA.   This patients CHA2DS2-VASc Score and unadjusted Ischemic Stroke Rate (% per year) is equal to 2.2 % stroke rate/year from a score of 2 Above score calculated as 1 point each if present [CHF, HTN, DM, Vascular=MI/PAD/Aortic Plaque, Age if 65-74, or Male], 2 points each if present [Age > 75, or Stroke/TIA/TE]  2. HTN: See above, will add metoprolol. His BP is well controlled.   3. Fall s/p left scalp lac and rib fractures with small pneumo: management per primary team.     Signed, Arbutus Leas, NP 02/20/2016, 1:20 PM Pager: [phone]  I have personally seen and examined this patient with Jettie Booze, NP. I agree with the assessment and plan as outlined above. He had onset of atrial fib this am, history of PAF. My exam shows a pleasant, somnolent male. NAD. CV:RRR. Lungs: clear bilaterally. Ext: no edema. Labs reviewed. EKG reviewed by me. Not a candidate for anticoagulation given dementia, falls and recent traumatic  internal bleeding. He is on ASA. Now in sinus. Would start metoprolol 25 mg po BID. No further cardiac workup given his overall poor functional state and dementia.   Lauree Chandler 02/20/2016 2:54 PM

## 2016-02-20 NOTE — Progress Notes (Signed)
Patient ID: [name], male   DOB: [birth year], 81 y.o.   MRN: [id number]   LOS: 4 days   Subjective: RN reports early this morning pt has been in afib w/RVR w/HR's in the 140's. BP soft. Wife notes some blood around his penis, seems to be coming from his meatus.   Objective: Vital signs in last 24 hours: Temp:  [97.9 F (36.6 C)-99.6 F (37.6 C)] 98.9 F (37.2 C) (10/18 0455) Pulse Rate:  [78-103] 85 (10/18 0455) Resp:  [20-31] 20 (10/18 0455) BP: (117-144)/(57-76) 117/57 (10/18 0455) SpO2:  [90 %-100 %] 93 % (10/18 0455) Weight:  [44.5 kg (98 lb)] 44.5 kg (98 lb) (10/17 2256) Last BM Date: 02/15/16   Laboratory  CBC  Recent Labs  02/18/16 0530 02/20/16 0500  WBC  --  9.5  HGB 11.2* 10.6*  HCT 33.0* 31.2*  PLT  --  115*    Physical Exam General appearance: alert and no distress Resp: clear to auscultation bilaterally Cardio: irregularly irregular rhythm and tachycardia GI: normal findings: bowel sounds normal and soft, non-tender  GU: No obvious abnormality   Assessment/Plan: Fall Left scalp laceration Left fourth through ninth rib fractures with small pneumothorax -- Pulmonary toilet Grade 1 splenic lac ABL anemia -- Still mild Multiple medical problems -- Home meds, will get EKG and have cardiology consult FEN -- Check UA VTE -- SCD's Dispo -- PT/OT    Lisette Abu, PA-C Pager: [phone] General Trauma PA Pager: [phone]  02/20/2016

## 2016-02-20 NOTE — Progress Notes (Signed)
Patients wife will place patient on CPAP when he is ready. RT will continue to monitor.

## 2016-02-20 NOTE — Clinical Social Work Placement (Signed)
   CLINICAL SOCIAL WORK PLACEMENT  NOTE  Date:  02/20/2016  Patient Details  Name: [name] MRN: [id number] Date of Birth: March 27, 1935  Clinical Social Work is seeking post-discharge placement for this patient at the Scalp Level level of care (*CSW will initial, date and re-position this form in  chart as items are completed):  Yes   Patient/family provided with [facility] Work Department's list of facilities offering this level of care within the geographic area requested by the patient (or if unable, by the patient's family).  Yes   Patient/family informed of their freedom to choose among providers that offer the needed level of care, that participate in Medicare, Medicaid or managed care program needed by the patient, have an available bed and are willing to accept the patient.  Yes   Patient/family informed of [facility]'s ownership interest in [facility] and [facility], as well as of the fact that they are under no obligation to receive care at these facilities.  PASRR submitted to EDS on 02/20/16     PASRR number received on 02/20/16     Existing PASRR number confirmed on       FL2 transmitted to all facilities in geographic area requested by pt/family on 02/20/16     FL2 transmitted to all facilities within larger geographic area on       Patient informed that his/her managed care company has contracts with or will negotiate with certain facilities, including the following:            Patient/family informed of bed offers received.  Patient chooses bed at       Physician recommends and patient chooses bed at      Patient to be transferred to   on  .  Patient to be transferred to facility by       Patient family notified on   of transfer.  Name of family member notified:        PHYSICIAN       Additional Comment:    _______________________________________________ Dulcy Fanny, LCSW 02/20/2016, 11:51 AM

## 2016-02-20 NOTE — NC FL2 (Signed)
[place] LEVEL OF CARE SCREENING TOOL     IDENTIFICATION  Patient Name: [name] Birthdate: [birth year] Sex: male Admission Date (Current Location): 02/16/2016  [facility] and [place] Number:  Engineering geologist and Address:         Provider Number: [id number]  Attending Physician Name and Address:  Trauma Md, MD  Relative Name and Phone Number:       Current Level of Care: Hospital Recommended Level of Care: [facility] Prior Approval Number:    Date Approved/Denied:   PASRR Number: [id number]  Discharge Plan: SNF    Current Diagnoses: Patient Active Problem List   Diagnosis Date Noted  . Fall 02/18/2016  . Scalp laceration 02/18/2016  . Splenic laceration 02/18/2016  . Acute blood loss anemia 02/18/2016  . Multiple rib fractures 02/16/2016  . Mild dementia 04/10/2015  . Aggrieved 03/02/2015  . Absolute anemia 09/11/2014  . Carotid artery disease ([place]) 09/11/2014  . Dementia 09/11/2014  . History of repair of rotator cuff 03/02/2014  . Atrial fibrillation ([place]) 01/23/2014  . Personal history of other diseases of the circulatory system 01/23/2014  . HLD (hyperlipidemia) 01/23/2014  . Complete rotator cuff rupture of left shoulder 12/30/2013  . BP (high blood pressure) 11/07/2013  . Amnesia 11/07/2013  . Obstructive apnea 11/07/2013    Orientation RESPIRATION BLADDER Height & Weight     Self  O2 (CPAP) Continent Weight: 98 lb (44.5 kg) Height:  5\' 10"  (177.8 cm)  BEHAVIORAL SYMPTOMS/MOOD NEUROLOGICAL BOWEL NUTRITION STATUS      Continent Diet (Reg)  AMBULATORY STATUS COMMUNICATION OF NEEDS Skin   Limited Assist Verbally Skin abrasions, Bruising (laceration to head)                       Personal Care Assistance Level of Assistance  Bathing, Dressing           Functional Limitations Info             SPECIAL CARE FACTORS FREQUENCY  PT (By licensed PT), OT (By licensed OT)     PT Frequency: daily OT  Frequency: daily            Contractures Contractures Info: Not present    Additional Factors Info  Allergies, Isolation Precautions   Allergies Info: Morphine and Related     Isolation Precautions Info: MRSA     Current Medications (02/20/2016):  This is the current hospital active medication list Current Facility-Administered Medications  Medication Dose Route Frequency Provider Last Rate Last Dose  . 0.9 % NaCl with KCl 20 mEq/ L  infusion   Intravenous Continuous Judeth Horn, MD 20 mL/hr at 02/19/16 1028 20 mL/hr at 02/19/16 1028  . amLODipine (NORVASC) tablet 5 mg  5 mg Oral Daily Greer Pickerel, MD   5 mg at 02/19/16 1020  . aspirin EC tablet 81 mg  81 mg Oral Daily Judeth Horn, MD   81 mg at 02/20/16 0903  . Chlorhexidine Gluconate Cloth 2 % PADS 6 each  6 each Topical Q0600 Rolm Bookbinder, MD   6 each at 02/20/16 0531  . docusate sodium (COLACE) capsule 100 mg  100 mg Oral BID Greer Pickerel, MD   100 mg at 02/20/16 0903  . donepezil (ARICEPT) tablet 10 mg  10 mg Oral QHS Greer Pickerel, MD   10 mg at 02/19/16 2151  . feeding supplement (ENSURE ENLIVE) (ENSURE ENLIVE) liquid 237 mL  237 mL Oral BID BM Legrand Como  Bard Herbert, PA-C   237 mL at 02/20/16 1027  . haloperidol lactate (HALDOL) injection 5 mg  5 mg Intravenous Q6H PRN Lisette Abu, PA-C      . memantine [facility]) tablet 5 mg  5 mg Oral BID Greer Pickerel, MD   5 mg at 02/20/16 0904  . methocarbamol (ROBAXIN) tablet 500 mg  500 mg Oral Q8H PRN Greer Pickerel, MD   500 mg at 02/20/16 0546  . mupirocin ointment (BACTROBAN) 2 % 1 application  1 application Nasal BID Rolm Bookbinder, MD   1 application at [id number]  . ondansetron (ZOFRAN) tablet 4 mg  4 mg Oral Q6H PRN Greer Pickerel, MD       Or  . ondansetron [facility]) injection 4 mg  4 mg Intravenous Q6H PRN Greer Pickerel, MD   4 mg at 02/19/16 2152  . polyethylene glycol (MIRALAX / GLYCOLAX) packet 17 g  17 g Oral Daily Lisette Abu, PA-C   17 g at 02/20/16 [id number]  .  tamsulosin (FLOMAX) capsule 0.4 mg  0.4 mg Oral Daily Greer Pickerel, MD   0.4 mg at 02/20/16 0903  . traMADol (ULTRAM) tablet 50-100 mg  50-100 mg Oral Q6H PRN Lisette Abu, PA-C   100 mg at 02/20/16 [id number]     Discharge Medications: Please see discharge summary for a list of discharge medications.  Relevant Imaging Results:  Relevant Lab Results:   Additional Information SSN: [id number]  Dulcy Fanny, LCSW

## 2016-02-20 NOTE — Progress Notes (Signed)
Pt. Placed on CPAP for h/s use with Medium nasal mask, oxygen added to circuit @ 3 lpm, wife @ bedside, humidity filled, tolerating well, RT to monitor.

## 2016-02-21 DIAGNOSIS — S2242XA Multiple fractures of ribs, left side, initial encounter for closed fracture: Secondary | ICD-10-CM | POA: Diagnosis not present

## 2016-02-21 DIAGNOSIS — R319 Hematuria, unspecified: Secondary | ICD-10-CM | POA: Diagnosis not present

## 2016-02-21 MED ORDER — FAMOTIDINE 20 MG PO TABS
20.0000 mg | ORAL_TABLET | Freq: Every day | ORAL | Status: DC
  Administered 2016-02-21 – 2016-02-24 (×4): 20 mg via ORAL
  Filled 2016-02-21 (×4): qty 1

## 2016-02-21 NOTE — Progress Notes (Signed)
Patients wife places patient on CPAP when ready. RT will assist if needed.

## 2016-02-21 NOTE — Consult Note (Signed)
[facility] CM Primary Care Navigator  02/21/2016  DARREON LUTES July 28, 1934 [id number]  Went to see patient at the bedside to identify possible discharge needs but he was fast asleep. Met his wife [name]) and stated that patient had a fall at church trying to find his trumpet/ stand. Wife endorses Dr. Lisette Grinder III with [facility] as his primary care provider.    Patient's wife shared using [facility] in [place] to obtain medications with no problem. With patient's dementia, wife manages patient's medications for him as stated.  Wife provides transportation to his doctors' appointments and she is the primary caregiver at home.   Since wife is not able enough to move patient, she prefers for patient to go to SNF (skilled nursing facility).  PT/OT recommendation is for patient to be discharged to SNF.   MD note states SNF when bed available for rehab, wife would prefer [place] area.  Wife will still have to meet with social worker to work on SNF as stated.   Patient's wife voiced understanding to call primary care provider's office once patient is discharged home, for a post discharge follow-up appointment within a week or sooner if needs arise. Patient letter provided as a reminder.  For additional questions please contact:  Edwena Felty A. Doneen Ollinger, [name], RN-BC [facility] PRIMARY CARE Navigator Cell: [phone]

## 2016-02-21 NOTE — Progress Notes (Addendum)
Nutrition Follow-up  DOCUMENTATION CODES:   Not applicable  INTERVENTION:   -Continue Ensure Enlive po BID, each supplement provides 350 kcal and 20 grams of protein  NUTRITION DIAGNOSIS:   Inadequate oral intake related to poor appetite as evidenced by per patient/family report.  Ongoing  GOAL:   Patient will meet greater than or equal to 90% of their needs  Progressing  MONITOR:   PO intake, Supplement acceptance, Labs, Weight trends  REASON FOR ASSESSMENT:   Malnutrition Screening Tool    ASSESSMENT:   81 year old male with HTN, CAD, hyperlipidemia, chronic sacral pain, some baseline dementia who was admitted on 10/14 after a fall at church. He is being treated for scalp laceration, left 4th through 9th rib fx with small pneumothorax, splenic laceration, and ABL anemia.   Pt transferred from SDU to surgical floor on 02/19/16.   Pt was sleeping soundly at time of visit. No family present at time of visit.   Meal intake remains variable; PO: 25-60%. Pt is accepting Ensure supplements, per MAR, however, noted unopened container of Ensure at bedside table. Pt also with multiple cups on tray table. Per MD notes, pt having difficulty swallowing at dinner yesterday, however, problem resolved this AM.  Noted wt reading on 98#. Suspect wt reading was entered in error, based upon wt hx and visualization of pt. Needs calculated based on previous wt reading of 168# recorded on 02/16/16.   CSW following; plan is to d/c to SNF in [place] area when medically stable.   Labs reviewed.   Diet Order:  Diet regular Room service appropriate? Yes; Fluid consistency: Thin  Skin:  Reviewed, no issues  Last BM:  02/15/16  Height:   Ht Readings from Last 1 Encounters:  02/19/16 5\' 10"  (1.778 m)    Weight:   Wt Readings from Last 1 Encounters:  02/19/16 98 lb (44.5 kg)    Ideal Body Weight:  70 kg  BMI:  Body mass index is 14.06 kg/m.  Estimated Nutritional Needs:    Kcal:  1800-2000  Protein:  85-100 gm  Fluid:  1.8-2 L  EDUCATION NEEDS:   No education needs identified at this time  Phu Record A. Jimmye Norman, RD, LDN, CDE Pager: [phone] After hours Pager: [phone]

## 2016-02-21 NOTE — Progress Notes (Signed)
Patient ID: [name], male   DOB: Oct 01, 1934, 81 y.o.   MRN: [id number]   LOS: 5 days   Subjective: Is having a problem with swallowing but only at dinner, feels like food get stuck.   Objective: Vital signs in last 24 hours: Temp:  [97.4 F (36.3 C)-98.5 F (36.9 C)] 98.3 F (36.8 C) (10/19 0630) Pulse Rate:  [60-108] 60 (10/19 0630) Resp:  [18] 18 (10/19 0630) BP: (91-119)/(51-67) 119/57 (10/19 0630) SpO2:  [99 %-100 %] 100 % (10/19 0630) Last BM Date: 02/15/16   Laboratory  CBC  Recent Labs  02/20/16 0500  WBC 9.5  HGB 10.6*  HCT 31.2*  PLT 115*   Urinalysis    Component Value Date/Time   COLORURINE YELLOW 02/20/2016 1831   APPEARANCEUR HAZY (A) 02/20/2016 1831   APPEARANCEUR Clear 09/02/2012 2151   LABSPEC 1.025 02/20/2016 1831   LABSPEC 1.018 09/02/2012 2151   PHURINE 5.5 02/20/2016 1831   GLUCOSEU NEGATIVE 02/20/2016 1831   GLUCOSEU Negative 09/02/2012 2151   HGBUR MODERATE (A) 02/20/2016 1831   BILIRUBINUR SMALL (A) 02/20/2016 1831   BILIRUBINUR Negative 09/02/2012 2151   [place] 02/20/2016 1831   PROTEINUR 30 (A) 02/20/2016 1831   NITRITE NEGATIVE 02/20/2016 1831   LEUKOCYTESUR TRACE (A) 02/20/2016 1831   LEUKOCYTESUR Negative 09/02/2012 2151    Physical Exam General appearance: alert and no distress Resp: rales RUL Cardio: regular rate and rhythm GI: normal findings: bowel sounds normal and soft, non-tender  Wound: Scalp lac WNL   Assessment/Plan: Fall Left scalp laceration -- Plan on D/C staples prior to discharge Left fourth through ninth rib fractures with small pneumothorax-- Pulmonary toilet Grade 1 splenic lac ABL anemia-- Mild, stable Multiple medical problems-- Home meds PAF -- Appreciate cardiology consult, now on metoprolol FEN-- Unsure what to make of hematuria, does not look infected. I suspect trauma related but will need to f/u on that as OP to make sure it resolves. Will try antacid prior to dinner to see  if that will help swallowing. VTE-- SCD's Dispo-- PT/OT, SNF when bed available, wife would prefer  area    Lisette Abu, PA-C Pager: [phone] General Trauma PA Pager: [phone]  02/21/2016

## 2016-02-21 NOTE — Progress Notes (Signed)
Occupational Therapy Treatment Patient Details Name: [name] MRN: [id number] DOB: Jun 05, 1934 Today's Date: 02/21/2016    History of present illness 81 y.o. male admitted to [facility] on 02/16/16 s/p fall with resultant L scalp laceration, L 4-9th rib fx with small PTX, grade 1 splenic lac, and ABL anemia.  Pt with significant PMhx of HTN, dementia, L shoulder RTC rupture, CAD, A-fib, and R shoulder surgery.     OT comments  Pt confusion limits participation at times and pt needs conversation to re-engage pt and then will perform further mobility/ADL.  Pt only oriented to self and "hospital", but did use calendar for month with cueing from OT.  Pt required increased time and min assist for seated ADL. Continue to feel SNF is most appropriate D/C venue for pt.  OT Will continue to follow.    Follow Up Recommendations  SNF;Supervision/Assistance - 24 hour    Equipment Recommendations  3 in 1 bedside comode;None recommended by OT    Recommendations for Other Services      Precautions / Restrictions Precautions Precautions: Fall Precaution Comments: h/o falls Restrictions Weight Bearing Restrictions: No       Mobility Bed Mobility Overal bed mobility: Needs Assistance;+2 for physical assistance Bed Mobility: Rolling;Sidelying to Sit Rolling: Mod assist Sidelying to sit: Mod assist;+2 for physical assistance;HOB elevated Supine to sit: +2 for physical assistance;Mod assist;HOB elevated Sit to supine: Max assist;+2 for physical assistance   General bed mobility comments: Rolled to R side and 2 person A for coming to sitting at EOB.  pt does attempt to A.    Transfers Overall transfer level: Needs assistance Equipment used: 2 person hand held assist Transfers: Sit to/from [facility] Sit to Stand: Mod assist;+2 physical assistance Stand pivot transfers: Mod assist;+2 physical assistance       General transfer comment: pt needs use of pad under hips for A with  coming to standing to minimize pressure from belt around waist.  pt tends to remain flexed and needs increased time for movement through pivot towards recliner on R.      Balance Overall balance assessment: Needs assistance Sitting-balance support: Bilateral upper extremity supported;Feet supported Sitting balance-Leahy Scale: Poor     Standing balance support: Bilateral upper extremity supported;During functional activity Standing balance-Leahy Scale: Poor                     ADL Overall ADL's : Needs assistance/impaired     Grooming: Wash/dry hands;Wash/dry face;Oral care;Minimal assistance;Sitting                   Toilet Transfer: Moderate assistance;+2 for physical assistance;Stand-pivot;BSC Toilet Transfer Details (indicate cue type and reason): simulated with recliner Toileting- Clothing Manipulation and Hygiene: Sit to/from stand;Moderate assistance (to use urinal sitting in recliner)       Functional mobility during ADLs: Moderate assistance;+2 for physical assistance General ADL Comments: Pt limited by pain and cognitive impairments       Vision                     Perception     Praxis      Cognition   Behavior During Therapy: Flat affect Overall Cognitive Status: No family/caregiver present to determine baseline cognitive functioning                       Extremity/Trunk Assessment  Exercises     Shoulder Instructions       General Comments      Pertinent Vitals/ Pain       Pain Assessment: Faces Faces Pain Scale: Hurts whole lot Pain Location: Left ribs during mobilty Pain Descriptors / Indicators: Grimacing;Guarding Pain Intervention(s): Monitored during session;Premedicated before session;Repositioned  Home Living                                          Prior Functioning/Environment              Frequency  Min 2X/week        Progress Toward Goals  OT  Goals(current goals can now be found in the care plan section)  Progress towards OT goals: Progressing toward goals  Acute Rehab OT Goals Patient Stated Goal: to have less pain  OT Goal Formulation: With patient Time For Goal Achievement: 03/04/16 Potential to Achieve Goals: Good  Plan Discharge plan remains appropriate    Co-evaluation    PT/OT/SLP Co-Evaluation/Treatment: Yes Reason for Co-Treatment: Necessary to address cognition/behavior during functional activity;For patient/therapist safety PT goals addressed during session: Mobility/safety with mobility OT goals addressed during session: ADL's and self-care      End of Session Equipment Utilized During Treatment: Gait belt;Oxygen   Activity Tolerance Patient limited by pain   Patient Left in chair;with call bell/phone within reach;with chair alarm set   Nurse Communication Mobility status        Time: [id number] OT Time Calculation (min): 48 min  Charges: OT General Charges $OT Visit: 1 Procedure OT Treatments $Self Care/Home Management : 23-37 mins  Jaci Carrel 02/21/2016, 5:00 PM Hulda Humphrey OTR/L [phone]

## 2016-02-21 NOTE — Progress Notes (Signed)
Physical Therapy Treatment Patient Details Name: [name] MRN: [id number] DOB: 1935-02-17 Today's Date: 02/21/2016    History of Present Illness 81 y.o. male admitted to [facility] on 02/16/16 s/p fall with resultant L scalp laceration, L 4-9th rib fx with small PTX, grade 1 splenic lac, and ABL anemia.  Pt with significant PMhx of HTN, dementia, L shoulder RTC rupture, CAD, A-fib, and R shoulder surgery.      PT Comments    Pt confusion limits participation at times and pt needs conversation to re-engage pt and then will perform further mobility.  Pt only oriented to self and "hospital", but did use calendar for month with cueing from OT.  Continue to feel SNF is most appropriate D/C venue for pt.  Will continue to follow.    Follow Up Recommendations  SNF     Equipment Recommendations  Rolling walker with 5" wheels;3in1 (PT)    Recommendations for Other Services       Precautions / Restrictions Precautions Precautions: Fall Precaution Comments: h/o falls Restrictions Weight Bearing Restrictions: No    Mobility  Bed Mobility Overal bed mobility: Needs Assistance;+2 for physical assistance Bed Mobility: Rolling;Sidelying to Sit Rolling: Mod assist Sidelying to sit: Mod assist;+2 for physical assistance;HOB elevated       General bed mobility comments: Rolled to R side and 2 person A for coming to sitting at EOB.  pt does attempt to A.    Transfers Overall transfer level: Needs assistance Equipment used: 2 person hand held assist Transfers: Sit to/from [facility] Sit to Stand: Mod assist;+2 physical assistance Stand pivot transfers: Mod assist;+2 physical assistance       General transfer comment: pt needs use of pad under hips for A with coming to standing to minimize pressure from belt around waist.  pt tends to remain flexed and needs increased time for movement through pivot towards recliner on R.    Ambulation/Gait                  Stairs            Wheelchair Mobility    Modified Rankin (Stroke Patients Only)       Balance Overall balance assessment: Needs assistance;History of Falls Sitting-balance support: Bilateral upper extremity supported;Feet supported Sitting balance-Leahy Scale: Poor     Standing balance support: Bilateral upper extremity supported;During functional activity Standing balance-Leahy Scale: Poor                      Cognition Arousal/Alertness: Awake/alert Behavior During Therapy: Flat affect Overall Cognitive Status: No family/caregiver present to determine baseline cognitive functioning                      Exercises      General Comments        Pertinent Vitals/Pain Pain Assessment: Faces Faces Pain Scale: Hurts whole lot Pain Location: L ribs during mobility Pain Descriptors / Indicators: Grimacing;Guarding Pain Intervention(s): Monitored during session;Premedicated before session;Repositioned    Home Living                      Prior Function            PT Goals (current goals can now be found in the care plan section) Acute Rehab PT Goals Patient Stated Goal: to have less pain  PT Goal Formulation: Patient unable to participate in goal setting Time For Goal Achievement: 03/04/16 Potential to Achieve  Goals: Good Progress towards PT goals: Progressing toward goals    Frequency    Min 3X/week      PT Plan Current plan remains appropriate    Co-evaluation PT/OT/SLP Co-Evaluation/Treatment: Yes Reason for Co-Treatment: For patient/therapist safety;Necessary to address cognition/behavior during functional activity PT goals addressed during session: Mobility/safety with mobility;Balance       End of Session Equipment Utilized During Treatment: Gait belt;Oxygen Activity Tolerance: Patient limited by pain Patient left: in chair;with call bell/phone within reach;with chair alarm set     Time: [id number] PT Time  Calculation (min) (ACUTE ONLY): 32 min  Charges:  $Therapeutic Activity: 8-22 mins                    G CodesCatarina Hartshorn, [place] 02/21/2016, 2:37 PM

## 2016-02-22 DIAGNOSIS — R319 Hematuria, unspecified: Secondary | ICD-10-CM | POA: Diagnosis not present

## 2016-02-22 DIAGNOSIS — S2242XA Multiple fractures of ribs, left side, initial encounter for closed fracture: Secondary | ICD-10-CM | POA: Diagnosis not present

## 2016-02-22 NOTE — Progress Notes (Signed)
Patient wife said she will place her husband on CPAP when he is ready. She would call though if any assistance needed.

## 2016-02-22 NOTE — Progress Notes (Signed)
Per MD, pt will be medically ready for discharge to SNF on Saturday, 02/23/16.  CSW notified; she states she will give wife bed offers and get insurance auth in preparation for Saturday discharge.    Reinaldo Raddle, RN, [name]  Trauma/Neuro ICU Case Manager [phone]

## 2016-02-22 NOTE — Progress Notes (Signed)
  Subjective: No further swallowing issues, some blood tinged sputum  Objective: Vital signs in last 24 hours: Temp:  [98.8 F (37.1 C)-99.5 F (37.5 C)] 99.5 F (37.5 C) (10/20 0535) Pulse Rate:  [67-74] 74 (10/20 0535) Resp:  [18] 18 (10/20 0535) BP: (120-140)/(58-67) 140/67 (10/20 0535) SpO2:  [92 %-97 %] 92 % (10/20 0535) Last BM Date:  (Not yet)  Intake/Output from previous day: 10/19 0701 - 10/20 0700 In: 909.7 [I.V.:909.7] Out: 200 [Urine:200] Intake/Output this shift: No intake/output data recorded.  General appearance: cooperative Resp: clear to auscultation bilaterally Cardio: regular rate and rhythm GI: soft, NT  Lab Results: CBC   Recent Labs  02/20/16 0500  WBC 9.5  HGB 10.6*  HCT 31.2*  PLT 115*   BMET No results for input(s): NA, K, CL, CO2, GLUCOSE, BUN, CREATININE, CALCIUM in the last 72 hours. PT/INR No results for input(s): LABPROT, INR in the last 72 hours. ABG No results for input(s): PHART, HCO3 in the last 72 hours.  Invalid input(s): PCO2, PO2  Studies/Results: No results found.  Anti-infectives: Anti-infectives    None      Assessment/Plan: Fall Left scalp laceration -- Plan on D/C staples prior to discharge Left fourth through ninth rib fractures with small pneumothorax-- Pulmonary toilet Grade 1 splenic lac ABL anemia-- Mild, stable Multiple medical problems-- Home meds PAF -- Appreciate cardiology consult, now on metoprolol FEN-- hematuria is better VTE-- SCD's Dispo-- PT/OT, SNF   LOS: 6 days    Georganna Skeans, MD, MPH, FACS Trauma: [phone] General Surgery: [phone]  10/20/2017Patient ID: [name], male   DOB: [birth year], 81 y.o.   MRN: [id number]

## 2016-02-23 ENCOUNTER — Inpatient Hospital Stay (HOSPITAL_COMMUNITY): Payer: PPO

## 2016-02-23 DIAGNOSIS — S2242XA Multiple fractures of ribs, left side, initial encounter for closed fracture: Secondary | ICD-10-CM | POA: Diagnosis not present

## 2016-02-23 LAB — CBC
HCT: 31.2 % — ABNORMAL LOW (ref 39.0–52.0)
HEMOGLOBIN: 10.7 g/dL — AB (ref 13.0–17.0)
MCH: 32 pg (ref 26.0–34.0)
MCHC: 34.3 g/dL (ref 30.0–36.0)
MCV: 93.4 fL (ref 78.0–100.0)
PLATELETS: 184 10*3/uL (ref 150–400)
RBC: 3.34 MIL/uL — AB (ref 4.22–5.81)
RDW: 13.1 % (ref 11.5–15.5)
WBC: 6.2 10*3/uL (ref 4.0–10.5)

## 2016-02-23 NOTE — Progress Notes (Signed)
  Subjective: Wife reports confusion is slightly worse He reports left sided chest pain but denies SOB  Objective: Vital signs in last 24 hours: Temp:  [97.3 F (36.3 C)-99.2 F (37.3 C)] 97.3 F (36.3 C) (10/21 0637) Pulse Rate:  [62-74] 62 (10/21 0637) Resp:  [18] 18 (10/21 0637) BP: (130-148)/(63-72) 130/63 (10/21 0637) SpO2:  [98 %-100 %] 100 % (10/21 0637) Last BM Date: 02/22/16  Intake/Output from previous day: 10/20 0701 - 10/21 0700 In: -  Out: 200 [Urine:200] Intake/Output this shift: No intake/output data recorded.  Exam: Awake and answers questions Lungs with crackles bilaterally, left >right Abdomen soft  Lab Results:  No results for input(s): WBC, HGB, HCT, PLT in the last 72 hours. BMET No results for input(s): NA, K, CL, CO2, GLUCOSE, BUN, CREATININE, CALCIUM in the last 72 hours. PT/INR No results for input(s): LABPROT, INR in the last 72 hours. ABG No results for input(s): PHART, HCO3 in the last 72 hours.  Invalid input(s): PCO2, PO2  Studies/Results: No results found.  Anti-infectives: Anti-infectives    None      Assessment/Plan: S/p fall with scalp lac, rib fractures, spleen lac  Not ready for SNF today Will need to repeat CXR given exam Continue PT/OT Pulmonary toilet  LOS: 7 days    Vick Filter A 02/23/2016

## 2016-02-23 NOTE — Clinical Social Work Note (Signed)
Clinical Social Work Assessment  Patient Details  Name: [name] MRN: [id number] Date of Birth: [birth year]  Date of referral:  02/23/16               Reason for consult:  Discharge Planning                Permission sought to share information with:  Case Manager, Facility Sport and exercise psychologist, Family Supports Permission granted to share information::  Yes, Verbal Permission Granted  Name::        Agency::   (SNF)  Relationship::     Contact Information:     Housing/Transportation Living arrangements for the past 2 months:  Single Family Home Source of Information:  Medical Team, Spouse Patient Interpreter Needed:  None Criminal Activity/Legal Involvement Pertinent to Current Situation/Hospitalization:  No - Comment as needed Significant Relationships:  Spouse Lives with:  Spouse Do you feel safe going back to the place where you live?  No Need for family participation in patient care:  Yes (Comment)  Care giving concerns: Pt confused and disoriented. CSW contacted pt wife via phone.    Social Worker assessment / plan:  Holiday representative spoke with pt's wife via phone and discussed CSW role with discharge planning. Pt wife also informed of PT recommendation for SNF, for higher level of care. Pt wife stated that she can't care for pt at home, and is agreeable to SNF for short-term rehab.   CSW will follow up with pt family to assist with discharge from the hospital.   Employment status:  Retired Insurance underwriter information:  Other (Comment Required) Agricultural engineer) PT Recommendations:  [place] / Referral to community resources:  [place]  Patient/Family's Response to care: Pt confused and disoriented. Pt wife appears to be happy with care pt is receiving at [facility].   Patient/Family's Understanding of and Emotional Response to Diagnosis, Current Treatment, and Prognosis: Pt's wife appears to have a good understanding  of reason for pt admission into the hospital and with pt care plan.   Emotional Assessment Appearance:   (Unable to assess) Attitude/Demeanor/Rapport:  Unable to Assess Affect (typically observed):  Unable to Assess Orientation:  Oriented to Self Alcohol / Substance use:  Not Applicable Psych involvement (Current and /or in the community):  No (Comment)  Discharge Needs  Concerns to be addressed:  Discharge Planning Concerns Readmission within the last 30 days:  No Current discharge risk:  None Barriers to Discharge:  Continued Medical Work up   [employer], LCSW 02/23/2016, 1:54 PM

## 2016-02-23 NOTE — Clinical Social Work Note (Signed)
CSW spoke with pt wife and gave bed offers. Pt wife chose [facility] in [place], [place]. Pt wife also gave cell number [phone] to contact when pt discharges from hospital.

## 2016-02-24 DIAGNOSIS — S2242XA Multiple fractures of ribs, left side, initial encounter for closed fracture: Secondary | ICD-10-CM | POA: Diagnosis not present

## 2016-02-24 MED ORDER — HYDROCODONE-ACETAMINOPHEN 5-325 MG PO TABS
1.0000 | ORAL_TABLET | Freq: Four times a day (QID) | ORAL | Status: DC | PRN
  Administered 2016-02-24: 1 via ORAL
  Filled 2016-02-24: qty 1

## 2016-02-24 NOTE — Progress Notes (Signed)
Patients wife places him on and off CPAP. Patient currently not wearing.

## 2016-02-24 NOTE — Progress Notes (Signed)
  Subjective: Reports minimal SOB this morning Confusion worse at nights  Objective: Vital signs in last 24 hours: Temp:  [97.8 F (36.6 C)-99 F (37.2 C)] 98.5 F (36.9 C) (10/22 0644) Pulse Rate:  [63-72] 63 (10/22 0644) Resp:  [16-18] 16 (10/22 0644) BP: (138-177)/(59-80) 177/80 (10/22 0644) SpO2:  [95 %-99 %] 99 % (10/22 0644) Last BM Date: 02/23/16  Intake/Output from previous day: 10/21 0701 - 10/22 0700 In: 460 [P.O.:460] Out: 800 [Urine:800] Intake/Output this shift: No intake/output data recorded.  Exam: Looks comfortable Lungs with normal resp rate, decrease BS on left  Lab Results:   Recent Labs  02/23/16 1019  WBC 6.2  HGB 10.7*  HCT 31.2*  PLT 184   BMET No results for input(s): NA, K, CL, CO2, GLUCOSE, BUN, CREATININE, CALCIUM in the last 72 hours. PT/INR No results for input(s): LABPROT, INR in the last 72 hours. ABG No results for input(s): PHART, HCO3 in the last 72 hours.  Invalid input(s): PCO2, PO2  Studies/Results: Dg Chest Port 1 View  Result Date: 02/23/2016 CLINICAL DATA:  Fall 1 week ago with known rib fractures, initial encounter EXAM: PORTABLE CHEST 1 VIEW COMPARISON:  02/19/2016 FINDINGS: Cardiac shadow is stable. Slight enlargement mild left-sided pleural effusion when compare with the prior exam. Multiple calcified pleural plaques are noted. Left basilar atelectasis/consolidation is noted. Tiny right pleural effusion is stable. Multiple left rib fractures are again identified to include the fourth, fifth, seventh and eighth ribs. These are stable in appearance. No pneumothorax is noted. IMPRESSION: Increasing left-sided pleural effusion. Multiple left rib fractures. Electronically Signed   By: Inez Catalina M.D.   On: 02/23/2016 10:14    Anti-infectives: Anti-infectives    None      Assessment/Plan: S/p fall with multiple left sided rib fractures  CXR yesterday with increase in the left sided pleural effusion. Will need to  keep here and repeat an xray again in the morning. If effusion worse, may need a CT scan and paracentesis Continue pulmonary toilet  LOS: 8 days    Erilyn Pearman A 02/24/2016

## 2016-02-25 ENCOUNTER — Encounter (HOSPITAL_COMMUNITY): Payer: Self-pay

## 2016-02-25 ENCOUNTER — Inpatient Hospital Stay (HOSPITAL_COMMUNITY): Payer: PPO

## 2016-02-25 DIAGNOSIS — E785 Hyperlipidemia, unspecified: Secondary | ICD-10-CM | POA: Diagnosis not present

## 2016-02-25 DIAGNOSIS — K458 Other specified abdominal hernia without obstruction or gangrene: Secondary | ICD-10-CM | POA: Diagnosis not present

## 2016-02-25 DIAGNOSIS — I4891 Unspecified atrial fibrillation: Secondary | ICD-10-CM | POA: Diagnosis not present

## 2016-02-25 DIAGNOSIS — F0390 Unspecified dementia without behavioral disturbance: Secondary | ICD-10-CM | POA: Diagnosis not present

## 2016-02-25 DIAGNOSIS — S36030D Superficial (capsular) laceration of spleen, subsequent encounter: Secondary | ICD-10-CM | POA: Diagnosis not present

## 2016-02-25 DIAGNOSIS — S0191XA Laceration without foreign body of unspecified part of head, initial encounter: Secondary | ICD-10-CM | POA: Diagnosis not present

## 2016-02-25 DIAGNOSIS — G4730 Sleep apnea, unspecified: Secondary | ICD-10-CM | POA: Diagnosis not present

## 2016-02-25 DIAGNOSIS — R52 Pain, unspecified: Secondary | ICD-10-CM | POA: Diagnosis not present

## 2016-02-25 DIAGNOSIS — S0101XD Laceration without foreign body of scalp, subsequent encounter: Secondary | ICD-10-CM | POA: Diagnosis not present

## 2016-02-25 DIAGNOSIS — Z7982 Long term (current) use of aspirin: Secondary | ICD-10-CM | POA: Diagnosis not present

## 2016-02-25 DIAGNOSIS — G9340 Encephalopathy, unspecified: Secondary | ICD-10-CM | POA: Diagnosis not present

## 2016-02-25 DIAGNOSIS — F329 Major depressive disorder, single episode, unspecified: Secondary | ICD-10-CM | POA: Diagnosis not present

## 2016-02-25 DIAGNOSIS — I2510 Atherosclerotic heart disease of native coronary artery without angina pectoris: Secondary | ICD-10-CM | POA: Diagnosis not present

## 2016-02-25 DIAGNOSIS — J90 Pleural effusion, not elsewhere classified: Secondary | ICD-10-CM | POA: Diagnosis not present

## 2016-02-25 DIAGNOSIS — S2242XD Multiple fractures of ribs, left side, subsequent encounter for fracture with routine healing: Secondary | ICD-10-CM | POA: Diagnosis not present

## 2016-02-25 DIAGNOSIS — S2242XA Multiple fractures of ribs, left side, initial encounter for closed fracture: Secondary | ICD-10-CM | POA: Diagnosis not present

## 2016-02-25 DIAGNOSIS — R259 Unspecified abnormal involuntary movements: Secondary | ICD-10-CM | POA: Diagnosis not present

## 2016-02-25 DIAGNOSIS — W19XXXD Unspecified fall, subsequent encounter: Secondary | ICD-10-CM | POA: Diagnosis not present

## 2016-02-25 DIAGNOSIS — F419 Anxiety disorder, unspecified: Secondary | ICD-10-CM | POA: Diagnosis not present

## 2016-02-25 DIAGNOSIS — K219 Gastro-esophageal reflux disease without esophagitis: Secondary | ICD-10-CM | POA: Diagnosis not present

## 2016-02-25 DIAGNOSIS — I10 Essential (primary) hypertension: Secondary | ICD-10-CM | POA: Diagnosis not present

## 2016-02-25 DIAGNOSIS — R339 Retention of urine, unspecified: Secondary | ICD-10-CM | POA: Diagnosis not present

## 2016-02-25 DIAGNOSIS — D649 Anemia, unspecified: Secondary | ICD-10-CM | POA: Diagnosis not present

## 2016-02-25 MED ORDER — METOPROLOL TARTRATE 25 MG PO TABS: 25.0000 mg | ORAL_TABLET | Freq: Two times a day (BID) | ORAL | Status: DC

## 2016-02-25 MED ORDER — FAMOTIDINE 20 MG PO TABS: 20.0000 mg | ORAL_TABLET | Freq: Every day | ORAL | Status: DC

## 2016-02-25 MED ORDER — HYDROCODONE-ACETAMINOPHEN 5-325 MG PO TABS: 1.0000 | ORAL_TABLET | Freq: Four times a day (QID) | ORAL | 0 refills | Status: DC | PRN

## 2016-02-25 NOTE — Clinical Social Work Note (Signed)
CSW facilitated patient discharge including contacting patient family and facility to confirm patient discharge plans. Clinical information faxed to facility and family agreeable with plan. CSW arranged ambulance transport via [facility] to [facility]. RN to call report prior to discharge [phone]).  CSW will sign off for now as social work intervention is no longer needed. Please consult [place] again if new needs arise.  Dayton Scrape, [place]

## 2016-02-25 NOTE — Clinical Social Work Note (Addendum)
Discharge summary sent to [facility]. CSW called and left voicemail for admissions coordinator, Lawerance Sabal. Awaiting call back before starting insurance authorization.  Dayton Scrape, [place] [phone]  11:44 am [facility] was unaware that patient had accepted their bed offer and therefore do not have a bed available today. They will have one tomorrow. CSW discussed with patient and his wife. [facility] and [facility] referrals still pending. Patient's wife prefers [facility] as her sister has been there before. Patient and his wife made aware that insurance authorization can take up to 4 hours once started. CSW will call [facility] to check on referral.  Dayton Scrape, [place]  12:09 pm [facility] has extended a bed offer. CSW called and left voicemail with Oletta Darter at [facility] to start insurance authorization.   Dayton Scrape, [place] [phone]  12:35 pm Insurance authorization has been started.  Dayton Scrape, [place] [phone]  2:17 pm Insurance authorization obtained: [id number]. Given to [place] at [facility]. Patient can transport when ready.  Dayton Scrape, [place]

## 2016-02-25 NOTE — Progress Notes (Signed)
Patient ID: MOAAZ BONCZEK, male   DOB: 1934/08/14, 81 y.o.   MRN: [id number]   LOS: 9 days   Subjective: Had better night last night with different pain meds   Objective: Vital signs in last 24 hours: Temp:  [98.3 F (36.8 C)-98.5 F (36.9 C)] 98.3 F (36.8 C) (10/23 [id number]) Pulse Rate:  [62-68] 68 (10/23 0637) Resp:  [16] 16 (10/23 [id number]) BP: (142-162)/(56-72) 162/72 (10/23 [id number]) SpO2:  [100 %] 100 % (10/23 [id number]) Last BM Date: 02/23/16   Radiology Results CXR: Right effusion minimally improved (official read pending)   Physical Exam General appearance: alert and no distress Resp: clear to auscultation bilaterally Cardio: regular rate and rhythm GI: normal findings: bowel sounds normal and soft, non-tender   Assessment/Plan: Fall Left scalp laceration -- Plan on D/C staples prior to discharge Left fourth through ninth rib fractures with small pneumothorax-- Pulmonary toilet Grade 1 splenic lac ABL anemia-- Mild, stable Multiple medical problems-- Home meds PAF -- Appreciate cardiology consult, now on metoprolol Dispo-- D/C to SNF    Lisette Abu, PA-C Pager: [phone] General Trauma PA Pager: [phone]  02/25/2016

## 2016-02-25 NOTE — Clinical Social Work Placement (Signed)
   CLINICAL SOCIAL WORK PLACEMENT  NOTE  Date:  02/25/2016  Patient Details  Name: [name] MRN: [id number] Date of Birth: 12/19/1934  Clinical Social Work is seeking post-discharge placement for this patient at the [place] level of care (*CSW will initial, date and re-position this form in  chart as items are completed):  Yes   Patient/family provided with [place] Work Department's list of facilities offering this level of care within the geographic area requested by the patient (or if unable, by the patient's family).  Yes   Patient/family informed of their freedom to choose among providers that offer the needed level of care, that participate in Medicare, Medicaid or managed care program needed by the patient, have an available bed and are willing to accept the patient.  Yes   Patient/family informed of [facility]'s ownership interest in [facility] and [facility], as well as of the fact that they are under no obligation to receive care at these facilities.  PASRR submitted to EDS on 02/20/16     PASRR number received on 02/20/16     Existing PASRR number confirmed on       FL2 transmitted to all facilities in geographic area requested by pt/family on 02/20/16     FL2 transmitted to all facilities within larger geographic area on       Patient informed that his/her managed care company has contracts with or will negotiate with certain facilities, including the following:        Yes   Patient/family informed of bed offers received.  Patient chooses bed at [facility]     Physician recommends and patient chooses bed at      Patient to be transferred to [facility] on 02/25/16.  Patient to be transferred to facility by [facility]     Patient family notified on 02/25/16 of transfer.  Name of family member notified:  [name]     PHYSICIAN Please prepare prescriptions     Additional Comment:     _______________________________________________ Candie Chroman, LCSW 02/25/2016, 2:21 PM

## 2016-02-25 NOTE — Discharge Summary (Signed)
Physician Discharge Summary  Patient ID: [name] MRN: [id number] DOB/AGE: Aug 09, 1934 81 y.o.  Admit date: 02/16/2016 Discharge date: 02/25/2016  Discharge Diagnoses Patient Active Problem List   Diagnosis Date Noted  . Fall 02/18/2016  . Scalp laceration 02/18/2016  . Splenic laceration 02/18/2016  . Acute blood loss anemia 02/18/2016  . Multiple rib fractures 02/16/2016  . Mild dementia 04/10/2015  . Aggrieved 03/02/2015  . Absolute anemia 09/11/2014  . Carotid artery disease ([place]) 09/11/2014  . Dementia 09/11/2014  . History of repair of rotator cuff 03/02/2014  . Atrial fibrillation with RVR ([place]) 01/23/2014  . Personal history of other diseases of the circulatory system 01/23/2014  . HLD (hyperlipidemia) 01/23/2014  . Complete rotator cuff rupture of left shoulder 12/30/2013  . BP (high blood pressure) 11/07/2013  . Amnesia 11/07/2013  . Obstructive apnea 11/07/2013    Consultants Dr. Lauree Chandler for cardiology   Procedures 10/14 -- Repair of scalp laceration by Dr. Shirlyn Goltz   HPI: [name] was at church trying to collect his trumpet when he fell. He did not remember the event. He had a scalp laceration. He complained of some chest wall tenderness. He was taken to [facility] emergency room. CT of the head, C-spine, chest and pelvis was performed. He was found to have left rib fractures 4 through 9 and 10th. He was also found to have some blood in his abdomen, probably from a minor spleen laceration. There was no evidence of active extravasation. Based on the numerous rib fractures the trauma service was contacted for transfer and admission.   Hospital Course: The patient did not suffer any respiratory compromise from his rib fractures. He developed a mild acute blood loss anemia that quickly stabilized and did not require transfusion. His main issue was with confusion and agitation that were worse at night. He was mobilized with physical and occupational  therapies who recommended skilled nursing facility placement. He developed atrial fibrillation with rapid ventricular response toward the end of his planned hospital stay and cardiology was consulted. He was placed on oral rate control and responded well to this. He experienced some gross hematuria late in his course that remained unexplained; he will need outpatient follow-up to make sure this resolved. Chest x-rays toward the end of his stay showed a minimal right effusion and were stable. He was discharged to the skilled nursing facility in good condition.     Medication List    TAKE these medications   amLODipine 5 MG tablet Commonly known as:  NORVASC Take 5 mg by mouth daily.   aspirin EC 81 MG tablet Take 81 mg by mouth every other day.   b complex vitamins tablet Take 1 tablet by mouth daily.   cyanocobalamin 500 MCG tablet Take 500 mcg by mouth daily.   donepezil 10 MG tablet Commonly known as:  ARICEPT Take 10 mg by mouth at bedtime.   famotidine 20 MG tablet Commonly known as:  PEPCID Take 1 tablet (20 mg total) by mouth daily.   Fish Oil 1000 MG Caps Take 1 capsule by mouth every other day.   FLAX SEED OIL PO Take 1 capsule by mouth daily.   HYDROcodone-acetaminophen 5-325 MG tablet Commonly known as:  NORCO/VICODIN Take 1 tablet by mouth every 6 (six) hours as needed for moderate pain.   memantine 5 MG tablet Commonly known as:  NAMENDA Take 5 mg by mouth 2 (two) times daily.   metoprolol tartrate 25 MG tablet Commonly known as:  LOPRESSOR Take 1 tablet (25 mg total) by mouth 2 (two) times daily.   MULTI-VITAMINS Tabs Take 1 tablet by mouth daily.   tamsulosin 0.4 MG Caps capsule Commonly known as:  FLOMAX TAKE ONE CAPSULE BY MOUTH ONCE DAILY   vitamin C 1000 MG tablet Take by mouth.       Follow-up Information    [facility] .   Why:  Call as needed Contact information: [id number] [place] [phone]           Signed: Lisette Abu, PA-C Pager: [phone] General Trauma PA Pager: [phone] 02/25/2016, 8:09 AM

## 2016-02-25 NOTE — Progress Notes (Signed)
Report called to [facility]. Iv removed and staples removed.  Patient ready for transport.

## 2016-02-25 NOTE — Progress Notes (Signed)
Patients wife places him on CPAP. Unit setup and water added and she will place on when he is ready

## 2016-02-25 NOTE — Progress Notes (Signed)
Physical Therapy Treatment Patient Details Name: [name] MRN: [id number] DOB: [birth year] Today's Date: 02/25/2016    History of Present Illness 81 y.o. male admitted to [facility] on 02/16/16 s/p fall with resultant L scalp laceration, L 4-9th rib fx with small PTX, grade 1 splenic lac, and ABL anemia.  Pt with significant PMhx of HTN, dementia, L shoulder RTC rupture, CAD, A-fib, and R shoulder surgery.      PT Comments    Pt able to ambulate in hallway today with RW and chair follow.  He has short shuffeled steps with cues for posture.  Con't to recommend SNF for rehab.  Follow Up Recommendations  SNF     Equipment Recommendations  Rolling walker with 5" wheels;3in1 (PT)    Recommendations for Other Services       Precautions / Restrictions Precautions Precautions: Fall Restrictions Weight Bearing Restrictions: No    Mobility  Bed Mobility               General bed mobility comments: Pt up in recliner upon arrival.  Transfers Overall transfer level: Needs assistance Equipment used: Rolling walker (2 wheeled) Transfers: Sit to/from Stand Sit to Stand: Min assist;+2 physical assistance         General transfer comment: Does best with bed pad under hips to A with transfer due to rib fxs.  Ambulation/Gait Ambulation/Gait assistance: Min assist Ambulation Distance (Feet): 50 Feet Assistive device: Rolling walker (2 wheeled) Gait Pattern/deviations: Decreased step length - right;Decreased step length - left;Shuffle;Trunk flexed Gait velocity: decreased Gait velocity interpretation: Below normal speed for age/gender General Gait Details: Short shuffeled gait pattern with cues for posture. He was able to stand taller, but grimacing and then would flex again.   Stairs            Wheelchair Mobility    Modified Rankin (Stroke Patients Only)       Balance           Standing balance support: Bilateral upper extremity supported Standing  balance-Leahy Scale: Poor                      Cognition Arousal/Alertness: Awake/alert Behavior During Therapy: Flat affect Overall Cognitive Status: Within Functional Limits for tasks assessed                      Exercises      General Comments        Pertinent Vitals/Pain Pain Assessment: Faces Faces Pain Scale: Hurts even more Pain Location: ribs with transfers Pain Descriptors / Indicators: Grimacing Pain Intervention(s): Limited activity within patient's tolerance;Monitored during session;Repositioned    Home Living                      Prior Function            PT Goals (current goals can now be found in the care plan section) Acute Rehab PT Goals PT Goal Formulation: With family Time For Goal Achievement: 03/04/16 Potential to Achieve Goals: Good Progress towards PT goals: Progressing toward goals    Frequency    Min 3X/week      PT Plan Current plan remains appropriate    Co-evaluation             End of Session Equipment Utilized During Treatment: Gait belt Activity Tolerance: Patient tolerated treatment well Patient left: in chair;with call bell/phone within reach;with family/visitor present;with nursing/sitter in room  Time: [id number] PT Time Calculation (min) (ACUTE ONLY): 17 min  Charges:  $Gait Training: 8-22 mins                    G Codes:      [name] 02/25/2016, 11:46 AM

## 2016-02-25 NOTE — Consult Note (Signed)
   [facility] CM Inpatient Consult   02/25/2016  ALICK FOLKS 09/10/1934 [id number]     Patient screened for potential [facility] Care Management services. Chart reviewed. Noted current discharge plan is for SNF.  There are no identifiable [facility] Care Management needs at this time. If patient's post hospital needs change, please place a [facility] Care Management consult. For questions please contact:  Marthenia Rolling, [place], RN,[name] [facility] Liaison [phone]

## 2016-02-27 DIAGNOSIS — G9340 Encephalopathy, unspecified: Secondary | ICD-10-CM | POA: Diagnosis not present

## 2016-02-27 DIAGNOSIS — I4891 Unspecified atrial fibrillation: Secondary | ICD-10-CM | POA: Diagnosis not present

## 2016-02-27 DIAGNOSIS — F0390 Unspecified dementia without behavioral disturbance: Secondary | ICD-10-CM | POA: Diagnosis not present

## 2016-02-27 DIAGNOSIS — R52 Pain, unspecified: Secondary | ICD-10-CM | POA: Diagnosis not present

## 2016-03-05 DIAGNOSIS — I10 Essential (primary) hypertension: Secondary | ICD-10-CM | POA: Diagnosis not present

## 2016-03-05 DIAGNOSIS — S36030D Superficial (capsular) laceration of spleen, subsequent encounter: Secondary | ICD-10-CM | POA: Diagnosis not present

## 2016-03-05 DIAGNOSIS — G4730 Sleep apnea, unspecified: Secondary | ICD-10-CM | POA: Diagnosis not present

## 2016-03-05 DIAGNOSIS — F0390 Unspecified dementia without behavioral disturbance: Secondary | ICD-10-CM | POA: Diagnosis not present

## 2016-03-05 DIAGNOSIS — R339 Retention of urine, unspecified: Secondary | ICD-10-CM | POA: Diagnosis not present

## 2016-03-05 DIAGNOSIS — I2510 Atherosclerotic heart disease of native coronary artery without angina pectoris: Secondary | ICD-10-CM | POA: Diagnosis not present

## 2016-03-05 DIAGNOSIS — S2242XD Multiple fractures of ribs, left side, subsequent encounter for fracture with routine healing: Secondary | ICD-10-CM | POA: Diagnosis not present

## 2016-03-05 DIAGNOSIS — E329 Disease of thymus, unspecified: Secondary | ICD-10-CM | POA: Diagnosis not present

## 2016-03-05 DIAGNOSIS — I4891 Unspecified atrial fibrillation: Secondary | ICD-10-CM | POA: Diagnosis not present

## 2016-03-05 DIAGNOSIS — K458 Other specified abdominal hernia without obstruction or gangrene: Secondary | ICD-10-CM | POA: Diagnosis not present

## 2016-03-05 DIAGNOSIS — W19XXXD Unspecified fall, subsequent encounter: Secondary | ICD-10-CM | POA: Diagnosis not present

## 2016-03-05 DIAGNOSIS — Z7982 Long term (current) use of aspirin: Secondary | ICD-10-CM | POA: Diagnosis not present

## 2016-03-05 DIAGNOSIS — F419 Anxiety disorder, unspecified: Secondary | ICD-10-CM | POA: Diagnosis not present

## 2016-03-05 DIAGNOSIS — K219 Gastro-esophageal reflux disease without esophagitis: Secondary | ICD-10-CM | POA: Diagnosis not present

## 2016-03-05 DIAGNOSIS — D649 Anemia, unspecified: Secondary | ICD-10-CM | POA: Diagnosis not present

## 2016-03-05 DIAGNOSIS — S0101XD Laceration without foreign body of scalp, subsequent encounter: Secondary | ICD-10-CM | POA: Diagnosis not present

## 2016-03-05 DIAGNOSIS — E785 Hyperlipidemia, unspecified: Secondary | ICD-10-CM | POA: Diagnosis not present

## 2016-03-13 ENCOUNTER — Other Ambulatory Visit: Payer: Self-pay | Admitting: *Deleted

## 2016-03-13 NOTE — Patient Outreach (Signed)
Call to patient wife, Carmin Malen after care coordination discussion with SNF SW, Vickki Hearing.  Spoke with wife regarding patient situation and needs. Pt will be discharged home tomorrow.  Wife is primary caregiver, she reports that they do not have much support from family or friends. They could not afford the Home care co-pay at this time. She states patient has mild dementia, she is providing care and transportation, wife teared up while discussing their situation. RNCM discussed [facility] care management program and that it is no cost and encouraged patient to consider our services for patient. Wife states she is overwhelmed and does not want to make a decision at this time. RNCM gave wife contact information, encouraged patient wife to call RNCM or [facility] office for further information.    Plan: RNCM will sign off, but be available for future needs. Royetta Crochet. Laymond Purser, RN, [name], [facility] Acute Care Coordinator [phone]

## 2016-03-14 DIAGNOSIS — I10 Essential (primary) hypertension: Secondary | ICD-10-CM | POA: Diagnosis not present

## 2016-03-17 DIAGNOSIS — I10 Essential (primary) hypertension: Secondary | ICD-10-CM | POA: Diagnosis not present

## 2016-03-17 DIAGNOSIS — F0390 Unspecified dementia without behavioral disturbance: Secondary | ICD-10-CM | POA: Diagnosis not present

## 2016-03-17 DIAGNOSIS — G4733 Obstructive sleep apnea (adult) (pediatric): Secondary | ICD-10-CM | POA: Diagnosis not present

## 2016-03-17 DIAGNOSIS — I480 Paroxysmal atrial fibrillation: Secondary | ICD-10-CM | POA: Diagnosis not present

## 2016-04-09 ENCOUNTER — Other Ambulatory Visit: Payer: Self-pay | Admitting: Physician Assistant

## 2016-04-09 DIAGNOSIS — M5432 Sciatica, left side: Principal | ICD-10-CM

## 2016-04-09 DIAGNOSIS — M5431 Sciatica, right side: Secondary | ICD-10-CM

## 2016-04-14 DIAGNOSIS — Z8659 Personal history of other mental and behavioral disorders: Secondary | ICD-10-CM | POA: Diagnosis not present

## 2016-04-14 DIAGNOSIS — F0150 Vascular dementia without behavioral disturbance: Secondary | ICD-10-CM | POA: Diagnosis not present

## 2016-04-14 DIAGNOSIS — G4733 Obstructive sleep apnea (adult) (pediatric): Secondary | ICD-10-CM | POA: Diagnosis not present

## 2016-04-14 DIAGNOSIS — G309 Alzheimer's disease, unspecified: Secondary | ICD-10-CM | POA: Diagnosis not present

## 2016-04-14 DIAGNOSIS — F0280 Dementia in other diseases classified elsewhere without behavioral disturbance: Secondary | ICD-10-CM | POA: Diagnosis not present

## 2016-04-15 ENCOUNTER — Ambulatory Visit
Admission: RE | Admit: 2016-04-15 | Discharge: 2016-04-15 | Disposition: A | Discharge status: Home / Self Care | Payer: PPO | Source: Ambulatory Visit | Attending: Physician Assistant | Admitting: Physician Assistant

## 2016-04-15 DIAGNOSIS — M5126 Other intervertebral disc displacement, lumbar region: Secondary | ICD-10-CM | POA: Diagnosis not present

## 2016-04-15 DIAGNOSIS — M5432 Sciatica, left side: Secondary | ICD-10-CM | POA: Insufficient documentation

## 2016-04-15 DIAGNOSIS — M479 Spondylosis, unspecified: Secondary | ICD-10-CM | POA: Insufficient documentation

## 2016-04-15 DIAGNOSIS — M5021 Other cervical disc displacement,  high cervical region: Secondary | ICD-10-CM | POA: Diagnosis not present

## 2016-04-15 DIAGNOSIS — M5431 Sciatica, right side: Secondary | ICD-10-CM | POA: Diagnosis not present

## 2016-05-15 ENCOUNTER — Other Ambulatory Visit: Payer: Self-pay | Admitting: Urology

## 2016-05-15 DIAGNOSIS — N400 Enlarged prostate without lower urinary tract symptoms: Secondary | ICD-10-CM

## 2016-05-16 DIAGNOSIS — M5416 Radiculopathy, lumbar region: Secondary | ICD-10-CM | POA: Diagnosis not present

## 2016-05-16 DIAGNOSIS — M5136 Other intervertebral disc degeneration, lumbar region: Secondary | ICD-10-CM | POA: Diagnosis not present

## 2016-05-20 DIAGNOSIS — L89529 Pressure ulcer of left ankle, unspecified stage: Secondary | ICD-10-CM | POA: Diagnosis not present

## 2016-05-20 DIAGNOSIS — L03116 Cellulitis of left lower limb: Secondary | ICD-10-CM | POA: Diagnosis not present

## 2016-05-23 DIAGNOSIS — G309 Alzheimer's disease, unspecified: Secondary | ICD-10-CM | POA: Diagnosis not present

## 2016-05-23 DIAGNOSIS — R2681 Unsteadiness on feet: Secondary | ICD-10-CM | POA: Diagnosis not present

## 2016-05-23 DIAGNOSIS — I482 Chronic atrial fibrillation: Secondary | ICD-10-CM | POA: Diagnosis not present

## 2016-05-23 DIAGNOSIS — G4733 Obstructive sleep apnea (adult) (pediatric): Secondary | ICD-10-CM | POA: Diagnosis not present

## 2016-05-23 DIAGNOSIS — F0150 Vascular dementia without behavioral disturbance: Secondary | ICD-10-CM | POA: Diagnosis not present

## 2016-05-23 DIAGNOSIS — Z8659 Personal history of other mental and behavioral disorders: Secondary | ICD-10-CM | POA: Diagnosis not present

## 2016-05-23 DIAGNOSIS — F0280 Dementia in other diseases classified elsewhere without behavioral disturbance: Secondary | ICD-10-CM | POA: Diagnosis not present

## 2016-05-29 ENCOUNTER — Encounter: Payer: PPO | Attending: Surgery | Admitting: Surgery

## 2016-05-29 DIAGNOSIS — F0280 Dementia in other diseases classified elsewhere without behavioral disturbance: Secondary | ICD-10-CM | POA: Insufficient documentation

## 2016-05-29 DIAGNOSIS — Z7982 Long term (current) use of aspirin: Secondary | ICD-10-CM | POA: Diagnosis not present

## 2016-05-29 DIAGNOSIS — I4891 Unspecified atrial fibrillation: Secondary | ICD-10-CM | POA: Insufficient documentation

## 2016-05-29 DIAGNOSIS — Z885 Allergy status to narcotic agent status: Secondary | ICD-10-CM | POA: Insufficient documentation

## 2016-05-29 DIAGNOSIS — D649 Anemia, unspecified: Secondary | ICD-10-CM | POA: Insufficient documentation

## 2016-05-29 DIAGNOSIS — L89522 Pressure ulcer of left ankle, stage 2: Secondary | ICD-10-CM | POA: Insufficient documentation

## 2016-05-29 DIAGNOSIS — I341 Nonrheumatic mitral (valve) prolapse: Secondary | ICD-10-CM | POA: Insufficient documentation

## 2016-05-29 DIAGNOSIS — L89520 Pressure ulcer of left ankle, unstageable: Secondary | ICD-10-CM | POA: Diagnosis not present

## 2016-05-29 DIAGNOSIS — E785 Hyperlipidemia, unspecified: Secondary | ICD-10-CM | POA: Insufficient documentation

## 2016-05-29 DIAGNOSIS — I10 Essential (primary) hypertension: Secondary | ICD-10-CM | POA: Insufficient documentation

## 2016-05-29 DIAGNOSIS — N320 Bladder-neck obstruction: Secondary | ICD-10-CM | POA: Insufficient documentation

## 2016-05-29 DIAGNOSIS — Z79899 Other long term (current) drug therapy: Secondary | ICD-10-CM | POA: Diagnosis not present

## 2016-05-29 DIAGNOSIS — Z87891 Personal history of nicotine dependence: Secondary | ICD-10-CM | POA: Diagnosis not present

## 2016-05-30 NOTE — Progress Notes (Signed)
ZEPHEN, MARINEZ ([id number]) Visit Report for 05/29/2016 Chief Complaint Document Details Patient Name: [name], [name]. Date of Service: 05/29/2016 9:45 AM Medical Record Number: [id number] Patient Account Number: [id number] Date of Birth/Sex: 12-09-1934 (82 y.o. Male) Treating RN: Ahmed Prima Primary Care Provider: [name] Other Clinician: Referring Provider: Mariana Arn Treating Provider/Extender: [name] in Treatment: 0 Information Obtained from: Patient Chief Complaint Patient is at the clinic for treatment of an open pressure ulcer to the left ankle which she's had for about 5 weeks Electronic Signature(s) Signed: 05/29/2016 10:29:34 AM By: Christin Fudge MD, FACS Entered By: Christin Fudge on 05/29/2016 10:29:34 [name] ([id number]) -------------------------------------------------------------------------------- HPI Details Patient Name: [name], [name]. Date of Service: 05/29/2016 9:45 AM Medical Record Number: [id number] Patient Account Number: [id number] Date of Birth/Sex: [birth year] (82 y.o. Male) Treating RN: Ahmed Prima Primary Care Provider: [name] Other Clinician: Referring Provider: Mariana Arn Treating Provider/Extender: [name] in Treatment: 0 History of Present Illness Location: left ankle laterally Quality: Patient reports experiencing a dull pain to affected area(s). Severity: Patient states wound are getting worse. Duration: Patient has had the wound for < 5 weeks prior to presenting for treatment Timing: Pain in wound is Intermittent (comes and goes Context: The wound appeared gradually over time Modifying Factors: Other treatment(s) tried include:put on Bactroban and doxycycline Associated Signs and Symptoms: Patient reports having difficulty standing for long periods. HPI Description: 82 year old gentleman who is known to have a history of dementia but is fairly functional and is able to ambulate  has been having a pressure ulcer to the left ankle due to laying in bed for longer than he should. Past history of atrial fibrillation, dementia, hyperlipidemia, hypertension. He's also had some orthopedic related surgeries in the past and was a former smoker and is given up in 1969. Recently seen by his PCP who put him on doxycycline and asked him to apply Bactroban and see [place] at the wound clinic. Electronic Signature(s) Signed: 05/29/2016 10:31:35 AM By: Christin Fudge MD, FACS Entered By: Christin Fudge on 05/29/2016 10:31:35 [name] ([id number]) -------------------------------------------------------------------------------- Physical Exam Details Patient Name: [name], [name]. Date of Service: 05/29/2016 9:45 AM Medical Record Number: [id number] Patient Account Number: [id number] Date of Birth/Sex: [birth year] (82 y.o. Male) Treating RN: Ahmed Prima Primary Care Provider: [name] Other Clinician: Referring Provider: Mariana Arn Treating Provider/Extender: [name] in Treatment: 0 Constitutional . Pulse regular. Respirations normal and unlabored. Afebrile. . Eyes Nonicteric. Reactive to light. Ears, Nose, Mouth, and Throat Lips, teeth, and gums WNL.Marland Kitchen Moist mucosa without lesions. Neck supple and nontender. No palpable supraclavicular or cervical adenopathy. Normal sized without goiter. Respiratory WNL. No retractions.. Cardiovascular Pedal Pulses WNL. ABI on the left was 1.17 on the right is 1.25. No clubbing, cyanosis or edema. Chest Breasts symmetical and no nipple discharge.. Breast tissue WNL, no masses, lumps, or tenderness.. Gastrointestinal (GI) Abdomen without masses or tenderness.. No liver or spleen enlargement or tenderness.. Lymphatic No adneopathy. No adenopathy. No adenopathy. Musculoskeletal Adexa without tenderness or enlargement.. Digits and nails w/o clubbing, cyanosis, infection, petechiae, ischemia, or inflammatory  conditions.. Integumentary (Hair, Skin) No suspicious lesions. No crepitus or fluctuance. No peri-wound warmth or erythema. No masses.Marland Kitchen Psychiatric Judgement and insight Intact.. No evidence of depression, anxiety, or agitation.. Notes small ulcerated area on the left lateral ankle over the lateral malleolus with minimal eschar which is too adherent to sharply debride and it is tender. Electronic Signature(s) Signed: 05/29/2016 10:32:25 AM By:  Christin Fudge MD, FACS Previous Signature: 05/29/2016 10:32:01 AM Version By: Christin Fudge MD, FACS Entered By: Christin Fudge on 05/29/2016 10:32:25 HALDEN, MASIAS ([id number], BOYLES ([id number]) -------------------------------------------------------------------------------- Physician Orders Details Patient Name: [name], [name]. Date of Service: 05/29/2016 9:45 AM Medical Record Number: [id number] Patient Account Number: [id number] Date of Birth/Sex: June 13, 1934 (82 y.o. Male) Treating RN: Ahmed Prima Primary Care Provider: [name] Other Clinician: Referring Provider: Mariana Arn Treating Provider/Extender: [name] in Treatment: 0 Verbal / Phone Orders: No Diagnosis Coding Wound Cleansing Wound #1 Left,Lateral Malleolus o Clean wound with Normal Saline. o May Shower, gently pat wound dry prior to applying new dressing. Anesthetic Wound #1 Left,Lateral Malleolus o Topical Lidocaine 4% cream applied to wound bed prior to debridement - for clinic use Skin Barriers/Peri-Wound Care Wound #1 Left,Lateral Malleolus o Skin Prep Primary Wound Dressing Wound #1 Left,Lateral Malleolus o Medihoney gel Secondary Dressing Wound #1 Left,Lateral Malleolus o Dry Gauze o Boardered Foam Dressing Follow-up Appointments Wound #1 Left,Lateral Malleolus o Return Appointment in 1 week. Edema Control Wound #1 Left,Lateral Malleolus o Elevate legs to the level of the heart and pump ankles as often as  possible Off-Loading Wound #1 Left,Lateral Malleolus o Turn and reposition every 2 hours o Other: - keep pressure off of affected area [name], [name]. ([id number]) Additional Orders / Instructions Wound #1 Left,Lateral Malleolus o Increase protein intake. Medications-please add to medication list. Wound #1 Left,Lateral Malleolus o Other: - Vitamin A, Vitamin C, Zinc, MVI Patient Medications Allergies: morphine, tramadol, soy protein Notifications Medication Indication Start End MediHoney (honey) 05/29/2016 DOSE topical 100 % paste - paste topical as directed Electronic Signature(s) Signed: 05/29/2016 10:36:29 AM By: Christin Fudge MD, FACS Entered By: Christin Fudge on 05/29/2016 10:36:29 [name] ([id number]) -------------------------------------------------------------------------------- Problem List Details Patient Name: [name], [name]. Date of Service: 05/29/2016 9:45 AM Medical Record Number: [id number] Patient Account Number: [id number] Date of Birth/Sex: 1935/03/11 (82 y.o. Male) Treating RN: Ahmed Prima Primary Care Provider: [name] Other Clinician: Referring Provider: Mariana Arn Treating Provider/Extender: [name] in Treatment: 0 Active Problems ICD-10 Encounter Code Description Active Date Diagnosis L89.522 Pressure ulcer of left ankle, stage 2 05/29/2016 Yes F02.80 Dementia in other diseases classified elsewhere without 05/29/2016 Yes behavioral disturbance Inactive Problems Resolved Problems Electronic Signature(s) Signed: 05/29/2016 10:29:05 AM By: Christin Fudge MD, FACS Entered By: Christin Fudge on 05/29/2016 10:29:04 [name] ([id number]) -------------------------------------------------------------------------------- Progress Note Details Patient Name: [name], [name]. Date of Service: 05/29/2016 9:45 AM Medical Record Number: [id number] Patient Account Number: [id number] Date of Birth/Sex: 05/24/1934 (82  y.o. Male) Treating RN: Ahmed Prima Primary Care Provider: [name] Other Clinician: Referring Provider: Mariana Arn Treating Provider/Extender: [name] in Treatment: 0 Subjective Chief Complaint Information obtained from Patient Patient is at the clinic for treatment of an open pressure ulcer to the left ankle which she's had for about 5 weeks History of Present Illness (HPI) The following HPI elements were documented for the patient's wound: Location: left ankle laterally Quality: Patient reports experiencing a dull pain to affected area(s). Severity: Patient states wound are getting worse. Duration: Patient has had the wound for < 5 weeks prior to presenting for treatment Timing: Pain in wound is Intermittent (comes and goes Context: The wound appeared gradually over time Modifying Factors: Other treatment(s) tried include:put on Bactroban and doxycycline Associated Signs and Symptoms: Patient reports having difficulty standing for long periods. 82 year old gentleman who is known to have a history  of dementia but is fairly functional and is able to ambulate has been having a pressure ulcer to the left ankle due to laying in bed for longer than he should. Past history of atrial fibrillation, dementia, hyperlipidemia, hypertension. He's also had some orthopedic related surgeries in the past and was a former smoker and is given up in 1969. Recently seen by his PCP who put him on doxycycline and asked him to apply Bactroban and see [place] at the wound clinic. Wound History Patient presents with 1 open wound that has been present for approximately 4 weeks. Patient has been treating wound in the following manner: abt ointment. Laboratory tests have not been performed in the last month. Patient reportedly has not tested positive for an antibiotic resistant organism. Patient reportedly has not tested positive for osteomyelitis. Patient reportedly has not had testing  performed to evaluate circulation in the legs. Patient experiences the following problems associated with their wounds: swelling. Patient History Information obtained from Patient. Allergies morphine, tramadol, soy protein Family History RITHIK, SHOOTS ([id number]) Diabetes - Mother, Heart Disease - Father, Siblings, No family history of Cancer, Hereditary Spherocytosis, Hypertension, Kidney Disease, Lung Disease, Seizures, Stroke, Thyroid Problems, Tuberculosis. Social History Former smoker - quit for 40 yrs, Marital Status - Married, Alcohol Use - Never, Drug Use - No History, Caffeine Use - Rarely. Medical History Eyes Patient has history of Cataracts - surgery Hematologic/Lymphatic Patient has history of Anemia Respiratory Patient has history of Sleep Apnea Cardiovascular Patient has history of Arrhythmia - a-fib, Hypertension Neurologic Patient has history of Dementia Review of Systems (ROS) Constitutional Symptoms ([name]) The patient has no complaints or symptoms. Ear/Nose/Mouth/Throat The patient has no complaints or symptoms. Respiratory abestosis Cardiovascular hyperlipidemia hx mitral valve prolapse Gastrointestinal adenomatous polyps Endocrine The patient has no complaints or symptoms. Genitourinary bladder neck obstruction Immunological The patient has no complaints or symptoms. Integumentary (Skin) Complains or has symptoms of Wounds. Musculoskeletal The patient has no complaints or symptoms. Oncologic teratoma Medications: Norvasc, vitamin C, aspirin, BuSpar, Aricept, Namenda, Flomax [name], [name]. ([id number]) Objective Constitutional Pulse regular. Respirations normal and unlabored. Afebrile. Vitals Time Taken: 9:37 AM, Height: 69 in, Source: Stated, Weight: 174.8 lbs, Source: Measured, BMI: 25.8, Temperature: 97.5 F, Pulse: 61 bpm, Respiratory Rate: 16 breaths/min, Blood Pressure: 122/48 mmHg. Eyes Nonicteric. Reactive to  light. Ears, Nose, Mouth, and Throat Lips, teeth, and gums WNL.Marland Kitchen Moist mucosa without lesions. Neck supple and nontender. No palpable supraclavicular or cervical adenopathy. Normal sized without goiter. Respiratory WNL. No retractions.. Cardiovascular Pedal Pulses WNL. ABI on the left was 1.17 on the right is 1.25. No clubbing, cyanosis or edema. Chest Breasts symmetical and no nipple discharge.. Breast tissue WNL, no masses, lumps, or tenderness.. Gastrointestinal (GI) Abdomen without masses or tenderness.. No liver or spleen enlargement or tenderness.. Lymphatic No adneopathy. No adenopathy. No adenopathy. Musculoskeletal Adexa without tenderness or enlargement.. Digits and nails w/o clubbing, cyanosis, infection, petechiae, ischemia, or inflammatory conditions.Marland Kitchen Psychiatric Judgement and insight Intact.. No evidence of depression, anxiety, or agitation.. General Notes: small ulcerated area on the left lateral ankle over the lateral malleolus with minimal eschar which is too adherent to sharply debride and it is tender. LEA, [id number] ([id number]) Integumentary (Hair, Skin) No suspicious lesions. No crepitus or fluctuance. No peri-wound warmth or erythema. No masses.. Wound #1 status is Open. Original cause of wound was Pressure Injury. The wound is located on the Left,Lateral Malleolus. The wound measures 0.5cm length x 0.5cm width x 0.1cm depth; 0.196cm^2 area  and 0.02cm^3 volume. There is no undermining noted. There is a large amount of serosanguineous drainage noted. The wound margin is distinct with the outline attached to the wound base. There is no granulation within the wound bed. There is a large (67-100%) amount of necrotic tissue within the wound bed including Eschar. The periwound skin appearance exhibited: Erythema. The surrounding wound skin color is noted with erythema which is circumferential. Periwound temperature was noted as No Abnormality. The periwound has  tenderness on palpation. Assessment Active Problems ICD-10 L89.522 - Pressure ulcer of left ankle, stage 2 F02.80 - Dementia in other diseases classified elsewhere without behavioral disturbance 82 year old gentleman with dementia has been laying with his ankle position in bed in such a way that he has developed a superficial decubitus ulcer of the left lateral malleolus. he is mobile and has bodily functions and after review I have discussed with his wife who is his caregiver and recommended: 1. Medihoney to be applied daily, unprotected bordered foam 2. Off-Loading has been discussed in great detail 3. adequate protein, vitamin E, vitamin C and zinc 4. Regular visits to the wound center Plan Wound Cleansing: Wound #1 Left,Lateral Malleolus: Clean wound with Normal Saline. May Shower, gently pat wound dry prior to applying new dressing. Anesthetic: Wound #1 Left,Lateral Malleolus: Topical Lidocaine 4% cream applied to wound bed prior to debridement - for clinic use Skin Barriers/Peri-Wound Care: Wound #1 Left,Lateral Malleolus: Skin Prep [name], [name] ([id number]) Primary Wound Dressing: Wound #1 Left,Lateral Malleolus: Medihoney gel Secondary Dressing: Wound #1 Left,Lateral Malleolus: Dry Gauze Boardered Foam Dressing Follow-up Appointments: Wound #1 Left,Lateral Malleolus: Return Appointment in 1 week. Edema Control: Wound #1 Left,Lateral Malleolus: Elevate legs to the level of the heart and pump ankles as often as possible Off-Loading: Wound #1 Left,Lateral Malleolus: Turn and reposition every 2 hours Other: - keep pressure off of affected area Additional Orders / Instructions: Wound #1 Left,Lateral Malleolus: Increase protein intake. Medications-please add to medication list.: Wound #1 Left,Lateral Malleolus: Other: - Vitamin A, Vitamin C, Zinc, MVI The following medication(s) was prescribed: MediHoney (honey) topical 100 % paste paste topical as directed  starting 05/29/2016 82 year old gentleman with dementia has been laying with his ankle position in bed in such a way that he has developed a superficial decubitus ulcer of the left lateral malleolus. he is mobile and has bodily functions and after review I have discussed with his wife who is his caregiver and recommended: 1. Medihoney to be applied daily, unprotected bordered foam 2. Off-Loading has been discussed in great detail 3. adequate protein, vitamin E, vitamin C and zinc 4. Regular visits to the wound center Electronic Signature(s) Signed: 05/29/2016 3:36:03 PM By: Christin Fudge MD, FACS Previous Signature: 05/29/2016 10:36:48 AM Version By: Christin Fudge MD, FACS Previous Signature: 05/29/2016 10:35:27 AM Version By: Christin Fudge MD, FACS Entered By: Christin Fudge on 05/29/2016 15:36:03 [name] ([id number]) -------------------------------------------------------------------------------- ROS/PFSH Details Patient Name: [name], [name]. Date of Service: 05/29/2016 9:45 AM Medical Record Number: [id number] Patient Account Number: [id number] Date of Birth/Sex: [birth year] (82 y.o. Male) Treating RN: Ahmed Prima Primary Care Provider: [name] Other Clinician: Referring Provider: Mariana Arn Treating Provider/Extender: [name] in Treatment: 0 Information Obtained From Patient Wound History Do you currently have one or more open woundso Yes How many open wounds do you currently haveo 1 Approximately how long have you had your woundso 4 weeks How have you been treating your wound(s) until nowo abt ointment Has your wound(s) ever healed  and then re-openedo No Have you had any lab work done in the past montho No Have you tested positive for an antibiotic resistant organism (MRSA, VRE)o No Have you tested positive for osteomyelitis (bone infection)o No Have you had any tests for circulation on your legso No Have you had other problems associated with  your woundso Swelling Integumentary (Skin) Complaints and Symptoms: Positive for: Wounds Constitutional Symptoms (General Health) Complaints and Symptoms: No Complaints or Symptoms Eyes Medical History: Positive for: Cataracts - surgery Ear/Nose/Mouth/Throat Complaints and Symptoms: No Complaints or Symptoms Hematologic/Lymphatic Medical History: Positive for: Anemia Respiratory Complaints and Symptoms: Review of System Notes: [name], [name] ([id number]) abestosis Medical History: Positive for: Sleep Apnea Cardiovascular Complaints and Symptoms: Review of System Notes: hyperlipidemia hx mitral valve prolapse Medical History: Positive for: Arrhythmia - a-fib; Hypertension Gastrointestinal Complaints and Symptoms: Review of System Notes: adenomatous polyps Endocrine Complaints and Symptoms: No Complaints or Symptoms Genitourinary Complaints and Symptoms: Review of System Notes: bladder neck obstruction Immunological Complaints and Symptoms: No Complaints or Symptoms Musculoskeletal Complaints and Symptoms: No Complaints or Symptoms Neurologic Medical History: Positive for: Dementia 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 [name], [name] ([id number]) HBO Extended History Items Eyes: Cataracts Immunizations Pneumococcal Vaccine: Received Pneumococcal Vaccination: No Family and Social History Cancer: No; Diabetes: Yes - Mother; Heart Disease: Yes - Father, Siblings; Hereditary Spherocytosis: No; Hypertension: No; Kidney Disease: No; Lung Disease: No; Seizures: No; Stroke: No; Thyroid Problems: No; Tuberculosis: No; Former smoker - quit for 40 yrs; Marital Status - Married; Alcohol Use: Never; Drug Use: No History; Caffeine Use: Rarely; Financial Concerns: No; Food, Clothing or Shelter Needs: No; Support System Lacking: No; Transportation Concerns: No; Advanced Directives: No; Patient does not want information on Advanced  Directives; Do not resuscitate: No; Living Will: No; Medical Power of Attorney: No Physician Affirmation I have reviewed and agree with the above information. Electronic Signature(s) Signed: 05/29/2016 3:30:03 PM By: Christin Fudge MD, FACS Signed: 05/29/2016 3:37:28 PM By: Alric Quan Entered By: Christin Fudge on 05/29/2016 10:12:33 NARA, HASSO ([id number]) -------------------------------------------------------------------------------- SuperBill Details Patient Name: ROODY, THEBO. Date of Service: 05/29/2016 Medical Record Number: [id number] Patient Account Number: [id number] Date of Birth/Sex: 1935-02-22 (82 y.o. Male) Treating RN: Ahmed Prima Primary Care Provider: [name] Other Clinician: Referring Provider: Mariana Arn Treating Provider/Extender: [name] in Treatment: 0 Diagnosis Coding ICD-10 Codes Code Description [phone] Pressure ulcer of left ankle, stage 2 F02.80 Dementia in other diseases classified elsewhere without behavioral disturbance Facility Procedures CPT4 Code: [id number] Description: 99214 - WOUND CARE VISIT-LEV 4 EST PT Modifier: Quantity: 1 Physician Procedures CPT4: Description Modifier Quantity Code [id number] - WC PHYS LEVEL 4 - NEW PT 1 ICD-10 Description Diagnosis L89.522 Pressure ulcer of left ankle, stage 2 F02.80 Dementia in other diseases classified elsewhere without behavioral disturbance Electronic Signature(s) Signed: 05/29/2016 3:30:03 PM By: Christin Fudge MD, FACS Signed: 05/29/2016 3:37:28 PM By: Alric Quan Previous Signature: 05/29/2016 10:36:58 AM Version By: Christin Fudge MD, FACS Entered By: Alric Quan on 05/29/2016 11:01:34

## 2016-05-30 NOTE — Progress Notes (Signed)
Jared, Ho ([id number]) Visit Report for 05/29/2016 Allergy List Details Patient Name: [name], [name]. Date of Service: 05/29/2016 9:45 AM Medical Record Number: [id number] Patient Account Number: [id number] Date of Birth/Sex: 22-Jun-1934 (82 y.o. Male) Treating RN: Ahmed Prima Primary Care Keiondre Colee: [name] Other Clinician: Referring Aeris Hersman: Mariana Arn Treating Jayliani Wanner/Extender: [name] in Treatment: 0 Allergies Active Allergies morphine tramadol soy protein Allergy Notes Electronic Signature(s) Signed: 05/29/2016 3:37:28 PM By: Alric Quan Entered By: Alric Quan on 05/29/2016 09:41:20 [name] ([id number]) -------------------------------------------------------------------------------- Arrival Information Details Patient Name: Jared, Ho. Date of Service: 05/29/2016 9:45 AM Medical Record Number: [id number] Patient Account Number: [id number] Date of Birth/Sex: 12/10/1934 (82 y.o. Male) Treating RN: Ahmed Prima Primary Care Shota Kohrs: [name] Other Clinician: Referring Oliver Neuwirth: Mariana Arn Treating Tameia Rafferty/Extender: [name] in Treatment: 0 Visit Information Patient Arrived: Ambulatory Arrival Time: 09:37 Accompanied By: wife Transfer Assistance: None Patient Identification Verified: Yes Secondary Verification Process Yes Completed: Patient Requires Transmission-Based No Precautions: Patient Has Alerts: Yes Patient Alerts: ASA Electronic Signature(s) Signed: 05/29/2016 3:37:28 PM By: Alric Quan Entered By: Alric Quan on 05/29/2016 09:37:27 [name] ([id number]) -------------------------------------------------------------------------------- Clinic Level of Care Assessment Details Patient Name: [name]. Date of Service: 05/29/2016 9:45 AM Medical Record Number: [id number] Patient Account Number: [id number] Date of Birth/Sex: 08/17/1934 (82 y.o. Male) Treating  RN: Ahmed Prima Primary Care Gerre Ranum: [name] Other Clinician: Referring Alexios Keown: Mariana Arn Treating Norah Devin/Extender: [name] in Treatment: 0 Clinic Level of Care Assessment Items TOOL 2 Quantity Score X - Use when only an EandM is performed on the INITIAL visit 1 0 ASSESSMENTS - Nursing Assessment / Reassessment X - General Physical Exam (combine w/ comprehensive assessment (listed just 1 20 below) when performed on new pt. evals) X - Comprehensive Assessment (HX, ROS, Risk Assessments, Wounds Hx, etc.) 1 25 ASSESSMENTS - Wound and Skin Assessment / Reassessment X - Simple Wound Assessment / Reassessment - one wound 1 5 []  - Complex Wound Assessment / Reassessment - multiple wounds 0 []  - Dermatologic / Skin Assessment (not related to wound area) 0 ASSESSMENTS - Ostomy and/or Continence Assessment and Care []  - Incontinence Assessment and Management 0 []  - Ostomy Care Assessment and Management (repouching, etc.) 0 PROCESS - Coordination of Care []  - Simple Patient / Family Education for ongoing care 0 X - Complex (extensive) Patient / Family Education for ongoing care 1 20 X - Staff obtains Programmer, systems, Records, Test Results / Process Orders 1 10 []  - Staff telephones HHA, Nursing Homes / Clarify orders / etc 0 []  - Routine Transfer to another Facility (non-emergent condition) 0 []  - Routine Hospital Admission (non-emergent condition) 0 []  - New Admissions / Biomedical engineer / Ordering NPWT, Apligraf, etc. 0 []  - Emergency Hospital Admission (emergent condition) 0 X - Simple Discharge Coordination 1 10 [place]LE[place], MOAK. ([id number]) []  - Complex (extensive) Discharge Coordination 0 PROCESS - Special Needs []  - Pediatric / Minor Patient Management 0 []  - Isolation Patient Management 0 []  - Hearing / Language / Visual special needs 0 []  - Assessment of Community assistance (transportation, D/C planning, etc.) 0 []  - Additional assistance /  Altered mentation 0 []  - Support Surface(s) Assessment (bed, cushion, seat, etc.) 0 INTERVENTIONS - Wound Cleansing / Measurement X - Wound Imaging (photographs - any number of wounds) 1 5 []  - Wound Tracing (instead of photographs) 0 X - Simple Wound Measurement - one wound 1 5 []  - Complex Wound Measurement -  multiple wounds 0 X - Simple Wound Cleansing - one wound 1 5 []  - Complex Wound Cleansing - multiple wounds 0 INTERVENTIONS - Wound Dressings X - Small Wound Dressing one or multiple wounds 1 10 []  - Medium Wound Dressing one or multiple wounds 0 []  - Large Wound Dressing one or multiple wounds 0 X - Application of Medications - injection 1 10 INTERVENTIONS - Miscellaneous []  - External ear exam 0 []  - Specimen Collection (cultures, biopsies, blood, body fluids, etc.) 0 []  - Specimen(s) / Culture(s) sent or taken to Lab for analysis 0 []  - Patient Transfer (multiple staff / [name] / Similar devices) 0 []  - Simple Staple / Suture removal (25 or less) 0 []  - Complex Staple / Suture removal (26 or more) 0 Loth, Dolph M. ([id number]) []  - Hypo / Hyperglycemic Management (close monitor of Blood Glucose) 0 X - Ankle / Brachial Index (ABI) - do not check if billed separately 1 15 Has the patient been seen at the hospital within the last three years: Yes Total Score: 140 Level Of Care: New/Established - Level 4 Electronic Signature(s) Signed: 05/29/2016 3:37:28 PM By: Alric Quan Entered By: Alric Quan on 05/29/2016 11:01:11 [name] ([id number]) -------------------------------------------------------------------------------- Encounter Discharge Information Details Patient Name: Jared, Ho. Date of Service: 05/29/2016 9:45 AM Medical Record Number: [id number] Patient Account Number: [id number] Date of Birth/Sex: Jun 03, 1934 (82 y.o. Male) Treating RN: Ahmed Prima Primary Care Roseland Braun: [name] Other Clinician: Referring Delano Scardino: Mariana Arn Treating Mariacristina Aday/Extender: [name] in Treatment: 0 Encounter Discharge Information Items Discharge Pain Level: 0 Discharge Condition: Stable Ambulatory Status: Ambulatory Discharge Destination: Home Transportation: Private Auto Accompanied By: wife Schedule Follow-up Appointment: No Medication Reconciliation completed and provided to Patient/Care Yes Embree Brawley: Provided on Clinical Summary of Care: 05/29/2016 Form Type Recipient Paper Patient ET Electronic Signature(s) Signed: 05/29/2016 10:34:30 AM By: Ruthine Dose Entered By: Ruthine Dose on 05/29/2016 10:34:29 [name] ([id number]) -------------------------------------------------------------------------------- Lower Extremity Assessment Details Patient Name: [name]. Date of Service: 05/29/2016 9:45 AM Medical Record Number: [id number] Patient Account Number: [id number] Date of Birth/Sex: [birth year] (82 y.o. Male) Treating RN: Ahmed Prima Primary Care Lannette Avellino: [name] Other Clinician: Referring Khloei Spiker: Mariana Arn Treating Jasiel Apachito/Extender: [name] in Treatment: 0 Vascular Assessment Pulses: Dorsalis Pedis Palpable: [Left:Yes] [Right:Yes] Posterior Tibial Extremity colors, hair growth, and conditions: Extremity Color: [Left:Normal] [Right:Normal] Hair Growth on Extremity: [Left:Yes] [Right:Yes] Temperature of Extremity: [Left:Warm] [Right:Warm] Capillary Refill: [Left:< 3 seconds] [Right:< 3 seconds] Blood Pressure: Brachial: [Left:120] [Right:120] Dorsalis Pedis: 130 [Left:Dorsalis Pedis: [id number] Ankle: Posterior Tibial: 140 [Left:Posterior Tibial: 150 1.17] [Right:1.25] Toe Nail Assessment Left: Right: Thick: No No Discolored: No No Deformed: No No Improper Length and Hygiene: No No Electronic Signature(s) Signed: 05/29/2016 3:37:28 PM By: Alric Quan Entered By: Alric Quan on 05/29/2016 10:05:07 [name]  ([id number]) -------------------------------------------------------------------------------- Multi Wound Chart Details Patient Name: [name]. Date of Service: 05/29/2016 9:45 AM Medical Record Number: [id number] Patient Account Number: [id number] Date of Birth/Sex: 1935-02-28 (82 y.o. Male) Treating RN: Ahmed Prima Primary Care Barrie Wale: [name] Other Clinician: Referring Giovani Neumeister: Mariana Arn Treating Irelyn Perfecto/Extender: [name] in Treatment: 0 Vital Signs Height(in): 69 Pulse(bpm): 61 Weight(lbs): 174.8 Blood Pressure 122/48 (mmHg): Body Mass Index(BMI): 26 Temperature(F): 97.5 Respiratory Rate 16 (breaths/min): Photos: [1:No Photos] [N/A:N/A] Wound Location: [1:Left Malleolus - Lateral] [N/A:N/A] Wounding Event: [1:Pressure Injury] [N/A:N/A] Primary Etiology: [1:Pressure Ulcer] [N/A:N/A] Comorbid History: [1:Cataracts, Anemia, Sleep Apnea, Arrhythmia, Hypertension, Dementia] [N/A:N/A] Date Acquired: [  1:05/01/2016] [N/A:N/A] Weeks of Treatment: [1:0] [N/A:N/A] Wound Status: [1:Open] [N/A:N/A] Measurements L x W x D 0.5x0.5x0.1 [N/A:N/A] (cm) Area (cm) : [1:0.196] [N/A:N/A] Volume (cm) : [1:0.02] [N/A:N/A] Classification: [1:Category/Stage II] [N/A:N/A] Exudate Amount: [1:Small] [N/A:N/A] Exudate Type: [1:Serosanguineous] [N/A:N/A] Exudate Color: [1:red, brown] [N/A:N/A] Wound Margin: [1:Distinct, outline attached] [N/A:N/A] Granulation Amount: [1:None Present (0%)] [N/A:N/A] Necrotic Amount: [1:Large (67-100%)] [N/A:N/A] Necrotic Tissue: [1:Eschar] [N/A:N/A] Epithelialization: [1:None] [N/A:N/A] Periwound Skin Texture: No Abnormalities Noted [N/A:N/A] Periwound Skin [1:No Abnormalities Noted] [N/A:N/A] Moisture: Periwound Skin Color: Erythema: Yes [N/A:N/A] Erythema Location: [1:Circumferential] [N/A:N/A] Temperature: [1:No Abnormality] [N/A:N/A] Tenderness on Yes N/A N/A Palpation: Wound Preparation: Ulcer Cleansing: N/A  N/A Rinsed/Irrigated with Saline Topical Anesthetic Applied: Other: lidocaine 4% Treatment Notes Electronic Signature(s) Signed: 05/29/2016 10:29:09 AM By: Christin Fudge MD, FACS Entered By: Christin Fudge on 05/29/2016 10:29:09 [name] ([id number]) -------------------------------------------------------------------------------- [place] Details Patient Name: JAIVON, NICOLOFF. Date of Service: 05/29/2016 9:45 AM Medical Record Number: [id number] Patient Account Number: [id number] Date of Birth/Sex: Sep 19, 1934 (82 y.o. Male) Treating RN: Ahmed Prima Primary Care Nuriyah Hanline: [name] Other Clinician: Referring Lauriana Denes: Mariana Arn Treating Reinhart Saulters/Extender: [name] in Treatment: 0 Active Inactive ` Abuse / Safety / Falls / Self Care Management Nursing Diagnoses: Potential for falls Goals: Patient will remain injury free Date Initiated: 05/29/2016 Target Resolution Date: 08/02/2016 Goal Status: Active Interventions: Assess fall risk on admission and as needed Assess impairment of mobility on admission and as needed per policy Notes: ` Nutrition Nursing Diagnoses: Imbalanced nutrition Goals: Patient/caregiver agrees to and verbalizes understanding of need to use nutritional supplements and/or vitamins as prescribed Date Initiated: 05/29/2016 Target Resolution Date: 08/02/2016 Goal Status: Active Interventions: Assess patient nutrition upon admission and as needed per policy Notes: ` Orientation to the Wound Care Program Nursing Diagnoses: GERREL, SALMELA ([id number]) Knowledge deficit related to the wound healing center program Goals: Patient/caregiver will verbalize understanding of the [place] Program Date Initiated: 05/29/2016 Target Resolution Date: 06/14/2016 Goal Status: Active Interventions: Provide education on orientation to the wound center Notes: ` Pain, Acute or Chronic Nursing  Diagnoses: Pain, acute or chronic: actual or potential Potential alteration in comfort, pain Goals: Patient will verbalize adequate pain control and receive pain control interventions during procedures as needed Date Initiated: 05/29/2016 Target Resolution Date: 08/02/2016 Goal Status: Active Interventions: Complete pain assessment as per visit requirements Notes: ` Wound/Skin Impairment Nursing Diagnoses: Impaired tissue integrity Knowledge deficit related to ulceration/compromised skin integrity Goals: Ulcer/skin breakdown will have a volume reduction of 80% by week 12 Date Initiated: 05/29/2016 Target Resolution Date: 08/02/2016 Goal Status: Active Interventions: Assess patient/caregiver ability to perform ulcer/skin care regimen upon admission and as needed Assess ulceration(s) every visit Notes: IVIN, KONECNY ([id number]) Electronic Signature(s) Signed: 05/29/2016 3:37:28 PM By: Alric Quan Entered By: Alric Quan on 05/29/2016 10:17:31 [name] ([id number]) -------------------------------------------------------------------------------- Pain Assessment Details Patient Name: JW, VELASCO. Date of Service: 05/29/2016 9:45 AM Medical Record Number: [id number] Patient Account Number: [id number] Date of Birth/Sex: 14-Mar-1935 (82 y.o. Male) Treating RN: Ahmed Prima Primary Care Knoxx Boeding: [name] Other Clinician: Referring Gunnar Hereford: Mariana Arn Treating Damean Poffenberger/Extender: [name] in Treatment: 0 Active Problems Location of Pain Severity and Description of Pain Patient Has Paino No Site Locations With Dressing Change: No Pain Management and Medication Current Pain Management: Electronic Signature(s) Signed: 05/29/2016 3:37:28 PM By: Alric Quan Entered By: Alric Quan on 05/29/2016 09:37:32 [name]  ([id number]) -------------------------------------------------------------------------------- Patient/Caregiver Education Details Patient Name: ERYC, KEITZ. Date  of Service: 05/29/2016 9:45 AM Medical Record Number: [id number] Patient Account Number: [id number] Date of Birth/Gender: 10-20-1934 (82 y.o. Male) Treating RN: Ahmed Prima Primary Care Physician: [name] Other Clinician: Referring Physician: Mariana Arn Treating Physician/Extender: [name] in Treatment: 0 Education Assessment Education Provided To: Patient Education Topics Provided Welcome To [facility]: Handouts: Welcome To [facility] Methods: Explain/Verbal Responses: State content correctly Wound/Skin Impairment: Handouts: Other: change dressing as ordered Methods: Demonstration, Explain/Verbal Responses: State content correctly Electronic Signature(s) Signed: 05/29/2016 3:37:28 PM By: Alric Quan Entered By: Alric Quan on 05/29/2016 10:22:05 [name] ([id number]) -------------------------------------------------------------------------------- Wound Assessment Details Patient Name: [name], [name]. Date of Service: 05/29/2016 9:45 AM Medical Record Number: [id number] Patient Account Number: [id number] Date of Birth/Sex: 04/08/1935 (82 y.o. Male) Treating RN: Ahmed Prima Primary Care Lakelyn Straus: [name] Other Clinician: Referring Lakenzie Mcclafferty: Mariana Arn Treating Amillion Macchia/Extender: [name] in Treatment: 0 Wound Status Wound Number: 1 Primary Pressure Ulcer Etiology: Wound Location: Left Malleolus - Lateral Wound Open Wounding Event: Pressure Injury Status: Date Acquired: 05/01/2016 Comorbid Cataracts, Anemia, Sleep Apnea, Weeks Of Treatment: 0 History: Arrhythmia, Hypertension, Dementia Clustered Wound: No Photos Wound Measurements Length: (cm) 0.5 Width: (cm) 0.5 Depth: (cm) 0.1 Area: (cm) 0.196 Volume: (cm)  0.02 % Reduction in Area: 0% % Reduction in Volume: 0% Epithelialization: None Undermining: No Wound Description Classification: Category/Stage II Foul Odor Afte Wound Margin: Distinct, outline attached Slough/Fibrino Exudate Amount: Large Exudate Type: Serosanguineous Exudate Color: red, brown r Cleansing: No No Wound Bed Granulation Amount: None Present (0%) Necrotic Amount: Large (67-100%) Necrotic Quality: Eschar Periwound Skin Texture Texture Color STEPHANO, CAY ([id number]) No Abnormalities Noted: No No Abnormalities Noted: No Erythema: Yes Moisture Erythema Location: Circumferential No Abnormalities Noted: No Temperature / Pain Temperature: No Abnormality Tenderness on Palpation: Yes Wound Preparation Ulcer Cleansing: Rinsed/Irrigated with Saline Topical Anesthetic Applied: Other: lidocaine 4%, Treatment Notes Wound #1 (Left, Lateral Malleolus) 1. Cleansed with: Clean wound with Normal Saline 2. Anesthetic Topical Lidocaine 4% cream to wound bed prior to debridement 3. Peri-wound Care: Skin Prep 4. Dressing Applied: Medihoney Gel 5. Secondary Dressing Applied Bordered Foam Dressing Dry Gauze Electronic Signature(s) Signed: 05/29/2016 3:37:28 PM By: Alric Quan Entered By: Alric Quan on 05/29/2016 14:20:14 [name] ([id number]) -------------------------------------------------------------------------------- Vitals Details Patient Name: HASSEL, TETTERTON. Date of Service: 05/29/2016 9:45 AM Medical Record Number: [id number] Patient Account Number: [id number] Date of Birth/Sex: 18-May-1934 (82 y.o. Male) Treating RN: Ahmed Prima Primary Care Shaketha Jeon: [name] Other Clinician: Referring Fawzi Melman: Mariana Arn Treating Kilynn Fitzsimmons/Extender: [name] in Treatment: 0 Vital Signs Time Taken: 09:37 Temperature (F): 97.5 Height (in): 69 Pulse (bpm): 61 Source: Stated Respiratory Rate (breaths/min): 16 Weight  (lbs): 174.8 Blood Pressure (mmHg): 122/48 Source: Measured Reference Range: 80 - 120 mg / dl Body Mass Index (BMI): 25.8 Electronic Signature(s) Signed: 05/29/2016 3:37:28 PM By: Alric Quan Entered By: Alric Quan on 05/29/2016 09:39:50

## 2016-05-30 NOTE — Progress Notes (Signed)
RALPHEL, WOHLRAB ([id number]) Visit Report for 05/29/2016 Abuse/Suicide Risk Screen Details Patient Name: [name], [name]. Date of Service: 05/29/2016 9:45 AM Medical Record Number: [id number] Patient Account Number: [id number] Date of Birth/Sex: 29-Jan-1935 (82 y.o. Male) Treating RN: Ahmed Prima Primary Care Nyal Schachter: [name] Other Clinician: Referring Caston Coopersmith: Mariana Arn Treating Arval Brandstetter/Extender: [name] in Treatment: 0 Abuse/Suicide Risk Screen Items Answer ABUSE/SUICIDE RISK SCREEN: Has anyone close to you tried to hurt or harm you recentlyo No Do you feel uncomfortable with anyone in your familyo No Has anyone forced you do things that you didnot want to doo No Do you have any thoughts of harming yourselfo No Patient displays signs or symptoms of abuse and/or neglect. No Electronic Signature(s) Signed: 05/29/2016 3:37:28 PM By: Alric Quan Entered By: Alric Quan on 05/29/2016 09:49:30 [name] ([id number]) -------------------------------------------------------------------------------- Activities of Daily Living Details Patient Name: [name], [name]. Date of Service: 05/29/2016 9:45 AM Medical Record Number: [id number] Patient Account Number: [id number] Date of Birth/Sex: 08/03/1934 (82 y.o. Male) Treating RN: Ahmed Prima Primary Care Katrinia Straker: [name] Other Clinician: Referring Meridian Scherger: Mariana Arn Treating Valerie Fredin/Extender: [name] in Treatment: 0 Activities of Daily Living Items Answer Activities of Daily Living (Please select one for each item) Drive Automobile Not Able Take Medications Not Able Use Telephone Not Able Care for Appearance Need Assistance Use Toilet Completely Able Bath / Shower Need Assistance Dress Self Completely Able Feed Self Completely Able Walk Completely Able Get In / Out Bed Completely Able Housework Need Assistance Prepare Meals Not Able Handle Money Not  Able Shop for Self Not Able Electronic Signature(s) Signed: 05/29/2016 3:37:28 PM By: Alric Quan Entered By: Alric Quan on 05/29/2016 09:50:01 [name] ([id number]) -------------------------------------------------------------------------------- Education Assessment Details Patient Name: [name]. Date of Service: 05/29/2016 9:45 AM Medical Record Number: [id number] Patient Account Number: [id number] Date of Birth/Sex: 1934/05/23 (82 y.o. Male) Treating RN: Ahmed Prima Primary Care Shenika Quint: [name] Other Clinician: Referring Linzey Ramser: Mariana Arn Treating Nazaria Riesen/Extender: [name] in Treatment: 0 Primary Learner Assessed: Patient Learning Preferences/Education Level/Primary Language Learning Preference: Explanation, Printed Material Highest Education Level: College or Above Preferred Language: English Cognitive Barrier Assessment/Beliefs Language Barrier: No Translator Needed: No Memory Deficit: Yes dementia Emotional Barrier: No Cultural/Religious Beliefs Affecting Medical No Care: Physical Barrier Assessment Impaired Vision: No Impaired Hearing: No Decreased Hand dexterity: No Knowledge/Comprehension Assessment Knowledge Level: Medium Comprehension Level: Medium Ability to understand written Medium instructions: Ability to understand verbal Medium instructions: Motivation Assessment Anxiety Level: Calm Cooperation: Cooperative Education Importance: Acknowledges Need Interest in Health Problems: Asks Questions Perception: Coherent Willingness to Engage in Self- High Management Activities: Readiness to Engage in Self- High Management Activities: Electronic Signature(s) [name], [name] ([id number]) Signed: 05/29/2016 3:37:28 PM By: Alric Quan Entered By: Alric Quan on 05/29/2016 09:50:32 [name], [name]  ([id number]) -------------------------------------------------------------------------------- Fall Risk Assessment Details Patient Name: [name]. Date of Service: 05/29/2016 9:45 AM Medical Record Number: [id number] Patient Account Number: [id number] Date of Birth/Sex: 1935/01/28 (82 y.o. Male) Treating RN: Ahmed Prima Primary Care Concha Sudol: [name] Other Clinician: Referring Takeila Thayne: Mariana Arn Treating Anvi Mangal/Extender: [name] in Treatment: 0 Fall Risk Assessment Items Have you had 2 or more falls in the last 12 monthso 0 Yes Have you had any fall that resulted in injury in the last 12 monthso 0 Yes FALL RISK ASSESSMENT: History of falling - immediate or within 3 months 25 Yes Secondary diagnosis 15 Yes Ambulatory aid None/bed rest/wheelchair/nurse  0 No Crutches/cane/walker 0 No Furniture 0 No IV Access/Saline Lock 0 No Gait/Training Normal/bed rest/immobile 0 No Weak 10 Yes Impaired 20 Yes Mental Status Oriented to own ability 0 No Electronic Signature(s) Signed: 05/29/2016 3:37:28 PM By: Alric Quan Entered By: Alric Quan on 05/29/2016 09:51:48 [name] ([id number]) -------------------------------------------------------------------------------- Foot Assessment Details Patient Name: [name], [name]. Date of Service: 05/29/2016 9:45 AM Medical Record Number: [id number] Patient Account Number: [id number] Date of Birth/Sex: [birth year] (82 y.o. Male) Treating RN: Ahmed Prima Primary Care Stephanie Mcglone: [name] Other Clinician: Referring Tyrome Donatelli: Mariana Arn Treating Yanelie Abraha/Extender: [name] in Treatment: 0 Foot Assessment Items Site Locations + = Sensation present, - = Sensation absent, C = Callus, U = Ulcer R = Redness, W = Warmth, M = Maceration, PU = Pre-ulcerative lesion F = Fissure, S = Swelling, D = Dryness Assessment Right: Left: Other Deformity: No No Prior Foot Ulcer: No No Prior  Amputation: No No Charcot Joint: No No Ambulatory Status: Ambulatory Without Help Gait: Steady Electronic Signature(s) Signed: 05/29/2016 3:37:28 PM By: Alric Quan Entered By: Alric Quan on 05/29/2016 09:55:41 [name] ([id number]) -------------------------------------------------------------------------------- Nutrition Risk Assessment Details Patient Name: [name], [name]. Date of Service: 05/29/2016 9:45 AM Medical Record Number: [id number] Patient Account Number: [id number] Date of Birth/Sex: [birth year] (82 y.o. Male) Treating RN: Ahmed Prima Primary Care Maleki Hippe: [name] Other Clinician: Referring Michele Kerlin: Mariana Arn Treating Shakiya Mcneary/Extender: [name] in Treatment: 0 Height (in): 69 Weight (lbs): 174.8 Body Mass Index (BMI): 25.8 Nutrition Risk Assessment Items NUTRITION RISK SCREEN: I have an illness or condition that made me change the kind and/or 2 Yes amount of food I eat I eat fewer than two meals per day 0 No I eat few fruits and vegetables, or milk products 0 No I have three or more drinks of beer, liquor or wine almost every day 0 No I have tooth or mouth problems that make it hard for me to eat 0 No I don't always have enough money to buy the food I need 0 No I eat alone most of the time 0 No I take three or more different prescribed or over-the-counter drugs a 1 Yes day Without wanting to, I have lost or gained 10 pounds in the last six 0 No months I am not always physically able to shop, cook and/or feed myself 0 No Nutrition Protocols Good Risk Protocol Moderate Risk Protocol Electronic Signature(s) Signed: 05/29/2016 3:37:28 PM By: Alric Quan Entered By: Alric Quan on 05/29/2016 09:52:10

## 2016-06-02 ENCOUNTER — Ambulatory Visit
Admission: RE | Admit: 2016-06-02 | Discharge: 2016-06-02 | Disposition: A | Discharge status: Home / Self Care | Payer: PPO | Source: Ambulatory Visit | Attending: Surgery | Admitting: Surgery

## 2016-06-02 ENCOUNTER — Other Ambulatory Visit: Payer: Self-pay | Admitting: Surgery

## 2016-06-02 DIAGNOSIS — S81802A Unspecified open wound, left lower leg, initial encounter: Secondary | ICD-10-CM

## 2016-06-02 DIAGNOSIS — X58XXXA Exposure to other specified factors, initial encounter: Secondary | ICD-10-CM | POA: Insufficient documentation

## 2016-06-02 DIAGNOSIS — L97929 Non-pressure chronic ulcer of unspecified part of left lower leg with unspecified severity: Secondary | ICD-10-CM | POA: Diagnosis not present

## 2016-06-03 ENCOUNTER — Ambulatory Visit: Payer: PPO | Admitting: Physical Therapy

## 2016-06-05 ENCOUNTER — Encounter: Payer: PPO | Attending: Surgery | Admitting: Surgery

## 2016-06-05 DIAGNOSIS — D649 Anemia, unspecified: Secondary | ICD-10-CM | POA: Diagnosis not present

## 2016-06-05 DIAGNOSIS — I4891 Unspecified atrial fibrillation: Secondary | ICD-10-CM | POA: Diagnosis not present

## 2016-06-05 DIAGNOSIS — G4730 Sleep apnea, unspecified: Secondary | ICD-10-CM | POA: Diagnosis not present

## 2016-06-05 DIAGNOSIS — F0280 Dementia in other diseases classified elsewhere without behavioral disturbance: Secondary | ICD-10-CM | POA: Insufficient documentation

## 2016-06-05 DIAGNOSIS — Z87891 Personal history of nicotine dependence: Secondary | ICD-10-CM | POA: Insufficient documentation

## 2016-06-05 DIAGNOSIS — L89522 Pressure ulcer of left ankle, stage 2: Secondary | ICD-10-CM | POA: Diagnosis not present

## 2016-06-05 DIAGNOSIS — E785 Hyperlipidemia, unspecified: Secondary | ICD-10-CM | POA: Diagnosis not present

## 2016-06-05 DIAGNOSIS — I10 Essential (primary) hypertension: Secondary | ICD-10-CM | POA: Diagnosis not present

## 2016-06-06 NOTE — Progress Notes (Signed)
DALBERT, DIPONIO ([id number]) Visit Report for 06/05/2016 Arrival Information Details Patient Name: [name], [name]. Date of Service: 06/05/2016 11:00 AM Medical Record Number: [id number] Patient Account Number: [id number] Date of Birth/Sex: 07-30-1934 (82 y.o. Male) Treating RN: Ahmed Prima Primary Care Britani Beattie: [name] Other Clinician: Referring Haidar Muse: [name] Treating Shaniah Baltes/Extender: [name] in Treatment: 1 Visit Information History Since Last Visit All ordered tests and consults were completed: No Patient Arrived: Ambulatory Added or deleted any medications: No Arrival Time: 11:00 Any new allergies or adverse reactions: No Accompanied By: wife Had a fall or experienced change in No Transfer Assistance: None activities of daily living that may affect Patient Identification Verified: Yes risk of falls: Secondary Verification Process Yes Signs or symptoms of abuse/neglect since last No Completed: visito Patient Requires Transmission-Based No Hospitalized since last visit: No Precautions: Has Dressing in Place as Prescribed: Yes Patient Has Alerts: Yes Pain Present Now: No Patient Alerts: ASA Electronic Signature(s) Signed: 06/05/2016 2:38:46 PM By: Alric Quan Entered By: Alric Quan on 06/05/2016 11:00:32 [name] ([id number]) -------------------------------------------------------------------------------- Encounter Discharge Information Details Patient Name: [name]. Date of Service: 06/05/2016 11:00 AM Medical Record Number: [id number] Patient Account Number: [id number] Date of Birth/Sex: [birth year] (82 y.o. Male) Treating RN: Ahmed Prima Primary Care Jalayne Ganesh: [name] Other Clinician: Referring Savreen Gebhardt: [name] Treating Margarito Dehaas/Extender: [name] in Treatment: 1 Encounter Discharge Information Items Discharge Pain Level: 0 Discharge Condition: Stable Ambulatory Status:  Ambulatory Discharge Destination: Home Transportation: Private Auto Accompanied By: wife Schedule Follow-up Appointment: Yes Medication Reconciliation completed and provided to Patient/Care No Kadience Macchi: Provided on Clinical Summary of Care: 06/05/2016 Form Type Recipient Paper Patient ET Electronic Signature(s) Signed: 06/05/2016 11:28:04 AM By: Ruthine Dose Entered By: Ruthine Dose on 06/05/2016 11:28:03 [name] ([id number]) -------------------------------------------------------------------------------- Lower Extremity Assessment Details Patient Name: [name]. Date of Service: 06/05/2016 11:00 AM Medical Record Number: [id number] Patient Account Number: [id number] Date of Birth/Sex: 13-Mar-1935 (82 y.o. Male) Treating RN: Ahmed Prima Primary Care Magnum Lunde: [name] Other Clinician: Referring Bogdan Vivona: [name] Treating Tamberlyn Midgley/Extender: [name] in Treatment: 1 Vascular Assessment Pulses: Dorsalis Pedis Palpable: [Left:Yes] Posterior Tibial Extremity colors, hair growth, and conditions: Extremity Color: [Left:Normal] Temperature of Extremity: [Left:Warm] Capillary Refill: [Left:< 3 seconds] Dependent Rubor: [Right:No] Electronic Signature(s) Signed: 06/05/2016 2:38:46 PM By: Alric Quan Entered By: Alric Quan on 06/05/2016 11:10:39 [name] ([id number]) -------------------------------------------------------------------------------- Multi Wound Chart Details Patient Name: [name]. Date of Service: 06/05/2016 11:00 AM Medical Record Number: [id number] Patient Account Number: [id number] Date of Birth/Sex: 1935-03-03 (82 y.o. Male) Treating RN: Ahmed Prima Primary Care Kanyia Heaslip: [name] Other Clinician: Referring Milderd Manocchio: [name] Treating Roylene Heaton/Extender: [name] in Treatment: 1 Vital Signs Height(in): 69 Pulse(bpm): 55 Weight(lbs): 174.8 Blood  Pressure 151/56 (mmHg): Body Mass Index(BMI): 26 Temperature(F): Respiratory Rate 16 (breaths/min): Photos: [1:No Photos] [N/A:N/A] Wound Location: [1:Left Malleolus - Lateral] [N/A:N/A] Wounding Event: [1:Pressure Injury] [N/A:N/A] Primary Etiology: [1:Pressure Ulcer] [N/A:N/A] Comorbid History: [1:Cataracts, Anemia, Sleep Apnea, Arrhythmia, Hypertension, Dementia] [N/A:N/A] Date Acquired: [1:05/01/2016] [N/A:N/A] Weeks of Treatment: [1:1] [N/A:N/A] Wound Status: [1:Open] [N/A:N/A] Measurements L x W x D 0.5x0.5x0.1 [N/A:N/A] (cm) Area (cm) : [1:0.196] [N/A:N/A] Volume (cm) : [1:0.02] [N/A:N/A] % Reduction in Area: [1:0.00%] [N/A:N/A] % Reduction in Volume: 0.00% [N/A:N/A] Classification: [1:Category/Stage II] [N/A:N/A] Exudate Amount: [1:Large] [N/A:N/A] Exudate Type: [1:Serosanguineous] [N/A:N/A] Exudate Color: [1:red, brown] [N/A:N/A] Wound Margin: [1:Distinct, outline attached] [N/A:N/A] Granulation Amount: [1:None Present (0%)] [N/A:N/A] Necrotic  Amount: [1:Large (67-100%)] [N/A:N/A] Necrotic Tissue: [1:Eschar] [N/A:N/A] Epithelialization: [1:None] [N/A:N/A] Debridement: [1:Debridement [id number] [N/A:N/A] Pre-procedure [1:11:18] [N/A:N/A] Verification/Time Out Taken: SHERRILL, TESCHENDORF ([id number]) Pain Control: Lidocaine 4% Topical N/A N/A Solution Tissue Debrided: Fibrin/Slough, Exudates, N/A N/A Subcutaneous Level: Skin/Subcutaneous N/A N/A Tissue Debridement Area (sq 0.25 N/A N/A cm): Instrument: Curette N/A N/A Bleeding: Minimum N/A N/A Hemostasis Achieved: Pressure N/A N/A Procedural Pain: 0 N/A N/A Post Procedural Pain: 0 N/A N/A Debridement Treatment Procedure was tolerated N/A N/A Response: well Post Debridement 0.5x0.5x0.1 N/A N/A Measurements L x W x D (cm) Post Debridement 0.02 N/A N/A Volume: (cm) Post Debridement Category/Stage II N/A N/A Stage: Periwound Skin Texture: No Abnormalities Noted N/A N/A Periwound Skin No  Abnormalities Noted N/A N/A Moisture: Periwound Skin Color: Erythema: Yes N/A N/A Erythema Location: Circumferential N/A N/A Temperature: No Abnormality N/A N/A Tenderness on Yes N/A N/A Palpation: Wound Preparation: Ulcer Cleansing: N/A N/A Rinsed/Irrigated with Saline Topical Anesthetic Applied: Other: lidocaine 4% Procedures Performed: Debridement N/A N/A Treatment Notes Wound #1 (Left, Lateral Malleolus) 1. Cleansed with: Clean wound with Normal Saline 2. Anesthetic Topical Lidocaine 4% cream to wound bed prior to debridement 3. Peri-wound Care: Skin Prep 4. Dressing Applied: PEIRCE, VERHELST ([id number]) Medihoney Gel 5. Secondary Dressing Applied Bordered Foam Dressing Dry Gauze Electronic Signature(s) Signed: 06/05/2016 11:25:40 AM By: Christin Fudge MD, FACS Entered By: Christin Fudge on 06/05/2016 11:25:40 [name] ([id number]) -------------------------------------------------------------------------------- Multi-Disciplinary Care Plan Details Patient Name: NICKO, GILBERTS. Date of Service: 06/05/2016 11:00 AM Medical Record Number: [id number] Patient Account Number: [id number] Date of Birth/Sex: 09-Jul-1934 (82 y.o. Male) Treating RN: Ahmed Prima Primary Care Cordaryl Decelles: [name] Other Clinician: Referring Ernestina Joe: [name] Treating Zoee Heeney/Extender: [name] in Treatment: 1 Active Inactive ` Abuse / Safety / Falls / Self Care Management Nursing Diagnoses: Potential for falls Goals: Patient will remain injury free Date Initiated: 05/29/2016 Target Resolution Date: 08/02/2016 Goal Status: Active Interventions: Assess fall risk on admission and as needed Assess impairment of mobility on admission and as needed per policy Notes: ` Nutrition Nursing Diagnoses: Imbalanced nutrition Goals: Patient/caregiver agrees to and verbalizes understanding of need to use nutritional supplements and/or vitamins as prescribed Date  Initiated: 05/29/2016 Target Resolution Date: 08/02/2016 Goal Status: Active Interventions: Assess patient nutrition upon admission and as needed per policy Notes: ` Orientation to the Wound Care Program Nursing Diagnoses: TEHRON, LAVER ([id number]) Knowledge deficit related to the wound healing center program Goals: Patient/caregiver will verbalize understanding of the [facility] Date Initiated: 05/29/2016 Target Resolution Date: 06/14/2016 Goal Status: Active Interventions: Provide education on orientation to the wound center Notes: ` Pain, Acute or Chronic Nursing Diagnoses: Pain, acute or chronic: actual or potential Potential alteration in comfort, pain Goals: Patient will verbalize adequate pain control and receive pain control interventions during procedures as needed Date Initiated: 05/29/2016 Target Resolution Date: 08/02/2016 Goal Status: Active Interventions: Complete pain assessment as per visit requirements Notes: ` Wound/Skin Impairment Nursing Diagnoses: Impaired tissue integrity Knowledge deficit related to ulceration/compromised skin integrity Goals: Ulcer/skin breakdown will have a volume reduction of 80% by week 12 Date Initiated: 05/29/2016 Target Resolution Date: 08/02/2016 Goal Status: Active Interventions: Assess patient/caregiver ability to perform ulcer/skin care regimen upon admission and as needed Assess ulceration(s) every visit Notes: TOMASZ, FORNOFF ([id number]) Electronic Signature(s) Signed: 06/05/2016 2:38:46 PM By: Alric Quan Entered By: Alric Quan on 06/05/2016 11:10:45 [name] ([id number]) -------------------------------------------------------------------------------- Pain Assessment Details Patient Name: [name]. Date of Service:  06/05/2016 11:00 AM Medical Record Number: [id number] Patient Account Number: [id number] Date of Birth/Sex: 08/17/1934 (82 y.o. Male) Treating RN: Ahmed Prima Primary Care Nevae Pinnix: [name] Other Clinician: Referring Meshell Abdulaziz: [name] Treating Sayan Aldava/Extender: [name] in Treatment: 1 Active Problems Location of Pain Severity and Description of Pain Patient Has Paino No Site Locations With Dressing Change: No Pain Management and Medication Current Pain Management: Electronic Signature(s) Signed: 06/05/2016 2:38:46 PM By: Alric Quan Entered By: Alric Quan on 06/05/2016 11:00:37 [name] ([id number]) -------------------------------------------------------------------------------- Patient/Caregiver Education Details Patient Name: LORRIE, VANCE. Date of Service: 06/05/2016 11:00 AM Medical Record Number: [id number] Patient Account Number: [id number] Date of Birth/Gender: 1934-06-01 (82 y.o. Male) Treating RN: Ahmed Prima Primary Care Physician: [name] Other Clinician: Referring Physician: [name] Treating Physician/Extender: [name] in Treatment: 1 Education Assessment Education Provided To: Patient Education Topics Provided Wound/Skin Impairment: Handouts: Other: change dressing as ordered Methods: Demonstration, Explain/Verbal Responses: State content correctly Electronic Signature(s) Signed: 06/05/2016 2:38:46 PM By: Alric Quan Entered By: Alric Quan on 06/05/2016 11:11:21 [name] ([id number]) -------------------------------------------------------------------------------- Wound Assessment Details Patient Name: GABRAEL, MARES. Date of Service: 06/05/2016 11:00 AM Medical Record Number: [id number] Patient Account Number: [id number] Date of Birth/Sex: 1934-11-15 (82 y.o. Male) Treating RN: Ahmed Prima Primary Care Sahana Boyland: [name] Other Clinician: Referring Veto Macqueen: [name] Treating Tayten Bergdoll/Extender: [name] in Treatment: 1 Wound Status Wound Number: 1 Primary Pressure  Ulcer Etiology: Wound Location: Left Malleolus - Lateral Wound Open Wounding Event: Pressure Injury Status: Date Acquired: 05/01/2016 Comorbid Cataracts, Anemia, Sleep Apnea, Weeks Of Treatment: 1 History: Arrhythmia, Hypertension, Dementia Clustered Wound: No Photos Photo Uploaded By: Alric Quan on 06/05/2016 11:30:58 Wound Measurements Length: (cm) 0.5 Width: (cm) 0.5 Depth: (cm) 0.1 Area: (cm) 0.196 Volume: (cm) 0.02 % Reduction in Area: 0% % Reduction in Volume: 0% Epithelialization: None Tunneling: No Undermining: No Wound Description Classification: Category/Stage II Foul Odor Afte Wound Margin: Distinct, outline attached Slough/Fibrino Exudate Amount: Large Exudate Type: Serosanguineous Exudate Color: red, brown r Cleansing: No No Wound Bed Granulation Amount: None Present (0%) Necrotic Amount: Large (67-100%) Necrotic Quality: Eschar Periwound Skin Texture MIKE, DAFT. ([id number]) Texture Color No Abnormalities Noted: No No Abnormalities Noted: No Erythema: Yes Moisture Erythema Location: Circumferential No Abnormalities Noted: No Temperature / Pain Temperature: No Abnormality Tenderness on Palpation: Yes Wound Preparation Ulcer Cleansing: Rinsed/Irrigated with Saline Topical Anesthetic Applied: Other: lidocaine 4%, Treatment Notes Wound #1 (Left, Lateral Malleolus) 1. Cleansed with: Clean wound with Normal Saline 2. Anesthetic Topical Lidocaine 4% cream to wound bed prior to debridement 3. Peri-wound Care: Skin Prep 4. Dressing Applied: Medihoney Gel 5. Secondary Dressing Applied Bordered Foam Dressing Dry Gauze Electronic Signature(s) Signed: 06/05/2016 2:38:46 PM By: Alric Quan Entered By: Alric Quan on 06/05/2016 11:11:47 [name] ([id number]) -------------------------------------------------------------------------------- [place] Details Patient Name: BOWAN, FOWLE. Date of Service: 06/05/2016 11:00  AM Medical Record Number: [id number] Patient Account Number: [id number] Date of Birth/Sex: 1934/06/17 (82 y.o. Male) Treating RN: Ahmed Prima Primary Care Liliana Dang: [name] Other Clinician: Referring Asmaa Tirpak: [name] Treating Daris Aristizabal/Extender: [name] in Treatment: 1 Vital Signs Time Taken: 11:00 Pulse (bpm): 55 Height (in): 69 Respiratory Rate (breaths/min): 16 Weight (lbs): 174.8 Blood Pressure (mmHg): 151/56 Body Mass Index (BMI): 25.8 Reference Range: 80 - 120 mg / dl Electronic Signature(s) Signed: 06/05/2016 2:38:46 PM By: Alric Quan Entered By: Alric Quan on 06/05/2016 11:02:53

## 2016-06-06 NOTE — Progress Notes (Addendum)
KEIZER, STIGEN ([id number]) Visit Report for 06/05/2016 Chief Complaint Document Details Patient Name: [name], [name]. Date of Service: 06/05/2016 11:00 AM Medical Record Number: [id number] Patient Account Number: [id number] Date of Birth/Sex: 24-Jan-1935 (82 y.o. Male) Treating RN: Ahmed Prima Primary Care Provider: [name] Other Clinician: Referring Provider: [name] Treating Provider/Extender: [name] in Treatment: 1 Information Obtained from: Patient Chief Complaint Patient is at the clinic for treatment of an open pressure ulcer to the left ankle which she's had for about 5 weeks Electronic Signature(s) Signed: 06/05/2016 11:25:56 AM By: Christin Fudge MD, FACS Entered By: Christin Fudge on 06/05/2016 11:25:56 [name] ([id number]) -------------------------------------------------------------------------------- Debridement Details Patient Name: [name]. Date of Service: 06/05/2016 11:00 AM Medical Record Number: [id number] Patient Account Number: [id number] Date of Birth/Sex: 12-20-1934 (82 y.o. Male) Treating RN: Ahmed Prima Primary Care Provider: [name] Other Clinician: Referring Provider: [name] Treating Provider/Extender: [name] in Treatment: 1 Debridement Performed for Wound #1 Left,Lateral Malleolus Assessment: Performed By: Physician Christin Fudge, MD Debridement: Debridement Pre-procedure Yes - 11:18 Verification/Time Out Taken: Start Time: 11:19 Pain Control: Lidocaine 4% Topical Solution Level: Skin/Subcutaneous Tissue Total Area Debrided (L x 0.5 (cm) x 0.5 (cm) = 0.25 (cm) W): Tissue and other Viable, Non-Viable, Exudate, Fibrin/Slough, Subcutaneous material debrided: Instrument: Curette Bleeding: Minimum Hemostasis Achieved: Pressure End Time: 11:20 Procedural Pain: 0 Post Procedural Pain: 0 Response to Treatment: Procedure was tolerated well Post Debridement  Measurements of Total Wound Length: (cm) 0.5 Stage: Category/Stage II Width: (cm) 0.5 Depth: (cm) 0.1 Volume: (cm) 0.02 Character of Wound/Ulcer Post Requires Further Debridement: Debridement Severity of Tissue Post Fat layer exposed Debridement: Post Procedure Diagnosis Same as Pre-procedure Electronic Signature(s) Signed: 06/05/2016 11:25:51 AM By: Christin Fudge MD, FACS Signed: 06/05/2016 2:38:46 PM By: Alric Quan Entered By: Christin Fudge on 06/05/2016 11:25:51 MAYANK, BARDER ([id number]) ESHAWN, OHAGAN. ([id number]) -------------------------------------------------------------------------------- HPI Details Patient Name: [name]. Date of Service: 06/05/2016 11:00 AM Medical Record Number: [id number] Patient Account Number: [id number] Date of Birth/Sex: [birth year] (82 y.o. Male) Treating RN: Ahmed Prima Primary Care Provider: [name] Other Clinician: Referring Provider: [name] Treating Provider/Extender: [name] in Treatment: 1 History of Present Illness Location: left ankle laterally Quality: Patient reports experiencing a dull pain to affected area(s). Severity: Patient states wound are getting worse. Duration: Patient has had the wound for < 5 weeks prior to presenting for treatment Timing: Pain in wound is Intermittent (comes and goes Context: The wound appeared gradually over time Modifying Factors: Other treatment(s) tried include:put on Bactroban and doxycycline Associated Signs and Symptoms: Patient reports having difficulty standing for long periods. HPI Description: 82 year old gentleman who is known to have a history of dementia but is fairly functional and is able to ambulate has been having a pressure ulcer to the left ankle due to laying in bed for longer than he should. Past history of atrial fibrillation, dementia, hyperlipidemia, hypertension. He's also had some orthopedic related surgeries in the past and  was a former smoker and is given up in 1969. Recently seen by his PCP who put him on doxycycline and asked him to apply Bactroban and see [place] at the wound clinic. 06/05/2016 -- had a x-ray of the left ankle which showed no acute bony pathology. Electronic Signature(s) Signed: 06/05/2016 11:26:03 AM By: Christin Fudge MD, FACS Previous Signature: 06/05/2016 11:16:01 AM Version By: Christin Fudge MD, FACS Entered By: Christin Fudge on 06/05/2016 11:26:02 Lawrance,  [name] ([id number]) -------------------------------------------------------------------------------- Physical Exam Details Patient Name: [name]. Date of Service: 06/05/2016 11:00 AM Medical Record Number: [id number] Patient Account Number: [id number] Date of Birth/Sex: 1934/12/27 (82 y.o. Male) Treating RN: Ahmed Prima Primary Care Provider: [name] Other Clinician: Referring Provider: [name] Treating Provider/Extender: [name] in Treatment: 1 Constitutional . Pulse regular. Respirations normal and unlabored. Afebrile. . Eyes Nonicteric. Reactive to light. Ears, Nose, Mouth, and Throat Lips, teeth, and gums WNL.Marland Kitchen Moist mucosa without lesions. Neck supple and nontender. No palpable supraclavicular or cervical adenopathy. Normal sized without goiter. Respiratory WNL. No retractions.. Breath sounds WNL, No rubs, rales, rhonchi, or wheeze.. Cardiovascular Heart rhythm and rate regular, no murmur or gallop.. Pedal Pulses WNL. No clubbing, cyanosis or edema. Chest Breasts symmetical and no nipple discharge.. Breast tissue WNL, no masses, lumps, or tenderness.. Lymphatic No adneopathy. No adenopathy. No adenopathy. Musculoskeletal Adexa without tenderness or enlargement.. Digits and nails w/o clubbing, cyanosis, infection, petechiae, ischemia, or inflammatory conditions.. Integumentary (Hair, Skin) No suspicious lesions. No crepitus or fluctuance. No peri-wound warmth or erythema. No  masses.Marland Kitchen Psychiatric Judgement and insight Intact.. No evidence of depression, anxiety, or agitation.. Notes the ulcerated area on the left lateral ankle had some subcutaneous debris which I sharply debrided with a #3 curet and bleeding controlled with pressure Electronic Signature(s) Signed: 06/05/2016 11:26:28 AM By: Christin Fudge MD, FACS Entered By: Christin Fudge on 06/05/2016 11:26:27 [name] ([id number]) -------------------------------------------------------------------------------- Physician Orders Details Patient Name: KENDEN, FREDE. Date of Service: 06/05/2016 11:00 AM Medical Record Number: [id number] Patient Account Number: [id number] Date of Birth/Sex: [birth year] (82 y.o. Male) Treating RN: Ahmed Prima Primary Care Provider: [name] Other Clinician: Referring Provider: [name] Treating Provider/Extender: [name] in Treatment: 1 Verbal / Phone Orders: Yes Clinician: Pinkerton, Debi Read Back and Verified: Yes Diagnosis Coding Wound Cleansing Wound #1 Left,Lateral Malleolus o Clean wound with Normal Saline. o May Shower, gently pat wound dry prior to applying new dressing. Anesthetic Wound #1 Left,Lateral Malleolus o Topical Lidocaine 4% cream applied to wound bed prior to debridement - for clinic use Skin Barriers/Peri-Wound Care Wound #1 Left,Lateral Malleolus o Skin Prep Primary Wound Dressing Wound #1 Left,Lateral Malleolus o Medihoney gel Secondary Dressing Wound #1 Left,Lateral Malleolus o Dry Gauze o Boardered Foam Dressing Follow-up Appointments Wound #1 Left,Lateral Malleolus o Return Appointment in 1 week. Edema Control Wound #1 Left,Lateral Malleolus o Elevate legs to the level of the heart and pump ankles as often as possible Off-Loading Wound #1 Left,Lateral Malleolus o Turn and reposition every 2 hours o Other: - keep pressure off of affected area BRYCEN, SALSEDO.  ([id number]) Additional Orders / Instructions Wound #1 Left,Lateral Malleolus o Increase protein intake. Medications-please add to medication list. Wound #1 Left,Lateral Malleolus o Other: - Vitamin A, Vitamin C, Zinc, MVI Electronic Signature(s) Signed: 06/05/2016 2:38:46 PM By: Alric Quan Signed: 06/05/2016 3:11:09 PM By: Christin Fudge MD, FACS Entered By: Alric Quan on 06/05/2016 11:20:58 [name] ([id number]) -------------------------------------------------------------------------------- Problem List Details Patient Name: ATTHEW, MUNDINGER. Date of Service: 06/05/2016 11:00 AM Medical Record Number: [id number] Patient Account Number: [id number] Date of Birth/Sex: [birth year] (82 y.o. Male) Treating RN: Ahmed Prima Primary Care Provider: [name] Other Clinician: Referring Provider: [name] Treating Provider/Extender: [name] in Treatment: 1 Active Problems ICD-10 Encounter Code Description Active Date Diagnosis [id number] Pressure ulcer of left ankle, stage 2 05/29/2016 Yes F02.80 Dementia in other diseases classified elsewhere without 05/29/2016  Yes behavioral disturbance Inactive Problems Resolved Problems Electronic Signature(s) Signed: 06/05/2016 11:25:36 AM By: Christin Fudge MD, FACS Entered By: Christin Fudge on 06/05/2016 11:25:36 [name] ([id number]) -------------------------------------------------------------------------------- Progress Note Details Patient Name: BLAINE, MCCALLA. Date of Service: 06/05/2016 11:00 AM Medical Record Number: [id number] Patient Account Number: [id number] Date of Birth/Sex: 06-25-1934 (82 y.o. Male) Treating RN: Ahmed Prima Primary Care Provider: [name] Other Clinician: Referring Provider: [name] Treating Provider/Extender: [name] in Treatment: 1 Subjective Chief Complaint Information obtained from Patient Patient is at the clinic for  treatment of an open pressure ulcer to the left ankle which she's had for about 5 weeks History of Present Illness (HPI) The following HPI elements were documented for the patient's wound: Location: left ankle laterally Quality: Patient reports experiencing a dull pain to affected area(s). Severity: Patient states wound are getting worse. Duration: Patient has had the wound for < 5 weeks prior to presenting for treatment Timing: Pain in wound is Intermittent (comes and goes Context: The wound appeared gradually over time Modifying Factors: Other treatment(s) tried include:put on Bactroban and doxycycline Associated Signs and Symptoms: Patient reports having difficulty standing for long periods. 82 year old gentleman who is known to have a history of dementia but is fairly functional and is able to ambulate has been having a pressure ulcer to the left ankle due to laying in bed for longer than he should. Past history of atrial fibrillation, dementia, hyperlipidemia, hypertension. He's also had some orthopedic related surgeries in the past and was a former smoker and is given up in 1969. Recently seen by his PCP who put him on doxycycline and asked him to apply Bactroban and see [place] at the wound clinic. 06/05/2016 -- had a x-ray of the left ankle which showed no acute bony pathology. Objective Constitutional Pulse regular. Respirations normal and unlabored. Afebrile. Vitals Time Taken: 11:00 AM, Height: 69 in, Weight: 174.8 lbs, BMI: 25.8, Pulse: 55 bpm, Respiratory Rate: 16 breaths/min, Blood Pressure: 151/56 mmHg. ORLEY, [id number] ([id number]) Eyes Nonicteric. Reactive to light. Ears, Nose, Mouth, and Throat Lips, teeth, and gums WNL.Marland Kitchen Moist mucosa without lesions. Neck supple and nontender. No palpable supraclavicular or cervical adenopathy. Normal sized without goiter. Respiratory WNL. No retractions.. Breath sounds WNL, No rubs, rales, rhonchi, or wheeze.. Cardiovascular Heart  rhythm and rate regular, no murmur or gallop.. Pedal Pulses WNL. No clubbing, cyanosis or edema. Chest Breasts symmetical and no nipple discharge.. Breast tissue WNL, no masses, lumps, or tenderness.. Lymphatic No adneopathy. No adenopathy. No adenopathy. Musculoskeletal Adexa without tenderness or enlargement.. Digits and nails w/o clubbing, cyanosis, infection, petechiae, ischemia, or inflammatory conditions.Marland Kitchen Psychiatric Judgement and insight Intact.. No evidence of depression, anxiety, or agitation.. General Notes: the ulcerated area on the left lateral ankle had some subcutaneous debris which I sharply debrided with a #3 curet and bleeding controlled with pressure Integumentary (Hair, Skin) No suspicious lesions. No crepitus or fluctuance. No peri-wound warmth or erythema. No masses.. Wound #1 status is Open. Original cause of wound was Pressure Injury. The wound is located on the Left,Lateral Malleolus. The wound measures 0.5cm length x 0.5cm width x 0.1cm depth; 0.196cm^2 area and 0.02cm^3 volume. There is no tunneling or undermining noted. There is a large amount of serosanguineous drainage noted. The wound margin is distinct with the outline attached to the wound base. There is no granulation within the wound bed. There is a large (67-100%) amount of necrotic tissue within the wound bed including Eschar. The periwound skin  appearance exhibited: Erythema. The surrounding wound skin color is noted with erythema which is circumferential. Periwound temperature was noted as No Abnormality. The periwound has tenderness on palpation. Assessment NEIL, SOUN ([id number]) Active Problems ICD-10 [phone] - Pressure ulcer of left ankle, stage 2 F02.80 - Dementia in other diseases classified elsewhere without behavioral disturbance Procedures Wound #1 Wound #1 is a Pressure Ulcer located on the Left,Lateral Malleolus . There was a Skin/Subcutaneous Tissue Debridement [id number])  debridement with total area of 0.25 sq cm performed by Christin Fudge, MD. with the following instrument(s): Curette to remove Viable and Non-Viable tissue/material including Exudate, Fibrin/Slough, and Subcutaneous after achieving pain control using Lidocaine 4% Topical Solution. A time out was conducted at 11:18, prior to the start of the procedure. A Minimum amount of bleeding was controlled with Pressure. The procedure was tolerated well with a pain level of 0 throughout and a pain level of 0 following the procedure. Post Debridement Measurements: 0.5cm length x 0.5cm width x 0.1cm depth; 0.02cm^3 volume. Post debridement Stage noted as Category/Stage II. Character of Wound/Ulcer Post Debridement requires further debridement. Severity of Tissue Post Debridement is: Fat layer exposed. Post procedure Diagnosis Wound #1: Same as Pre-Procedure Plan Wound Cleansing: Wound #1 Left,Lateral Malleolus: Clean wound with Normal Saline. May Shower, gently pat wound dry prior to applying new dressing. Anesthetic: Wound #1 Left,Lateral Malleolus: Topical Lidocaine 4% cream applied to wound bed prior to debridement - for clinic use Skin Barriers/Peri-Wound Care: Wound #1 Left,Lateral Malleolus: Skin Prep Primary Wound Dressing: Wound #1 Left,Lateral Malleolus: Medihoney gel Secondary Dressing: Wound #1 Left,Lateral Malleolus: Dry Gauze Boardered Foam Dressing REMOND, DOUGHTON ([id number]) Follow-up Appointments: Wound #1 Left,Lateral Malleolus: Return Appointment in 1 week. Edema Control: Wound #1 Left,Lateral Malleolus: Elevate legs to the level of the heart and pump ankles as often as possible Off-Loading: Wound #1 Left,Lateral Malleolus: Turn and reposition every 2 hours Other: - keep pressure off of affected area Additional Orders / Instructions: Wound #1 Left,Lateral Malleolus: Increase protein intake. Medications-please add to medication list.: Wound #1 Left,Lateral  Malleolus: Other: - Vitamin A, Vitamin C, Zinc, MVI I have recommended: 1. Medihoney to be applied daily, and a protective bordered foam 2. Off-Loading has been discussed in great detail 3. adequate protein, vitamin E, vitamin C and zinc 4. Regular visits to the wound center 5. x-ray done and reviewed with them and discussed the normal bone Electronic Signature(s) Signed: 06/05/2016 11:27:44 AM By: Christin Fudge MD, FACS Entered By: Christin Fudge on 06/05/2016 11:27:43 [name] ([id number]) -------------------------------------------------------------------------------- SuperBill Details Patient Name: SHAKEL, CASIAS. Date of Service: 06/05/2016 Medical Record Number: [id number] Patient Account Number: [id number] Date of Birth/Sex: 18-Sep-1934 (82 y.o. Male) Treating RN: Ahmed Prima Primary Care Provider: [name] Other Clinician: Referring Provider: [name] Treating Provider/Extender: [name] in Treatment: 1 Diagnosis Coding ICD-10 Codes Code Description [phone] Pressure ulcer of left ankle, stage 2 F02.80 Dementia in other diseases classified elsewhere without behavioral disturbance Facility Procedures CPT4: Description Modifier Quantity Code [id number] - DEB SUBQ TISSUE 20 SQ CM/< 1 ICD-10 Description Diagnosis L89.522 Pressure ulcer of left ankle, stage 2 F02.80 Dementia in other diseases classified elsewhere without behavioral disturbance Physician Procedures CPT4: Description Modifier Quantity Code [id number] - WC PHYS LEVEL 3 - EST PT 25 1 ICD-10 Description Diagnosis L89.522 Pressure ulcer of left ankle, stage 2 F02.80 Dementia in other diseases classified elsewhere without behavioral disturbance CPT4: [id number] - WC PHYS SUBQ TISS 20 SQ CM 1  ICD-10 Description Diagnosis L89.522 Pressure ulcer of left ankle, stage 2 F02.80 Dementia in other diseases classified elsewhere without behavioral disturbance Electronic  Signature(s) Signed: 06/05/2016 11:27:59 AM By: Christin Fudge MD, FACS DENSIL, LARE ([id number]) Entered By: Christin Fudge on 06/05/2016 11:27:58

## 2016-06-12 ENCOUNTER — Ambulatory Visit: Payer: PPO | Admitting: Physical Therapy

## 2016-06-12 ENCOUNTER — Encounter: Payer: PPO | Admitting: Surgery

## 2016-06-12 ENCOUNTER — Ambulatory Visit: Payer: PPO | Attending: Neurology | Admitting: Physical Therapy

## 2016-06-12 DIAGNOSIS — R293 Abnormal posture: Secondary | ICD-10-CM | POA: Diagnosis not present

## 2016-06-12 DIAGNOSIS — R296 Repeated falls: Secondary | ICD-10-CM | POA: Insufficient documentation

## 2016-06-12 DIAGNOSIS — M6281 Muscle weakness (generalized): Secondary | ICD-10-CM | POA: Insufficient documentation

## 2016-06-12 DIAGNOSIS — R2689 Other abnormalities of gait and mobility: Secondary | ICD-10-CM | POA: Diagnosis not present

## 2016-06-12 DIAGNOSIS — L89522 Pressure ulcer of left ankle, stage 2: Secondary | ICD-10-CM | POA: Diagnosis not present

## 2016-06-12 NOTE — Therapy (Signed)
[facility] [address] [place], [place], [address] Phone: [phone]   Fax:  [phone]  Physical Therapy Evaluation  Patient Details  Name: [name] MRN: [id number] Date of Birth: 1934-10-20 Referring Provider: Jennings Books, MD  Encounter Date: 06/12/2016      PT End of Session - 06/12/16 1756    Visit Number 1   Number of Visits 8   Date for PT Re-Evaluation 07/10/16   Authorization - Visit Number 1   Authorization - Number of Visits 10   PT Start Time [id number]   PT Stop Time 1556   PT Time Calculation (min) 87 min   Equipment Utilized During Treatment Gait belt   Activity Tolerance Patient tolerated treatment well   Behavior During Therapy [facility] for tasks assessed/performed      Past Medical History:  Diagnosis Date  . Absolute anemia 09/11/2014  . Anxiety   . Atrial fibrillation ([place]) 01/23/2014  . BP (high blood pressure) 11/07/2013  . Carotid artery disease ([place]) 09/11/2014  . Complete rotator cuff rupture of left shoulder 12/30/2013  . Dementia 09/11/2014  . Depression   . Heart murmur   . HLD (hyperlipidemia) 01/23/2014  . Hypertension   . Obstructive apnea 11/07/2013  . Personal history of other diseases of the circulatory system 01/23/2014  . Sleep apnea   . 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 Back Surgery- Tumor Removal    . SHOULDER SURGERY Right 2007    There were no vitals filed for this visit.       Subjective Assessment - 06/12/16 1740    Subjective Patient is a pleasant 82 year old male who presented to his physical therapy evaluation for instability and falls. Patient's wife attended session with patient and assisted in history. The patient fell 4  ft last October when he fell off a stage and was hospitalized for 9 days and then stayed at [facility] for 20 days where they state he did well and was walking well. Upon return home the  patient stopped ambulating as frequently and started declining as per wife's report. The patient reports having an old injury to his left knee, to his shoulder, and has occasional low back pain that he is seeing an orthopedist for tomorrow 2/9. The patient is currently receiving care for his ulcer on his left ankle that occurred from his frequent time in bed. Patient has a cane and walker but does not like using it.   Patient is accompained by: Family member   Pertinent History Hx of left knee pain, low back pain, shoulder pain. Hx of falls, dementia, and teretoma of the spine   Limitations Sitting;Lifting;Standing;Walking;House hold activities   Diagnostic tests BERG   Patient Stated Goals get stronger, pick up legs better when walking, getting up easier and faster   Currently in Pain? No/denies            Right Left  Hip flexion 4/5 3+/5  Hip Abduction 4/5 4/5  Hip Adduction 4-/5 3/5  Knee Extension  4/5 4/5  Knee Flexion 3+/5 4-/5  DF 3/5 3-/5  PF 3+/5 3-/5       Right Left  Upper trap 4/5 4/5  Biceps 4/5 4/5  Triceps 4/5 4/5  Wrist Ext 4-/5 4-/5  Wrist Flexion 4/5 4-/5  Finger Abd 4+/5 4+/5  Finger Add 4+/5 4+/5    Transfers:  STS from chair: can perform I with no UE use, when fatigued has to use multiple trials in addition to bilateral UE use. When fatigued will sit down at an angle nearly missing chair.  SPT chair to chair: Independent when not fatigued EOB-->supine: Min A for LE Supine --> EOB. CGA- Mod A depending on fatigue.   Straight leg raise: limited bilaterally L: 64 degrees, R 59 degrees  PROM Dorsiflexion limited bilaterally:  L 0 degrees, R: 2 degrees  Plantarflexion: L: 50 degrees, R: 61 degrees  PROM Shoulder flexion: Left: 165 degrees, Right 167 degrees  PROM: Shoulder Abduction: Left: 105 degrees with pain, Right: WFL  Posture:  Trunk flexion, BLE externally rotated (toes out), knees bent, head down, rounded shoulders. Able to correct upon  cueing with noted tightness in bilateral hip flexors  Gait: Shuffling with scuffing of shoes, inadequate hip and knee extension, inadequate heel strike, absence of trunk rotation and UE swing, excessive trunk flexion.  Integ: Pressure ulcer on left malleolus  Capacity for functional activity: Nustep 8 minutes Lvl4: pt fatigued after completion Fatigued quickly throughout session.  HEP education and demonstration        PT Education - 06/12/16 1755    Education provided Yes   Education Details HEP   Person(s) Educated Patient;Spouse   Methods Explanation;Demonstration;Handout   Comprehension Verbalized understanding;Returned demonstration             PT Long Term Goals - 06/12/16 1815      PT LONG TERM GOAL #1   Title Patient will increase score on Berg Balance to 49/56 for decreased fall risk.    Baseline 2/8: 44/56   Time 4   Period Weeks   Status New     PT LONG TERM GOAL #2   Title Patient will have gross 4/5 bilateral LE strength to increase ability to perform functional activities.    Baseline Patient has a gross LLE 3+/5, RLE 4-/5   Time 4   Period Weeks   Status New     PT LONG TERM GOAL #3   Title Patient will ambulate 5 minutes with a cane without LOB to increase community mobility.     Baseline Patient fatigues quickly.    Time 4   Period Weeks   Status New     PT LONG TERM GOAL #4   Title Patient will perform 10 sit to stands without use of hands or LOB to demonstrate improved capacity for functional activities.    Baseline Patient can perform 1 sit to stand without hands.    Time 4   Period Weeks   Status New             Plan - 06/12/16 1758    Clinical Impression Statement Patient is a pleasant 82 year old male who presented to his physical therapy evaluation for instability and falls.  The patient has global weakness with LLE weaker than RLE, decreased ROM,  decreased capacity for functional activities, abnormal posture, poor  balance, and gait deviations.  Patient performed the [facility] Assessment and scored a 44/56 placing him in the significant fall risk category emphasizing use of cane indoors. Patient has significant forward trunk flexion in standing but is able to correct upon cueing.  The patient has shuffling gait with noted scuffing of shoes, inadequate knee and hip extension, inadequate heel strike,  absent UE swing and rotation, and excessive forward trunk lean which is affected by his limited ROM and weak bilateral LE's.  The patient has difficulty transferring when fatigued often having to use multiple attempts. Frequent LOB were noted with the patient with frequency increasing as fatigue increased. The patient was given an HEP program and he and his wife were educated on how to perform it with the patient demonstrating understanding. The patient would benefit from skilled physical therapy for improved balance, decreased fall risk, global strengthening, increased ROM, gross motor skills, and ambulatory training.    Rehab Potential Fair   Clinical Impairments Affecting Rehab Potential hx of falls, dementia, teratoma, mitral valve polapse, cancer, a fib, L knee injury, L shoulder, back pain   PT Frequency 2x / week   PT Duration 4 weeks   PT Treatment/Interventions ADLs/Self Care Home Management;Aquatic Therapy;Cryotherapy;Electrical Stimulation;Biofeedback;Moist Heat;DME Instruction;Gait training;Stair training;Functional mobility training;Therapeutic activities;Therapeutic exercise;Balance training;Neuromuscular re-education;Patient/family education;Orthotic Fit/Training;Manual techniques;Passive range of motion;Dry needling;Energy conservation   PT Next Visit Plan review HEP, BALANCE, CANE EDUCATION   PT Home Exercise Plan see sheet   Consulted and Agree with Plan of Care Patient;Family member/caregiver   Family Member Consulted wife      Patient will benefit from skilled therapeutic intervention in order to  improve the following deficits and impairments:  Abnormal gait, Decreased activity tolerance, Decreased balance, Decreased mobility, Decreased knowledge of use of DME, Decreased endurance, Decreased range of motion, Decreased safety awareness, Decreased skin integrity, Decreased knowledge of precautions, Decreased strength, Hypomobility, Difficulty walking, Impaired flexibility, Postural dysfunction, Improper body mechanics, Decreased cognition  Visit Diagnosis: Muscle weakness (generalized)  Other abnormalities of gait and mobility  Repeated falls  Abnormal posture      G-Codes - 2016-07-12 1822    Functional Assessment Tool Used Clinical impression/ gait difficulty/ balance issues/ Berg/ muscle weakness   Functional Limitation Mobility: Walking and moving around   Mobility: Walking and Moving Around Current Status [id number]) At least 40 percent but less than 60 percent impaired, limited or restricted   Mobility: Walking and Moving Around Goal Status [phone]) At least 1 percent but less than 20 percent impaired, limited or restricted       Problem List Patient Active Problem List   Diagnosis Date Noted  . Fall 02/18/2016  . Scalp laceration 02/18/2016  . Splenic laceration 02/18/2016  . Acute blood loss anemia 02/18/2016  . Multiple rib fractures 02/16/2016  . Mild dementia 04/10/2015  . Aggrieved 03/02/2015  . Absolute anemia 09/11/2014  . Carotid artery disease ([place]) 09/11/2014  . Dementia 09/11/2014  . History of repair of rotator cuff 03/02/2014  . Atrial fibrillation with RVR ([place]) 01/23/2014  . Personal history of other diseases of the circulatory system 01/23/2014  . HLD (hyperlipidemia) 01/23/2014  . Complete rotator cuff rupture of left shoulder 12/30/2013  . BP (high blood pressure) 11/07/2013  . Amnesia 11/07/2013  . Obstructive apnea 11/07/2013   Pura Spice, PT, DPT # [id number] Janna Arch, SPT 06/13/2016, 8:24 AM  [facility]  [facility] [address] [place], [place], [address] Phone: [phone]   Fax:  [phone]  Name: CONNARD THORN MRN: [id number] Date of Birth: [birth year]

## 2016-06-13 DIAGNOSIS — M533 Sacrococcygeal disorders, not elsewhere classified: Secondary | ICD-10-CM | POA: Diagnosis not present

## 2016-06-14 NOTE — Progress Notes (Signed)
ANIK, KOWALIK ([id number]) Visit Report for 06/12/2016 Chief Complaint Document Details Patient Name: [name], [name]. Date of Service: 06/12/2016 12:30 PM Medical Record Number: [id number] Patient Account Number: [id number] Date of Birth/Sex: [birth year] (82 y.o. Male) Treating RN: Ahmed Prima Primary Care Provider: [name] Other Clinician: Referring Provider: [name] Treating Provider/Extender: [name] in Treatment: 2 Information Obtained from: Patient Chief Complaint Patient is at the clinic for treatment of an open pressure ulcer to the left ankle which she's had for about 5 weeks Electronic Signature(s) Signed: 06/12/2016 1:27:18 PM By: Christin Fudge MD, FACS Entered By: Christin Fudge on 06/12/2016 13:27:17 [name] ([id number]) -------------------------------------------------------------------------------- Debridement Details Patient Name: [name]. Date of Service: 06/12/2016 12:30 PM Medical Record Number: [id number] Patient Account Number: [id number] Date of Birth/Sex: 1934-08-13 (82 y.o. Male) Treating RN: Ahmed Prima Primary Care Provider: [name] Other Clinician: Referring Provider: [name] Treating Provider/Extender: [name] in Treatment: 2 Debridement Performed for Wound #1 Left,Lateral Malleolus Assessment: Performed By: Physician Christin Fudge, MD Debridement: Debridement Pre-procedure Yes - 12:53 Verification/Time Out Taken: Start Time: 12:54 Pain Control: Lidocaine 4% Topical Solution Level: Skin/Subcutaneous Tissue Total Area Debrided (L x 0.5 (cm) x 0.7 (cm) = 0.35 (cm) W): Tissue and other Viable, Non-Viable, Fibrin/Slough, Subcutaneous material debrided: Instrument: Curette Bleeding: Minimum Hemostasis Achieved: Pressure End Time: 12:55 Procedural Pain: 0 Post Procedural Pain: 1 Response to Treatment: Procedure was tolerated well Post Debridement Measurements of  Total Wound Length: (cm) 0.5 Stage: Category/Stage II Width: (cm) 0.7 Depth: (cm) 0.2 Volume: (cm) 0.055 Character of Wound/Ulcer Post Requires Further Debridement: Debridement Severity of Tissue Post Fat layer exposed Debridement: Post Procedure Diagnosis Same as Pre-procedure Electronic Signature(s) Signed: 06/12/2016 1:27:12 PM By: Christin Fudge MD, FACS Signed: 06/13/2016 1:35:52 PM By: Alric Quan Entered By: Christin Fudge on 06/12/2016 13:27:12 VYRON, SHARPLEY ([id number]) DENSIL, LARE. ([id number]) -------------------------------------------------------------------------------- HPI Details Patient Name: [name]. Date of Service: 06/12/2016 12:30 PM Medical Record Number: [id number] Patient Account Number: [id number] Date of Birth/Sex: Nov 12, 1934 (82 y.o. Male) Treating RN: Ahmed Prima Primary Care Provider: [name] Other Clinician: Referring Provider: [name] Treating Provider/Extender: [name] in Treatment: 2 History of Present Illness Location: left ankle laterally Quality: Patient reports experiencing a dull pain to affected area(s). Severity: Patient states wound are getting worse. Duration: Patient has had the wound for < 5 weeks prior to presenting for treatment Timing: Pain in wound is Intermittent (comes and goes Context: The wound appeared gradually over time Modifying Factors: Other treatment(s) tried include:put on Bactroban and doxycycline Associated Signs and Symptoms: Patient reports having difficulty standing for long periods. HPI Description: 82 year old gentleman who is known to have a history of dementia but is fairly functional and is able to ambulate has been having a pressure ulcer to the left ankle due to laying in bed for longer than he should. Past history of atrial fibrillation, dementia, hyperlipidemia, hypertension. He's also had some orthopedic related surgeries in the past and was a former  smoker and is given up in 1969. Recently seen by his PCP who put him on doxycycline and asked him to apply Bactroban and see [place] at the wound clinic. 06/05/2016 -- had a x-ray of the left ankle which showed no acute bony pathology. Electronic Signature(s) Signed: 06/12/2016 1:27:23 PM By: Christin Fudge MD, FACS Entered By: Christin Fudge on 06/12/2016 13:27:23 DEVONTAY, GERSTENBERGER ([id number]) -------------------------------------------------------------------------------- Physical Exam Details Patient Name: [name].  Date of Service: 06/12/2016 12:30 PM Medical Record Number: [id number] Patient Account Number: [id number] Date of Birth/Sex: February 14, 1935 (82 y.o. Male) Treating RN: Ahmed Prima Primary Care Provider: [name] Other Clinician: Referring Provider: [name] Treating Provider/Extender: [name] in Treatment: 2 Constitutional . Pulse regular. Respirations normal and unlabored. Afebrile. . Eyes Nonicteric. Reactive to light. Ears, Nose, Mouth, and Throat Lips, teeth, and gums WNL.Marland Kitchen Moist mucosa without lesions. Neck supple and nontender. No palpable supraclavicular or cervical adenopathy. Normal sized without goiter. Respiratory WNL. No retractions.. Cardiovascular Pedal Pulses WNL. No clubbing, cyanosis or edema. Lymphatic No adneopathy. No adenopathy. No adenopathy. Musculoskeletal Adexa without tenderness or enlargement.. Digits and nails w/o clubbing, cyanosis, infection, petechiae, ischemia, or inflammatory conditions.. Integumentary (Hair, Skin) No suspicious lesions. No crepitus or fluctuance. No peri-wound warmth or erythema. No masses.Marland Kitchen Psychiatric Judgement and insight Intact.. No evidence of depression, anxiety, or agitation.. Notes the left lateral ankle had quite a bit of subcutaneous debris which I sharply removed with a #3 curet and controlled the bleeding with pressure Electronic Signature(s) Signed: 06/12/2016 1:27:54 PM By:  Christin Fudge MD, FACS Entered By: Christin Fudge on 06/12/2016 13:27:54 [name] ([id number]) -------------------------------------------------------------------------------- Physician Orders Details Patient Name: [name], [name]. Date of Service: 06/12/2016 12:30 PM Medical Record Number: [id number] Patient Account Number: [id number] Date of Birth/Sex: Aug 01, 1934 (82 y.o. Male) Treating RN: [name] Primary Care Provider: [name] Other Clinician: Referring Provider: [name] Treating Provider/Extender: [name] in Treatment: 2 Verbal / Phone Orders: No Diagnosis Coding Wound Cleansing Wound #1 Left,Lateral Malleolus o Clean wound with Normal Saline. o May Shower, gently pat wound dry prior to applying new dressing. Anesthetic Wound #1 Left,Lateral Malleolus o Topical Lidocaine 4% cream applied to wound bed prior to debridement - for clinic use Skin Barriers/Peri-Wound Care Wound #1 Left,Lateral Malleolus o Skin Prep Primary Wound Dressing Wound #1 Left,Lateral Malleolus o Medihoney gel Secondary Dressing Wound #1 Left,Lateral Malleolus o Dry Gauze o Boardered Foam Dressing Follow-up Appointments Wound #1 Left,Lateral Malleolus o Return Appointment in 1 week. Edema Control Wound #1 Left,Lateral Malleolus o Elevate legs to the level of the heart and pump ankles as often as possible Off-Loading Wound #1 Left,Lateral Malleolus o Turn and reposition every 2 hours o Other: - keep pressure off of affected area QASIM, SCHICKEL. ([id number]) Additional Orders / Instructions Wound #1 Left,Lateral Malleolus o Increase protein intake. Medications-please add to medication list. Wound #1 Left,Lateral Malleolus o Other: - Vitamin A, Vitamin C, Zinc, [employer] o Zero-G Boot Heel Protector or Print production planner) Signed: 06/12/2016 4:21:54 PM By: Christin Fudge MD, FACS Signed: 06/12/2016  6:04:53 PM By: [name], RN, [name], [name], [name] Entered By: [name], RN, [name], Kim on 06/12/2016 13:07:27 [name] ([id number]) -------------------------------------------------------------------------------- Problem List Details Patient Name: ARMONE, DRAKES. Date of Service: 06/12/2016 12:30 PM Medical Record Number: [id number] Patient Account Number: [id number] Date of Birth/Sex: Jan 28, 1935 (82 y.o. Male) Treating RN: Ahmed Prima Primary Care Provider: [name] Other Clinician: Referring Provider: [name] Treating Provider/Extender: [name] in Treatment: 2 Active Problems ICD-10 Encounter Code Description Active Date Diagnosis [phone] Pressure ulcer of left ankle, stage 2 05/29/2016 Yes F02.80 Dementia in other diseases classified elsewhere without 05/29/2016 Yes behavioral disturbance Inactive Problems Resolved Problems Electronic Signature(s) Signed: 06/12/2016 1:27:00 PM By: Christin Fudge MD, FACS Entered By: Christin Fudge on 06/12/2016 13:27:00 [name] ([id number]) -------------------------------------------------------------------------------- Progress Note Details Patient Name: Grandville Silos,  Sergi M. Date of Service: 06/12/2016 12:30 PM Medical Record Number: [id number] Patient Account Number: [id number] Date of Birth/Sex: [birth year] (82 y.o. Male) Treating RN: Ahmed Prima Primary Care Provider: [name] Other Clinician: Referring Provider: [name] Treating Provider/Extender: [name] in Treatment: 2 Subjective Chief Complaint Information obtained from Patient Patient is at the clinic for treatment of an open pressure ulcer to the left ankle which she's had for about 5 weeks History of Present Illness (HPI) The following HPI elements were documented for the patient's wound: Location: left ankle laterally Quality: Patient reports experiencing a dull pain to affected area(s). Severity: Patient states  wound are getting worse. Duration: Patient has had the wound for < 5 weeks prior to presenting for treatment Timing: Pain in wound is Intermittent (comes and goes Context: The wound appeared gradually over time Modifying Factors: Other treatment(s) tried include:put on Bactroban and doxycycline Associated Signs and Symptoms: Patient reports having difficulty standing for long periods. 82 year old gentleman who is known to have a history of dementia but is fairly functional and is able to ambulate has been having a pressure ulcer to the left ankle due to laying in bed for longer than he should. Past history of atrial fibrillation, dementia, hyperlipidemia, hypertension. He's also had some orthopedic related surgeries in the past and was a former smoker and is given up in 1969. Recently seen by his PCP who put him on doxycycline and asked him to apply Bactroban and see [place] at the wound clinic. 06/05/2016 -- had a x-ray of the left ankle which showed no acute bony pathology. Objective Constitutional Pulse regular. Respirations normal and unlabored. Afebrile. Vitals Time Taken: 12:38 PM, Height: 69 in, Weight: 174.8 lbs, BMI: 25.8, Temperature: 97.5 F, Pulse: 54 bpm, Respiratory Rate: 16 breaths/min, Blood Pressure: 139/59 mmHg. FERLIN, [id number] ([id number]) Eyes Nonicteric. Reactive to light. Ears, Nose, Mouth, and Throat Lips, teeth, and gums WNL.Marland Kitchen Moist mucosa without lesions. Neck supple and nontender. No palpable supraclavicular or cervical adenopathy. Normal sized without goiter. Respiratory WNL. No retractions.. Cardiovascular Pedal Pulses WNL. No clubbing, cyanosis or edema. Lymphatic No adneopathy. No adenopathy. No adenopathy. Musculoskeletal Adexa without tenderness or enlargement.. Digits and nails w/o clubbing, cyanosis, infection, petechiae, ischemia, or inflammatory conditions.Marland Kitchen Psychiatric Judgement and insight Intact.. No evidence of depression, anxiety, or  agitation.. General Notes: the left lateral ankle had quite a bit of subcutaneous debris which I sharply removed with a #3 curet and controlled the bleeding with pressure Integumentary (Hair, Skin) No suspicious lesions. No crepitus or fluctuance. No peri-wound warmth or erythema. No masses.. Wound #1 status is Open. Original cause of wound was Pressure Injury. The wound is located on the Left,Lateral Malleolus. The wound measures 0.5cm length x 0.7cm width x 0.1cm depth; 0.275cm^2 area and 0.027cm^3 volume. There is Fat Layer (Subcutaneous Tissue) Exposed exposed. There is a large amount of serosanguineous drainage noted. The wound margin is distinct with the outline attached to the wound base. There is medium (34-66%) pink granulation within the wound bed. There is a medium (34- 66%) amount of necrotic tissue within the wound bed including Eschar. The periwound skin appearance exhibited: Erythema. The surrounding wound skin color is noted with erythema which is circumferential. Periwound temperature was noted as No Abnormality. The periwound has tenderness on palpation. Assessment Active Problems ICD-10 L89.522 - Pressure ulcer of left ankle, stage 2 EFSTATHIOS, SCHLAGETER ([id number]) F02.80 - Dementia in other diseases classified elsewhere without behavioral disturbance Procedures Wound #1 Wound #1 is  a Pressure Ulcer located on the Left,Lateral Malleolus . There was a Skin/Subcutaneous Tissue Debridement [id number]) debridement with total area of 0.35 sq cm performed by Christin Fudge, MD. with the following instrument(s): Curette to remove Viable and Non-Viable tissue/material including Fibrin/Slough and Subcutaneous after achieving pain control using Lidocaine 4% Topical Solution. A time out was conducted at 12:53, prior to the start of the procedure. A Minimum amount of bleeding was controlled with Pressure. The procedure was tolerated well with a pain level of 0 throughout and a pain  level of 1 following the procedure. Post Debridement Measurements: 0.5cm length x 0.7cm width x 0.2cm depth; 0.055cm^3 volume. Post debridement Stage noted as Category/Stage II. Character of Wound/Ulcer Post Debridement requires further debridement. Severity of Tissue Post Debridement is: Fat layer exposed. Post procedure Diagnosis Wound #1: Same as Pre-Procedure Plan Wound Cleansing: Wound #1 Left,Lateral Malleolus: Clean wound with Normal Saline. May Shower, gently pat wound dry prior to applying new dressing. Anesthetic: Wound #1 Left,Lateral Malleolus: Topical Lidocaine 4% cream applied to wound bed prior to debridement - for clinic use Skin Barriers/Peri-Wound Care: Wound #1 Left,Lateral Malleolus: Skin Prep Primary Wound Dressing: Wound #1 Left,Lateral Malleolus: Medihoney gel Secondary Dressing: Wound #1 Left,Lateral Malleolus: Dry Gauze Boardered Foam Dressing Follow-up Appointments: Wound #1 Left,Lateral Malleolus: Return Appointment in 1 week. Edema Control: DAIRON, SPOSATO ([id number]) Wound #1 Left,Lateral Malleolus: Elevate legs to the level of the heart and pump ankles as often as possible Off-Loading: Wound #1 Left,Lateral Malleolus: Turn and reposition every 2 hours Other: - keep pressure off of affected area Additional Orders / Instructions: Wound #1 Left,Lateral Malleolus: Increase protein intake. Medications-please add to medication list.: Wound #1 Left,Lateral Malleolus: Other: - Vitamin A, Vitamin C, Zinc, MVI ordered were: Zero-G Boot Heel Protector or [employer] I have recommended: 1. Medihoney to be applied daily, and a protective bordered foam 2. Off-Loading has been discussed in great detail 3. adequate protein, vitamin E, vitamin C and zinc 4. Regular visits to the wound center Electronic Signature(s) Signed: 06/12/2016 1:28:41 PM By: Christin Fudge MD, FACS Entered By: Christin Fudge on 06/12/2016 13:28:41 [name]  ([id number]) -------------------------------------------------------------------------------- SuperBill Details Patient Name: EZELL, VOGELPOHL. Date of Service: 06/12/2016 Medical Record Number: [id number] Patient Account Number: [id number] Date of Birth/Sex: [birth year] (82 y.o. Male) Treating RN: Ahmed Prima Primary Care Provider: [name] Other Clinician: Referring Provider: [name] Treating Provider/Extender: Christin Fudge Service Line: Weeks in Treatment: 2 Diagnosis Coding ICD-10 Codes Code Description [phone] Pressure ulcer of left ankle, stage 2 F02.80 Dementia in other diseases classified elsewhere without behavioral disturbance Facility Procedures CPT4: Description Modifier Quantity Code [id number] - DEB SUBQ TISSUE 20 SQ CM/< 1 ICD-10 Description Diagnosis L89.522 Pressure ulcer of left ankle, stage 2 F02.80 Dementia in other diseases classified elsewhere without behavioral disturbance Physician Procedures CPT4: Description Modifier Quantity Code [id number] - WC PHYS SUBQ TISS 20 SQ CM 1 ICD-10 Description Diagnosis L89.522 Pressure ulcer of left ankle, stage 2 F02.80 Dementia in other diseases classified elsewhere without behavioral disturbance Electronic Signature(s) Signed: 06/12/2016 1:29:06 PM By: Christin Fudge MD, FACS Entered By: Christin Fudge on 06/12/2016 13:29:06

## 2016-06-14 NOTE — Progress Notes (Signed)
JARMEL, KMETZ ([id number]) Visit Report for 06/12/2016 Arrival Information Details Patient Name: [name], [name]. Date of Service: 06/12/2016 12:30 PM Medical Record Number: [id number] Patient Account Number: [id number] Date of Birth/Sex: 1934/10/20 (82 y.o. Male) Treating RN: [name] Primary Care Hiawatha Merriott: [name] Other Clinician: Referring Jalee Saine: [name] Treating Khani Paino/Extender: [name] in Treatment: 2 Visit Information History Since Last Visit Added or deleted any medications: No Patient Arrived: Ambulatory Any new allergies or adverse reactions: No Arrival Time: 12:35 Had a fall or experienced change in No Accompanied By: wife activities of daily living that may affect Transfer Assistance: None risk of falls: Patient Identification Verified: Yes Signs or symptoms of abuse/neglect since last No Secondary Verification Process Yes visito Completed: Hospitalized since last visit: No Patient Requires Transmission-Based No Has Dressing in Place as Prescribed: Yes Precautions: Pain Present Now: No Patient Has Alerts: Yes Patient Alerts: ASA Electronic Signature(s) Signed: 06/12/2016 6:04:53 PM By: [name], RN, [name], [name], [name] Entered By: [name], RN, [name], Kim on 06/12/2016 12:36:41 [name] ([id number]) -------------------------------------------------------------------------------- Encounter Discharge Information Details Patient Name: [name], [name]. Date of Service: 06/12/2016 12:30 PM Medical Record Number: [id number] Patient Account Number: [id number] Date of Birth/Sex: Aug 24, 1934 (82 y.o. Male) Treating RN: Ahmed Prima Primary Care Yajayra Feldt: [name] Other Clinician: Referring Archit Leger: [name] Treating Eliyohu Class/Extender: [name] in Treatment: 2 Encounter Discharge Information Items Discharge Pain Level: 0 Discharge Condition: Stable Ambulatory Status: Ambulatory Discharge Destination:  Home Transportation: Private Auto Accompanied By: wife Schedule Follow-up Appointment: Yes Medication Reconciliation completed and provided to Patient/Care Yes Hildagarde Holleran: Provided on Clinical Summary of Care: 06/12/2016 Form Type Recipient Paper Patient ET Electronic Signature(s) Signed: 06/12/2016 6:04:53 PM By: [name] RN, [name], [name], [name] Previous Signature: 06/12/2016 1:14:30 PM Version By: Ruthine Dose Entered By: [name] RN, [name], Kim on 06/12/2016 13:19:19 [name] ([id number]) -------------------------------------------------------------------------------- Lower Extremity Assessment Details Patient Name: [name], [name]. Date of Service: 06/12/2016 12:30 PM Medical Record Number: [id number] Patient Account Number: [id number] Date of Birth/Sex: 12/22/1934 (82 y.o. Male) Treating RN: [name] Primary Care Marzetta Lanza: [name] Other Clinician: Referring Dyanna Seiter: [name] Treating Marshay Slates/Extender: [name] in Treatment: 2 Vascular Assessment Claudication: Claudication Assessment [Left:None] Pulses: Dorsalis Pedis Palpable: [Left:Yes] Posterior Tibial Extremity colors, hair growth, and conditions: Extremity Color: [Left:Normal] Hair Growth on Extremity: [Left:Yes] Temperature of Extremity: [Left:Warm] Capillary Refill: [Left:< 3 seconds] Dependent Rubor: [Left:No] Blanched when Elevated: [Left:No] Lipodermatosclerosis: [Left:No] Toe Nail Assessment Left: Right: Thick: No Discolored: No Deformed: No Improper Length and Hygiene: No Electronic Signature(s) Signed: 06/12/2016 6:04:53 PM By: [name], RN, [name], [name], [name] Entered By: [name], RN, [name], Kim on 06/12/2016 12:45:49 [name] ([id number]) -------------------------------------------------------------------------------- Multi Wound Chart Details Patient Name: [name], [name]. Date of Service: 06/12/2016 12:30 PM Medical Record Number: [id number] Patient Account Number:  [id number] Date of Birth/Sex: [birth year] (82 y.o. Male) Treating RN: [name] Primary Care Oakes Mccready: [name] Other Clinician: Referring Dnyla Antonetti: [name] Treating Shaddai Shapley/Extender: [name] in Treatment: 2 Vital Signs Height(in): 69 Pulse(bpm): 54 Weight(lbs): 174.8 Blood Pressure 139/59 (mmHg): Body Mass Index(BMI): 26 Temperature(F): 97.5 Respiratory Rate 16 (breaths/min): Photos: [N/A:N/A] Wound Location: Left Malleolus - Lateral N/A N/A Wounding Event: Pressure Injury N/A N/A Primary Etiology: Pressure Ulcer N/A N/A Comorbid History: Cataracts, Anemia, Sleep N/A N/A Apnea, Arrhythmia, Hypertension, Dementia Date Acquired: 05/01/2016 N/A N/A Weeks of Treatment: 2 N/A N/A Wound Status: Open N/A N/A Measurements L x W x  D 0.5x0.7x0.1 N/A N/A (cm) Area (cm) : 0.275 N/A N/A Volume (cm) : 0.027 N/A N/A % Reduction in Area: -40.30% N/A N/A % Reduction in Volume: -35.00% N/A N/A Classification: Category/Stage II N/A N/A Exudate Amount: Large N/A N/A Exudate Type: Serosanguineous N/A N/A Exudate Color: red, brown N/A N/A Wound Margin: Distinct, outline attached N/A N/A Granulation Amount: Medium (34-66%) N/A N/A Granulation Quality: Pink N/A N/A Necrotic Amount: Medium (34-66%) N/A N/A Necrotic Tissue: Eschar N/A N/A [name], [name] ([id number]) Exposed Structures: Fat Layer (Subcutaneous N/A N/A Tissue) Exposed: Yes Epithelialization: None N/A N/A Debridement: Debridement [id number] N/A N/A [id number]) Pre-procedure 12:53 N/A N/A Verification/Time Out Taken: Pain Control: Lidocaine 4% Topical N/A N/A Solution Tissue Debrided: Fibrin/Slough, N/A N/A Subcutaneous Level: Skin/Subcutaneous N/A N/A Tissue Debridement Area (sq 0.35 N/A N/A cm): Instrument: Curette N/A N/A Bleeding: Minimum N/A N/A Hemostasis Achieved: Pressure N/A N/A Procedural Pain: 0 N/A N/A Post Procedural Pain: 1 N/A N/A Debridement Treatment Procedure was  tolerated N/A N/A Response: well Post Debridement 0.5x0.7x0.2 N/A N/A Measurements L x W x D (cm) Post Debridement 0.055 N/A N/A Volume: (cm) Post Debridement Category/Stage II N/A N/A Stage: Periwound Skin Texture: No Abnormalities Noted N/A N/A Periwound Skin No Abnormalities Noted N/A N/A Moisture: Periwound Skin Color: Erythema: Yes N/A N/A Erythema Location: Circumferential N/A N/A Temperature: No Abnormality N/A N/A Tenderness on Yes N/A N/A Palpation: Wound Preparation: Ulcer Cleansing: N/A N/A Rinsed/Irrigated with Saline Topical Anesthetic Applied: Other: lidocaine 4% Procedures Performed: Debridement N/A N/A Treatment Notes Wound #1 (Left, Lateral Malleolus) [place], Simpson M. ([id number]) 1. Cleansed with: Clean wound with Normal Saline 2. Anesthetic Topical Lidocaine 4% cream to wound bed prior to debridement 4. Dressing Applied: Medihoney Gel 5. Secondary Dressing Applied Bordered Foam Dressing Electronic Signature(s) Signed: 06/12/2016 1:27:04 PM By: Christin Fudge MD, FACS Entered By: Christin Fudge on 06/12/2016 13:27:04 [name] ([id number]) -------------------------------------------------------------------------------- [place] Details Patient Name: [name], [name]. Date of Service: 06/12/2016 12:30 PM Medical Record Number: [id number] Patient Account Number: [id number] Date of Birth/Sex: 03/05/1935 (82 y.o. Male) Treating RN: [name] Primary Care Davyn Morandi: [name] Other Clinician: Referring Faron Tudisco: [name] Treating Kyarra Vancamp/Extender: [name] in Treatment: 2 Active Inactive ` Abuse / Safety / Falls / Self Care Management Nursing Diagnoses: Potential for falls Goals: Patient will remain injury free Date Initiated: 05/29/2016 Target Resolution Date: 08/02/2016 Goal Status: Active Interventions: Assess fall risk on admission and as needed Assess impairment of mobility on admission and  as needed per policy Notes: ` Nutrition Nursing Diagnoses: Imbalanced nutrition Goals: Patient/caregiver agrees to and verbalizes understanding of need to use nutritional supplements and/or vitamins as prescribed Date Initiated: 05/29/2016 Target Resolution Date: 08/02/2016 Goal Status: Active Interventions: Assess patient nutrition upon admission and as needed per policy Notes: ` Orientation to the Wound Care Program Nursing Diagnoses: [name], ISGRO ([id number]) Knowledge deficit related to the wound healing center program Goals: Patient/caregiver will verbalize understanding of the [facility] Program Date Initiated: 05/29/2016 Target Resolution Date: 06/14/2016 Goal Status: Active Interventions: Provide education on orientation to the wound center Notes: ` Pain, Acute or Chronic Nursing Diagnoses: Pain, acute or chronic: actual or potential Potential alteration in comfort, pain Goals: Patient will verbalize adequate pain control and receive pain control interventions during procedures as needed Date Initiated: 05/29/2016 Target Resolution Date: 08/02/2016 Goal Status: Active Interventions: Complete pain assessment as per visit requirements Notes: ` Wound/Skin Impairment Nursing Diagnoses: Impaired tissue integrity Knowledge deficit related to ulceration/compromised skin integrity  Goals: Ulcer/skin breakdown will have a volume reduction of 80% by week 12 Date Initiated: 05/29/2016 Target Resolution Date: 08/02/2016 Goal Status: Active Interventions: Assess patient/caregiver ability to perform ulcer/skin care regimen upon admission and as needed Assess ulceration(s) every visit Notes: [name], [name] ([id number]) Electronic Signature(s) Signed: 06/12/2016 6:04:53 PM By: [name], RN, [name], [name], [name] Entered By: [name], RN, [name], Kim on 06/12/2016 12:46:05 [name]  ([id number]) -------------------------------------------------------------------------------- Pain Assessment Details Patient Name: [name], [name]. Date of Service: 06/12/2016 12:30 PM Medical Record Number: [id number] Patient Account Number: [id number] Date of Birth/Sex: 04/03/1935 (82 y.o. Male) Treating RN: [name] Primary Care Jaimee Corum: [name] Other Clinician: Referring Caedin Mogan: [name] Treating Jasline Buskirk/Extender: [name] in Treatment: 2 Active Problems Location of Pain Severity and Description of Pain Patient Has Paino No Site Locations With Dressing Change: No Pain Management and Medication Current Pain Management: Goals for Pain Management Topical or injectable lidocaine is offered to patient for acute pain when surgical debridement is performed. If needed, Patient is instructed to use over the counter pain medication for the following 24-48 hours after debridement. Wound care MDs do not prescribed pain medications. Patient has chronic pain or uncontrolled pain. Patient has been instructed to make an appointment with their Primary Care Physician for pain management. Electronic Signature(s) Signed: 06/12/2016 6:04:53 PM By: [name], RN, [name], [name], [name] Entered By: [name], RN, [name], Kim on 06/12/2016 12:38:24 [name] ([id number]) -------------------------------------------------------------------------------- Patient/Caregiver Education Details Patient Name: [name], [name]. Date of Service: 06/12/2016 12:30 PM Medical Record Number: [id number] Patient Account Number: [id number] Date of Birth/Gender: [birth year] (82 y.o. Male) Treating RN: [name] Primary Care Physician: [name] Other Clinician: Referring Physician: [name] Treating Physician/Extender: [name] in Treatment: 2 Education Assessment Education Provided To: Patient Education Topics Provided Wound/Skin Impairment: Handouts: Caring for Your  Ulcer Methods: Demonstration Responses: State content correctly Electronic Signature(s) Signed: 06/12/2016 6:04:53 PM By: [name], RN, [name], [name], [name] Entered By: [name], RN, [name], Kim on 06/12/2016 13:19:35 [name] ([id number]) -------------------------------------------------------------------------------- Wound Assessment Details Patient Name: [name], [name]. Date of Service: 06/12/2016 12:30 PM Medical Record Number: [id number] Patient Account Number: [id number] Date of Birth/Sex: [birth year] (82 y.o. Male) Treating RN: [name] Primary Care Toshia Larkin: [name] Other Clinician: Referring Jeremih Dearmas: [name] Treating Jenaro Souder/Extender: [name] in Treatment: 2 Wound Status Wound Number: 1 Primary Pressure Ulcer Etiology: Wound Location: Left Malleolus - Lateral Wound Open Wounding Event: Pressure Injury Status: Date Acquired: 05/01/2016 Comorbid Cataracts, Anemia, Sleep Apnea, Weeks Of Treatment: 2 History: Arrhythmia, Hypertension, Dementia Clustered Wound: No Photos Wound Measurements Length: (cm) 0.5 Width: (cm) 0.7 Depth: (cm) 0.1 Area: (cm) 0.275 Volume: (cm) 0.027 % Reduction in Area: -40.3% % Reduction in Volume: -35% Epithelialization: None Wound Description Classification: Category/Stage II Wound Margin: Distinct, outline attached Exudate Amount: Large Exudate Type: Serosanguineous Exudate Color: red, brown Foul Odor After Cleansing: No Slough/Fibrino No Wound Bed Granulation Amount: Medium (34-66%) Exposed Structure Granulation Quality: Pink Fat Layer (Subcutaneous Tissue) Exposed: Yes Necrotic Amount: Medium (34-66%) Necrotic Quality: Eschar Periwound Skin Texture Texture Color No Abnormalities Noted: No No Abnormalities Noted: No [name], [name] ([id number]) Moisture Erythema: Yes No Abnormalities Noted: No Erythema Location: Circumferential Temperature / Pain Temperature: No Abnormality Tenderness on  Palpation: Yes Wound Preparation Ulcer Cleansing: Rinsed/Irrigated with Saline Topical Anesthetic Applied: Other: lidocaine 4%, Treatment Notes Wound #1 (Left, Lateral Malleolus) 1. Cleansed with: Clean wound with Normal Saline 2. Anesthetic  Topical Lidocaine 4% cream to wound bed prior to debridement 4. Dressing Applied: Medihoney Gel 5. Secondary Dressing Applied Bordered Foam Dressing Electronic Signature(s) Signed: 06/12/2016 6:04:53 PM By: [name], RN, [name], [name], [name] Entered By: [name], RN, [name], Kim on 06/12/2016 12:45:03 [name] ([id number]) -------------------------------------------------------------------------------- [place] Details Patient Name: [name], [name]. Date of Service: 06/12/2016 12:30 PM Medical Record Number: [id number] Patient Account Number: [id number] Date of Birth/Sex: Oct 12, 1934 (82 y.o. Male) Treating RN: [name] Primary Care Yanilen Adamik: [name] Other Clinician: Referring Aleister Lady: [name] Treating Ahamed Hofland/Extender: [name] in Treatment: 2 Vital Signs Time Taken: 12:38 Temperature (F): 97.5 Height (in): 69 Pulse (bpm): 54 Weight (lbs): 174.8 Respiratory Rate (breaths/min): 16 Body Mass Index (BMI): 25.8 Blood Pressure (mmHg): 139/59 Reference Range: 80 - 120 mg / dl Electronic Signature(s) Signed: 06/12/2016 6:04:53 PM By: [name], RN, [name], [name], [name] Entered By: [name], RN, [name], Kim on 06/12/2016 12:38:43

## 2016-06-16 DIAGNOSIS — I10 Essential (primary) hypertension: Secondary | ICD-10-CM | POA: Diagnosis not present

## 2016-06-17 ENCOUNTER — Ambulatory Visit: Payer: PPO | Admitting: Physical Therapy

## 2016-06-17 DIAGNOSIS — R296 Repeated falls: Secondary | ICD-10-CM

## 2016-06-17 DIAGNOSIS — M6281 Muscle weakness (generalized): Secondary | ICD-10-CM | POA: Diagnosis not present

## 2016-06-17 DIAGNOSIS — R2689 Other abnormalities of gait and mobility: Secondary | ICD-10-CM

## 2016-06-17 DIAGNOSIS — R293 Abnormal posture: Secondary | ICD-10-CM

## 2016-06-17 NOTE — Therapy (Signed)
[facility] [address] [place], [place], [address] Phone: [phone]   Fax:  [phone]  Physical Therapy Treatment  Patient Details  Name: [name] MRN: [id number] Date of Birth: [birth year] Referring Provider: Jennings Books, MD  Encounter Date: 06/17/2016      PT End of Session - 06/17/16 1554    Visit Number 2   Number of Visits 8   Date for PT Re-Evaluation 07/10/16   Authorization - Visit Number 2   Authorization - Number of Visits 10   PT Start Time [id number]   PT Stop Time 1436   PT Time Calculation (min) 51 min   Equipment Utilized During Treatment Gait belt   Activity Tolerance Patient tolerated treatment well   Behavior During Therapy [facility] for tasks assessed/performed      Past Medical History:  Diagnosis Date  . Absolute anemia 09/11/2014  . Anxiety   . Atrial fibrillation ([place]) 01/23/2014  . BP (high blood pressure) 11/07/2013  . Carotid artery disease ([place]) 09/11/2014  . Complete rotator cuff rupture of left shoulder 12/30/2013  . Dementia 09/11/2014  . Depression   . Heart murmur   . HLD (hyperlipidemia) 01/23/2014  . Hypertension   . Obstructive apnea 11/07/2013  . Personal history of other diseases of the circulatory system 01/23/2014  . Sleep apnea   . 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 Back Surgery- Tumor Removal    . SHOULDER SURGERY Right 2007    There were no vitals filed for this visit.      Subjective Assessment - 06/17/16 1345    Subjective Pt presented to physical therapy session wearing left knee compression sleeve. Patient and wife report compliance with HEP everyday except one.   Patient is accompained by: Family member   Pertinent History Hx of left knee pain, low back pain, shoulder pain. Hx of falls, dementia, and teretoma of the spine   Limitations Sitting;Lifting;Standing;Walking;House hold activities   Diagnostic tests  BERG   Patient Stated Goals get stronger, pick up legs better when walking, getting up easier and faster   Currently in Pain? No/denies     TherEx: Nautilus: standing rows 2x10 with 20lb and tactile cues for body mechanics to improve posture. Bridging 1x8. Reviewed body mechanics for HEP. Pt. Demonstrated understanding Marching 1x20  // bars with BUE assistance. Cues for upright posture  Neuro Re-ed:  Airex: Standing on airex 2x60 seconds. Step over 3" step in parallel bars 6x. CGA Step up 6" step in // bars 6x CGA. Required cues to step up rather than step over.  Gait: -ambulating ~40 ft with CGA 8x Verbal/tactile cues required for postural correction and increased step length -// bars: ambulating length with no LOB -ambulating between interventions to assess carryover to gait mechanics.          PT Education - 06/17/16 1554    Education provided Yes   Education Details Bridging HEP exercise review   Person(s) Educated Patient;Spouse   Methods Explanation;Demonstration   Comprehension Verbalized understanding;Returned demonstration             PT Long Term Goals - 06/12/16 1815      PT LONG TERM GOAL #1   Title Patient will increase score on Berg Balance to 49/56 for decreased fall risk.    Baseline 2/8: 44/56   Time 4   Period Weeks   Status  New     PT LONG TERM GOAL #2   Title Patient will have gross 4/5 bilateral LE strength to increase ability to perform functional activities.    Baseline Patient has a gross LLE 3+/5, RLE 4-/5   Time 4   Period Weeks   Status New     PT LONG TERM GOAL #3   Title Patient will ambulate 5 minutes with a cane without LOB to increase community mobility.     Baseline Patient fatigues quickly.    Time 4   Period Weeks   Status New     PT LONG TERM GOAL #4   Title Patient will perform 10 sit to stands without use of hands or LOB to demonstrate improved capacity for functional activities.    Baseline Patient can perform 1  sit to stand without hands.    Time 4   Period Weeks   Status New               Plan - 06/17/16 1600    Clinical Impression Statement Patient demonstrated HEP compliance since evaluation.  Patient presents with shuffling gait and forward trunk posture and requires frequent verbal and tactile cueing for correction.  Patient demonstrated carryover of interventions when ambulating with increased step length and upright posture. Patient continues to fatigue with dynamic standing activities and requires frequent rest breaks. Patient will benefit from continued skilled physical therapy for decreased fall risk, improved balance, increased gross strengthening, posture, and ROM.    Rehab Potential Fair   Clinical Impairments Affecting Rehab Potential hx of falls, dementia, teratoma, mitral valve polapse, cancer, a fib, L knee injury, L shoulder, back pain   PT Frequency 2x / week   PT Duration 4 weeks   PT Treatment/Interventions ADLs/Self Care Home Management;Aquatic Therapy;Cryotherapy;Electrical Stimulation;Biofeedback;Moist Heat;DME Instruction;Gait training;Stair training;Functional mobility training;Therapeutic activities;Therapeutic exercise;Balance training;Neuromuscular re-education;Patient/family education;Orthotic Fit/Training;Manual techniques;Passive range of motion;Dry needling;Energy conservation   PT Next Visit Plan Cane education, posture, balance   PT Home Exercise Plan see sheet   Consulted and Agree with Plan of Care Patient;Family member/caregiver   Family Member Consulted wife      Patient will benefit from skilled therapeutic intervention in order to improve the following deficits and impairments:  Abnormal gait, Decreased activity tolerance, Decreased balance, Decreased mobility, Decreased knowledge of use of DME, Decreased endurance, Decreased range of motion, Decreased safety awareness, Decreased skin integrity, Decreased knowledge of precautions, Decreased strength,  Hypomobility, Difficulty walking, Impaired flexibility, Postural dysfunction, Improper body mechanics, Decreased cognition  Visit Diagnosis: Muscle weakness (generalized)  Other abnormalities of gait and mobility  Repeated falls  Abnormal posture     Problem List Patient Active Problem List   Diagnosis Date Noted  . Fall 02/18/2016  . Scalp laceration 02/18/2016  . Splenic laceration 02/18/2016  . Acute blood loss anemia 02/18/2016  . Multiple rib fractures 02/16/2016  . Mild dementia 04/10/2015  . Aggrieved 03/02/2015  . Absolute anemia 09/11/2014  . Carotid artery disease ([place]) 09/11/2014  . Dementia 09/11/2014  . History of repair of rotator cuff 03/02/2014  . Atrial fibrillation with RVR ([place]) 01/23/2014  . Personal history of other diseases of the circulatory system 01/23/2014  . HLD (hyperlipidemia) 01/23/2014  . Complete rotator cuff rupture of left shoulder 12/30/2013  . BP (high blood pressure) 11/07/2013  . Amnesia 11/07/2013  . Obstructive apnea 11/07/2013   Pura Spice, PT, DPT # [id number] Janna Arch, SPT 06/18/2016, 11:34 AM  [place]  [address] [place], [place], [address] Phone: [phone]   Fax:  [phone]  Name: [name] MRN: [id number] Date of Birth: 1935/05/01

## 2016-06-18 ENCOUNTER — Encounter: Payer: Self-pay | Admitting: Physical Therapy

## 2016-06-19 ENCOUNTER — Encounter: Payer: PPO | Admitting: Surgery

## 2016-06-19 ENCOUNTER — Ambulatory Visit: Payer: PPO | Admitting: Physical Therapy

## 2016-06-19 DIAGNOSIS — R293 Abnormal posture: Secondary | ICD-10-CM

## 2016-06-19 DIAGNOSIS — L89522 Pressure ulcer of left ankle, stage 2: Secondary | ICD-10-CM | POA: Diagnosis not present

## 2016-06-19 DIAGNOSIS — R2689 Other abnormalities of gait and mobility: Secondary | ICD-10-CM

## 2016-06-19 DIAGNOSIS — M6281 Muscle weakness (generalized): Secondary | ICD-10-CM

## 2016-06-19 DIAGNOSIS — R296 Repeated falls: Secondary | ICD-10-CM

## 2016-06-19 NOTE — Therapy (Signed)
[facility] [address] [place], [place], [address] Phone: [phone]   Fax:  [phone]  Physical Therapy Treatment  Patient Details  Name: [name] MRN: [id number] Date of Birth: Feb 13, 1935 Referring Provider: Jennings Books, MD  Encounter Date: 06/19/2016      PT End of Session - 06/19/16 1622    Visit Number 3   Number of Visits 8   Date for PT Re-Evaluation 07/10/16   Authorization - Visit Number 3   Authorization - Number of Visits 10   PT Start Time 0108   PT Stop Time 0208   PT Time Calculation (min) 60 min   Equipment Utilized During Treatment Gait belt   Activity Tolerance Patient tolerated treatment well   Behavior During Therapy [facility] for tasks assessed/performed      Past Medical History:  Diagnosis Date  . Absolute anemia 09/11/2014  . Anxiety   . Atrial fibrillation ([place]) 01/23/2014  . BP (high blood pressure) 11/07/2013  . Carotid artery disease ([place]) 09/11/2014  . Complete rotator cuff rupture of left shoulder 12/30/2013  . Dementia 09/11/2014  . Depression   . Heart murmur   . HLD (hyperlipidemia) 01/23/2014  . Hypertension   . Obstructive apnea 11/07/2013  . Personal history of other diseases of the circulatory system 01/23/2014  . Sleep apnea   . 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 Back Surgery- Tumor Removal    . SHOULDER SURGERY Right 2007    There were no vitals filed for this visit.      Subjective Assessment - 06/19/16 1619    Subjective Patients wife reports patient did not get out of bed until after 3 yesterday. Patient reports being excited and ready for therapy and was wearing left knee compression sleeve.    Patient is accompained by: Family member   Pertinent History Hx of left knee pain, low back pain, shoulder pain. Hx of falls, dementia, and teretoma of the spine   Limitations Sitting;Lifting;Standing;Walking;House  hold activities   Diagnostic tests BERG   Patient Stated Goals get stronger, pick up legs better when walking, getting up easier and faster   Currently in Pain? No/denies     TherEx: -walking marches in // bars without UE assist. 6x. Cues for upright posture and height of knees. -Seated hamstring stretch 1x60 seconds -knees to chest 1x60 seconds -LE rotation 1x60  -1lb arm abduction in standing 2x10. Helped maintain upright posture Nustep 10 mins lvl 6   Neuro Re-ed: - Big arms during walking (LSVT BIG style) 4x~50 ft . Frequent cues for continuation   Gait -ambulating between parallel bars without UE use with cues for posture and gait mechanics. 6x. -Big arms with 1lb weights during walking. Frequent cues -big steps during walking (LSVT BIG style) 4x ~50 ft. . Frequent cues for continuation.    Pt response to medical necessity: Patient will benefit from skilled physical therapy to improve gait mechanics, balance, decrease falls, improve gross strength and posture.         PT Education - 06/19/16 1620    Education provided Yes   Education Details Hamstring stretch, standing marching and STS during TV, knees to chest, rhythmic rotation. See sheet   Person(s) Educated Patient;Spouse   Methods Explanation;Demonstration;Handout   Comprehension Verbalized understanding;Returned demonstration             PT Long Term Goals -  06/12/16 1815      PT LONG TERM GOAL #1   Title Patient will increase score on Berg Balance to 49/56 for decreased fall risk.    Baseline 2/8: 44/56   Time 4   Period Weeks   Status New     PT LONG TERM GOAL #2   Title Patient will have gross 4/5 bilateral LE strength to increase ability to perform functional activities.    Baseline Patient has a gross LLE 3+/5, RLE 4-/5   Time 4   Period Weeks   Status New     PT LONG TERM GOAL #3   Title Patient will ambulate 5 minutes with a cane without LOB to increase community mobility.     Baseline  Patient fatigues quickly.    Time 4   Period Weeks   Status New     PT LONG TERM GOAL #4   Title Patient will perform 10 sit to stands without use of hands or LOB to demonstrate improved capacity for functional activities.    Baseline Patient can perform 1 sit to stand without hands.    Time 4   Period Weeks   Status New               Plan - 06/19/16 1623    Clinical Impression Statement Patient was eager and participated well in therapy today. Patient continues to demonstrate decreased capacity for functional activities requiring frequent rest breaks.  Festinating and Parkinsonial gait tendencies appear during ambulation and marching ambulation. Patient has difficulty performing multitask activities and performs much better when broken down into pieces. Forward trunk flexion in ambulation was corrected with verbal/tactile cueing. Patient has limited hamstring flexibility and was given seated hamstring stretch to perform at home. Patient and wife report occasional back and hip pain and upon examination patient does not appear to have leg length discrepancy but does have gross weakness of core,LE's , UE's , and limited flexibility. Patient will benefit from continued skilled physical therapy to decrease falls risk, improve balance, gross strengthening, posture, and ROM.    Rehab Potential Fair   Clinical Impairments Affecting Rehab Potential hx of falls, dementia, teratoma, mitral valve polapse, cancer, a fib, L knee injury, L shoulder, back pain   PT Frequency 2x / week   PT Duration 4 weeks   PT Treatment/Interventions ADLs/Self Care Home Management;Aquatic Therapy;Cryotherapy;Electrical Stimulation;Biofeedback;Moist Heat;DME Instruction;Gait training;Stair training;Functional mobility training;Therapeutic activities;Therapeutic exercise;Balance training;Neuromuscular re-education;Patient/family education;Orthotic Fit/Training;Manual techniques;Passive range of motion;Dry needling;Energy  conservation   PT Next Visit Plan back pain , posture, stretching   PT Home Exercise Plan see sheet   Consulted and Agree with Plan of Care Patient;Family member/caregiver   Family Member Consulted wife      Patient will benefit from skilled therapeutic intervention in order to improve the following deficits and impairments:  Abnormal gait, Decreased activity tolerance, Decreased balance, Decreased mobility, Decreased knowledge of use of DME, Decreased endurance, Decreased range of motion, Decreased safety awareness, Decreased skin integrity, Decreased knowledge of precautions, Decreased strength, Hypomobility, Difficulty walking, Impaired flexibility, Postural dysfunction, Improper body mechanics, Decreased cognition  Visit Diagnosis: Muscle weakness (generalized)  Other abnormalities of gait and mobility  Repeated falls  Abnormal posture     Problem List Patient Active Problem List   Diagnosis Date Noted  . Fall 02/18/2016  . Scalp laceration 02/18/2016  . Splenic laceration 02/18/2016  . Acute blood loss anemia 02/18/2016  . Multiple rib fractures 02/16/2016  . Mild dementia 04/10/2015  . Aggrieved 03/02/2015  .  Absolute anemia 09/11/2014  . Carotid artery disease ([place]) 09/11/2014  . Dementia 09/11/2014  . History of repair of rotator cuff 03/02/2014  . Atrial fibrillation with RVR ([place]) 01/23/2014  . Personal history of other diseases of the circulatory system 01/23/2014  . HLD (hyperlipidemia) 01/23/2014  . Complete rotator cuff rupture of left shoulder 12/30/2013  . BP (high blood pressure) 11/07/2013  . Amnesia 11/07/2013  . Obstructive apnea 11/07/2013   Pura Spice, PT, DPT # [id number] Janna Arch, SPT 06/19/2016, 5:29 PM  [place] [facility] [address] [place], [place], [address] Phone: [phone]   Fax:  [phone]  Name: [name] MRN: [id number] Date of Birth: [birth year]

## 2016-06-21 NOTE — Progress Notes (Signed)
ROBIE, VITITOE ([id number]) Visit Report for 06/19/2016 Arrival Information Details Patient Name: CONSUELO, BODKINS. Date of Service: 06/19/2016 3:30 PM Medical Record Number: [id number] Patient Account Number: [id number] Date of Birth/Sex: May 30, 1934 (82 y.o. Male) Treating RN: Ahmed Prima Primary Care Jerrard Bradburn: [name] Other Clinician: Referring Litha Lamartina: [name] Treating Gene Glazebrook/Extender: [name] in Treatment: 3 Visit Information History Since Last Visit All ordered tests and consults were completed: No Patient Arrived: Ambulatory Added or deleted any medications: No Arrival Time: 15:18 Any new allergies or adverse reactions: No Accompanied By: wife Had a fall or experienced change in No Transfer Assistance: None activities of daily living that may affect Patient Identification Verified: Yes risk of falls: Secondary Verification Process Yes Signs or symptoms of abuse/neglect since last No Completed: visito Patient Requires Transmission-Based No Hospitalized since last visit: No Precautions: Has Dressing in Place as Prescribed: Yes Patient Has Alerts: Yes Pain Present Now: No Patient Alerts: ASA Electronic Signature(s) Signed: 06/20/2016 4:03:50 PM By: Alric Quan Entered By: Alric Quan on 06/19/2016 15:18:32 [name] ([id number]) -------------------------------------------------------------------------------- Encounter Discharge Information Details Patient Name: DEMIR, CLAFFEY. Date of Service: 06/19/2016 3:30 PM Medical Record Number: [id number] Patient Account Number: [id number] Date of Birth/Sex: 02-08-1935 (82 y.o. Male) Treating RN: Ahmed Prima Primary Care Knight Oelkers: [name] Other Clinician: Referring Runell Kovich: [name] Treating Lennyx Verdell/Extender: [name] in Treatment: 3 Encounter Discharge Information Items Discharge Pain Level: 0 Discharge Condition: Stable Ambulatory  Status: Ambulatory Discharge Destination: Home Transportation: Private Auto Accompanied By: wife Schedule Follow-up Appointment: Yes Medication Reconciliation completed and provided to Patient/Care No Elisabet Gutzmer: Provided on Clinical Summary of Care: 06/19/2016 Form Type Recipient Paper Patient ET Electronic Signature(s) Signed: 06/19/2016 3:43:06 PM By: Ruthine Dose Entered By: Ruthine Dose on 06/19/2016 15:43:05 [name] ([id number]) -------------------------------------------------------------------------------- Lower Extremity Assessment Details Patient Name: [name]. Date of Service: 06/19/2016 3:30 PM Medical Record Number: [id number] Patient Account Number: [id number] Date of Birth/Sex: Dec 28, 1934 (82 y.o. Male) Treating RN: Ahmed Prima Primary Care Terrell Shimko: [name] Other Clinician: Referring Josedaniel Haye: [name] Treating Tadan Shill/Extender: [name] in Treatment: 3 Vascular Assessment Pulses: Dorsalis Pedis Palpable: [Left:Yes] Posterior Tibial Extremity colors, hair growth, and conditions: Extremity Color: [Left:Normal] Temperature of Extremity: [Left:Warm] Capillary Refill: [Left:< 3 seconds] Toe Nail Assessment Left: Right: Thick: No Discolored: No Deformed: No Improper Length and Hygiene: No Electronic Signature(s) Signed: 06/20/2016 4:03:50 PM By: Alric Quan Entered By: Alric Quan on 06/19/2016 15:24:41 [name] ([id number]) -------------------------------------------------------------------------------- Multi Wound Chart Details Patient Name: [name]. Date of Service: 06/19/2016 3:30 PM Medical Record Number: [id number] Patient Account Number: [id number] Date of Birth/Sex: 1934-05-26 (82 y.o. Male) Treating RN: Ahmed Prima Primary Care Tal Neer: [name] Other Clinician: Referring Meighan Treto: [name] Treating Ehan Freas/Extender: [name] in Treatment:  3 Vital Signs Height(in): 69 Pulse(bpm): 62 Weight(lbs): 174.8 Blood Pressure 133/58 (mmHg): Body Mass Index(BMI): 26 Temperature(F): 97.7 Respiratory Rate 16 (breaths/min): Photos: [1:No Photos] [N/A:N/A] Wound Location: [1:Left Malleolus - Lateral] [N/A:N/A] Wounding Event: [1:Pressure Injury] [N/A:N/A] Primary Etiology: [1:Pressure Ulcer] [N/A:N/A] Comorbid History: [1:Cataracts, Anemia, Sleep Apnea, Arrhythmia, Hypertension, Dementia] [N/A:N/A] Date Acquired: [1:05/01/2016] [N/A:N/A] Weeks of Treatment: [1:3] [N/A:N/A] Wound Status: [1:Open] [N/A:N/A] Measurements L x W x D 0.4x0.5x0.2 [N/A:N/A] (cm) Area (cm) : [1:0.157] [N/A:N/A] Volume (cm) : [1:0.031] [N/A:N/A] % Reduction in Area: [1:19.90%] [N/A:N/A] % Reduction in Volume: -55.00% [N/A:N/A] Classification: [1:Category/Stage II] [N/A:N/A] Exudate Amount: [1:Large] [N/A:N/A] Exudate Type: [1:Serous] [N/A:N/A] Exudate Color: [1:amber] [N/A:N/A]  Wound Margin: [1:Distinct, outline attached] [N/A:N/A] Granulation Amount: [1:None Present (0%)] [N/A:N/A] Necrotic Amount: [1:Large (67-100%)] [N/A:N/A] Necrotic Tissue: [1:Eschar, Adherent Slough] [N/A:N/A] Exposed Structures: [1:Fat Layer (Subcutaneous Tissue) Exposed: Yes] [N/A:N/A] Epithelialization: [1:None] [N/A:N/A] Periwound Skin Texture: No Abnormalities Noted [1:No Abnormalities Noted] [N/A:N/A N/A] Periwound Skin Moisture: Periwound Skin Color: Erythema: Yes N/A N/A Erythema Location: Circumferential N/A N/A Temperature: No Abnormality N/A N/A Tenderness on Yes N/A N/A Palpation: Wound Preparation: Ulcer Cleansing: N/A N/A Rinsed/Irrigated with Saline Topical Anesthetic Applied: Other: lidocaine 4% Treatment Notes Electronic Signature(s) Signed: 06/20/2016 4:03:50 PM By: Alric Quan Entered By: Alric Quan on 06/19/2016 15:27:34 CHARLIES, SIEMINSKI  ([id number]) -------------------------------------------------------------------------------- Multi-Disciplinary Care Plan Details Patient Name: SUBHASH, UPSHUR. Date of Service: 06/19/2016 3:30 PM Medical Record Number: [id number] Patient Account Number: [id number] Date of Birth/Sex: Dec 08, 1934 (82 y.o. Male) Treating RN: Ahmed Prima Primary Care Claudene Gatliff: [name] Other Clinician: Referring Cherise Fedder: [name] Treating Matti Killingsworth/Extender: [name] in Treatment: 3 Active Inactive ` Abuse / Safety / Falls / Self Care Management Nursing Diagnoses: Potential for falls Goals: Patient will remain injury free Date Initiated: 05/29/2016 Target Resolution Date: 08/02/2016 Goal Status: Active Interventions: Assess fall risk on admission and as needed Assess impairment of mobility on admission and as needed per policy Notes: ` Nutrition Nursing Diagnoses: Imbalanced nutrition Goals: Patient/caregiver agrees to and verbalizes understanding of need to use nutritional supplements and/or vitamins as prescribed Date Initiated: 05/29/2016 Target Resolution Date: 08/02/2016 Goal Status: Active Interventions: Assess patient nutrition upon admission and as needed per policy Notes: ` Orientation to the Wound Care Program Nursing Diagnoses: MCLAIN, BRADWAY ([id number]) Knowledge deficit related to the wound healing center program Goals: Patient/caregiver will verbalize understanding of the [place] Program Date Initiated: 05/29/2016 Target Resolution Date: 06/14/2016 Goal Status: Active Interventions: Provide education on orientation to the wound center Notes: ` Pain, Acute or Chronic Nursing Diagnoses: Pain, acute or chronic: actual or potential Potential alteration in comfort, pain Goals: Patient will verbalize adequate pain control and receive pain control interventions during procedures as needed Date Initiated: 05/29/2016 Target Resolution  Date: 08/02/2016 Goal Status: Active Interventions: Complete pain assessment as per visit requirements Notes: ` Wound/Skin Impairment Nursing Diagnoses: Impaired tissue integrity Knowledge deficit related to ulceration/compromised skin integrity Goals: Ulcer/skin breakdown will have a volume reduction of 80% by week 12 Date Initiated: 05/29/2016 Target Resolution Date: 08/02/2016 Goal Status: Active Interventions: Assess patient/caregiver ability to perform ulcer/skin care regimen upon admission and as needed Assess ulceration(s) every visit Notes: ANTERO, MELROY ([id number]) Electronic Signature(s) Signed: 06/20/2016 4:03:50 PM By: Alric Quan Entered By: Alric Quan on 06/19/2016 15:27:24 [name] ([id number]) -------------------------------------------------------------------------------- Pain Assessment Details Patient Name: SILVIO, HELSLEY. Date of Service: 06/19/2016 3:30 PM Medical Record Number: [id number] Patient Account Number: [id number] Date of Birth/Sex: [birth year] (82 y.o. Male) Treating RN: Ahmed Prima Primary Care Lyman Balingit: [name] Other Clinician: Referring Lasya Vetter: [name] Treating Norena Bratton/Extender: [name] in Treatment: 3 Active Problems Location of Pain Severity and Description of Pain Patient Has Paino No Site Locations With Dressing Change: No Pain Management and Medication Current Pain Management: Electronic Signature(s) Signed: 06/20/2016 4:03:50 PM By: Alric Quan Entered By: Alric Quan on 06/19/2016 15:18:38 [name] ([id number]) -------------------------------------------------------------------------------- Patient/Caregiver Education Details Patient Name: MOO, ZASTOUPIL. Date of Service: 06/19/2016 3:30 PM Medical Record Number: [id number] Patient Account Number: [id number] Date of Birth/Gender: [birth year] (82 y.o. Male) Treating RN: Ahmed Prima Primary Care  Physician: [name] Other Clinician: Referring  Physician: [name] Treating Physician/Extender: [name] in Treatment: 3 Education Assessment Education Provided To: Patient Education Topics Provided Wound/Skin Impairment: Handouts: Other: change dressing as ordered Methods: Demonstration, Explain/Verbal Responses: State content correctly Electronic Signature(s) Signed: 06/20/2016 4:03:50 PM By: Alric Quan Entered By: Alric Quan on 06/19/2016 15:34:59 [name] ([id number]) -------------------------------------------------------------------------------- Wound Assessment Details Patient Name: AARYAN, CONGDON. Date of Service: 06/19/2016 3:30 PM Medical Record Number: [id number] Patient Account Number: [id number] Date of Birth/Sex: Mar 31, 1935 (82 y.o. Male) Treating RN: Ahmed Prima Primary Care Kalon Erhardt: [name] Other Clinician: Referring Teodora Baumgarten: [name] Treating Damain Broadus/Extender: [name] in Treatment: 3 Wound Status Wound Number: 1 Primary Pressure Ulcer Etiology: Wound Location: Left Malleolus - Lateral Wound Open Wounding Event: Pressure Injury Status: Date Acquired: 05/01/2016 Comorbid Cataracts, Anemia, Sleep Apnea, Weeks Of Treatment: 3 History: Arrhythmia, Hypertension, Dementia Clustered Wound: No Photos Photo Uploaded By: Alric Quan on 06/19/2016 16:13:17 Wound Measurements Length: (cm) 0.4 Width: (cm) 0.5 Depth: (cm) 0.2 Area: (cm) 0.157 Volume: (cm) 0.031 % Reduction in Area: 19.9% % Reduction in Volume: -55% Epithelialization: None Tunneling: No Undermining: No Wound Description Classification: Category/Stage II Foul Odor Afte Wound Margin: Distinct, outline attached Slough/Fibrino Exudate Amount: Large Exudate Type: Serous Exudate Color: amber r Cleansing: No No Wound Bed Granulation Amount: None Present (0%) Exposed Structure Necrotic Amount: Large  (67-100%) Fat Layer (Subcutaneous Tissue) Exposed: Yes Necrotic Quality: Eschar, Adherent [address] BUREL, LINDSTROM. ([id number]) Texture Color No Abnormalities Noted: No No Abnormalities Noted: No Erythema: Yes Moisture Erythema Location: Circumferential No Abnormalities Noted: No Temperature / Pain Temperature: No Abnormality Tenderness on Palpation: Yes Wound Preparation Ulcer Cleansing: Rinsed/Irrigated with Saline Topical Anesthetic Applied: Other: lidocaine 4%, Treatment Notes Wound #1 (Left, Lateral Malleolus) 1. Cleansed with: Clean wound with Normal Saline 2. Anesthetic Topical Lidocaine 4% cream to wound bed prior to debridement 4. Dressing Applied: Santyl Ointment 5. Secondary Dressing Applied Bordered Foam Dressing Dry Gauze Electronic Signature(s) Signed: 06/20/2016 4:03:50 PM By: Alric Quan Entered By: Alric Quan on 06/19/2016 15:22:55 [name] ([id number]) -------------------------------------------------------------------------------- [place] Details Patient Name: KADEN, JOHL. Date of Service: 06/19/2016 3:30 PM Medical Record Number: [id number] Patient Account Number: [id number] Date of Birth/Sex: 08/04/1934 (83 y.o. Male) Treating RN: Ahmed Prima Primary Care Hester Joslin: [name] Other Clinician: Referring Teara Duerksen: [name] Treating Biannca Scantlin/Extender: [name] in Treatment: 3 Vital Signs Time Taken: 15:18 Temperature (F): 97.7 Height (in): 69 Pulse (bpm): 62 Weight (lbs): 174.8 Respiratory Rate (breaths/min): 16 Body Mass Index (BMI): 25.8 Blood Pressure (mmHg): 133/58 Reference Range: 80 - 120 mg / dl Electronic Signature(s) Signed: 06/20/2016 4:03:50 PM By: Alric Quan Entered By: Alric Quan on 06/19/2016 15:18:56

## 2016-06-21 NOTE — Progress Notes (Signed)
KAMYRON, GEORGER ([id number]) Visit Report for 06/19/2016 Chief Complaint Document Details Patient Name: [name], [name]. Date of Service: 06/19/2016 3:30 PM Medical Record Number: [id number] Patient Account Number: [id number] Date of Birth/Sex: 06-09-1934 (82 y.o. Male) Treating RN: Ahmed Prima Primary Care Provider: [name] Other Clinician: Referring Provider: [name] Treating Provider/Extender: [name] in Treatment: 3 Information Obtained from: Patient Chief Complaint Patient is at the clinic for treatment of an open pressure ulcer to the left ankle which she's had for about 5 weeks Electronic Signature(s) Signed: 06/19/2016 3:49:20 PM By: Christin Fudge MD, FACS Entered By: Christin Fudge on 06/19/2016 15:49:20 Jared Emmer ([id number]) -------------------------------------------------------------------------------- Debridement Details Patient Name: [name], [name]. Date of Service: 06/19/2016 3:30 PM Medical Record Number: [id number] Patient Account Number: [id number] Date of Birth/Sex: 08/27/1934 (82 y.o. Male) Treating RN: Ahmed Prima Primary Care Provider: [name] Other Clinician: Referring Provider: [name] Treating Provider/Extender: [name] in Treatment: 3 Debridement Performed for Wound #1 Left,Lateral Malleolus Assessment: Performed By: Physician Christin Fudge, MD Debridement: Debridement Pre-procedure Yes - 15:28 Verification/Time Out Taken: Start Time: 15:29 Pain Control: Lidocaine 4% Topical Solution Level: Skin/Subcutaneous Tissue Total Area Debrided (L x 0.4 (cm) x 0.5 (cm) = 0.2 (cm) W): Tissue and other Viable, Non-Viable, Exudate, Fibrin/Slough, Subcutaneous material debrided: Instrument: Forceps Bleeding: Minimum Hemostasis Achieved: Pressure End Time: 15:33 Procedural Pain: 0 Post Procedural Pain: 0 Response to Treatment: Procedure was tolerated well Post Debridement  Measurements of Total Wound Length: (cm) 0.4 Stage: Category/Stage II Width: (cm) 0.5 Depth: (cm) 0.2 Volume: (cm) 0.031 Character of Wound/Ulcer Post Requires Further Debridement: Debridement Severity of Tissue Post Fat layer exposed Debridement: Post Procedure Diagnosis Same as Pre-procedure Electronic Signature(s) Signed: 06/19/2016 3:49:14 PM By: Christin Fudge MD, FACS Signed: 06/20/2016 4:03:50 PM By: Alric Quan Entered By: Christin Fudge on 06/19/2016 15:49:14 Jared Emmer ([id number]) [name], [name]. ([id number]) -------------------------------------------------------------------------------- HPI Details Patient Name: [name], [name]. Date of Service: 06/19/2016 3:30 PM Medical Record Number: [id number] Patient Account Number: [id number] Date of Birth/Sex: 1934-09-12 (83 y.o. Male) Treating RN: Ahmed Prima Primary Care Provider: [name] Other Clinician: Referring Provider: [name] Treating Provider/Extender: [name] in Treatment: 3 History of Present Illness Location: left ankle laterally Quality: Patient reports experiencing a dull pain to affected area(s). Severity: Patient states wound are getting worse. Duration: Patient has had the wound for < 5 weeks prior to presenting for treatment Timing: Pain in wound is Intermittent (comes and goes Context: The wound appeared gradually over time Modifying Factors: Other treatment(s) tried include:put on Bactroban and doxycycline Associated Signs and Symptoms: Patient reports having difficulty standing for long periods. HPI Description: 82 year old gentleman who is known to have a history of dementia but is fairly functional and is able to ambulate has been having a pressure ulcer to the left ankle due to laying in bed for longer than he should. Past history of atrial fibrillation, dementia, hyperlipidemia, hypertension. He's also had some orthopedic related surgeries in the past and  was a former smoker and is given up in 1969. Recently seen by his PCP who put him on doxycycline and asked him to apply Bactroban and see [place] at the wound clinic. 06/05/2016 -- had a x-ray of the left ankle which showed no acute bony pathology. Electronic Signature(s) Signed: 06/19/2016 3:49:25 PM By: Christin Fudge MD, FACS Entered By: Christin Fudge on 06/19/2016 15:49:24 Jared Emmer ([id number]) -------------------------------------------------------------------------------- Physical Exam Details Patient Name: [name], Jared  M. Date of Service: 06/19/2016 3:30 PM Medical Record Number: [id number] Patient Account Number: [id number] Date of Birth/Sex: 03/17/1935 (82 y.o. Male) Treating RN: Ahmed Prima Primary Care Provider: [name] Other Clinician: Referring Provider: [name] Treating Provider/Extender: [name] in Treatment: 3 Constitutional . Pulse regular. Respirations normal and unlabored. Afebrile. . Eyes Nonicteric. Reactive to light. Ears, Nose, Mouth, and Throat Lips, teeth, and gums WNL.Marland Kitchen Moist mucosa without lesions. Neck supple and nontender. No palpable supraclavicular or cervical adenopathy. Normal sized without goiter. Respiratory WNL. No retractions.. Breath sounds WNL, No rubs, rales, rhonchi, or wheeze.. Cardiovascular Heart rhythm and rate regular, no murmur or gallop.. Pedal Pulses WNL. No clubbing, cyanosis or edema. Chest Breasts symmetical and no nipple discharge.. Breast tissue WNL, no masses, lumps, or tenderness.. Lymphatic No adneopathy. No adenopathy. No adenopathy. Musculoskeletal Adexa without tenderness or enlargement.. Digits and nails w/o clubbing, cyanosis, infection, petechiae, ischemia, or inflammatory conditions.. Integumentary (Hair, Skin) No suspicious lesions. No crepitus or fluctuance. No peri-wound warmth or erythema. No masses.Marland Kitchen Psychiatric Judgement and insight Intact.. No evidence of depression,  anxiety, or agitation.. Notes I will try to sharply debrided some of the subcutaneous debris with toothed forcep and minimal bleeding controlled with pressure Electronic Signature(s) Signed: 06/19/2016 3:49:48 PM By: Christin Fudge MD, FACS Entered By: Christin Fudge on 06/19/2016 15:49:48 Jared Emmer ([id number]) -------------------------------------------------------------------------------- Physician Orders Details Patient Name: [name], [name]. Date of Service: 06/19/2016 3:30 PM Medical Record Number: [id number] Patient Account Number: [id number] Date of Birth/Sex: Apr 20, 1935 (82 y.o. Male) Treating RN: Ahmed Prima Primary Care Provider: [name] Other Clinician: Referring Provider: [name] Treating Provider/Extender: [name] in Treatment: 3 Verbal / Phone Orders: Yes Clinician: Pinkerton, Debi Read Back and Verified: Yes Diagnosis Coding Wound Cleansing Wound #1 Left,Lateral Malleolus o Clean wound with Normal Saline. o May Shower, gently pat wound dry prior to applying new dressing. Anesthetic Wound #1 Left,Lateral Malleolus o Topical Lidocaine 4% cream applied to wound bed prior to debridement - for clinic use Skin Barriers/Peri-Wound Care Wound #1 Left,Lateral Malleolus o Skin Prep Primary Wound Dressing Wound #1 Left,Lateral Malleolus o Santyl Ointment Secondary Dressing Wound #1 Left,Lateral Malleolus o Dry Gauze o Boardered Foam Dressing Follow-up Appointments Wound #1 Left,Lateral Malleolus o Return Appointment in 1 week. Edema Control Wound #1 Left,Lateral Malleolus o Elevate legs to the level of the heart and pump ankles as often as possible Off-Loading Wound #1 Left,Lateral Malleolus o Turn and reposition every 2 hours o Other: - keep pressure off of affected area [name], [name]. ([id number]) Additional Orders / Instructions Wound #1 Left,Lateral Malleolus o Increase protein  intake. Medications-please add to medication list. Wound #1 Left,Lateral Malleolus o Other: - Vitamin A, Vitamin C, Zinc, MVI Patient Medications Allergies: morphine, tramadol, soy protein Notifications Medication Indication Start End Santyl 06/19/2016 DOSE topical 250 unit/gram ointment - ointment topical as directed Electronic Signature(s) Signed: 06/19/2016 3:48:32 PM By: Christin Fudge MD, FACS Entered By: Christin Fudge on 06/19/2016 15:48:31 Jared Emmer ([id number]) -------------------------------------------------------------------------------- Progress Note Details Patient Name: Jared Emmer. Date of Service: 06/19/2016 3:30 PM Medical Record Number: [id number] Patient Account Number: [id number] Date of Birth/Sex: [birth year] (82 y.o. Male) Treating RN: Ahmed Prima Primary Care Provider: [name] Other Clinician: Referring Provider: [name] Treating Provider/Extender: [name] in Treatment: 3 Subjective Chief Complaint Information obtained from Patient Patient is at the clinic for treatment of an open pressure ulcer to the left ankle which she's had for  about 5 weeks History of Present Illness (HPI) The following HPI elements were documented for the patient's wound: Location: left ankle laterally Quality: Patient reports experiencing a dull pain to affected area(s). Severity: Patient states wound are getting worse. Duration: Patient has had the wound for < 5 weeks prior to presenting for treatment Timing: Pain in wound is Intermittent (comes and goes Context: The wound appeared gradually over time Modifying Factors: Other treatment(s) tried include:put on Bactroban and doxycycline Associated Signs and Symptoms: Patient reports having difficulty standing for long periods. 82 year old gentleman who is known to have a history of dementia but is fairly functional and is able to ambulate has been having a pressure ulcer to the left ankle  due to laying in bed for longer than he should. Past history of atrial fibrillation, dementia, hyperlipidemia, hypertension. He's also had some orthopedic related surgeries in the past and was a former smoker and is given up in 1969. Recently seen by his PCP who put him on doxycycline and asked him to apply Bactroban and see [place] at the wound clinic. 06/05/2016 -- had a x-ray of the left ankle which showed no acute bony pathology. Objective Constitutional Pulse regular. Respirations normal and unlabored. Afebrile. Vitals Time Taken: 3:18 PM, Height: 69 in, Weight: 174.8 lbs, BMI: 25.8, Temperature: 97.7 F, Pulse: 62 bpm, Respiratory Rate: 16 breaths/min, Blood Pressure: 133/58 mmHg. [name], [name] ([id number]) Eyes Nonicteric. Reactive to light. Ears, Nose, Mouth, and Throat Lips, teeth, and gums WNL.Marland Kitchen Moist mucosa without lesions. Neck supple and nontender. No palpable supraclavicular or cervical adenopathy. Normal sized without goiter. Respiratory WNL. No retractions.. Breath sounds WNL, No rubs, rales, rhonchi, or wheeze.. Cardiovascular Heart rhythm and rate regular, no murmur or gallop.. Pedal Pulses WNL. No clubbing, cyanosis or edema. Chest Breasts symmetical and no nipple discharge.. Breast tissue WNL, no masses, lumps, or tenderness.. Lymphatic No adneopathy. No adenopathy. No adenopathy. Musculoskeletal Adexa without tenderness or enlargement.. Digits and nails w/o clubbing, cyanosis, infection, petechiae, ischemia, or inflammatory conditions.Marland Kitchen Psychiatric Judgement and insight Intact.. No evidence of depression, anxiety, or agitation.. General Notes: I will try to sharply debrided some of the subcutaneous debris with toothed forcep and minimal bleeding controlled with pressure Integumentary (Hair, Skin) No suspicious lesions. No crepitus or fluctuance. No peri-wound warmth or erythema. No masses.. Wound #1 status is Open. Original cause of wound was Pressure Injury. The  wound is located on the Left,Lateral Malleolus. The wound measures 0.4cm length x 0.5cm width x 0.2cm depth; 0.157cm^2 area and 0.031cm^3 volume. There is Fat Layer (Subcutaneous Tissue) Exposed exposed. There is no tunneling or undermining noted. There is a large amount of serous drainage noted. The wound margin is distinct with the outline attached to the wound base. There is no granulation within the wound bed. There is a large (67- 100%) amount of necrotic tissue within the wound bed including Eschar and Adherent Slough. The periwound skin appearance exhibited: Erythema. The surrounding wound skin color is noted with erythema which is circumferential. Periwound temperature was noted as No Abnormality. The periwound has tenderness on palpation. Procedures [name], [name] ([id number]) Wound #1 Wound #1 is a Pressure Ulcer located on the Left,Lateral Malleolus . There was a Skin/Subcutaneous Tissue Debridement [id number]) debridement with total area of 0.2 sq cm performed by Christin Fudge, MD. with the following instrument(s): Forceps to remove Viable and Non-Viable tissue/material including Exudate, Fibrin/Slough, and Subcutaneous after achieving pain control using Lidocaine 4% Topical Solution. A time out was conducted at 15:28, prior to  the start of the procedure. A Minimum amount of bleeding was controlled with Pressure. The procedure was tolerated well with a pain level of 0 throughout and a pain level of 0 following the procedure. Post Debridement Measurements: 0.4cm length x 0.5cm width x 0.2cm depth; 0.031cm^3 volume. Post debridement Stage noted as Category/Stage II. Character of Wound/Ulcer Post Debridement requires further debridement. Severity of Tissue Post Debridement is: Fat layer exposed. Post procedure Diagnosis Wound #1: Same as Pre-Procedure Plan Wound Cleansing: Wound #1 Left,Lateral Malleolus: Clean wound with Normal Saline. May Shower, gently pat wound dry prior  to applying new dressing. Anesthetic: Wound #1 Left,Lateral Malleolus: Topical Lidocaine 4% cream applied to wound bed prior to debridement - for clinic use Skin Barriers/Peri-Wound Care: Wound #1 Left,Lateral Malleolus: Skin Prep Primary Wound Dressing: Wound #1 Left,Lateral Malleolus: Santyl Ointment Secondary Dressing: Wound #1 Left,Lateral Malleolus: Dry Gauze Boardered Foam Dressing Follow-up Appointments: Wound #1 Left,Lateral Malleolus: Return Appointment in 1 week. Edema Control: Wound #1 Left,Lateral Malleolus: Elevate legs to the level of the heart and pump ankles as often as possible Off-Loading: Wound #1 Left,Lateral Malleolus: Turn and reposition every 2 hours Other: - keep pressure off of affected area Additional Orders / Instructions: Wound #1 Left,Lateral Malleolus: [name], HANDCOCK. ([id number]) Increase protein intake. Medications-please add to medication list.: Wound #1 Left,Lateral Malleolus: Other: - Vitamin A, Vitamin C, Zinc, MVI The following medication(s) was prescribed: Santyl topical 250 unit/gram ointment ointment topical as directed starting 06/19/2016 I have recommended: 1. Santyl ointment to be applied daily, and a protective bordered foam 2. Off-Loading has been discussed in great detail 3. adequate protein, vitamin A, vitamin C and zinc 4. Regular visits to the wound center Electronic Signature(s) Signed: 06/19/2016 3:50:32 PM By: Christin Fudge MD, FACS Entered By: Christin Fudge on 06/19/2016 15:50:32 Jared Emmer ([id number]) -------------------------------------------------------------------------------- SuperBill Details Patient Name: REDA, MANZOOR. Date of Service: 06/19/2016 Medical Record Number: [id number] Patient Account Number: [id number] Date of Birth/Sex: 1935/04/04 (82 y.o. Male) Treating RN: Ahmed Prima Primary Care Provider: [name] Other Clinician: Referring Provider: [name] Treating  Provider/Extender: Christin Fudge Service Line: Outpatient Weeks in Treatment: 3 Diagnosis Coding ICD-10 Codes Code Description [phone] Pressure ulcer of left ankle, stage 2 F02.80 Dementia in other diseases classified elsewhere without behavioral disturbance Facility Procedures CPT4: Description Modifier Quantity Code [id number] - DEB SUBQ TISSUE 20 SQ CM/< 1 ICD-10 Description Diagnosis L89.522 Pressure ulcer of left ankle, stage 2 F02.80 Dementia in other diseases classified elsewhere without behavioral disturbance Physician Procedures CPT4: Description Modifier Quantity Code [id number] - WC PHYS SUBQ TISS 20 SQ CM 1 ICD-10 Description Diagnosis L89.522 Pressure ulcer of left ankle, stage 2 F02.80 Dementia in other diseases classified elsewhere without behavioral disturbance Electronic Signature(s) Signed: 06/19/2016 3:50:43 PM By: Christin Fudge MD, FACS Entered By: Christin Fudge on 06/19/2016 15:50:42

## 2016-06-23 DIAGNOSIS — Z9989 Dependence on other enabling machines and devices: Secondary | ICD-10-CM | POA: Diagnosis not present

## 2016-06-23 DIAGNOSIS — G4733 Obstructive sleep apnea (adult) (pediatric): Secondary | ICD-10-CM | POA: Diagnosis not present

## 2016-06-23 DIAGNOSIS — I482 Chronic atrial fibrillation: Secondary | ICD-10-CM | POA: Diagnosis not present

## 2016-06-23 DIAGNOSIS — F0390 Unspecified dementia without behavioral disturbance: Secondary | ICD-10-CM | POA: Diagnosis not present

## 2016-06-23 DIAGNOSIS — I779 Disorder of arteries and arterioles, unspecified: Secondary | ICD-10-CM | POA: Diagnosis not present

## 2016-06-23 DIAGNOSIS — I10 Essential (primary) hypertension: Secondary | ICD-10-CM | POA: Diagnosis not present

## 2016-06-24 ENCOUNTER — Ambulatory Visit: Payer: PPO | Admitting: Physical Therapy

## 2016-06-24 ENCOUNTER — Encounter: Payer: Self-pay | Admitting: Physical Therapy

## 2016-06-24 DIAGNOSIS — R296 Repeated falls: Secondary | ICD-10-CM

## 2016-06-24 DIAGNOSIS — M6281 Muscle weakness (generalized): Secondary | ICD-10-CM | POA: Diagnosis not present

## 2016-06-24 DIAGNOSIS — R2689 Other abnormalities of gait and mobility: Secondary | ICD-10-CM

## 2016-06-24 DIAGNOSIS — R293 Abnormal posture: Secondary | ICD-10-CM

## 2016-06-24 NOTE — Therapy (Signed)
[facility] [address] [place], [place], [address] Phone: [phone]   Fax:  [phone]  Physical Therapy Treatment  Patient Details  Name: [name] MRN: [id number] Date of Birth: [birth year] Referring Provider: Jennings Books, MD  Encounter Date: 06/24/2016      PT End of Session - 06/24/16 1439    Visit Number 4   Number of Visits 8   Date for PT Re-Evaluation 07/10/16   Authorization - Visit Number 4   Authorization - Number of Visits 10   PT Start Time [id number]   PT Stop Time [id number]   PT Time Calculation (min) 60 min   Equipment Utilized During Treatment Gait belt   Activity Tolerance Patient tolerated treatment well   Behavior During Therapy [facility] for tasks assessed/performed      Past Medical History:  Diagnosis Date  . Absolute anemia 09/11/2014  . Anxiety   . Atrial fibrillation ([place]) 01/23/2014  . BP (high blood pressure) 11/07/2013  . Carotid artery disease ([place]) 09/11/2014  . Complete rotator cuff rupture of left shoulder 12/30/2013  . Dementia 09/11/2014  . Depression   . Heart murmur   . HLD (hyperlipidemia) 01/23/2014  . Hypertension   . Obstructive apnea 11/07/2013  . Personal history of other diseases of the circulatory system 01/23/2014  . Sleep apnea   . 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 Back Surgery- Tumor Removal    . SHOULDER SURGERY Right 2007    There were no vitals filed for this visit.      Subjective Assessment - 06/24/16 1437    Subjective Patient has been having some more frequent low back pain. Patient's wife states that he sometimes forgets HEP and she has to remind him.    Patient is accompained by: Family member   Pertinent History Hx of left knee pain, low back pain, shoulder pain. Hx of falls, dementia, and teretoma of the spine   Limitations Sitting;Lifting;Standing;Walking;House hold activities   Diagnostic tests BERG    Patient Stated Goals get stronger, pick up legs better when walking, getting up easier and faster   Currently in Pain? Yes   Pain Score 5    Pain Location Back   Pain Orientation Lower   Pain Descriptors / Indicators Aching;Discomfort   Pain Type Chronic pain   Pain Frequency Occasional      Neuro Re-ed Interventions based on LSVT Big protocol for large motions and upright posture: Big arms during walking (LSVT BIG style) 4x~50 ft . Frequent cues for continuation  Seated exercises:  -Hands on knees bring out into abduction with palms forward, raise arms and then return to hands on knees. 2x10. Utilized to open patient's posture and increase trunk and UE ROM.  -Reaching arm across body turning head to look at arm and twisting trunk. Return to hands on knees. 2x10 both sides. Increase patient trunk ROM for improved body mechanics.  Forward step and reach with big arms (one forward and one back) and return to standing with clap when feet return together. 1x10 each leg.  Seated hamstring stretch 1x60 seconds  Gait: -ambulating between parallel bars without UE use with cues for posture and gait mechanics. . -Walking ~ 40 ft negotiating changing surfaces from stable to dynamic without LOB x6. -Walking ~40 ft negotiating step up and changing surfaces onto 5 in mat 4x without LOB. Patient got nervous  getting closer to mat and started decreasing step amplitude. -big steps during walking (LSVT BIG style) 4x ~50 ft. . Frequent cues for continuation.   Manual:   knees to chest 1x60 seconds PROM: Hamstrings: straight leg raise (60 seconds), popliteal angle. Piriformis, Abductors, Adductors, Gastroc, and soleus. Hold 30 seconds.      Pt response to medical necessity: Patient will benefit from skilled physical therapy to improve gait mechanics, balance, decrease falls, improve gross strength and posture.          PT Education - 06/24/16 1438    Education provided Yes   Education Details  Hamstring stretch in seated, use of belt with hamstring stretch to stretch calves. Knees to chest stretch for back.    Person(s) Educated Patient   Methods Explanation;Demonstration   Comprehension Verbalized understanding;Returned demonstration             PT Long Term Goals - 06/12/16 1815      PT LONG TERM GOAL #1   Title Patient will increase score on Berg Balance to 49/56 for decreased fall risk.    Baseline 2/8: 44/56   Time 4   Period Weeks   Status New     PT LONG TERM GOAL #2   Title Patient will have gross 4/5 bilateral LE strength to increase ability to perform functional activities.    Baseline Patient has a gross LLE 3+/5, RLE 4-/5   Time 4   Period Weeks   Status New     PT LONG TERM GOAL #3   Title Patient will ambulate 5 minutes with a cane without LOB to increase community mobility.     Baseline Patient fatigues quickly.    Time 4   Period Weeks   Status New     PT LONG TERM GOAL #4   Title Patient will perform 10 sit to stands without use of hands or LOB to demonstrate improved capacity for functional activities.    Baseline Patient can perform 1 sit to stand without hands.    Time 4   Period Weeks   Status New            Plan - 06/24/16 1441    Clinical Impression Statement Patient demonstrated carryover from previous session with interventions implementing increased arm swing. Patient continues to struggle carrying over larger amplitude steps from interventions but is demonstrating some progress.  Short term memory loss is a limiting factor and patient required frequent task orientation cues.  Patient has low back pain affected by tight posterior LE musculature and global weakness. Patient continues to require frequent cueing for upright posture and will benefit from continued interventions that implement LSVT principles for large motions and upright posture.  Patient's ability to negotiate obstacles while ambulating is improving and patient was  able to change surface from stable to unstable without LOB. Patient will benefit from continued skilled physical therapy to decrease falls risk, improve gross strengthening, ROM,  pain control, and functional posture to increase interaction in the community and home.    Rehab Potential Fair   Clinical Impairments Affecting Rehab Potential hx of falls, dementia, teratoma, mitral valve polapse, cancer, a fib, L knee injury, L shoulder, back pain   PT Frequency 2x / week   PT Duration 4 weeks   PT Treatment/Interventions ADLs/Self Care Home Management;Aquatic Therapy;Cryotherapy;Electrical Stimulation;Biofeedback;Moist Heat;DME Instruction;Gait training;Stair training;Functional mobility training;Therapeutic activities;Therapeutic exercise;Balance training;Neuromuscular re-education;Patient/family education;Orthotic Fit/Training;Manual techniques;Passive range of motion;Dry needling;Energy conservation   PT Next Visit Plan talk to wife  about things to work on in the home.    PT Home Exercise Plan see sheet   Consulted and Agree with Plan of Care Patient;Family member/caregiver   Family Member Consulted wife      Patient will benefit from skilled therapeutic intervention in order to improve the following deficits and impairments:  Abnormal gait, Decreased activity tolerance, Decreased balance, Decreased mobility, Decreased knowledge of use of DME, Decreased endurance, Decreased range of motion, Decreased safety awareness, Decreased skin integrity, Decreased knowledge of precautions, Decreased strength, Hypomobility, Difficulty walking, Impaired flexibility, Postural dysfunction, Improper body mechanics, Decreased cognition  Visit Diagnosis: Muscle weakness (generalized)  Other abnormalities of gait and mobility  Repeated falls  Abnormal posture     Problem List Patient Active Problem List   Diagnosis Date Noted  . Fall 02/18/2016  . Scalp laceration 02/18/2016  . Splenic laceration  02/18/2016  . Acute blood loss anemia 02/18/2016  . Multiple rib fractures 02/16/2016  . Mild dementia 04/10/2015  . Aggrieved 03/02/2015  . Absolute anemia 09/11/2014  . Carotid artery disease ([place]) 09/11/2014  . Dementia 09/11/2014  . History of repair of rotator cuff 03/02/2014  . Atrial fibrillation with RVR ([place]) 01/23/2014  . Personal history of other diseases of the circulatory system 01/23/2014  . HLD (hyperlipidemia) 01/23/2014  . Complete rotator cuff rupture of left shoulder 12/30/2013  . BP (high blood pressure) 11/07/2013  . Amnesia 11/07/2013  . Obstructive apnea 11/07/2013   [name], PT, DPT # [id number] Janna Arch, SPT 06/24/2016, 2:47 PM  [place] [facility] [address] [place], [place], [address] Phone: [phone]   Fax:  [phone]  Name: KASPAR USELTON MRN: [id number] Date of Birth: [birth year]

## 2016-06-25 ENCOUNTER — Encounter: Payer: Self-pay | Admitting: Physical Therapy

## 2016-06-26 ENCOUNTER — Ambulatory Visit: Payer: PPO | Admitting: Physical Therapy

## 2016-06-26 ENCOUNTER — Encounter: Payer: PPO | Admitting: Surgery

## 2016-06-26 DIAGNOSIS — M6281 Muscle weakness (generalized): Secondary | ICD-10-CM | POA: Diagnosis not present

## 2016-06-26 DIAGNOSIS — L89520 Pressure ulcer of left ankle, unstageable: Secondary | ICD-10-CM | POA: Diagnosis not present

## 2016-06-26 DIAGNOSIS — R2689 Other abnormalities of gait and mobility: Secondary | ICD-10-CM

## 2016-06-26 DIAGNOSIS — R296 Repeated falls: Secondary | ICD-10-CM

## 2016-06-26 DIAGNOSIS — L89522 Pressure ulcer of left ankle, stage 2: Secondary | ICD-10-CM | POA: Diagnosis not present

## 2016-06-26 DIAGNOSIS — R293 Abnormal posture: Secondary | ICD-10-CM

## 2016-06-26 NOTE — Therapy (Signed)
[facility] [address] [place], [place], [address] Phone: [phone]   Fax:  [phone]  Physical Therapy Treatment  Patient Details  Name: [name] MRN: [id number] Date of Birth: 1934-08-01 Referring Provider: Jennings Books, MD  Encounter Date: 06/26/2016      PT End of Session - 06/26/16 1358    Visit Number 5   Number of Visits 8   Date for PT Re-Evaluation 07/10/16   Authorization - Visit Number 5   Authorization - Number of Visits 10   PT Start Time [id number]   PT Stop Time [id number]   PT Time Calculation (min) 65 min   Equipment Utilized During Treatment Gait belt   Activity Tolerance Patient tolerated treatment well   Behavior During Therapy [facility] for tasks assessed/performed      Past Medical History:  Diagnosis Date  . Absolute anemia 09/11/2014  . Anxiety   . Atrial fibrillation ([place]) 01/23/2014  . BP (high blood pressure) 11/07/2013  . Carotid artery disease ([place]) 09/11/2014  . Complete rotator cuff rupture of left shoulder 12/30/2013  . Dementia 09/11/2014  . Depression   . Heart murmur   . HLD (hyperlipidemia) 01/23/2014  . Hypertension   . Obstructive apnea 11/07/2013  . Personal history of other diseases of the circulatory system 01/23/2014  . Sleep apnea   . 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 Back Surgery- Tumor Removal    . SHOULDER SURGERY Right 2007    There were no vitals filed for this visit.      Subjective Assessment - 06/26/16 1356    Subjective Patient has appointment with wound care this afternoon for his ulcer on his left ankle. Upon being asked, patient's wife reports the patient struggles the most at home with balance tasks such as showering where he feels unsteady.    Patient is accompained by: Family member   Pertinent History Hx of left knee pain, low back pain, shoulder pain. Hx of falls, dementia, and teretoma of the spine   Limitations Sitting;Lifting;Standing;Walking;House hold activities   Diagnostic tests BERG   Patient Stated Goals get stronger, pick up legs better when walking, getting up easier and faster   Currently in Pain? No/denies      Neuro Re-ed Interventions based on LSVT Big protocol for large motions and upright posture: Forward step and reach with big arms (one forward and one back) and return to standing  2x10 each leg in // bars. Tactile cueing for task orientation. Big arms during walking (LSVT BIG style) 4x in // bars and 2x in clinic . Frequent cues for continuation  Step forward clap and return to starting position in // bars 10x each leg.  Seated exercises:  -Hands on knees bring out into abduction with palms forward, raise arms and then return to hands on knees. 2x10. Utilized to open patient's posture and increase trunk and UE ROM.  -Reaching arm across body turning head to look at arm and twisting trunk. Return to hands on knees. 2x10 both sides. Increase patient trunk ROM for improved body mechanics.  Throwing ball with wife with CGA/SBA 2x10. Good control , no LOB Standing on Airex Pad 2x30 seconds feet normal, occasional UE assist, Tandem stance 2x30 seconds frequent UE assitance.   TherEx: Marching with UE assistance at bar 2x10. Cues for upright posture Thoracic rotations with UE reach in seated 10x each  side Single arm flexion in seated 10x each arm.  Sit to stand ambulating between chairs 58ft apart. 6x. Cues for feeling chair behind both legs prior to sitting down.   Gait: -ambulating between parallel bars without UE use with cues for posture and gait mechanics. . -Walking ~ 40 ft negotiating changing surfaces from stable to dynamic without LOB x6. -Walking ~40 ft negotiating step up and changing surfaces onto 5 in mat 4x without LOB. Patient got nervous getting closer to mat and started decreasing step amplitude. -big steps during walking (LSVT BIG style) 4x ~50 ft. .  Frequent cues for continuation.     Pt response to medical necessity: Patient will benefit from skilled physical therapy to improve gait mechanics, balance, decrease falls, improve gross strength and posture.          PT Education - 06/26/16 1358    Education provided Yes   Education Details marching at chair with upright posture   Person(s) Educated Patient;Spouse   Methods Explanation;Demonstration   Comprehension Verbalized understanding;Returned demonstration             PT Long Term Goals - 06/26/16 1415      PT LONG TERM GOAL #1   Title Patient will increase score on Berg Balance to 49/56 for decreased fall risk.    Baseline 2/8: 44/56   Time 4   Period Weeks   Status New     PT LONG TERM GOAL #2   Title Patient will have gross 4/5 bilateral LE strength to increase ability to perform functional activities.    Baseline Patient has a gross LLE 3+/5, RLE 4-/5   Time 4   Period Weeks   Status New     PT LONG TERM GOAL #3   Title Patient will ambulate 5 minutes with a cane without LOB to increase community mobility.     Baseline Patient fatigues quickly.    Time 4   Period Weeks   Status New     PT LONG TERM GOAL #4   Title Patient will perform 10 sit to stands without use of hands or LOB to demonstrate improved capacity for functional activities.    Baseline Patient can perform 1 sit to stand without hands.    Time 4   Period Weeks   Status New     PT LONG TERM GOAL #5   Title Patient will have upright posture for 10 minutes to allow for better body mechanics and decreased back pain.    Baseline Patient requires max cueing for upright posture   Time 4   Period Weeks   Status New     Additional Long Term Goals   Additional Long Term Goals Yes     PT LONG TERM GOAL #6   Title Patient will not have SI/back pain while ambulating to allow for increased community mobility.    Baseline Patient has occasional 5/10 back pain while ambulating   Time 4    Period Weeks   Status New               Plan - 06/26/16 1359    Clinical Impression Statement  Patient presents to therapy session with continued balance deficits and back pain. Patient has demonstrated progress with balance tasks and was able to perform dynamic task of ball toss with wife without LOB. Unstable surfaces such as the Airex pad continues to challenge patient. Consistent verbal and tactile cues for correction of forward flexion continues to be necessary.  Patient is able to correct posture for short durations. Patient often sits sideways onto chair and was educated on feeling the chair behind both legs prior to starting to sit down. Patient demonstrated understanding after repetitions. Repetition of activities will continue to be key to the patient's success due to their short term memory. Patient's back pain is influenced by LE and abdominal weakness paired with poor posture and body mechanics. Patient will benefit from continued skilled physical therapy to decrease fall risk, improve gross strengthening, ROM, pain control, and functional posture to increase interactions with home and community environments.    Rehab Potential Fair   Clinical Impairments Affecting Rehab Potential hx of falls, dementia, teratoma, mitral valve polapse, cancer, a fib, L knee injury, L shoulder, back pain   PT Frequency 2x / week   PT Duration 4 weeks   PT Treatment/Interventions ADLs/Self Care Home Management;Aquatic Therapy;Cryotherapy;Electrical Stimulation;Biofeedback;Moist Heat;DME Instruction;Gait training;Stair training;Functional mobility training;Therapeutic activities;Therapeutic exercise;Balance training;Neuromuscular re-education;Patient/family education;Orthotic Fit/Training;Manual techniques;Passive range of motion;Dry needling;Energy conservation   PT Next Visit Plan BALANCE   PT Home Exercise Plan see sheet   Consulted and Agree with Plan of Care Patient;Family member/caregiver   Family  Member Consulted wife      Patient will benefit from skilled therapeutic intervention in order to improve the following deficits and impairments:  Abnormal gait, Decreased activity tolerance, Decreased balance, Decreased mobility, Decreased knowledge of use of DME, Decreased endurance, Decreased range of motion, Decreased safety awareness, Decreased skin integrity, Decreased knowledge of precautions, Decreased strength, Hypomobility, Difficulty walking, Impaired flexibility, Postural dysfunction, Improper body mechanics, Decreased cognition  Visit Diagnosis: Muscle weakness (generalized)  Other abnormalities of gait and mobility  Repeated falls  Abnormal posture     Problem List Patient Active Problem List   Diagnosis Date Noted  . Fall 02/18/2016  . Scalp laceration 02/18/2016  . Splenic laceration 02/18/2016  . Acute blood loss anemia 02/18/2016  . Multiple rib fractures 02/16/2016  . Mild dementia 04/10/2015  . Aggrieved 03/02/2015  . Absolute anemia 09/11/2014  . Carotid artery disease ([place]) 09/11/2014  . Dementia 09/11/2014  . History of repair of rotator cuff 03/02/2014  . Atrial fibrillation with RVR ([place]) 01/23/2014  . Personal history of other diseases of the circulatory system 01/23/2014  . HLD (hyperlipidemia) 01/23/2014  . Complete rotator cuff rupture of left shoulder 12/30/2013  . BP (high blood pressure) 11/07/2013  . Amnesia 11/07/2013  . Obstructive apnea 11/07/2013   Pura Spice, PT, DPT # [place], SPT 06/27/2016, 12:00 PM  [place] [facility] [address] [place], [place], [address] Phone: [phone]   Fax:  [phone]  Name: [name] MRN: [id number] Date of Birth: 04-06-1935

## 2016-06-28 NOTE — Progress Notes (Signed)
[name], [name] ([id number]) Visit Report for 06/26/2016 Arrival Information Details Patient Name: [name], [name]. Date of Service: 06/26/2016 2:30 PM Medical Record Number: [id number] Patient Account Number: [id number] Date of Birth/Sex: 1935/01/25 (82 y.o. Male) Treating RN: Ahmed Prima Primary Care Jayani Rozman: [name] Other Clinician: Referring Tanny Harnack: [name] Treating Tailor Westfall/Extender: [name] in Treatment: 4 Visit Information History Since Last Visit All ordered tests and consults were completed: No Patient Arrived: Ambulatory Added or deleted any medications: No Arrival Time: 14:25 Any new allergies or adverse reactions: No Accompanied By: WIFE Had a fall or experienced change in No Transfer Assistance: None activities of daily living that may affect Patient Requires Transmission-Based No risk of falls: Precautions: Signs or symptoms of abuse/neglect since last No Patient Has Alerts: Yes visito Patient Alerts: ASA Hospitalized since last visit: No Has Dressing in Place as Prescribed: Yes Pain Present Now: No Electronic Signature(s) Signed: 06/27/2016 4:53:33 PM By: Alric Quan Entered By: Alric Quan on 06/26/2016 14:27:05 [name] ([id number]) -------------------------------------------------------------------------------- Encounter Discharge Information Details Patient Name: [name], [name]. Date of Service: 06/26/2016 2:30 PM Medical Record Number: [id number] Patient Account Number: [id number] Date of Birth/Sex: 25-Nov-1934 (82 y.o. Male) Treating RN: Ahmed Prima Primary Care Sundra Haddix: [name] Other Clinician: Referring Iwao Shamblin: [name] Treating Maisee Vollman/Extender: [name] in Treatment: 4 Encounter Discharge Information Items Discharge Pain Level: 0 Discharge Condition: Stable Ambulatory Status: Ambulatory Discharge Destination: Home Transportation: Private Auto Accompanied  By: wife Schedule Follow-up Appointment: Yes Medication Reconciliation completed and provided to Patient/Care No Antanasia Kaczynski: Provided on Clinical Summary of Care: 06/26/2016 Form Type Recipient Paper Patient ET Electronic Signature(s) Signed: 06/26/2016 2:48:49 PM By: Ruthine Dose Entered By: Ruthine Dose on 06/26/2016 14:48:48 [name] ([id number]) -------------------------------------------------------------------------------- Lower Extremity Assessment Details Patient Name: [name]. Date of Service: 06/26/2016 2:30 PM Medical Record Number: [id number] Patient Account Number: [id number] Date of Birth/Sex: 1934-06-17 (82 y.o. Male) Treating RN: Ahmed Prima Primary Care Yukiko Minnich: [name] Other Clinician: Referring Onesti Bonfiglio: [name] Treating Chelbie Jarnagin/Extender: [name] in Treatment: 4 Vascular Assessment Pulses: Dorsalis Pedis Palpable: [Left:Yes] Posterior Tibial Extremity colors, hair growth, and conditions: Extremity Color: [Left:Normal] Temperature of Extremity: [Left:Warm] Capillary Refill: [Left:< 3 seconds] Electronic Signature(s) Signed: 06/27/2016 4:53:33 PM By: Alric Quan Entered By: Alric Quan on 06/26/2016 14:33:55 [name], [name] ([id number]) -------------------------------------------------------------------------------- Multi Wound Chart Details Patient Name: [name]. Date of Service: 06/26/2016 2:30 PM Medical Record Number: [id number] Patient Account Number: [id number] Date of Birth/Sex: 1935-05-02 (82 y.o. Male) Treating RN: Ahmed Prima Primary Care Treva Huyett: [name] Other Clinician: Referring Nesa Distel: [name] Treating Keysha Damewood/Extender: [name] in Treatment: 4 Vital Signs Height(in): 69 Pulse(bpm): 58 Weight(lbs): 174.8 Blood Pressure 135/61 (mmHg): Body Mass Index(BMI): 26 Temperature(F): 97.9 Respiratory Rate 16 (breaths/min): Photos:  [1:No Photos] [N/A:N/A] Wound Location: [1:Left Malleolus - Lateral] [N/A:N/A] Wounding Event: [1:Pressure Injury] [N/A:N/A] Primary Etiology: [1:Pressure Ulcer] [N/A:N/A] Comorbid History: [1:Cataracts, Anemia, Sleep Apnea, Arrhythmia, Hypertension, Dementia] [N/A:N/A] Date Acquired: [1:05/01/2016] [N/A:N/A] Weeks of Treatment: [1:4] [N/A:N/A] Wound Status: [1:Open] [N/A:N/A] Measurements L x W x D 0.3x0.3x0.2 [N/A:N/A] (cm) Area (cm) : [1:0.071] [N/A:N/A] Volume (cm) : [1:0.014] [N/A:N/A] % Reduction in Area: [1:63.80%] [N/A:N/A] % Reduction in Volume: 30.00% [N/A:N/A] Classification: [1:Category/Stage II] [N/A:N/A] Exudate Amount: [1:Large] [N/A:N/A] Exudate Type: [1:Serous] [N/A:N/A] Exudate Color: [1:amber] [N/A:N/A] Wound Margin: [1:Distinct, outline attached] [N/A:N/A] Granulation Amount: [1:None Present (0%)] [N/A:N/A] Necrotic Amount: [1:Large (67-100%)] [N/A:N/A] Necrotic Tissue: [1:Eschar, Adherent Slough] [N/A:N/A] Exposed Structures: [  1:Fat Layer (Subcutaneous Tissue) Exposed: Yes] [N/A:N/A] Epithelialization: [1:None] [N/A:N/A] Debridement: [1:Debridement [id number] [N/A:N/A] Pre-procedure 14:38 N/A N/A Verification/Time Out Taken: Pain Control: Lidocaine 4% Topical N/A N/A Solution Tissue Debrided: Fibrin/Slough, Exudates, N/A N/A Subcutaneous Level: Skin/Subcutaneous N/A N/A Tissue Debridement Area (sq 0.09 N/A N/A cm): Instrument: Curette N/A N/A Bleeding: Minimum N/A N/A Hemostasis Achieved: Pressure N/A N/A Procedural Pain: 0 N/A N/A Post Procedural Pain: 0 N/A N/A Debridement Treatment Procedure was tolerated N/A N/A Response: well Post Debridement 0.3x0.3x0.3 N/A N/A Measurements L x W x D (cm) Post Debridement 0.021 N/A N/A Volume: (cm) Post Debridement Category/Stage II N/A N/A Stage: Periwound Skin Texture: No Abnormalities Noted N/A N/A Periwound Skin No Abnormalities Noted N/A N/A Moisture: Periwound Skin Color: Erythema: Yes  N/A N/A Erythema Location: Circumferential N/A N/A Temperature: No Abnormality N/A N/A Tenderness on Yes N/A N/A Palpation: Wound Preparation: Ulcer Cleansing: N/A N/A Rinsed/Irrigated with Saline Topical Anesthetic Applied: Other: lidocaine 4% Procedures Performed: Debridement N/A N/A Treatment Notes Wound #1 (Left, Lateral Malleolus) 1. Cleansed with: Clean wound with Normal Saline 2. Anesthetic Topical Lidocaine 4% cream to wound bed prior to debridement 4. Dressing Applied: REGIS, M[place] ([id number]) Santyl Ointment 5. Secondary Dressing Applied Bordered Foam Dressing Dry Gauze Electronic Signature(s) Signed: 06/26/2016 3:13:40 PM By: Christin Fudge MD, FACS Entered By: Christin Fudge on 06/26/2016 15:13:40 [name] ([id number]) -------------------------------------------------------------------------------- Multi-Disciplinary Care Plan Details Patient Name: [name], [name]. Date of Service: 06/26/2016 2:30 PM Medical Record Number: [id number] Patient Account Number: [id number] Date of Birth/Sex: 20-Apr-1935 (82 y.o. Male) Treating RN: Ahmed Prima Primary Care Perris Tripathi: [name] Other Clinician: Referring Karon Heckendorn: [name] Treating Minnie Legros/Extender: [name] in Treatment: 4 Active Inactive ` Abuse / Safety / Falls / Self Care Management Nursing Diagnoses: Potential for falls Goals: Patient will remain injury free Date Initiated: 05/29/2016 Target Resolution Date: 08/02/2016 Goal Status: Active Interventions: Assess fall risk on admission and as needed Assess impairment of mobility on admission and as needed per policy Notes: ` Nutrition Nursing Diagnoses: Imbalanced nutrition Goals: Patient/caregiver agrees to and verbalizes understanding of need to use nutritional supplements and/or vitamins as prescribed Date Initiated: 05/29/2016 Target Resolution Date: 08/02/2016 Goal Status: Active Interventions: Assess  patient nutrition upon admission and as needed per policy Notes: ` Orientation to the Wound Care Program Nursing Diagnoses: [name], [name] ([id number]) Knowledge deficit related to the wound healing center program Goals: Patient/caregiver will verbalize understanding of the [facility] Date Initiated: 05/29/2016 Target Resolution Date: 06/14/2016 Goal Status: Active Interventions: Provide education on orientation to the wound center Notes: ` Pain, Acute or Chronic Nursing Diagnoses: Pain, acute or chronic: actual or potential Potential alteration in comfort, pain Goals: Patient will verbalize adequate pain control and receive pain control interventions during procedures as needed Date Initiated: 05/29/2016 Target Resolution Date: 08/02/2016 Goal Status: Active Interventions: Complete pain assessment as per visit requirements Notes: ` Wound/Skin Impairment Nursing Diagnoses: Impaired tissue integrity Knowledge deficit related to ulceration/compromised skin integrity Goals: Ulcer/skin breakdown will have a volume reduction of 80% by week 12 Date Initiated: 05/29/2016 Target Resolution Date: 08/02/2016 Goal Status: Active Interventions: Assess patient/caregiver ability to perform ulcer/skin care regimen upon admission and as needed Assess ulceration(s) every visit Notes: [name], [name] ([id number]) Electronic Signature(s) Signed: 06/27/2016 4:53:33 PM By: Alric Quan Entered By: Alric Quan on 06/26/2016 14:34:00 [name] ([id number]) -------------------------------------------------------------------------------- Pain Assessment Details Patient Name: [name]. Date of Service: 06/26/2016 2:30 PM Medical Record Number: [id number] Patient Account  Number: [id number] Date of Birth/Sex: 31-Jan-1935 (82 y.o. Male) Treating RN: Carolyne Fiscal, Debi Primary Care Susen Haskew: [name] Other Clinician: Referring Kristianna Saperstein: [name] Treating Harue Pribble/Extender: [name] in Treatment: 4 Active Problems Location of Pain Severity and Description of Pain Patient Has Paino No Site Locations With Dressing Change: No Pain Management and Medication Current Pain Management: Electronic Signature(s) Signed: 06/27/2016 4:53:33 PM By: Alric Quan Entered By: Alric Quan on 06/26/2016 14:27:11 [name] ([id number]) -------------------------------------------------------------------------------- Patient/Caregiver Education Details Patient Name: [name], [name]. Date of Service: 06/26/2016 2:30 PM Medical Record Number: [id number] Patient Account Number: [id number] Date of Birth/Gender: 1935/01/25 (82 y.o. Male) Treating RN: Ahmed Prima Primary Care Physician: [name] Other Clinician: Referring Physician: [name] Treating Physician/Extender: [name] in Treatment: 4 Education Assessment Education Provided To: Patient Education Topics Provided Wound/Skin Impairment: Handouts: Other: change dressing as ordered Methods: Demonstration, Explain/Verbal Responses: State content correctly Electronic Signature(s) Signed: 06/27/2016 4:53:33 PM By: Alric Quan Entered By: Alric Quan on 06/26/2016 14:34:45 [name] ([id number]) -------------------------------------------------------------------------------- Wound Assessment Details Patient Name: [name], [name]. Date of Service: 06/26/2016 2:30 PM Medical Record Number: [id number] Patient Account Number: [id number] Date of Birth/Sex: 11-Aug-1934 (82 y.o. Male) Treating RN: Ahmed Prima Primary Care Makael Stein: [name] Other Clinician: Referring Ascencion Coye: [name] Treating Rayhana Slider/Extender: [name] in Treatment: 4 Wound Status Wound Number: 1 Primary Pressure Ulcer Etiology: Wound Location: Left Malleolus - Lateral Wound Open Wounding Event: Pressure  Injury Status: Date Acquired: 05/01/2016 Comorbid Cataracts, Anemia, Sleep Apnea, Weeks Of Treatment: 4 History: Arrhythmia, Hypertension, Dementia Clustered Wound: No Photos Photo Uploaded By: Alric Quan on 06/26/2016 16:11:54 Wound Measurements Length: (cm) 0.3 Width: (cm) 0.3 Depth: (cm) 0.2 Area: (cm) 0.071 Volume: (cm) 0.014 % Reduction in Area: 63.8% % Reduction in Volume: 30% Epithelialization: None Tunneling: No Undermining: No Wound Description Classification: Category/Stage II Foul Odor Afte Wound Margin: Distinct, outline attached Slough/Fibrino Exudate Amount: Large Exudate Type: Serous Exudate Color: amber r Cleansing: No No Wound Bed Granulation Amount: None Present (0%) Exposed Structure Necrotic Amount: Large (67-100%) Fat Layer (Subcutaneous Tissue) Exposed: Yes Necrotic Quality: Eschar, Adherent [address] [name], [name]. ([id number]) Texture Color No Abnormalities Noted: No No Abnormalities Noted: No Erythema: Yes Moisture Erythema Location: Circumferential No Abnormalities Noted: No Temperature / Pain Temperature: No Abnormality Tenderness on Palpation: Yes Wound Preparation Ulcer Cleansing: Rinsed/Irrigated with Saline Topical Anesthetic Applied: Other: lidocaine 4%, Treatment Notes Wound #1 (Left, Lateral Malleolus) 1. Cleansed with: Clean wound with Normal Saline 2. Anesthetic Topical Lidocaine 4% cream to wound bed prior to debridement 4. Dressing Applied: Santyl Ointment 5. Secondary Dressing Applied Bordered Foam Dressing Dry Gauze Electronic Signature(s) Signed: 06/27/2016 4:53:33 PM By: Alric Quan Entered By: Alric Quan on 06/26/2016 14:31:09 [name] ([id number]) -------------------------------------------------------------------------------- Vitals Details Patient Name: [name], [name]. Date of Service: 06/26/2016 2:30 PM Medical Record Number: [id number] Patient Account  Number: [id number] Date of Birth/Sex: [birth year] (83 y.o. Male) Treating RN: Ahmed Prima Primary Care Raychell Holcomb: [name] Other Clinician: Referring Aleina Burgio: [name] Treating Nurah Petrides/Extender: [name] in Treatment: 4 Vital Signs Time Taken: 14:27 Temperature (F): 97.9 Height (in): 69 Pulse (bpm): 58 Weight (lbs): 174.8 Respiratory Rate (breaths/min): 16 Body Mass Index (BMI): 25.8 Blood Pressure (mmHg): 135/61 Reference Range: 80 - 120 mg / dl Electronic Signature(s) Signed: 06/27/2016 4:53:33 PM By: Alric Quan Entered By: Alric Quan on 06/26/2016 14:28:12

## 2016-06-28 NOTE — Progress Notes (Signed)
KASHDYN, WESTMEYER ([id number]) Visit Report for 06/26/2016 Chief Complaint Document Details Patient Name: [name], [name]. Date of Service: 06/26/2016 2:30 PM Medical Record Number: [id number] Patient Account Number: [id number] Date of Birth/Sex: 1934-07-14 (82 y.o. Male) Treating RN: Ahmed Prima Primary Care Provider: [name] Other Clinician: Referring Provider: [name] Treating Provider/Extender: [name] in Treatment: 4 Information Obtained from: Patient Chief Complaint Patient is at the clinic for treatment of an open pressure ulcer to the left ankle which she's had for about 5 weeks Electronic Signature(s) Signed: 06/26/2016 3:13:55 PM By: Christin Fudge MD, FACS Entered By: Christin Fudge on 06/26/2016 15:13:55 [name] ([id number]) -------------------------------------------------------------------------------- Debridement Details Patient Name: [name], [name]. Date of Service: 06/26/2016 2:30 PM Medical Record Number: [id number] Patient Account Number: [id number] Date of Birth/Sex: [birth year] (82 y.o. Male) Treating RN: Ahmed Prima Primary Care Provider: [name] Other Clinician: Referring Provider: [name] Treating Provider/Extender: [name] in Treatment: 4 Debridement Performed for Wound #1 Left,Lateral Malleolus Assessment: Performed By: Physician Christin Fudge, MD Debridement: Debridement Pre-procedure Yes - 14:38 Verification/Time Out Taken: Start Time: 14:39 Pain Control: Lidocaine 4% Topical Solution Level: Skin/Subcutaneous Tissue Total Area Debrided (L x 0.3 (cm) x 0.3 (cm) = 0.09 (cm) W): Tissue and other Viable, Non-Viable, Exudate, Fibrin/Slough, Subcutaneous material debrided: Instrument: Curette Bleeding: Minimum Hemostasis Achieved: Pressure End Time: 14:41 Procedural Pain: 0 Post Procedural Pain: 0 Response to Treatment: Procedure was tolerated well Post Debridement  Measurements of Total Wound Length: (cm) 0.3 Stage: Category/Stage II Width: (cm) 0.3 Depth: (cm) 0.3 Volume: (cm) 0.021 Character of Wound/Ulcer Post Requires Further Debridement: Debridement Severity of Tissue Post Fat layer exposed Debridement: Post Procedure Diagnosis Same as Pre-procedure Electronic Signature(s) Signed: 06/26/2016 3:13:49 PM By: Christin Fudge MD, FACS Signed: 06/27/2016 4:53:33 PM By: Alric Quan Entered By: Christin Fudge on 06/26/2016 15:13:49 [name], [name] ([id number] Ho, [name] ([id number]) -------------------------------------------------------------------------------- HPI Details Patient Name: [name], [name]. Date of Service: 06/26/2016 2:30 PM Medical Record Number: [id number] Patient Account Number: [id number] Date of Birth/Sex: 1934-08-09 (82 y.o. Male) Treating RN: Ahmed Prima Primary Care Provider: [name] Other Clinician: Referring Provider: [name] Treating Provider/Extender: [name] in Treatment: 4 History of Present Illness Location: left ankle laterally Quality: Patient reports experiencing a dull pain to affected area(s). Severity: Patient states wound are getting worse. Duration: Patient has had the wound for < 5 weeks prior to presenting for treatment Timing: Pain in wound is Intermittent (comes and goes Context: The wound appeared gradually over time Modifying Factors: Other treatment(s) tried include:put on Bactroban and doxycycline Associated Signs and Symptoms: Patient reports having difficulty standing for long periods. HPI Description: 82 year old gentleman who is known to have a history of dementia but is fairly functional and is able to ambulate has been having a pressure ulcer to the left ankle due to laying in bed for longer than he should. Past history of atrial fibrillation, dementia, hyperlipidemia, hypertension. He's also had some orthopedic related surgeries in the past and  was a former smoker and is given up in 1969. Recently seen by his PCP who put him on doxycycline and asked him to apply Bactroban and see [place] at the wound clinic. 06/05/2016 -- had a x-ray of the left ankle which showed no acute bony pathology. Electronic Signature(s) Signed: 06/26/2016 3:14:00 PM By: Christin Fudge MD, FACS Entered By: Christin Fudge on 06/26/2016 15:13:59 [name] ([id number]) -------------------------------------------------------------------------------- Physical Exam Details Patient Name: [name], Jared  M. Date of Service: 06/26/2016 2:30 PM Medical Record Number: [id number] Patient Account Number: [id number] Date of Birth/Sex: 31-Jan-1935 (82 y.o. Male) Treating RN: Ahmed Prima Primary Care Provider: [name] Other Clinician: Referring Provider: [name] Treating Provider/Extender: [name] in Treatment: 4 Constitutional . Pulse regular. Respirations normal and unlabored. Afebrile. . Eyes Nonicteric. Reactive to light. Ears, Nose, Mouth, and Throat Lips, teeth, and gums WNL.Marland Kitchen Moist mucosa without lesions. Neck supple and nontender. No palpable supraclavicular or cervical adenopathy. Normal sized without goiter. Respiratory WNL. No retractions.. Breath sounds WNL, No rubs, rales, rhonchi, or wheeze.. Cardiovascular Heart rhythm and rate regular, no murmur or gallop.. Pedal Pulses WNL. No clubbing, cyanosis or edema. Chest Breasts symmetical and no nipple discharge.. Breast tissue WNL, no masses, lumps, or tenderness.. Lymphatic No adneopathy. No adenopathy. No adenopathy. Musculoskeletal Adexa without tenderness or enlargement.. Digits and nails w/o clubbing, cyanosis, infection, petechiae, ischemia, or inflammatory conditions.. Integumentary (Hair, Skin) No suspicious lesions. No crepitus or fluctuance. No peri-wound warmth or erythema. No masses.Marland Kitchen Psychiatric Judgement and insight Intact.. No evidence of depression,  anxiety, or agitation.. Notes sharp debridement was done with a #3 curet and a lot of the subcutis tissue was removed and it does not probe down to bone. Bleeding controlled with pressure Electronic Signature(s) Signed: 06/26/2016 3:14:25 PM By: Christin Fudge MD, FACS Entered By: Christin Fudge on 06/26/2016 15:14:24 [name] ([id number]) -------------------------------------------------------------------------------- Physician Orders Details Patient Name: [name], [name]. Date of Service: 06/26/2016 2:30 PM Medical Record Number: [id number] Patient Account Number: [id number] Date of Birth/Sex: 1934/10/06 (82 y.o. Male) Treating RN: Ahmed Prima Primary Care Provider: [name] Other Clinician: Referring Provider: [name] Treating Provider/Extender: [name] in Treatment: 4 Verbal / Phone Orders: Yes Clinician: Pinkerton, Debi Read Back and Verified: Yes Diagnosis Coding Wound Cleansing Wound #1 Left,Lateral Malleolus o Clean wound with Normal Saline. o May Shower, gently pat wound dry prior to applying new dressing. Anesthetic Wound #1 Left,Lateral Malleolus o Topical Lidocaine 4% cream applied to wound bed prior to debridement - for clinic use Skin Barriers/Peri-Wound Care Wound #1 Left,Lateral Malleolus o Skin Prep Primary Wound Dressing Wound #1 Left,Lateral Malleolus o Santyl Ointment - IN OFFICE o Medihoney gel Secondary Dressing Wound #1 Left,Lateral Malleolus o Dry Gauze o Boardered Foam Dressing Follow-up Appointments Wound #1 Left,Lateral Malleolus o Return Appointment in 1 week. Edema Control Wound #1 Left,Lateral Malleolus o Elevate legs to the level of the heart and pump ankles as often as possible Off-Loading Wound #1 Left,Lateral Malleolus o Turn and reposition every 2 hours o Other: - keep pressure off of affected area [name], [name]. ([id number]) Additional Orders / Instructions Wound #1  Left,Lateral Malleolus o Increase protein intake. Medications-please add to medication list. Wound #1 Left,Lateral Malleolus o Other: - Vitamin A, Vitamin C, Zinc, MVI Electronic Signature(s) Signed: 06/26/2016 4:29:50 PM By: Christin Fudge MD, FACS Entered By: Christin Fudge on 06/26/2016 15:14:36 [name] ([id number]) -------------------------------------------------------------------------------- Problem List Details Patient Name: [name], [name]. Date of Service: 06/26/2016 2:30 PM Medical Record Number: [id number] Patient Account Number: [id number] Date of Birth/Sex: [birth year] (82 y.o. Male) Treating RN: Ahmed Prima Primary Care Provider: [name] Other Clinician: Referring Provider: [name] Treating Provider/Extender: [name] in Treatment: 4 Active Problems ICD-10 Encounter Code Description Active Date Diagnosis [phone] Pressure ulcer of left ankle, stage 2 05/29/2016 Yes F02.80 Dementia in other diseases classified elsewhere without 05/29/2016 Yes behavioral disturbance Inactive Problems Resolved Problems Electronic  Signature(s) Signed: 06/26/2016 3:13:37 PM By: Christin Fudge MD, FACS Entered By: Christin Fudge on 06/26/2016 15:13:36 [name] ([id number]) -------------------------------------------------------------------------------- Progress Note Details Patient Name: [name], [name]. Date of Service: 06/26/2016 2:30 PM Medical Record Number: [id number] Patient Account Number: [id number] Date of Birth/Sex: 1935-01-15 (82 y.o. Male) Treating RN: Ahmed Prima Primary Care Provider: [name] Other Clinician: Referring Provider: [name] Treating Provider/Extender: [name] in Treatment: 4 Subjective Chief Complaint Information obtained from Patient Patient is at the clinic for treatment of an open pressure ulcer to the left ankle which she's had for about 5 weeks History of Present Illness  (HPI) The following HPI elements were documented for the patient's wound: Location: left ankle laterally Quality: Patient reports experiencing a dull pain to affected area(s). Severity: Patient states wound are getting worse. Duration: Patient has had the wound for < 5 weeks prior to presenting for treatment Timing: Pain in wound is Intermittent (comes and goes Context: The wound appeared gradually over time Modifying Factors: Other treatment(s) tried include:put on Bactroban and doxycycline Associated Signs and Symptoms: Patient reports having difficulty standing for long periods. 82 year old gentleman who is known to have a history of dementia but is fairly functional and is able to ambulate has been having a pressure ulcer to the left ankle due to laying in bed for longer than he should. Past history of atrial fibrillation, dementia, hyperlipidemia, hypertension. He's also had some orthopedic related surgeries in the past and was a former smoker and is given up in 1969. Recently seen by his PCP who put him on doxycycline and asked him to apply Bactroban and see [place] at the wound clinic. 06/05/2016 -- had a x-ray of the left ankle which showed no acute bony pathology. Objective Constitutional Pulse regular. Respirations normal and unlabored. Afebrile. Vitals Time Taken: 2:27 PM, Height: 69 in, Weight: 174.8 lbs, BMI: 25.8, Temperature: 97.9 F, Pulse: 58 bpm, Respiratory Rate: 16 breaths/min, Blood Pressure: 135/61 mmHg. [name], [name] ([id number]) Eyes Nonicteric. Reactive to light. Ears, Nose, Mouth, and Throat Lips, teeth, and gums WNL.Marland Kitchen Moist mucosa without lesions. Neck supple and nontender. No palpable supraclavicular or cervical adenopathy. Normal sized without goiter. Respiratory WNL. No retractions.. Breath sounds WNL, No rubs, rales, rhonchi, or wheeze.. Cardiovascular Heart rhythm and rate regular, no murmur or gallop.. Pedal Pulses WNL. No clubbing, cyanosis or  edema. Chest Breasts symmetical and no nipple discharge.. Breast tissue WNL, no masses, lumps, or tenderness.. Lymphatic No adneopathy. No adenopathy. No adenopathy. Musculoskeletal Adexa without tenderness or enlargement.. Digits and nails w/o clubbing, cyanosis, infection, petechiae, ischemia, or inflammatory conditions.Marland Kitchen Psychiatric Judgement and insight Intact.. No evidence of depression, anxiety, or agitation.. General Notes: sharp debridement was done with a #3 curet and a lot of the subcutis tissue was removed and it does not probe down to bone. Bleeding controlled with pressure Integumentary (Hair, Skin) No suspicious lesions. No crepitus or fluctuance. No peri-wound warmth or erythema. No masses.. Wound #1 status is Open. Original cause of wound was Pressure Injury. The wound is located on the Left,Lateral Malleolus. The wound measures 0.3cm length x 0.3cm width x 0.2cm depth; 0.071cm^2 area and 0.014cm^3 volume. There is Fat Layer (Subcutaneous Tissue) Exposed exposed. There is no tunneling or undermining noted. There is a large amount of serous drainage noted. The wound margin is distinct with the outline attached to the wound base. There is no granulation within the wound bed. There is a large (67- 100%) amount of necrotic tissue within  the wound bed including Eschar and Adherent Slough. The periwound skin appearance exhibited: Erythema. The surrounding wound skin color is noted with erythema which is circumferential. Periwound temperature was noted as No Abnormality. The periwound has tenderness on palpation. Assessment [name], [name] ([id number]) Active Problems ICD-10 [id number] - Pressure ulcer of left ankle, stage 2 F02.80 - Dementia in other diseases classified elsewhere without behavioral disturbance Procedures Wound #1 Wound #1 is a Pressure Ulcer located on the Left,Lateral Malleolus . There was a Skin/Subcutaneous Tissue Debridement [id number]) debridement with  total area of 0.09 sq cm performed by Christin Fudge, MD. with the following instrument(s): Curette to remove Viable and Non-Viable tissue/material including Exudate, Fibrin/Slough, and Subcutaneous after achieving pain control using Lidocaine 4% Topical Solution. A time out was conducted at 14:38, prior to the start of the procedure. A Minimum amount of bleeding was controlled with Pressure. The procedure was tolerated well with a pain level of 0 throughout and a pain level of 0 following the procedure. Post Debridement Measurements: 0.3cm length x 0.3cm width x 0.3cm depth; 0.021cm^3 volume. Post debridement Stage noted as Category/Stage II. Character of Wound/Ulcer Post Debridement requires further debridement. Severity of Tissue Post Debridement is: Fat layer exposed. Post procedure Diagnosis Wound #1: Same as Pre-Procedure Plan Wound Cleansing: Wound #1 Left,Lateral Malleolus: Clean wound with Normal Saline. May Shower, gently pat wound dry prior to applying new dressing. Anesthetic: Wound #1 Left,Lateral Malleolus: Topical Lidocaine 4% cream applied to wound bed prior to debridement - for clinic use Skin Barriers/Peri-Wound Care: Wound #1 Left,Lateral Malleolus: Skin Prep Primary Wound Dressing: Wound #1 Left,Lateral Malleolus: Santyl Ointment - IN OFFICE Medihoney gel Secondary Dressing: Wound #1 Left,Lateral Malleolus: Dry Gauze RAIJON, MARTIAN ([id number]) Boardered Foam Dressing Follow-up Appointments: Wound #1 Left,Lateral Malleolus: Return Appointment in 1 week. Edema Control: Wound #1 Left,Lateral Malleolus: Elevate legs to the level of the heart and pump ankles as often as possible Off-Loading: Wound #1 Left,Lateral Malleolus: Turn and reposition every 2 hours Other: - keep pressure off of affected area Additional Orders / Instructions: Wound #1 Left,Lateral Malleolus: Increase protein intake. Medications-please add to medication list.: Wound #1 Left,Lateral  Malleolus: Other: - Vitamin A, Vitamin C, Zinc, MVI I have recommended: 1. Santyl ointment to be applied daily, and a protective bordered foam. this was not affordable so they're using Medihoney. 2. Off-Loading has been discussed in great detail 3. adequate protein, vitamin A, vitamin C and zinc 4. Regular visits to the wound center Electronic Signature(s) Signed: 06/26/2016 3:15:33 PM By: Christin Fudge MD, FACS Entered By: Christin Fudge on 06/26/2016 15:15:33 [name] ([id number]) -------------------------------------------------------------------------------- SuperBill Details Patient Name: BAINE, BRESTER. Date of Service: 06/26/2016 Medical Record Number: [id number] Patient Account Number: [id number] Date of Birth/Sex: [birth year] (82 y.o. Male) Treating RN: Ahmed Prima Primary Care Provider: [name] Other Clinician: Referring Provider: [name] Treating Provider/Extender: Christin Fudge Service Line: Outpatient Weeks in Treatment: 4 Diagnosis Coding ICD-10 Codes Code Description [phone] Pressure ulcer of left ankle, stage 2 F02.80 Dementia in other diseases classified elsewhere without behavioral disturbance Facility Procedures CPT4: Description Modifier Quantity Code [id number] - DEB SUBQ TISSUE 20 SQ CM/< 1 ICD-10 Description Diagnosis L89.522 Pressure ulcer of left ankle, stage 2 F02.80 Dementia in other diseases classified elsewhere without behavioral disturbance Physician Procedures CPT4: Description Modifier Quantity Code [id number] - WC PHYS SUBQ TISS 20 SQ CM 1 ICD-10 Description Diagnosis L89.522 Pressure ulcer of left ankle, stage 2 F02.80 Dementia in other diseases classified elsewhere  without behavioral disturbance Electronic Signature(s) Signed: 06/26/2016 3:15:50 PM By: Christin Fudge MD, FACS Entered By: Christin Fudge on 06/26/2016 15:15:50

## 2016-07-01 ENCOUNTER — Ambulatory Visit: Payer: PPO | Admitting: Physical Therapy

## 2016-07-01 DIAGNOSIS — R2689 Other abnormalities of gait and mobility: Secondary | ICD-10-CM

## 2016-07-01 DIAGNOSIS — R296 Repeated falls: Secondary | ICD-10-CM

## 2016-07-01 DIAGNOSIS — M6281 Muscle weakness (generalized): Secondary | ICD-10-CM | POA: Diagnosis not present

## 2016-07-01 DIAGNOSIS — R293 Abnormal posture: Secondary | ICD-10-CM

## 2016-07-01 NOTE — Therapy (Signed)
[facility] [address] [place], [place], [address] Phone: [phone]   Fax:  [phone]  Physical Therapy Treatment  Patient Details  Name: [name] MRN: [id number] Date of Birth: 13-Mar-1935 Referring Provider: Jennings Books, MD  Encounter Date: 07/01/2016      PT End of Session - 07/01/16 [id number]    Visit Number 6   Number of Visits 8   Date for PT Re-Evaluation 07/10/16   Authorization - Visit Number 6   Authorization - Number of Visits 10   PT Start Time [id number]   PT Stop Time 1436   PT Time Calculation (min) 49 min   Equipment Utilized During Treatment Gait belt   Activity Tolerance Patient tolerated treatment well   Behavior During Therapy [facility] for tasks assessed/performed      Past Medical History:  Diagnosis Date  . Absolute anemia 09/11/2014  . Anxiety   . Atrial fibrillation ([place]) 01/23/2014  . BP (high blood pressure) 11/07/2013  . Carotid artery disease ([place]) 09/11/2014  . Complete rotator cuff rupture of left shoulder 12/30/2013  . Dementia 09/11/2014  . Depression   . Heart murmur   . HLD (hyperlipidemia) 01/23/2014  . Hypertension   . Obstructive apnea 11/07/2013  . Personal history of other diseases of the circulatory system 01/23/2014  . Sleep apnea   . 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 Back Surgery- Tumor Removal    . SHOULDER SURGERY Right 2007    There were no vitals filed for this visit.      Subjective Assessment - 07/01/16 1436    Subjective Patient continues to struggle with short term memory and relies on wife for HEP. Patient continues to be pleasant and eager to participate in session.    Patient is accompained by: Family member   Pertinent History Hx of left knee pain, low back pain, shoulder pain. Hx of falls, dementia, and teretoma of the spine   Limitations Sitting;Lifting;Standing;Walking;House hold activities   Diagnostic tests BERG   Patient Stated Goals get stronger, pick up legs better when walking, getting up easier and faster   Currently in Pain? No/denies     Neuro Re-ed: LSVT big inspired seated exercises: Hands on knees bring out into abduction with palms forward, raise arms and then return to hands on knees. 2x10. Shoulder flexion 1x10 each side Utilized to open patient's posture and increase trunk and UE ROM.  -Reaching arm across body turning head to look at arm and twisting trunk. Return to hands on knees. 2x10 both sides. Increase patient trunk ROM for improved body mechanics.  Throwing ball with wife with CGA/SBA 2x10. Good control , no LOB Standing on Airex Pad 2x60 seconds feet normal, occasional UE assist, Marching on airex pad 2x30 seconds frequent UE assitance.   Therex: Thoracic rotations with UE reach in seated 10x each side Sit to stand 5x 2 1/2 lb ankle weights: standing marches 1x10, hip extension 1x10, hip abduction 1x10 cues for body mechanics big steps during walking (LSVT BIG style) 4x ~50 ft. . Frequent cues for continuation.  Ambulating 3 minutes no rest breaks with cues for upright posture   Pt response to medical necessity: Patient will benefit from skilled physical therapy to improve gait mechanics, balance, decrease falls, improve gross strength and posture.           PT Long Term Goals - 06/26/16  [place] #1   Title Patient will increase score on Berg Balance to 49/56 for decreased fall risk.    Baseline 2/8: 44/56   Time 4   Period Weeks   Status New     PT LONG TERM GOAL #2   Title Patient will have gross 4/5 bilateral LE strength to increase ability to perform functional activities.    Baseline Patient has a gross LLE 3+/5, RLE 4-/5   Time 4   Period Weeks   Status New     PT LONG TERM GOAL #3   Title Patient will ambulate 5 minutes with a cane without LOB to increase community mobility.     Baseline Patient fatigues  quickly.    Time 4   Period Weeks   Status New     PT LONG TERM GOAL #4   Title Patient will perform 10 sit to stands without use of hands or LOB to demonstrate improved capacity for functional activities.    Baseline Patient can perform 1 sit to stand without hands.    Time 4   Period Weeks   Status New     PT LONG TERM GOAL #5   Title Patient will have upright posture for 10 minutes to allow for better body mechanics and decreased back pain.    Baseline Patient requires max cueing for upright posture   Time 4   Period Weeks   Status New     Additional Long Term Goals   Additional Long Term Goals Yes     PT LONG TERM GOAL #6   Title Patient will not have SI/back pain while ambulating to allow for increased community mobility.    Baseline Patient has occasional 5/10 back pain while ambulating   Time 4   Period Weeks   Status New            Plan - 07/01/16 1438    Clinical Impression Statement Patient presents to therapy session with continued forward trunk posture with ambulation that is corrected with cueing. Trunk rotation and stretching exercises promoted improved postural control for a short duration afterwards. Short term memory loss occasionally complicates sessions requiring the patient to have frequent cueing for task orientation. Balance is continuing to improve with patient having less episodes of LOB throughout session and on Airex pad. Pt. was able to perform dynamic marching on airex pad with single UE assist. Pt. compensates for weak LE musculature with trunk leaning as seen in weighted standing therex. Pt will continue to benefit from skilled physical therapy to decrease falls risk, improve gross strength and ROM, control pain, and allow patient safe ambulation in the community for improved quality of life   Rehab Potential Fair   Clinical Impairments Affecting Rehab Potential hx of falls, dementia, teratoma, mitral valve polapse, cancer, a fib, L knee injury, L  shoulder, back pain   PT Frequency 2x / week   PT Duration 4 weeks   PT Treatment/Interventions ADLs/Self Care Home Management;Aquatic Therapy;Cryotherapy;Electrical Stimulation;Biofeedback;Moist Heat;DME Instruction;Gait training;Stair training;Functional mobility training;Therapeutic activities;Therapeutic exercise;Balance training;Neuromuscular re-education;Patient/family education;Orthotic Fit/Training;Manual techniques;Passive range of motion;Dry needling;Energy conservation   PT Next Visit Plan floor recovery    PT Home Exercise Plan see sheet   Consulted and Agree with Plan of Care Patient;Family member/caregiver   Family Member Consulted wife      Patient will benefit from skilled therapeutic intervention in order to improve the following deficits and impairments:  Abnormal gait, Decreased  activity tolerance, Decreased balance, Decreased mobility, Decreased knowledge of use of DME, Decreased endurance, Decreased range of motion, Decreased safety awareness, Decreased skin integrity, Decreased knowledge of precautions, Decreased strength, Hypomobility, Difficulty walking, Impaired flexibility, Postural dysfunction, Improper body mechanics, Decreased cognition  Visit Diagnosis: Muscle weakness (generalized)  Other abnormalities of gait and mobility  Repeated falls  Abnormal posture     Problem List Patient Active Problem List   Diagnosis Date Noted  . Fall 02/18/2016  . Scalp laceration 02/18/2016  . Splenic laceration 02/18/2016  . Acute blood loss anemia 02/18/2016  . Multiple rib fractures 02/16/2016  . Mild dementia 04/10/2015  . Aggrieved 03/02/2015  . Absolute anemia 09/11/2014  . Carotid artery disease ([place]) 09/11/2014  . Dementia 09/11/2014  . History of repair of rotator cuff 03/02/2014  . Atrial fibrillation with RVR ([place]) 01/23/2014  . Personal history of other diseases of the circulatory system 01/23/2014  . HLD (hyperlipidemia) 01/23/2014  . Complete  rotator cuff rupture of left shoulder 12/30/2013  . BP (high blood pressure) 11/07/2013  . Amnesia 11/07/2013  . Obstructive apnea 11/07/2013   Pura Spice, PT, DPT # [id number] Janna Arch, SPT 07/01/2016, 2:50 PM  [facility] [address] [place], [place], [address] Phone: [phone]   Fax:  [phone]  Name: [name] MRN: [id number] Date of Birth: 1935/03/29

## 2016-07-03 ENCOUNTER — Encounter: Payer: PPO | Attending: Surgery | Admitting: Surgery

## 2016-07-03 ENCOUNTER — Ambulatory Visit: Payer: PPO | Attending: Neurology | Admitting: Physical Therapy

## 2016-07-03 DIAGNOSIS — I10 Essential (primary) hypertension: Secondary | ICD-10-CM | POA: Diagnosis not present

## 2016-07-03 DIAGNOSIS — F0280 Dementia in other diseases classified elsewhere without behavioral disturbance: Secondary | ICD-10-CM | POA: Diagnosis not present

## 2016-07-03 DIAGNOSIS — R293 Abnormal posture: Secondary | ICD-10-CM | POA: Diagnosis not present

## 2016-07-03 DIAGNOSIS — E785 Hyperlipidemia, unspecified: Secondary | ICD-10-CM | POA: Insufficient documentation

## 2016-07-03 DIAGNOSIS — L89522 Pressure ulcer of left ankle, stage 2: Secondary | ICD-10-CM | POA: Diagnosis not present

## 2016-07-03 DIAGNOSIS — R296 Repeated falls: Secondary | ICD-10-CM | POA: Insufficient documentation

## 2016-07-03 DIAGNOSIS — I4891 Unspecified atrial fibrillation: Secondary | ICD-10-CM | POA: Insufficient documentation

## 2016-07-03 DIAGNOSIS — M6281 Muscle weakness (generalized): Secondary | ICD-10-CM | POA: Insufficient documentation

## 2016-07-03 DIAGNOSIS — D649 Anemia, unspecified: Secondary | ICD-10-CM | POA: Diagnosis not present

## 2016-07-03 DIAGNOSIS — R2689 Other abnormalities of gait and mobility: Secondary | ICD-10-CM | POA: Insufficient documentation

## 2016-07-03 DIAGNOSIS — G4730 Sleep apnea, unspecified: Secondary | ICD-10-CM | POA: Insufficient documentation

## 2016-07-03 DIAGNOSIS — Z87891 Personal history of nicotine dependence: Secondary | ICD-10-CM | POA: Insufficient documentation

## 2016-07-03 NOTE — Therapy (Signed)
[facility] [address] [place], [place], [address] Phone: [phone]   Fax:  [phone]  Physical Therapy Treatment  Patient Details  Name: [name] MRN: [id number] Date of Birth: December 27, 1934 Referring Provider: Jennings Books, MD  Encounter Date: 07/03/2016      PT End of Session - 07/03/16 1351    Visit Number 7   Number of Visits 8   Date for PT Re-Evaluation 07/10/16   Authorization - Visit Number 7   Authorization - Number of Visits 10   PT Start Time [id number]   PT Stop Time [id number]   PT Time Calculation (min) 63 min   Equipment Utilized During Treatment Gait belt   Activity Tolerance Patient tolerated treatment well   Behavior During Therapy [facility] for tasks assessed/performed      Past Medical History:  Diagnosis Date  . Absolute anemia 09/11/2014  . Anxiety   . Atrial fibrillation ([place]) 01/23/2014  . BP (high blood pressure) 11/07/2013  . Carotid artery disease ([place]) 09/11/2014  . Complete rotator cuff rupture of left shoulder 12/30/2013  . Dementia 09/11/2014  . Depression   . Heart murmur   . HLD (hyperlipidemia) 01/23/2014  . Hypertension   . Obstructive apnea 11/07/2013  . Personal history of other diseases of the circulatory system 01/23/2014  . Sleep apnea   . Teratoma, malignant ([place])    lower back tumor    Past Surgical History:  Procedure Laterality Date  . Back Surgery- Tumor Removal    . SHOULDER SURGERY Right 2007    There were no vitals filed for this visit.      Subjective Assessment - 07/03/16 1250    Subjective Patient is having some aches due to the incliment weather in his knuckles and back.    Patient is accompained by: Family member   Pertinent History Hx of left knee pain, low back pain, shoulder pain. Hx of falls, dementia, and teretoma of the spine   Limitations Sitting;Lifting;Standing;Walking;House hold activities   Diagnostic tests BERG   Patient Stated Goals get stronger, pick up legs  better when walking, getting up easier and faster   Currently in Pain? No/denies     There Act: Fall recovery. Supine and prone on floor to standing. Supine: Transition to sidelie, long sit, quadruped, half kneeling with support surface and standing pushing off of knee and support surface. Prone: lift leg into quadruped, half kneeling with support surface, and then standing by pushing off of knee and support surface. Initially required Mod Assistance with max verbal cues and progressed to CGA with min cueing.  TherEx: 2lb dumbbell bicep curls 1x10, arm swings 2lb 30 seconds, lateral shoulder abduction 0lb 10x. Focus on upright posture and maintaining BOS. Back against wall 1x10 bicep curls 2lb, punches 1x10 2lb. Wall provided tactile cueing for posture.  Ambulating 80 ft big arms big legs Obstacle course: Step over 2inch wide steps and up and over 3inch folded mat to promote increased step length and negotiation of obstacles in the community. X4. Frequent mistepping/lack of judging distances. Improved with repetition. Marching at // bars with cues for upright posture and height of knee amplitude.    Pt. will benefit from continued skilled physical therapy to decrease fall risk, improve gross strength and ROM, control pain, and allow patient safe ambulation in the community for improved quality of life.         PT Education - 07/03/16 1351    Education provided Yes  Education Details fall recovery   Person(s) Educated Patient;Spouse   Methods Explanation;Demonstration   Comprehension Verbalized understanding;Returned demonstration             PT Long Term Goals - 06/26/16 1415      PT LONG TERM GOAL #1   Title Patient will increase score on Berg Balance to 49/56 for decreased fall risk.    Baseline 2/8: 44/56   Time 4   Period Weeks   Status New     PT LONG TERM GOAL #2   Title Patient will have gross 4/5 bilateral LE strength to increase ability to perform functional  activities.    Baseline Patient has a gross LLE 3+/5, RLE 4-/5   Time 4   Period Weeks   Status New     PT LONG TERM GOAL #3   Title Patient will ambulate 5 minutes with a cane without LOB to increase community mobility.     Baseline Patient fatigues quickly.    Time 4   Period Weeks   Status New     PT LONG TERM GOAL #4   Title Patient will perform 10 sit to stands without use of hands or LOB to demonstrate improved capacity for functional activities.    Baseline Patient can perform 1 sit to stand without hands.    Time 4   Period Weeks   Status New     PT LONG TERM GOAL #5   Title Patient will have upright posture for 10 minutes to allow for better body mechanics and decreased back pain.    Baseline Patient requires max cueing for upright posture   Time 4   Period Weeks   Status New     Additional Long Term Goals   Additional Long Term Goals Yes     PT LONG TERM GOAL #6   Title Patient will not have SI/back pain while ambulating to allow for increased community mobility.    Baseline Patient has occasional 5/10 back pain while ambulating   Time 4   Period Weeks   Status New               Plan - 07/03/16 1355    Clinical Impression Statement Pt. presented to physical therapy session with continued postural deficits. Upon request from pt. and pt.'s wife floor recovery/fall recovery was a focus of education this session. Pt. practiced fall recovery from supine and prone on mat with frequent verbal and tactile cueing and CGA/MinA.  Overall weakness was a limiting factor and required frequent rest breaks. Pt. was able to perform with min verbal cueing after repetition. Utilizing wall as tactile cueing for upright posture was beneficial and demonstrated improved carryover to ambulation afterwards. Pt. will benefit from continued skilled physical therapy to decrease fall risk, improve gross strength and ROM, control pain, and allow patient safe ambulation in the community for  improved quality of life   Rehab Potential Fair   Clinical Impairments Affecting Rehab Potential hx of falls, dementia, teratoma, mitral valve polapse, cancer, a fib, L knee injury, L shoulder, back pain   PT Frequency 2x / week   PT Duration 4 weeks   PT Treatment/Interventions ADLs/Self Care Home Management;Aquatic Therapy;Cryotherapy;Electrical Stimulation;Biofeedback;Moist Heat;DME Instruction;Gait training;Stair training;Functional mobility training;Therapeutic activities;Therapeutic exercise;Balance training;Neuromuscular re-education;Patient/family education;Orthotic Fit/Training;Manual techniques;Passive range of motion;Dry needling;Energy conservation   PT Next Visit Plan CANE and WALKER   PT Home Exercise Plan see sheet   Consulted and Agree with Plan of Care Patient;Family member/caregiver  Family Member Consulted wife      Patient will benefit from skilled therapeutic intervention in order to improve the following deficits and impairments:  Abnormal gait, Decreased activity tolerance, Decreased balance, Decreased mobility, Decreased knowledge of use of DME, Decreased endurance, Decreased range of motion, Decreased safety awareness, Decreased skin integrity, Decreased knowledge of precautions, Decreased strength, Hypomobility, Difficulty walking, Impaired flexibility, Postural dysfunction, Improper body mechanics, Decreased cognition  Visit Diagnosis: Muscle weakness (generalized)  Other abnormalities of gait and mobility  Repeated falls  Abnormal posture     Problem List Patient Active Problem List   Diagnosis Date Noted  . Fall 02/18/2016  . Scalp laceration 02/18/2016  . Splenic laceration 02/18/2016  . Acute blood loss anemia 02/18/2016  . Multiple rib fractures 02/16/2016  . Mild dementia 04/10/2015  . Aggrieved 03/02/2015  . Absolute anemia 09/11/2014  . Carotid artery disease ([place]) 09/11/2014  . Dementia 09/11/2014  . History of repair of rotator cuff  03/02/2014  . Atrial fibrillation with RVR ([place]) 01/23/2014  . Personal history of other diseases of the circulatory system 01/23/2014  . HLD (hyperlipidemia) 01/23/2014  . Complete rotator cuff rupture of left shoulder 12/30/2013  . BP (high blood pressure) 11/07/2013  . Amnesia 11/07/2013  . Obstructive apnea 11/07/2013   Pura Spice, PT, DPT # [id number] Janna Arch, SPT 07/04/2016, 3:10 PM  [place] [address] [place], [place], [address] Phone: [phone]   Fax:  [phone]  Name: JILBERTO LABINE MRN: [id number] Date of Birth: March 23, 1935

## 2016-07-04 NOTE — Progress Notes (Signed)
BENTLEE, PACKETT ([id number]) Visit Report for 07/03/2016 Arrival Information Details Patient Name: [name], [name]. Date of Service: 07/03/2016 2:30 PM Medical Record Number: [id number] Patient Account Number: [id number] Date of Birth/Sex: [birth year] (82 y.o. Male) Treating RN: Montey Hora Primary Care Kaycee Haycraft: [name] Other Clinician: Referring Nayely Dingus: [name] Treating Noland Pizano/Extender: [name] in Treatment: 5 Visit Information History Since Last Visit Added or deleted any medications: No Patient Arrived: Ambulatory Any new allergies or adverse reactions: No Arrival Time: 14:34 Had a fall or experienced change in No Accompanied By: spouse activities of daily living that may affect Transfer Assistance: None risk of falls: Patient Identification Verified: Yes Signs or symptoms of abuse/neglect since last No Secondary Verification Process Yes visito Completed: Hospitalized since last visit: No Patient Requires Transmission-Based No Has Dressing in Place as Prescribed: Yes Precautions: Pain Present Now: No Patient Has Alerts: Yes Patient Alerts: ASA Electronic Signature(s) Signed: 07/03/2016 4:53:39 PM By: Montey Hora Entered By: Montey Hora on 07/03/2016 14:35:02 [name] ([id number]) -------------------------------------------------------------------------------- Encounter Discharge Information Details Patient Name: MURL, [name]. Date of Service: 07/03/2016 2:30 PM Medical Record Number: [id number] Patient Account Number: [id number] Date of Birth/Sex: 1934/08/31 (82 y.o. Male) Treating RN: Montey Hora Primary Care Treasure Ochs: [name] Other Clinician: Referring Maelie Chriswell: [name] Treating Yavonne Kiss/Extender: [name] in Treatment: 5 Encounter Discharge Information Items Discharge Pain Level: 0 Discharge Condition: Stable Ambulatory Status: Ambulatory Discharge Destination: Home Transportation:  Private Auto Accompanied By: spouse Schedule Follow-up Appointment: Yes Medication Reconciliation completed and provided to Patient/Care No Joselinne Lawal: Provided on Clinical Summary of Care: 07/03/2016 Form Type Recipient Paper Patient ET Electronic Signature(s) Signed: 07/03/2016 3:05:05 PM By: Montey Hora Previous Signature: 07/03/2016 3:03:53 PM Version By: Ruthine Dose Entered By: Montey Hora on 07/03/2016 15:05:05 [name] ([id number]) -------------------------------------------------------------------------------- Lower Extremity Assessment Details Patient Name: [name], [name]. Date of Service: 07/03/2016 2:30 PM Medical Record Number: [id number] Patient Account Number: [id number] Date of Birth/Sex: [birth year] (82 y.o. Male) Treating RN: Montey Hora Primary Care Taylen Osorto: [name] Other Clinician: Referring Shoaib Siefker: [name] Treating Maybelline Kolarik/Extender: [name] in Treatment: 5 Vascular Assessment Pulses: Dorsalis Pedis Palpable: [Left:Yes] Posterior Tibial Extremity colors, hair growth, and conditions: Extremity Color: [Left:Normal] Hair Growth on Extremity: [Left:No] Temperature of Extremity: [Left:Warm] Capillary Refill: [Left:< 3 seconds] Electronic Signature(s) Signed: 07/03/2016 4:53:39 PM By: Montey Hora Entered By: Montey Hora on 07/03/2016 14:51:56 [name] ([id number]) -------------------------------------------------------------------------------- Multi Wound Chart Details Patient Name: [name]. Date of Service: 07/03/2016 2:30 PM Medical Record Number: [id number] Patient Account Number: [id number] Date of Birth/Sex: Jun 05, 1934 (82 y.o. Male) Treating RN: Montey Hora Primary Care Paola Aleshire: [name] Other Clinician: Referring Kaitlin Ardito: [name] Treating Sajjad Honea/Extender: [name] in Treatment: 5 Vital Signs Height(in): 69 Pulse(bpm): 54 Weight(lbs): 174.8 Blood  Pressure 134/51 (mmHg): Body Mass Index(BMI): 26 Temperature(F): 98.1 Respiratory Rate 18 (breaths/min): Photos: [1:No Photos] [N/A:N/A] Wound Location: [1:Left Malleolus - Lateral] [N/A:N/A] Wounding Event: [1:Pressure Injury] [N/A:N/A] Primary Etiology: [1:Pressure Ulcer] [N/A:N/A] Comorbid History: [1:Cataracts, Anemia, Sleep Apnea, Arrhythmia, Hypertension, Dementia] [N/A:N/A] Date Acquired: [1:05/01/2016] [N/A:N/A] Weeks of Treatment: [1:5] [N/A:N/A] Wound Status: [1:Open] [N/A:N/A] Measurements L x W x D 0.3x0.2x0.2 [N/A:N/A] (cm) Area (cm) : [1:0.047] [N/A:N/A] Volume (cm) : [1:0.009] [N/A:N/A] % Reduction in Area: [1:76.00%] [N/A:N/A] % Reduction in Volume: 55.00% [N/A:N/A] Classification: [1:Category/Stage II] [N/A:N/A] Exudate Amount: [1:Large] [N/A:N/A] Exudate Type: [1:Serous] [N/A:N/A] Exudate Color: [1:amber] [N/A:N/A] Wound Margin: [1:Distinct, outline attached] [N/A:N/A] Granulation Amount: [1:Small (1-33%)] [  N/A:N/A] Granulation Quality: [1:Pink] [N/A:N/A] Necrotic Amount: [1:Large (67-100%)] [N/A:N/A] Necrotic Tissue: [1:Eschar, Adherent Slough] [N/A:N/A] Exposed Structures: [1:Fat Layer (Subcutaneous Tissue) Exposed: Yes] [N/A:N/A] Epithelialization: [1:None] [N/A:N/A] Debridement: [N/A:N/A] Debridement [id number]) Pre-procedure 14:51 N/A N/A Verification/Time Out Taken: Pain Control: Lidocaine 4% Topical N/A N/A Solution Tissue Debrided: Fibrin/Slough, N/A N/A Subcutaneous Level: Skin/Subcutaneous N/A N/A Tissue Debridement Area (sq 0.06 N/A N/A cm): Instrument: Forceps N/A N/A Bleeding: Minimum N/A N/A Hemostasis Achieved: Pressure N/A N/A Procedural Pain: 0 N/A N/A Post Procedural Pain: 0 N/A N/A Debridement Treatment Procedure was tolerated N/A N/A Response: well Post Debridement 0.3x0.2x0.2 N/A N/A Measurements L x W x D (cm) Post Debridement 0.009 N/A N/A Volume: (cm) Post Debridement Category/Stage II N/A  N/A Stage: Periwound Skin Texture: No Abnormalities Noted N/A N/A Periwound Skin No Abnormalities Noted N/A N/A Moisture: Periwound Skin Color: Erythema: Yes N/A N/A Erythema Location: Circumferential N/A N/A Temperature: No Abnormality N/A N/A Tenderness on Yes N/A N/A Palpation: Wound Preparation: Ulcer Cleansing: N/A N/A Rinsed/Irrigated with Saline Topical Anesthetic Applied: Other: lidocaine 4% Procedures Performed: Debridement N/A N/A Treatment Notes Wound #1 (Left, Lateral Malleolus) 1. Cleansed with: Clean wound with Normal Saline 2. Anesthetic UWAIS, BOMMER. ([id number]) Topical Lidocaine 4% cream to wound bed prior to debridement 4. Dressing Applied: Santyl Ointment 5. Secondary Dressing Applied Bordered Foam Dressing Dry Gauze Electronic Signature(s) Signed: 07/03/2016 2:56:39 PM By: Christin Fudge MD, FACS Entered By: Christin Fudge on 07/03/2016 14:56:39 [name] ([id number]) -------------------------------------------------------------------------------- Real Details Patient Name: RASAUN, BRUTTO. Date of Service: 07/03/2016 2:30 PM Medical Record Number: [id number] Patient Account Number: [id number] Date of Birth/Sex: 1935-03-02 (82 y.o. Male) Treating RN: Montey Hora Primary Care Skya Mccullum: [name] Other Clinician: Referring Sabrin Dunlevy: [name] Treating Toussaint Golson/Extender: [name] in Treatment: 5 Active Inactive ` Abuse / Safety / Falls / Self Care Management Nursing Diagnoses: Potential for falls Goals: Patient will remain injury free Date Initiated: 05/29/2016 Target Resolution Date: 08/02/2016 Goal Status: Active Interventions: Assess fall risk on admission and as needed Assess impairment of mobility on admission and as needed per policy Notes: ` Nutrition Nursing Diagnoses: Imbalanced nutrition Goals: Patient/caregiver agrees to and verbalizes understanding of need to use  nutritional supplements and/or vitamins as prescribed Date Initiated: 05/29/2016 Target Resolution Date: 08/02/2016 Goal Status: Active Interventions: Assess patient nutrition upon admission and as needed per policy Notes: ` Orientation to the Wound Care Program Nursing Diagnoses: DOMINYK, UNTERREINER ([id number]) Knowledge deficit related to the wound healing center program Goals: Patient/caregiver will verbalize understanding of the [facility] Date Initiated: 05/29/2016 Target Resolution Date: 06/14/2016 Goal Status: Active Interventions: Provide education on orientation to the wound center Notes: ` Pain, Acute or Chronic Nursing Diagnoses: Pain, acute or chronic: actual or potential Potential alteration in comfort, pain Goals: Patient will verbalize adequate pain control and receive pain control interventions during procedures as needed Date Initiated: 05/29/2016 Target Resolution Date: 08/02/2016 Goal Status: Active Interventions: Complete pain assessment as per visit requirements Notes: ` Wound/Skin Impairment Nursing Diagnoses: Impaired tissue integrity Knowledge deficit related to ulceration/compromised skin integrity Goals: Ulcer/skin breakdown will have a volume reduction of 80% by week 12 Date Initiated: 05/29/2016 Target Resolution Date: 08/02/2016 Goal Status: Active Interventions: Assess patient/caregiver ability to perform ulcer/skin care regimen upon admission and as needed Assess ulceration(s) every visit Notes: WENDELL, WOLLE ([id number]) Electronic Signature(s) Signed: 07/03/2016 4:53:39 PM By: Montey Hora Entered By: Montey Hora on 07/03/2016 14:52:03 [name] ([id number]) -------------------------------------------------------------------------------- Pain Assessment Details  Patient Name: ULYSIS, NEWLEN. Date of Service: 07/03/2016 2:30 PM Medical Record Number: [id number] Patient Account Number: [id number] Date of  Birth/Sex: Mar 20, 1935 (82 y.o. Male) Treating RN: Montey Hora Primary Care Jerimiah Wolman: [name] Other Clinician: Referring Syon Tews: [name] Treating Patriece Archbold/Extender: [name] in Treatment: 5 Active Problems Location of Pain Severity and Description of Pain Patient Has Paino No Site Locations Pain Management and Medication Current Pain Management: Notes Topical or injectable lidocaine is offered to patient for acute pain when surgical debridement is performed. If needed, Patient is instructed to use over the counter pain medication for the following 24-48 hours after debridement. Wound care MDs do not prescribed pain medications. Patient has chronic pain or uncontrolled pain. Patient has been instructed to make an appointment with their Primary Care Physician for pain management. Electronic Signature(s) Signed: 07/03/2016 4:53:39 PM By: Montey Hora Entered By: Montey Hora on 07/03/2016 14:35:11 [name] ([id number]) -------------------------------------------------------------------------------- Patient/Caregiver Education Details Patient Name: JAKALEB, COMPERE. Date of Service: 07/03/2016 2:30 PM Medical Record Number: [id number] Patient Account Number: [id number] Date of Birth/Gender: 11-Mar-1935 (82 y.o. Male) Treating RN: Montey Hora Primary Care Physician: [name] Other Clinician: Referring Physician: [name] Treating Physician/Extender: [name] in Treatment: 5 Education Assessment Education Provided To: Patient and Caregiver Education Topics Provided Wound/Skin Impairment: Handouts: Other: wound care to continue as ordered Methods: Demonstration, Explain/Verbal Responses: State content correctly Electronic Signature(s) Signed: 07/03/2016 4:53:39 PM By: Montey Hora Entered By: Montey Hora on 07/03/2016 15:05:24 [name]  ([id number]) -------------------------------------------------------------------------------- Wound Assessment Details Patient Name: MOLLIE, CALLEROS. Date of Service: 07/03/2016 2:30 PM Medical Record Number: [id number] Patient Account Number: [id number] Date of Birth/Sex: [birth year] (82 y.o. Male) Treating RN: Montey Hora Primary Care Dairon Procter: [name] Other Clinician: Referring Indiya Izquierdo: [name] Treating Talise Sligh/Extender: [name] in Treatment: 5 Wound Status Wound Number: 1 Primary Pressure Ulcer Etiology: Wound Location: Left Malleolus - Lateral Wound Open Wounding Event: Pressure Injury Status: Date Acquired: 05/01/2016 Comorbid Cataracts, Anemia, Sleep Apnea, Weeks Of Treatment: 5 History: Arrhythmia, Hypertension, Dementia Clustered Wound: No Photos Photo Uploaded By: Montey Hora on 07/03/2016 15:59:33 Wound Measurements Length: (cm) 0.3 Width: (cm) 0.2 Depth: (cm) 0.2 Area: (cm) 0.047 Volume: (cm) 0.009 % Reduction in Area: 76% % Reduction in Volume: 55% Epithelialization: None Tunneling: No Undermining: No Wound Description Classification: Category/Stage II Foul Odor Aft Wound Margin: Distinct, outline attached Slough/Fibrin Exudate Amount: Large Exudate Type: Serous Exudate Color: amber er Cleansing: No o No Wound Bed Granulation Amount: Small (1-33%) Exposed Structure Granulation Quality: Pink Fat Layer (Subcutaneous Tissue) Exposed: Yes Necrotic Amount: Large (67-100%) Necrotic Quality: Eschar, [address] [place]. ([id number]) Periwound Skin Texture Texture Color No Abnormalities Noted: No No Abnormalities Noted: No Erythema: Yes Moisture Erythema Location: Circumferential No Abnormalities Noted: No Temperature / Pain Temperature: No Abnormality Tenderness on Palpation: Yes Wound Preparation Ulcer Cleansing: Rinsed/Irrigated with Saline Topical Anesthetic Applied: Other: lidocaine  4%, Treatment Notes Wound #1 (Left, Lateral Malleolus) 1. Cleansed with: Clean wound with Normal Saline 2. Anesthetic Topical Lidocaine 4% cream to wound bed prior to debridement 4. Dressing Applied: Santyl Ointment 5. Secondary Dressing Applied Bordered Foam Dressing Dry Gauze Electronic Signature(s) Signed: 07/03/2016 4:53:39 PM By: Montey Hora Entered By: Montey Hora on 07/03/2016 14:41:53 [name] ([id number]) -------------------------------------------------------------------------------- Vitals Details Patient Name: AVANISH, WIEMKEN. Date of Service: 07/03/2016 2:30 PM Medical Record Number: [id number] Patient Account Number: [id number] Date of Birth/Sex: Jun 20, 1934 (82 y.o. Male) Treating  RN: Montey Hora Primary Care Leanza Shepperson: [name] Other Clinician: Referring Mac Dowdell: [name] Treating Abiageal Blowe/Extender: [name] in Treatment: 5 Vital Signs Time Taken: 14:35 Temperature (F): 98.1 Height (in): 69 Pulse (bpm): 54 Weight (lbs): 174.8 Respiratory Rate (breaths/min): 18 Body Mass Index (BMI): 25.8 Blood Pressure (mmHg): 134/51 Reference Range: 80 - 120 mg / dl Electronic Signature(s) Signed: 07/03/2016 4:53:39 PM By: Montey Hora Entered By: Montey Hora on 07/03/2016 14:40:06

## 2016-07-04 NOTE — Progress Notes (Signed)
CASSON, GEWIRTZ ([id number]) Visit Report for 07/03/2016 Chief Complaint Document Details Patient Name: [name], [name]. Date of Service: 07/03/2016 2:30 PM Medical Record Number: [id number] Patient Account Number: [id number] Date of Birth/Sex: 1935-03-16 (82 y.o. Male) Treating RN: Montey Hora Primary Care Provider: [name] Other Clinician: Referring Provider: [name] Treating Provider/Extender: [name] in Treatment: 5 Information Obtained from: Patient Chief Complaint Patient is at the clinic for treatment of an open pressure ulcer to the left ankle which she's had for about 5 weeks Electronic Signature(s) Signed: 07/03/2016 2:56:58 PM By: Christin Fudge MD, FACS Entered By: Christin Fudge on 07/03/2016 14:56:58 [name] ([id number]) -------------------------------------------------------------------------------- Debridement Details Patient Name: [name], [name]. Date of Service: 07/03/2016 2:30 PM Medical Record Number: [id number] Patient Account Number: [id number] Date of Birth/Sex: June 29, 1934 (82 y.o. Male) Treating RN: Montey Hora Primary Care Provider: [name] Other Clinician: Referring Provider: [name] Treating Provider/Extender: [name] in Treatment: 5 Debridement Performed for Wound #1 Left,Lateral Malleolus Assessment: Performed By: Physician Christin Fudge, MD Debridement: Debridement Pre-procedure Yes - 14:51 Verification/Time Out Taken: Start Time: 14:51 Pain Control: Lidocaine 4% Topical Solution Level: Skin/Subcutaneous Tissue Total Area Debrided (L x 0.3 (cm) x 0.2 (cm) = 0.06 (cm) W): Tissue and other Viable, Non-Viable, Fibrin/Slough, Subcutaneous material debrided: Instrument: Forceps Bleeding: Minimum Hemostasis Achieved: Pressure End Time: 14:54 Procedural Pain: 0 Post Procedural Pain: 0 Response to Treatment: Procedure was tolerated well Post Debridement Measurements of Total  Wound Length: (cm) 0.3 Stage: Category/Stage II Width: (cm) 0.2 Depth: (cm) 0.2 Volume: (cm) 0.009 Character of Wound/Ulcer Post Improved Debridement: Severity of Tissue Post Fat layer exposed Debridement: Post Procedure Diagnosis Same as Pre-procedure Electronic Signature(s) Signed: 07/03/2016 2:56:53 PM By: Christin Fudge MD, FACS Signed: 07/03/2016 4:53:39 PM By: Montey Hora Entered By: Christin Fudge on 07/03/2016 14:56:53 [name] ([id number]) [name], [name] ([id number]) -------------------------------------------------------------------------------- HPI Details Patient Name: [name], [name]. Date of Service: 07/03/2016 2:30 PM Medical Record Number: [id number] Patient Account Number: [id number] Date of Birth/Sex: [birth year] (82 y.o. Male) Treating RN: Montey Hora Primary Care Provider: [name] Other Clinician: Referring Provider: [name] Treating Provider/Extender: [name] in Treatment: 5 History of Present Illness Location: left ankle laterally Quality: Patient reports experiencing a dull pain to affected area(s). Severity: Patient states wound are getting worse. Duration: Patient has had the wound for < 5 weeks prior to presenting for treatment Timing: Pain in wound is Intermittent (comes and goes Context: The wound appeared gradually over time Modifying Factors: Other treatment(s) tried include:put on Bactroban and doxycycline Associated Signs and Symptoms: Patient reports having difficulty standing for long periods. HPI Description: 82 year old gentleman who is known to have a history of dementia but is fairly functional and is able to ambulate has been having a pressure ulcer to the left ankle due to laying in bed for longer than he should. Past history of atrial fibrillation, dementia, hyperlipidemia, hypertension. He's also had some orthopedic related surgeries in the past and was a former smoker and is given up in 1969.  Recently seen by his PCP who put him on doxycycline and asked him to apply Bactroban and see [place] at the wound clinic. 06/05/2016 -- had a x-ray of the left ankle which showed no acute bony pathology. Electronic Signature(s) Signed: 07/03/2016 2:57:02 PM By: Christin Fudge MD, FACS Entered By: Christin Fudge on 07/03/2016 14:57:02 [name] ([id number]) -------------------------------------------------------------------------------- Physical Exam Details Patient Name: [name], [name]. Date of  Service: 07/03/2016 2:30 PM Medical Record Number: [id number] Patient Account Number: [id number] Date of Birth/Sex: Dec 20, 1934 (82 y.o. Male) Treating RN: Montey Hora Primary Care Provider: [name] Other Clinician: Referring Provider: [name] Treating Provider/Extender: [name] in Treatment: 5 Constitutional . Pulse regular. Respirations normal and unlabored. Afebrile. . Eyes Nonicteric. Reactive to light. Ears, Nose, Mouth, and Throat Lips, teeth, and gums WNL.Marland Kitchen Moist mucosa without lesions. Neck supple and nontender. No palpable supraclavicular or cervical adenopathy. Normal sized without goiter. Respiratory WNL. No retractions.. Cardiovascular Pedal Pulses WNL. No clubbing, cyanosis or edema. Chest Breasts symmetical and no nipple discharge.. Breast tissue WNL, no masses, lumps, or tenderness.. Gastrointestinal (GI) Abdomen without masses or tenderness.. No liver or spleen enlargement or tenderness.. Genitourinary (GU) No hydrocele, spermatocele, tenderness of the cord, or testicular mass.Marland Kitchen Penis without lesions.Lowella Fairy without lesions. No cystocele, or rectocele. Pelvic support intact, no discharge.Marland Kitchen Urethra without masses, tenderness or scarring.Marland Kitchen Lymphatic No adneopathy. No adenopathy. No adenopathy. Musculoskeletal Adexa without tenderness or enlargement.. Digits and nails w/o clubbing, cyanosis, infection, petechiae, ischemia, or inflammatory  conditions.. Integumentary (Hair, Skin) No suspicious lesions. No crepitus or fluctuance. No peri-wound warmth or erythema. No masses.Marland Kitchen Psychiatric Judgement and insight Intact.. No evidence of depression, anxiety, or agitation.. Notes using a toothed forcep I was able to sharply remove a significant amount of subcutaneous debris and had to stop for tenderness Electronic Signature(s) [name], [name] ([id number]) Signed: 07/03/2016 2:57:47 PM By: Christin Fudge MD, FACS Entered By: Christin Fudge on 07/03/2016 14:57:47 [name] ([id number]) -------------------------------------------------------------------------------- Physician Orders Details Patient Name: [name], [name]. Date of Service: 07/03/2016 2:30 PM Medical Record Number: [id number] Patient Account Number: [id number] Date of Birth/Sex: Apr 03, 1935 (82 y.o. Male) Treating RN: Montey Hora Primary Care Provider: [name] Other Clinician: Referring Provider: [name] Treating Provider/Extender: [name] in Treatment: 5 Verbal / Phone Orders: No Diagnosis Coding Wound Cleansing Wound #1 Left,Lateral Malleolus o Clean wound with Normal Saline. o May Shower, gently pat wound dry prior to applying new dressing. Anesthetic Wound #1 Left,Lateral Malleolus o Topical Lidocaine 4% cream applied to wound bed prior to debridement - for clinic use Skin Barriers/Peri-Wound Care Wound #1 Left,Lateral Malleolus o Skin Prep Primary Wound Dressing Wound #1 Left,Lateral Malleolus o Santyl Ointment - IN OFFICE o Medihoney gel Secondary Dressing Wound #1 Left,Lateral Malleolus o Dry Gauze o Boardered Foam Dressing Follow-up Appointments Wound #1 Left,Lateral Malleolus o Return Appointment in 1 week. Edema Control Wound #1 Left,Lateral Malleolus o Elevate legs to the level of the heart and pump ankles as often as possible Off-Loading Wound #1 Left,Lateral Malleolus o Turn and  reposition every 2 hours o Other: - keep pressure off of affected area [name], MEIXNER. ([id number]) Additional Orders / Instructions Wound #1 Left,Lateral Malleolus o Increase protein intake. Medications-please add to medication list. Wound #1 Left,Lateral Malleolus o Other: - Vitamin A, Vitamin C, Zinc, MVI Electronic Signature(s) Signed: 07/03/2016 4:14:08 PM By: Christin Fudge MD, FACS Signed: 07/03/2016 4:53:39 PM By: Montey Hora Entered By: Montey Hora on 07/03/2016 14:54:34 [name] ([id number]) -------------------------------------------------------------------------------- Problem List Details Patient Name: [name], [name]. Date of Service: 07/03/2016 2:30 PM Medical Record Number: [id number] Patient Account Number: [id number] Date of Birth/Sex: 13-Jan-1935 (82 y.o. Male) Treating RN: Montey Hora Primary Care Provider: [name] Other Clinician: Referring Provider: [name] Treating Provider/Extender: [name] in Treatment: 5 Active Problems ICD-10 Encounter Code Description Active Date Diagnosis [phone] Pressure ulcer of left  ankle, stage 2 05/29/2016 Yes F02.80 Dementia in other diseases classified elsewhere without 05/29/2016 Yes behavioral disturbance Inactive Problems Resolved Problems Electronic Signature(s) Signed: 07/03/2016 2:56:34 PM By: Christin Fudge MD, FACS Entered By: Christin Fudge on 07/03/2016 14:56:34 [name] ([id number]) -------------------------------------------------------------------------------- Progress Note Details Patient Name: [name], [name]. Date of Service: 07/03/2016 2:30 PM Medical Record Number: [id number] Patient Account Number: [id number] Date of Birth/Sex: [birth year] (82 y.o. Male) Treating RN: Montey Hora Primary Care Provider: [name] Other Clinician: Referring Provider: [name] Treating Provider/Extender: [name] in Treatment: 5 Subjective Chief  Complaint Information obtained from Patient Patient is at the clinic for treatment of an open pressure ulcer to the left ankle which she's had for about 5 weeks History of Present Illness (HPI) The following HPI elements were documented for the patient's wound: Location: left ankle laterally Quality: Patient reports experiencing a dull pain to affected area(s). Severity: Patient states wound are getting worse. Duration: Patient has had the wound for < 5 weeks prior to presenting for treatment Timing: Pain in wound is Intermittent (comes and goes Context: The wound appeared gradually over time Modifying Factors: Other treatment(s) tried include:put on Bactroban and doxycycline Associated Signs and Symptoms: Patient reports having difficulty standing for long periods. 82 year old gentleman who is known to have a history of dementia but is fairly functional and is able to ambulate has been having a pressure ulcer to the left ankle due to laying in bed for longer than he should. Past history of atrial fibrillation, dementia, hyperlipidemia, hypertension. He's also had some orthopedic related surgeries in the past and was a former smoker and is given up in 1969. Recently seen by his PCP who put him on doxycycline and asked him to apply Bactroban and see [place] at the wound clinic. 06/05/2016 -- had a x-ray of the left ankle which showed no acute bony pathology. Objective Constitutional Pulse regular. Respirations normal and unlabored. Afebrile. Vitals Time Taken: 2:35 PM, Height: 69 in, Weight: 174.8 lbs, BMI: 25.8, Temperature: 98.1 F, Pulse: 54 bpm, Respiratory Rate: 18 breaths/min, Blood Pressure: 134/51 mmHg. [name], [name] ([id number]) Eyes Nonicteric. Reactive to light. Ears, Nose, Mouth, and Throat Lips, teeth, and gums WNL.Marland Kitchen Moist mucosa without lesions. Neck supple and nontender. No palpable supraclavicular or cervical adenopathy. Normal sized without goiter. Respiratory WNL. No  retractions.. Cardiovascular Pedal Pulses WNL. No clubbing, cyanosis or edema. Chest Breasts symmetical and no nipple discharge.. Breast tissue WNL, no masses, lumps, or tenderness.. Gastrointestinal (GI) Abdomen without masses or tenderness.. No liver or spleen enlargement or tenderness.. Genitourinary (GU) No hydrocele, spermatocele, tenderness of the cord, or testicular mass.Marland Kitchen Penis without lesions.Lowella Fairy without lesions. No cystocele, or rectocele. Pelvic support intact, no discharge.Marland Kitchen Urethra without masses, tenderness or scarring.Marland Kitchen Lymphatic No adneopathy. No adenopathy. No adenopathy. Musculoskeletal Adexa without tenderness or enlargement.. Digits and nails w/o clubbing, cyanosis, infection, petechiae, ischemia, or inflammatory conditions.Marland Kitchen Psychiatric Judgement and insight Intact.. No evidence of depression, anxiety, or agitation.. General Notes: using a toothed forcep I was able to sharply remove a significant amount of subcutaneous debris and had to stop for tenderness Integumentary (Hair, Skin) No suspicious lesions. No crepitus or fluctuance. No peri-wound warmth or erythema. No masses.. Wound #1 status is Open. Original cause of wound was Pressure Injury. The wound is located on the Left,Lateral Malleolus. The wound measures 0.3cm length x 0.2cm width x 0.2cm depth; 0.047cm^2 area and 0.009cm^3 volume. There is Fat Layer (Subcutaneous Tissue) Exposed exposed. There is no tunneling  or undermining noted. There is a large amount of serous drainage noted. The wound margin is distinct with the outline attached to the wound base. There is small (1-33%) pink granulation within the wound bed. There is a large (67-100%) amount of necrotic tissue within the wound bed including Eschar and Adherent Slough. The periwound skin appearance exhibited: Erythema. The surrounding wound skin color is noted with erythema which is circumferential. Periwound temperature was noted as No  Abnormality. The [name], [name]. ([id number]) periwound has tenderness on palpation. Assessment Active Problems ICD-10 L89.522 - Pressure ulcer of left ankle, stage 2 F02.80 - Dementia in other diseases classified elsewhere without behavioral disturbance Procedures Wound #1 Wound #1 is a Pressure Ulcer located on the Left,Lateral Malleolus . There was a Skin/Subcutaneous Tissue Debridement [id number]) debridement with total area of 0.06 sq cm performed by Christin Fudge, MD. with the following instrument(s): Forceps to remove Viable and Non-Viable tissue/material including Fibrin/Slough and Subcutaneous after achieving pain control using Lidocaine 4% Topical Solution. A time out was conducted at 14:51, prior to the start of the procedure. A Minimum amount of bleeding was controlled with Pressure. The procedure was tolerated well with a pain level of 0 throughout and a pain level of 0 following the procedure. Post Debridement Measurements: 0.3cm length x 0.2cm width x 0.2cm depth; 0.009cm^3 volume. Post debridement Stage noted as Category/Stage II. Character of Wound/Ulcer Post Debridement is improved. Severity of Tissue Post Debridement is: Fat layer exposed. Post procedure Diagnosis Wound #1: Same as Pre-Procedure Plan Wound Cleansing: Wound #1 Left,Lateral Malleolus: Clean wound with Normal Saline. May Shower, gently pat wound dry prior to applying new dressing. Anesthetic: Wound #1 Left,Lateral Malleolus: Topical Lidocaine 4% cream applied to wound bed prior to debridement - for clinic use Skin Barriers/Peri-Wound Care: Wound #1 Left,Lateral Malleolus: BRAVLIO, MURAD ([id number]) Skin Prep Primary Wound Dressing: Wound #1 Left,Lateral Malleolus: Santyl Ointment - IN OFFICE Medihoney gel Secondary Dressing: Wound #1 Left,Lateral Malleolus: Dry Gauze Boardered Foam Dressing Follow-up Appointments: Wound #1 Left,Lateral Malleolus: Return Appointment in 1 week. Edema  Control: Wound #1 Left,Lateral Malleolus: Elevate legs to the level of the heart and pump ankles as often as possible Off-Loading: Wound #1 Left,Lateral Malleolus: Turn and reposition every 2 hours Other: - keep pressure off of affected area Additional Orders / Instructions: Wound #1 Left,Lateral Malleolus: Increase protein intake. Medications-please add to medication list.: Wound #1 Left,Lateral Malleolus: Other: - Vitamin A, Vitamin C, Zinc, MVI I have recommended: 1. Santyl ointment to be applied daily, and a protective bordered foam. This was not affordable so they're using Medihoney. 2. Off-Loading has been discussed in great detail 3. adequate protein, vitamin A, vitamin C and zinc 4. Regular visits to the wound center Electronic Signature(s) Signed: 07/03/2016 2:58:49 PM By: Christin Fudge MD, FACS Entered By: Christin Fudge on 07/03/2016 14:58:49 [name] ([id number]) -------------------------------------------------------------------------------- SuperBill Details Patient Name: DARTAVIOUS, HORNOR. Date of Service: 07/03/2016 Medical Record Number: [id number] Patient Account Number: [id number] Date of Birth/Sex: [birth year] (82 y.o. Male) Treating RN: Montey Hora Primary Care Provider: [name] Other Clinician: Referring Provider: [name] Treating Provider/Extender: Christin Fudge Service Line: Outpatient Weeks in Treatment: 5 Diagnosis Coding ICD-10 Codes Code Description [phone] Pressure ulcer of left ankle, stage 2 F02.80 Dementia in other diseases classified elsewhere without behavioral disturbance Facility Procedures CPT4: Description Modifier Quantity Code [id number] - DEB SUBQ TISSUE 20 SQ CM/< 1 ICD-10 Description Diagnosis L89.522 Pressure ulcer of left ankle, stage 2 F02.80 Dementia in  other diseases classified elsewhere without behavioral disturbance Physician Procedures CPT4: Description Modifier Quantity Code [id number] - WC PHYS  SUBQ TISS 20 SQ CM 1 ICD-10 Description Diagnosis L89.522 Pressure ulcer of left ankle, stage 2 F02.80 Dementia in other diseases classified elsewhere without behavioral disturbance Electronic Signature(s) Signed: 07/03/2016 2:58:56 PM By: Christin Fudge MD, FACS Entered By: Christin Fudge on 07/03/2016 14:58:56

## 2016-07-07 ENCOUNTER — Ambulatory Visit: Payer: PPO | Admitting: Physical Therapy

## 2016-07-07 DIAGNOSIS — M6281 Muscle weakness (generalized): Secondary | ICD-10-CM | POA: Diagnosis not present

## 2016-07-07 DIAGNOSIS — R2689 Other abnormalities of gait and mobility: Secondary | ICD-10-CM

## 2016-07-07 DIAGNOSIS — R293 Abnormal posture: Secondary | ICD-10-CM

## 2016-07-07 DIAGNOSIS — R296 Repeated falls: Secondary | ICD-10-CM

## 2016-07-07 NOTE — Therapy (Signed)
[facility] [address] [place], [place], [address] Phone: [phone]   Fax:  [phone]  Physical Therapy Treatment  Patient Details  Name: [name] MRN: [id number] Date of Birth: 16-May-1934 Referring Provider: Jennings Books, MD  Encounter Date: 07/07/2016      PT End of Session - 07/07/16 1452    Visit Number 8   Number of Visits 8   Date for PT Re-Evaluation 07/10/16   Authorization - Visit Number 8   Authorization - Number of Visits 10   PT Start Time 1344   PT Stop Time 1440   PT Time Calculation (min) 56 min   Equipment Utilized During Treatment Gait belt   Activity Tolerance Patient tolerated treatment well   Behavior During Therapy [facility] for tasks assessed/performed      Past Medical History:  Diagnosis Date  . Absolute anemia 09/11/2014  . Anxiety   . Atrial fibrillation ([place]) 01/23/2014  . BP (high blood pressure) 11/07/2013  . Carotid artery disease ([place]) 09/11/2014  . Complete rotator cuff rupture of left shoulder 12/30/2013  . Dementia 09/11/2014  . Depression   . Heart murmur   . HLD (hyperlipidemia) 01/23/2014  . Hypertension   . Obstructive apnea 11/07/2013  . Personal history of other diseases of the circulatory system 01/23/2014  . Sleep apnea   . 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 Back Surgery- Tumor Removal    . SHOULDER SURGERY Right 2007    There were no vitals filed for this visit.      Subjective Assessment - 07/07/16 1346    Subjective Patient had chest pains after last week due to pec weakness from floor recovery activities.    Patient is accompained by: Family member   Pertinent History Hx of left knee pain, low back pain, shoulder pain. Hx of falls, dementia, and teretoma of the spine   Limitations Sitting;Lifting;Standing;Walking;House hold activities   Diagnostic tests BERG   Patient Stated Goals get stronger, pick up  legs better when walking, getting up easier and faster   Currently in Pain? Yes   Pain Score 3    Pain Location Back   Pain Orientation Lower   Pain Descriptors / Indicators Aching;Discomfort      Gait: Ambulating 31ft x3 with SPC with cues for assistive device body  Mechanics, step length, upright posture, and CGA. Ambulating was improved with SPC.  Ambulating outside on unsteady surface with SPC and no LOB. Ambulating down decline ramp to car with cues for cane placement.  Cone figure 8 with SPC to challenge turning. Pt. Required frequent cues for cone placement frequently overshooting. Turning was slower with decreased step length, festination, and increased trunk flexion.  Neuro Re-ed: Airex pad: 2x30 seconds no cane, 1x30 seconds with cane to demonstrate improved balance with device. Sit to stand walk over 3" step and sit to stand again (between two chairs 5 ft apart) x6 Stepping over 3" step in // bars with cane. Cues for cane sequencing x 6. Stepping over two subsequent 3" steps in // bars with cane. Cues for cane sequencing.   Therex: Therex ball standing thoracic postural roll out and back 15x standing next to plinth. Standing hamstring stretch 1x60 seconds.   PROM  Hamstring stretch 1x60 seconds, piriformis stretch 1x45 seconds, popliteal hamstring stretch 1x45 seconds.     Pt response to medical necessity: Patient will  benefit from skilled physical therapy to improve gait mechanics, balance, decrease falls, improve gross strength and posture.          PT Education - 07/07/16 1452    Education provided Yes   Education Details cane ambulation   Person(s) Educated Patient;Spouse   Methods Explanation;Demonstration   Comprehension Verbalized understanding;Returned demonstration             PT Long Term Goals - 06/26/16 1415      PT LONG TERM GOAL #1   Title Patient will increase score on Berg Balance to 49/56 for decreased fall risk.    Baseline 2/8:  44/56   Time 4   Period Weeks   Status New     PT LONG TERM GOAL #2   Title Patient will have gross 4/5 bilateral LE strength to increase ability to perform functional activities.    Baseline Patient has a gross LLE 3+/5, RLE 4-/5   Time 4   Period Weeks   Status New     PT LONG TERM GOAL #3   Title Patient will ambulate 5 minutes with a cane without LOB to increase community mobility.     Baseline Patient fatigues quickly.    Time 4   Period Weeks   Status New     PT LONG TERM GOAL #4   Title Patient will perform 10 sit to stands without use of hands or LOB to demonstrate improved capacity for functional activities.    Baseline Patient can perform 1 sit to stand without hands.    Time 4   Period Weeks   Status New     PT LONG TERM GOAL #5   Title Patient will have upright posture for 10 minutes to allow for better body mechanics and decreased back pain.    Baseline Patient requires max cueing for upright posture   Time 4   Period Weeks   Status New     Additional Long Term Goals   Additional Long Term Goals Yes     PT LONG TERM GOAL #6   Title Patient will not have SI/back pain while ambulating to allow for increased community mobility.    Baseline Patient has occasional 5/10 back pain while ambulating   Time 4   Period Weeks   Status New            Plan - 07/07/16 1457    Clinical Impression Statement Pt. was educated on and performed ambulation with single point cane. Gait mechanics were improved with single point cane with increased upright posture and larger step length. Narrow base of support with ambulation continues to affect dynamic balance. Functional SPC ambulation was challenged outside and over 3" steps to improve community ambulation. Frequent rest breaks were retired due to pt. fatigue. Hamstring length is limited bilaterally. Patient will benefit from continued skilled physical therapy to decrease fall risk, improve gross strength and ROM, control  pain and allow patient safe ambulation in the community for improved quality of life.    Rehab Potential Fair   Clinical Impairments Affecting Rehab Potential hx of falls, dementia, teratoma, mitral valve polapse, cancer, a fib, L knee injury, L shoulder, back pain   PT Frequency 2x / week   PT Duration 4 weeks   PT Treatment/Interventions ADLs/Self Care Home Management;Aquatic Therapy;Cryotherapy;Electrical Stimulation;Biofeedback;Moist Heat;DME Instruction;Gait training;Stair training;Functional mobility training;Therapeutic activities;Therapeutic exercise;Balance training;Neuromuscular re-education;Patient/family education;Orthotic Fit/Training;Manual techniques;Passive range of motion;Dry needling;Energy conservation   PT Next Visit Plan CANE OUTSIDE.   RECERT vs.  DISCHARGE next tx.     PT Home Exercise Plan see sheet   Consulted and Agree with Plan of Care Patient;Family member/caregiver   Family Member Consulted wife      Patient will benefit from skilled therapeutic intervention in order to improve the following deficits and impairments:  Abnormal gait, Decreased activity tolerance, Decreased balance, Decreased mobility, Decreased knowledge of use of DME, Decreased endurance, Decreased range of motion, Decreased safety awareness, Decreased skin integrity, Decreased knowledge of precautions, Decreased strength, Hypomobility, Difficulty walking, Impaired flexibility, Postural dysfunction, Improper body mechanics, Decreased cognition  Visit Diagnosis: Muscle weakness (generalized)  Other abnormalities of gait and mobility  Repeated falls  Abnormal posture     Problem List Patient Active Problem List   Diagnosis Date Noted  . Fall 02/18/2016  . Scalp laceration 02/18/2016  . Splenic laceration 02/18/2016  . Acute blood loss anemia 02/18/2016  . Multiple rib fractures 02/16/2016  . Mild dementia 04/10/2015  . Aggrieved 03/02/2015  . Absolute anemia 09/11/2014  . Carotid artery  disease ([place]) 09/11/2014  . Dementia 09/11/2014  . History of repair of rotator cuff 03/02/2014  . Atrial fibrillation with RVR ([place]) 01/23/2014  . Personal history of other diseases of the circulatory system 01/23/2014  . HLD (hyperlipidemia) 01/23/2014  . Complete rotator cuff rupture of left shoulder 12/30/2013  . BP (high blood pressure) 11/07/2013  . Amnesia 11/07/2013  . Obstructive apnea 11/07/2013   Pura Spice, PT, DPT # [id number] Janna Arch, SPT 07/08/2016, 8:02 AM  [facility] [address] [place], [place], [address] Phone: [phone]   Fax:  [phone]  Name: [name] MRN: [id number] Date of Birth: [birth year]

## 2016-07-09 ENCOUNTER — Ambulatory Visit: Payer: PPO | Admitting: Physical Therapy

## 2016-07-09 DIAGNOSIS — R293 Abnormal posture: Secondary | ICD-10-CM

## 2016-07-09 DIAGNOSIS — R2689 Other abnormalities of gait and mobility: Secondary | ICD-10-CM

## 2016-07-09 DIAGNOSIS — M6281 Muscle weakness (generalized): Secondary | ICD-10-CM

## 2016-07-09 DIAGNOSIS — R296 Repeated falls: Secondary | ICD-10-CM

## 2016-07-09 NOTE — Therapy (Addendum)
[facility] [address] [place], [place], [address] Phone: [phone]   Fax:  [phone]  Physical Therapy Treatment  Patient Details  Name: [name] MRN: [id number] Date of Birth: [birth year] Referring Provider: Jennings Books, MD  Encounter Date: 07/09/2016      PT End of Session - 07/09/16 1536    Visit Number 9   Number of Visits 16   Date for PT Re-Evaluation 08/06/16   Authorization - Visit Number 9   Authorization - Number of Visits 18   PT Start Time 7353   PT Stop Time 1341   PT Time Calculation (min) 55 min   Equipment Utilized During Treatment Gait belt   Activity Tolerance Patient tolerated treatment well   Behavior During Therapy [facility] for tasks assessed/performed      Past Medical History:  Diagnosis Date  . Absolute anemia 09/11/2014  . Anxiety   . Atrial fibrillation ([place]) 01/23/2014  . BP (high blood pressure) 11/07/2013  . Carotid artery disease ([place]) 09/11/2014  . Complete rotator cuff rupture of left shoulder 12/30/2013  . Dementia 09/11/2014  . Depression   . Heart murmur   . HLD (hyperlipidemia) 01/23/2014  . Hypertension   . Obstructive apnea 11/07/2013  . Personal history of other diseases of the circulatory system 01/23/2014  . Sleep apnea   . Teratoma, malignant ([place])    lower back tumor    Past Surgical History:  Procedure Laterality Date  . Back Surgery- Tumor Removal    . SHOULDER SURGERY Right 2007    There were no vitals filed for this visit.      Subjective Assessment - 07/09/16 1534    Subjective Patient presented to physical therapy session with [facility] and reports walking futher at home with cane and is standing upright more with cane.    Patient is accompained by: Family member   Pertinent History Hx of left knee pain, low back pain, shoulder pain. Hx of falls, dementia, and teretoma of the spine   Limitations Sitting;Lifting;Standing;Walking;House hold activities   Diagnostic tests  BERG   Patient Stated Goals get stronger, pick up legs better when walking, getting up easier and faster   Currently in Pain? No/denies     Neuro: BERG: 50/56 Single limb stance: L 6 seconds, R: 7 seconds  Stepping over 6" step with cane 4x with cues for cane placement and sequence. Stepping forward lunging in // bars with arms out  TherEx   Side stepping over 3" step with decreased UE support (bilateral to single) to mimic stepping into shower, patient required frequent cueing for posture Sit to stand without UE support 10x with decreased eccentric control with fatigue.  3lb weights: cross body swinging 10x, shrugs 10x  Gait:  Ambulating 50ft x2 with SPC with cues for assistive device body  Mechanics, step length, upright posture, and CGA. Ambulating was improved with SPC.  Ambulating outside on unsteady surface with SPC and no LOB. Ambulating down/ up decline ramp to car with cues for cane placement 6x . Needed frequent reminders of lifting feet higher.     Pt. Response to medical necessity: patient has demonstrated improved strength and balance but continues to have deficits affecting activities of daily living, ambulatory abilities and posture. Patient will benefit from continued skilled physical therapy to improve safe ambulation and quality of life.          PT Education - 07/09/16 1536    Education provided Yes  Education Details transfers into and out of shower in clinical setting   Person(s) Educated Patient;Spouse   Methods Explanation;Demonstration   Comprehension Verbalized understanding;Returned demonstration             PT Long Term Goals - 07/09/16 1703      PT LONG TERM GOAL #1   Title Patient will increase score on Berg Balance to 49/56 for decreased fall risk.    Baseline 3/7: 50/56 ; 2/8: 44/56   Time 4   Period Weeks   Status Achieved     PT LONG TERM GOAL #2   Title Patient will have gross 4/5 bilateral LE strength to increase ability to  perform functional activities.    Baseline Gross LLE 4-/5 knee flexion 3+/5, RLE 4/5   Time 4   Period Weeks   Status On-going     PT LONG TERM GOAL #3   Title Patient will ambulate 5 minutes with a cane without LOB to increase community mobility.     Baseline Patient able to walk 5 minutes with cane but with poor posture.    Time 4   Period Weeks   Status Achieved     PT LONG TERM GOAL #4   Title Patient will perform 10 sit to stands without use of hands or LOB to demonstrate improved capacity for functional activities.    Baseline Patient can perform 10 sit to stands with no hands with decreased eccentric control with fatigue.    Time 4   Period Weeks   Status Achieved     PT LONG TERM GOAL #5   Title Patient will have upright posture for 10 minutes to allow for better body mechanics and decreased back pain.    Baseline Patient requires cueing ~ every 2 minutes   Time 4   Period Weeks   Status On-going     Additional Long Term Goals   Additional Long Term Goals Yes     PT LONG TERM GOAL #6   Title Patient will not have SI/back pain while ambulating to allow for increased community mobility.    Baseline Patient has occasional 5/10 back pain while ambulating   Time 4   Period Weeks   Status On-going     PT LONG TERM GOAL #7   Title Patient will perform 10 sit to stands from surface lower than PT room chairs to increase independence in home setting due to low height of furniture at home.    Baseline Patient can perform 10 sit to stands with fatigue from chair height, able to perform 1 with hands on knees from lowered surface.    Time 4   Period Weeks   Status New     PT LONG TERM GOAL #8   Title Patient will score a 52/56 on Berg to decrease fall risk to minimal and increase single limb abilities to improve carryover to household activities such as stepping into shower.    Baseline 3/7: 50/56, extreme difficulty stepping into shower   Time 4   Period Weeks   Status New                Plan - 07/09/16 1702    Clinical Impression Statement Patient presented to physical therapy session with single point cane and improved capacity for ambulation. Gait mechanics are improving with utilization of single point cane allowing increased upright posture and larger step length. Occasional-frequent cueing still required for static posture and longer duration ambulation due to preference for  trunk flexion.  Patient has demonstrated improved balance with Berg Balance improving to 50/56, a 6 point improvement from evaluation. Single limb balance, tandem balance and turning in a circle is challenging to patient at this time. Patient is able to perform 10 sit to stands without using hands but is not controlled on decent and would benefit from sit to stand interventions from a variety of surfaces and heights to mimic pt.'s natural environment. Pt. continues to struggle with single leg dynamic activities such as stepping into a shower or over a step. Patient would benefit from continued skilled physical therapy to continue progressing balance, decrease fall risk, increase gross strength and posture, and increase independence is safe mobility in home environment for improved quality of life.    Rehab Potential Fair   Clinical Impairments Affecting Rehab Potential hx of falls, dementia, teratoma, mitral valve polapse, cancer, a fib, L knee injury, L shoulder, back pain   PT Frequency 2x / week   PT Duration 4 weeks   PT Treatment/Interventions ADLs/Self Care Home Management;Aquatic Therapy;Cryotherapy;Electrical Stimulation;Biofeedback;Moist Heat;DME Instruction;Gait training;Stair training;Functional mobility training;Therapeutic activities;Therapeutic exercise;Balance training;Neuromuscular re-education;Patient/family education;Orthotic Fit/Training;Manual techniques;Passive range of motion;Dry needling;Energy conservation   PT Next Visit Plan cane outside, side stepping over 6",  shower step practice, sit to stand from varying heights and surfaces   PT Home Exercise Plan see sheet   Consulted and Agree with Plan of Care Patient;Family member/caregiver   Family Member Consulted wife      Patient will benefit from skilled therapeutic intervention in order to improve the following deficits and impairments:  Abnormal gait, Decreased activity tolerance, Decreased balance, Decreased mobility, Decreased knowledge of use of DME, Decreased endurance, Decreased range of motion, Decreased safety awareness, Decreased skin integrity, Decreased knowledge of precautions, Decreased strength, Hypomobility, Difficulty walking, Impaired flexibility, Postural dysfunction, Improper body mechanics, Decreased cognition  Visit Diagnosis: Muscle weakness (generalized)  Other abnormalities of gait and mobility  Repeated falls  Abnormal posture       G-Codes - 2016-08-08 0858    Functional Assessment Tool Used (Outpatient Only) Clinical impression/ gait difficulty/ balance issues/ Berg/ muscle weakness   Functional Limitation Mobility: Walking and moving around   Mobility: Walking and Moving Around Current Status ([id number]) At least 20 percent but less than 40 percent impaired, limited or restricted   Mobility: Walking and Moving Around Goal Status [phone]) At least 1 percent but less than 20 percent impaired, limited or restricted      Problem List Patient Active Problem List   Diagnosis Date Noted  . Fall 02/18/2016  . Scalp laceration 02/18/2016  . Splenic laceration 02/18/2016  . Acute blood loss anemia 02/18/2016  . Multiple rib fractures 02/16/2016  . Mild dementia 04/10/2015  . Aggrieved 03/02/2015  . Absolute anemia 09/11/2014  . Carotid artery disease ([place]) 09/11/2014  . Dementia 09/11/2014  . History of repair of rotator cuff 03/02/2014  . Atrial fibrillation with RVR ([place]) 01/23/2014  . Personal history of other diseases of the circulatory system 01/23/2014  . HLD  (hyperlipidemia) 01/23/2014  . Complete rotator cuff rupture of left shoulder 12/30/2013  . BP (high blood pressure) 11/07/2013  . Amnesia 11/07/2013  . Obstructive apnea 11/07/2013   Pura Spice, PT, DPT # [place], SPT 2016/08/08, 9:00 AM  [facility] [address] [place], [place], [address] Phone: [phone]   Fax:  [phone]  Name: LEELYN JASINSKI MRN: [id number] Date of Birth: May 25, 1934

## 2016-07-11 ENCOUNTER — Encounter: Payer: PPO | Admitting: Surgery

## 2016-07-11 DIAGNOSIS — L89523 Pressure ulcer of left ankle, stage 3: Secondary | ICD-10-CM | POA: Diagnosis not present

## 2016-07-11 DIAGNOSIS — L89522 Pressure ulcer of left ankle, stage 2: Secondary | ICD-10-CM | POA: Diagnosis not present

## 2016-07-12 NOTE — Progress Notes (Signed)
SARIM, ROTHMAN ([id number]) Visit Report for 07/11/2016 Arrival Information Details Patient Name: [name], [name]. Date of Service: 07/11/2016 1:30 PM Medical Record Number: [id number] Patient Account Number: [id number] Date of Birth/Sex: [birth year] (82 y.o. Male) Treating RN: Ahmed Prima Primary Care Sergey Ishler: [name] Other Clinician: Referring Lamara Brecht: [name] Treating Niema Carrara/Extender: [name] in Treatment: 6 Visit Information History Since Last Visit All ordered tests and consults were completed: No Patient Arrived: Ambulatory Added or deleted any medications: No Arrival Time: 13:34 Any new allergies or adverse reactions: No Accompanied By: wife Had a fall or experienced change in No Transfer Assistance: EasyPivot activities of daily living that may affect Patient Lift risk of falls: Patient Identification Verified: Yes Signs or symptoms of abuse/neglect since last No Secondary Verification Process Yes visito Completed: Hospitalized since last visit: No Patient Requires Transmission- No Has Dressing in Place as Prescribed: Yes Based Precautions: Pain Present Now: No Patient Has Alerts: Yes Patient Alerts: ASA Electronic Signature(s) Signed: 07/11/2016 5:07:14 PM By: Alric Quan Entered By: Alric Quan on 07/11/2016 13:36:07 [name] ([id number]) -------------------------------------------------------------------------------- Clinic Level of Care Assessment Details Patient Name: [name]. Date of Service: 07/11/2016 1:30 PM Medical Record Number: [id number] Patient Account Number: [id number] Date of Birth/Sex: 1935/04/01 (82 y.o. Male) Treating RN: Ahmed Prima Primary Care Deano Tomaszewski: [name] Other Clinician: Referring Cedar Ditullio: [name] Treating Lyndal Alamillo/Extender: [name] in Treatment: 6 Clinic Level of Care Assessment Items TOOL 4 Quantity Score X - Use when only an EandM is  performed on FOLLOW-UP visit 1 0 ASSESSMENTS - Nursing Assessment / Reassessment X - Reassessment of Co-morbidities (includes updates in patient status) 1 10 X - Reassessment of Adherence to Treatment Plan 1 5 ASSESSMENTS - Wound and Skin Assessment / Reassessment X - Simple Wound Assessment / Reassessment - one wound 1 5 []  - Complex Wound Assessment / Reassessment - multiple wounds 0 []  - Dermatologic / Skin Assessment (not related to wound area) 0 ASSESSMENTS - Focused Assessment []  - Circumferential Edema Measurements - multi extremities 0 []  - Nutritional Assessment / Counseling / Intervention 0 []  - Lower Extremity Assessment (monofilament, tuning fork, pulses) 0 []  - Peripheral Arterial Disease Assessment (using hand held doppler) 0 ASSESSMENTS - Ostomy and/or Continence Assessment and Care []  - Incontinence Assessment and Management 0 []  - Ostomy Care Assessment and Management (repouching, etc.) 0 PROCESS - Coordination of Care X - Simple Patient / Family Education for ongoing care 1 15 []  - Complex (extensive) Patient / Family Education for ongoing care 0 []  - Staff obtains Programmer, systems, Records, Test Results / Process Orders 0 []  - Staff telephones HHA, Nursing Homes / Clarify orders / etc 0 []  - Routine Transfer to another Facility (non-emergent condition) 0 [name], [name]. ([id number]) []  - Routine Hospital Admission (non-emergent condition) 0 []  - New Admissions / Biomedical engineer / Ordering NPWT, Apligraf, etc. 0 []  - Emergency Hospital Admission (emergent condition) 0 X - Simple Discharge Coordination 1 10 []  - Complex (extensive) Discharge Coordination 0 PROCESS - Special Needs []  - Pediatric / Minor Patient Management 0 []  - Isolation Patient Management 0 []  - Hearing / Language / Visual special needs 0 []  - Assessment of Community assistance (transportation, D/C planning, etc.) 0 []  - Additional assistance / Altered mentation 0 []  - Support Surface(s)  Assessment (bed, cushion, seat, etc.) 0 INTERVENTIONS - Wound Cleansing / Measurement X - Simple Wound Cleansing - one wound 1 5 []  - Complex Wound Cleansing -  multiple wounds 0 X - Wound Imaging (photographs - any number of wounds) 1 5 []  - Wound Tracing (instead of photographs) 0 X - Simple Wound Measurement - one wound 1 5 []  - Complex Wound Measurement - multiple wounds 0 INTERVENTIONS - Wound Dressings X - Small Wound Dressing one or multiple wounds 1 10 []  - Medium Wound Dressing one or multiple wounds 0 []  - Large Wound Dressing one or multiple wounds 0 X - Application of Medications - topical 1 5 []  - Application of Medications - injection 0 INTERVENTIONS - Miscellaneous []  - External ear exam 0 TEGAN, BURNSIDE. ([id number]) []  - Specimen Collection (cultures, biopsies, blood, body fluids, etc.) 0 []  - Specimen(s) / Culture(s) sent or taken to Lab for analysis 0 []  - Patient Transfer (multiple staff / [name] / Similar devices) 0 []  - Simple Staple / Suture removal (25 or less) 0 []  - Complex Staple / Suture removal (26 or more) 0 []  - Hypo / Hyperglycemic Management (close monitor of Blood Glucose) 0 []  - Ankle / Brachial Index (ABI) - do not check if billed separately 0 X - Vital Signs 1 5 Has the patient been seen at the hospital within the last three years: Yes Total Score: 80 Level Of Care: New/Established - Level 3 Electronic Signature(s) Signed: 07/11/2016 5:07:14 PM By: Alric Quan Entered By: Alric Quan on 07/11/2016 16:29:21 [name] ([id number]) -------------------------------------------------------------------------------- Encounter Discharge Information Details Patient Name: [name], [name]. Date of Service: 07/11/2016 1:30 PM Medical Record Number: [id number] Patient Account Number: [id number] Date of Birth/Sex: 1935/03/19 (82 y.o. Male) Treating RN: Ahmed Prima Primary Care Sagar Tengan: [name] Other Clinician: Referring  Ijeoma Loor: [name] Treating Dyesha Henault/Extender: [name] in Treatment: 6 Encounter Discharge Information Items Discharge Pain Level: 0 Discharge Condition: Stable Ambulatory Status: Ambulatory Discharge Destination: Home Transportation: Private Auto Accompanied By: wife Schedule Follow-up Appointment: Yes Medication Reconciliation completed and provided to Patient/Care No Kapri Nero: Provided on Clinical Summary of Care: 07/11/2016 Form Type Recipient Paper Patient ET Electronic Signature(s) Signed: 07/11/2016 1:52:02 PM By: Ruthine Dose Entered By: Ruthine Dose on 07/11/2016 13:52:02 [name] ([id number]) -------------------------------------------------------------------------------- Lower Extremity Assessment Details Patient Name: [name]. Date of Service: 07/11/2016 1:30 PM Medical Record Number: [id number] Patient Account Number: [id number] Date of Birth/Sex: 1934/12/04 (82 y.o. Male) Treating RN: Ahmed Prima Primary Care Yamir Carignan: [name] Other Clinician: Referring Ryman Rathgeber: [name] Treating Mirabella Hilario/Extender: [name] in Treatment: 6 Vascular Assessment Pulses: Dorsalis Pedis Palpable: [Left:Yes] Posterior Tibial Extremity colors, hair growth, and conditions: Extremity Color: [Left:Normal] Temperature of Extremity: [Left:Warm] Capillary Refill: [Left:< 3 seconds] Electronic Signature(s) Signed: 07/11/2016 5:07:14 PM By: Alric Quan Entered By: Alric Quan on 07/11/2016 13:41:19 [name] ([id number]) -------------------------------------------------------------------------------- Multi Wound Chart Details Patient Name: [name]. Date of Service: 07/11/2016 1:30 PM Medical Record Number: [id number] Patient Account Number: [id number] Date of Birth/Sex: 1934-11-14 (82 y.o. Male) Treating RN: Ahmed Prima Primary Care Victorious Kundinger: [name] Other Clinician: Referring  Niamh Rada: [name] Treating Naleigha Raimondi/Extender: [name] in Treatment: 6 Vital Signs Height(in): 69 Pulse(bpm): 53 Weight(lbs): 174.8 Blood Pressure 142/60 (mmHg): Body Mass Index(BMI): 26 Temperature(F): Respiratory Rate 18 (breaths/min): Photos: [1:No Photos] [N/A:N/A] Wound Location: [1:Left Malleolus - Lateral] [N/A:N/A] Wounding Event: [1:Pressure Injury] [N/A:N/A] Primary Etiology: [1:Pressure Ulcer] [N/A:N/A] Comorbid History: [1:Cataracts, Anemia, Sleep Apnea, Arrhythmia, Hypertension, Dementia] [N/A:N/A] Date Acquired: [1:05/01/2016] [N/A:N/A] Weeks of Treatment: [1:6] [N/A:N/A] Wound Status: [1:Open] [N/A:N/A] Measurements L x W x D  0.4x0.3x0.2 [N/A:N/A] (cm) Area (cm) : [1:0.094] [N/A:N/A] Volume (cm) : [1:0.019] [N/A:N/A] % Reduction in Area: [1:52.00%] [N/A:N/A] % Reduction in Volume: 5.00% [N/A:N/A] Classification: [1:Category/Stage II] [N/A:N/A] Exudate Amount: [1:Large] [N/A:N/A] Exudate Type: [1:Serous] [N/A:N/A] Exudate Color: [1:amber] [N/A:N/A] Wound Margin: [1:Distinct, outline attached] [N/A:N/A] Granulation Amount: [1:None Present (0%)] [N/A:N/A] Necrotic Amount: [1:Large (67-100%)] [N/A:N/A] Necrotic Tissue: [1:Eschar, Adherent Slough] [N/A:N/A] Exposed Structures: [1:Fat Layer (Subcutaneous Tissue) Exposed: Yes] [N/A:N/A] Epithelialization: [1:None] [N/A:N/A] Periwound Skin Texture: No Abnormalities Noted [1:No Abnormalities Noted] [N/A:N/A N/A] Periwound Skin Moisture: Periwound Skin Color: Erythema: Yes N/A N/A Erythema Location: Circumferential N/A N/A Temperature: No Abnormality N/A N/A Tenderness on Yes N/A N/A Palpation: Wound Preparation: Ulcer Cleansing: N/A N/A Rinsed/Irrigated with Saline Topical Anesthetic Applied: Other: lidocaine 4% Treatment Notes Wound #1 (Left, Lateral Malleolus) 1. Cleansed with: Clean wound with Normal Saline 2. Anesthetic Topical Lidocaine 4% cream to wound bed prior to  debridement 3. Peri-wound Care: Skin Prep 4. Dressing Applied: Iodosorb Ointment 5. Secondary Dressing Applied Bordered Foam Dressing Dry Gauze Electronic Signature(s) Signed: 07/11/2016 1:48:25 PM By: Christin Fudge MD, FACS Entered By: Christin Fudge on 07/11/2016 13:48:25 [name], [name] ([id number]) -------------------------------------------------------------------------------- [place] Details Patient Name: [name], [name]. Date of Service: 07/11/2016 1:30 PM Medical Record Number: [id number] Patient Account Number: [id number] Date of Birth/Sex: 08-Dec-1934 (82 y.o. Male) Treating RN: Ahmed Prima Primary Care Marlies Ligman: [name] Other Clinician: Referring Jaidyn Usery: [name] Treating Abbie Jablon/Extender: [name] in Treatment: 6 Active Inactive ` Abuse / Safety / Falls / Self Care Management Nursing Diagnoses: Potential for falls Goals: Patient will remain injury free Date Initiated: 05/29/2016 Target Resolution Date: 08/02/2016 Goal Status: Active Interventions: Assess fall risk on admission and as needed Assess impairment of mobility on admission and as needed per policy Notes: ` Nutrition Nursing Diagnoses: Imbalanced nutrition Goals: Patient/caregiver agrees to and verbalizes understanding of need to use nutritional supplements and/or vitamins as prescribed Date Initiated: 05/29/2016 Target Resolution Date: 08/02/2016 Goal Status: Active Interventions: Assess patient nutrition upon admission and as needed per policy Notes: ` Orientation to the Wound Care Program Nursing Diagnoses: [name], [name] ([id number]) Knowledge deficit related to the wound healing center program Goals: Patient/caregiver will verbalize understanding of the [place] [facility] Date Initiated: 05/29/2016 Target Resolution Date: 06/14/2016 Goal Status: Active Interventions: Provide education on orientation to the wound  center Notes: ` Pain, Acute or Chronic Nursing Diagnoses: Pain, acute or chronic: actual or potential Potential alteration in comfort, pain Goals: Patient will verbalize adequate pain control and receive pain control interventions during procedures as needed Date Initiated: 05/29/2016 Target Resolution Date: 08/02/2016 Goal Status: Active Interventions: Complete pain assessment as per visit requirements Notes: ` Wound/Skin Impairment Nursing Diagnoses: Impaired tissue integrity Knowledge deficit related to ulceration/compromised skin integrity Goals: Ulcer/skin breakdown will have a volume reduction of 80% by week 12 Date Initiated: 05/29/2016 Target Resolution Date: 08/02/2016 Goal Status: Active Interventions: Assess patient/caregiver ability to perform ulcer/skin care regimen upon admission and as needed Assess ulceration(s) every visit Notes: [name], [name] ([id number]) Electronic Signature(s) Signed: 07/11/2016 5:07:14 PM By: Alric Quan Entered By: Alric Quan on 07/11/2016 13:41:23 [name] ([id number]) -------------------------------------------------------------------------------- Pain Assessment Details Patient Name: [name]. Date of Service: 07/11/2016 1:30 PM Medical Record Number: [id number] Patient Account Number: [id number] Date of Birth/Sex: 1934-11-10 (82 y.o. Male) Treating RN: Ahmed Prima Primary Care Tyree Fluharty: [name] Other Clinician: Referring Browning Southwood: [name] Treating Jobie Popp/Extender: [name] in Treatment: 6 Active Problems Location of  Pain Severity and Description of Pain Patient Has Paino No Site Locations With Dressing Change: No Pain Management and Medication Current Pain Management: Electronic Signature(s) Signed: 07/11/2016 5:07:14 PM By: Alric Quan Entered By: Alric Quan on 07/11/2016 13:36:14 [name]  ([id number]) -------------------------------------------------------------------------------- Patient/Caregiver Education Details Patient Name: [name], [name]. Date of Service: 07/11/2016 1:30 PM Medical Record Number: [id number] Patient Account Number: [id number] Date of Birth/Gender: [birth year] (83 y.o. Male) Treating RN: Ahmed Prima Primary Care Physician: [name] Other Clinician: Referring Physician: [name] Treating Physician/Extender: [name] in Treatment: 6 Education Assessment Education Provided To: Patient Education Topics Provided Wound/Skin Impairment: Handouts: Other: change dressing as ordered Methods: Demonstration, Explain/Verbal Responses: State content correctly Electronic Signature(s) Signed: 07/11/2016 5:07:14 PM By: Alric Quan Entered By: Alric Quan on 07/11/2016 13:44:55 [name] ([id number]) -------------------------------------------------------------------------------- Wound Assessment Details Patient Name: [name], [name]. Date of Service: 07/11/2016 1:30 PM Medical Record Number: [id number] Patient Account Number: [id number] Date of Birth/Sex: [birth year] (82 y.o. Male) Treating RN: Ahmed Prima Primary Care Denaya Horn: [name] Other Clinician: Referring Braelynne Garinger: [name] Treating Shaliah Wann/Extender: [name] in Treatment: 6 Wound Status Wound Number: 1 Primary Pressure Ulcer Etiology: Wound Location: Left Malleolus - Lateral Wound Open Wounding Event: Pressure Injury Status: Date Acquired: 05/01/2016 Comorbid Cataracts, Anemia, Sleep Apnea, Weeks Of Treatment: 6 History: Arrhythmia, Hypertension, Dementia Clustered Wound: No Photos Photo Uploaded By: Alric Quan on 07/11/2016 16:44:38 Wound Measurements Length: (cm) 0.4 Width: (cm) 0.3 Depth: (cm) 0.2 Area: (cm) 0.094 Volume: (cm) 0.019 % Reduction in Area: 52% % Reduction in Volume:  5% Epithelialization: None Tunneling: No Undermining: No Wound Description Classification: Category/Stage II Foul Odor Afte Wound Margin: Distinct, outline attached Slough/Fibrino Exudate Amount: Large Exudate Type: Serous Exudate Color: amber r Cleansing: No No Wound Bed Granulation Amount: None Present (0%) Exposed Structure Necrotic Amount: Large (67-100%) Fat Layer (Subcutaneous Tissue) Exposed: Yes Necrotic Quality: Eschar, Adherent [address] [employer], [place]. ([id number]) Texture Color No Abnormalities Noted: No No Abnormalities Noted: No Erythema: Yes Moisture Erythema Location: Circumferential No Abnormalities Noted: No Temperature / Pain Temperature: No Abnormality Tenderness on Palpation: Yes Wound Preparation Ulcer Cleansing: Rinsed/Irrigated with Saline Topical Anesthetic Applied: Other: lidocaine 4%, Treatment Notes Wound #1 (Left, Lateral Malleolus) 1. Cleansed with: Clean wound with Normal Saline 2. Anesthetic Topical Lidocaine 4% cream to wound bed prior to debridement 3. Peri-wound Care: Skin Prep 4. Dressing Applied: Iodosorb Ointment 5. Secondary Dressing Applied Bordered Foam Dressing Dry Gauze Electronic Signature(s) Signed: 07/11/2016 5:07:14 PM By: Alric Quan Entered By: Alric Quan on 07/11/2016 13:40:34 [name] ([id number]) -------------------------------------------------------------------------------- Vitals Details Patient Name: [name], [name]. Date of Service: 07/11/2016 1:30 PM Medical Record Number: [id number] Patient Account Number: [id number] Date of Birth/Sex: [birth year] (82 y.o. Male) Treating RN: Ahmed Prima Primary Care Sheran Newstrom: [name] Other Clinician: Referring Margret Moat: [name] Treating Verbena Boeding/Extender: [name] in Treatment: 6 Vital Signs Time Taken: 13:38 Pulse (bpm): 53 Height (in): 69 Respiratory Rate (breaths/min): 18 Weight (lbs):  174.8 Blood Pressure (mmHg): 142/60 Body Mass Index (BMI): 25.8 Reference Range: 80 - 120 mg / dl Electronic Signature(s) Signed: 07/11/2016 5:07:14 PM By: Alric Quan Entered By: Alric Quan on 07/11/2016 13:38:48

## 2016-07-12 NOTE — Progress Notes (Signed)
[name], [name] ([id number]) Visit Report for 07/11/2016 Chief Complaint Document Details Patient Name: [name], [name]. Date of Service: 07/11/2016 1:30 PM Medical Record Number: [id number] Patient Account Number: [id number] Date of Birth/Sex: [birth year] (82 y.o. Male) Treating RN: Ahmed Prima Primary Care Provider: [name] Other Clinician: Referring Provider: [name] Treating Provider/Extender: [name] in Treatment: 6 Information Obtained from: Patient Chief Complaint Patient is at the clinic for treatment of an open pressure ulcer to the left ankle which she's had for about 5 weeks Electronic Signature(s) Signed: 07/11/2016 1:48:37 PM By: Christin Fudge MD, FACS Entered By: Christin Fudge on 07/11/2016 13:48:37 [name] ([id number]) -------------------------------------------------------------------------------- HPI Details Patient Name: [name], [name]. Date of Service: 07/11/2016 1:30 PM Medical Record Number: [id number] Patient Account Number: [id number] Date of Birth/Sex: Oct 01, 1934 (82 y.o. Male) Treating RN: Ahmed Prima Primary Care Provider: [name] Other Clinician: Referring Provider: [name] Treating Provider/Extender: [name] in Treatment: 6 History of Present Illness Location: left ankle laterally Quality: Patient reports experiencing a dull pain to affected area(s). Severity: Patient states wound are getting worse. Duration: Patient has had the wound for < 5 weeks prior to presenting for treatment Timing: Pain in wound is Intermittent (comes and goes Context: The wound appeared gradually over time Modifying Factors: Other treatment(s) tried include:put on Bactroban and doxycycline Associated Signs and Symptoms: Patient reports having difficulty standing for long periods. HPI Description: 82 year old gentleman who is known to have a history of dementia but is fairly functional and is able to  ambulate has been having a pressure ulcer to the left ankle due to laying in bed for longer than he should. Past history of atrial fibrillation, dementia, hyperlipidemia, hypertension. He's also had some orthopedic related surgeries in the past and was a former smoker and is given up in 1969. Recently seen by his PCP who put him on doxycycline and asked him to apply Bactroban and see [place] at the wound clinic. 06/05/2016 -- had a x-ray of the left ankle which showed no acute bony pathology. Electronic Signature(s) Signed: 07/11/2016 1:48:44 PM By: Christin Fudge MD, FACS Entered By: Christin Fudge on 07/11/2016 13:48:44 [name] ([id number]) -------------------------------------------------------------------------------- Physical Exam Details Patient Name: [name], [name]. Date of Service: 07/11/2016 1:30 PM Medical Record Number: [id number] Patient Account Number: [id number] Date of Birth/Sex: 1935/04/16 (82 y.o. Male) Treating RN: Ahmed Prima Primary Care Provider: [name] Other Clinician: Referring Provider: [name] Treating Provider/Extender: [name] in Treatment: 6 Constitutional . Pulse regular. Respirations normal and unlabored. Afebrile. . Eyes Nonicteric. Reactive to light. Ears, Nose, Mouth, and Throat Lips, teeth, and gums WNL.Marland Kitchen Moist mucosa without lesions. Neck supple and nontender. No palpable supraclavicular or cervical adenopathy. Normal sized without goiter. Respiratory WNL. No retractions.. Breath sounds WNL, No rubs, rales, rhonchi, or wheeze.. Cardiovascular Heart rhythm and rate regular, no murmur or gallop.. Pedal Pulses WNL. No clubbing, cyanosis or edema. Lymphatic No adneopathy. No adenopathy. No adenopathy. Musculoskeletal Adexa without tenderness or enlargement.. Digits and nails w/o clubbing, cyanosis, infection, petechiae, ischemia, or inflammatory conditions.. Integumentary (Hair, Skin) No suspicious lesions. No  crepitus or fluctuance. No peri-wound warmth or erythema. No masses.Marland Kitchen Psychiatric Judgement and insight Intact.. No evidence of depression, anxiety, or agitation.. Notes the wound has not significantly improved for the better and no debridement was required today. Electronic Signature(s) Signed: 07/11/2016 1:49:10 PM By: Christin Fudge MD, FACS Entered By: Christin Fudge on 07/11/2016 13:49:10 [name] ([id number]) --------------------------------------------------------------------------------  Physician Orders Details Patient Name: [name], [name]. Date of Service: 07/11/2016 1:30 PM Medical Record Number: [id number] Patient Account Number: [id number] Date of Birth/Sex: [birth year] (82 y.o. Male) Treating RN: Ahmed Prima Primary Care Provider: [name] Other Clinician: Referring Provider: [name] Treating Provider/Extender: [name] in Treatment: 6 Verbal / Phone Orders: Yes Clinician: Pinkerton, Debi Read Back and Verified: Yes Diagnosis Coding Wound Cleansing Wound #1 Left,Lateral Malleolus o Clean wound with Normal Saline. o May Shower, gently pat wound dry prior to applying new dressing. Anesthetic Wound #1 Left,Lateral Malleolus o Topical Lidocaine 4% cream applied to wound bed prior to debridement - for clinic use Skin Barriers/Peri-Wound Care Wound #1 Left,Lateral Malleolus o Skin Prep Primary Wound Dressing Wound #1 Left,Lateral Malleolus o Iodosorb Ointment Secondary Dressing Wound #1 Left,Lateral Malleolus o Dry Gauze o Boardered Foam Dressing Follow-up Appointments Wound #1 Left,Lateral Malleolus o Return Appointment in 1 week. Edema Control Wound #1 Left,Lateral Malleolus o Elevate legs to the level of the heart and pump ankles as often as possible Off-Loading Wound #1 Left,Lateral Malleolus o Turn and reposition every 2 hours o Other: - keep pressure off of affected area [name], [name].  ([id number]) Additional Orders / Instructions Wound #1 Left,Lateral Malleolus o Increase protein intake. Medications-please add to medication list. Wound #1 Left,Lateral Malleolus o Other: - Vitamin A, Vitamin C, Zinc, MVI Electronic Signature(s) Signed: 07/11/2016 3:56:49 PM By: Christin Fudge MD, FACS Signed: 07/11/2016 5:07:14 PM By: Alric Quan Entered By: Alric Quan on 07/11/2016 13:45:35 [name] ([id number]) -------------------------------------------------------------------------------- Problem List Details Patient Name: [name], [name]. Date of Service: 07/11/2016 1:30 PM Medical Record Number: [id number] Patient Account Number: [id number] Date of Birth/Sex: [birth year] (82 y.o. Male) Treating RN: Ahmed Prima Primary Care Provider: [name] Other Clinician: Referring Provider: [name] Treating Provider/Extender: [name] in Treatment: 6 Active Problems ICD-10 Encounter Code Description Active Date Diagnosis [phone] Pressure ulcer of left ankle, stage 2 05/29/2016 Yes F02.80 Dementia in other diseases classified elsewhere without 05/29/2016 Yes behavioral disturbance Inactive Problems Resolved Problems Electronic Signature(s) Signed: 07/11/2016 1:48:21 PM By: Christin Fudge MD, FACS Entered By: Christin Fudge on 07/11/2016 13:48:21 [name] ([id number]) -------------------------------------------------------------------------------- Progress Note Details Patient Name: [name], [name]. Date of Service: 07/11/2016 1:30 PM Medical Record Number: [id number] Patient Account Number: [id number] Date of Birth/Sex: [birth year] (82 y.o. Male) Treating RN: Ahmed Prima Primary Care Provider: [name] Other Clinician: Referring Provider: [name] Treating Provider/Extender: [name] in Treatment: 6 Subjective Chief Complaint Information obtained from Patient Patient is at the clinic for treatment  of an open pressure ulcer to the left ankle which she's had for about 5 weeks History of Present Illness (HPI) The following HPI elements were documented for the patient's wound: Location: left ankle laterally Quality: Patient reports experiencing a dull pain to affected area(s). Severity: Patient states wound are getting worse. Duration: Patient has had the wound for < 5 weeks prior to presenting for treatment Timing: Pain in wound is Intermittent (comes and goes Context: The wound appeared gradually over time Modifying Factors: Other treatment(s) tried include:put on Bactroban and doxycycline Associated Signs and Symptoms: Patient reports having difficulty standing for long periods. 82 year old gentleman who is known to have a history of dementia but is fairly functional and is able to ambulate has been having a pressure ulcer to the left ankle due to laying in bed for longer than he should. Past history of atrial fibrillation, dementia, hyperlipidemia,  hypertension. He's also had some orthopedic related surgeries in the past and was a former smoker and is given up in 1969. Recently seen by his PCP who put him on doxycycline and asked him to apply Bactroban and see [place] at the wound clinic. 06/05/2016 -- had a x-ray of the left ankle which showed no acute bony pathology. Objective Constitutional Pulse regular. Respirations normal and unlabored. Afebrile. Vitals Time Taken: 1:38 PM, Height: 69 in, Weight: 174.8 lbs, BMI: 25.8, Pulse: 53 bpm, Respiratory Rate: 18 breaths/min, Blood Pressure: 142/60 mmHg. [name], [name]. ([id number]) Eyes Nonicteric. Reactive to light. Ears, Nose, Mouth, and Throat Lips, teeth, and gums WNL.Marland Kitchen Moist mucosa without lesions. Neck supple and nontender. No palpable supraclavicular or cervical adenopathy. Normal sized without goiter. Respiratory WNL. No retractions.. Breath sounds WNL, No rubs, rales, rhonchi, or wheeze.. Cardiovascular Heart rhythm and rate  regular, no murmur or gallop.. Pedal Pulses WNL. No clubbing, cyanosis or edema. Lymphatic No adneopathy. No adenopathy. No adenopathy. Musculoskeletal Adexa without tenderness or enlargement.. Digits and nails w/o clubbing, cyanosis, infection, petechiae, ischemia, or inflammatory conditions.Marland Kitchen Psychiatric Judgement and insight Intact.. No evidence of depression, anxiety, or agitation.. General Notes: the wound has not significantly improved for the better and no debridement was required today. Integumentary (Hair, Skin) No suspicious lesions. No crepitus or fluctuance. No peri-wound warmth or erythema. No masses.. Wound #1 status is Open. Original cause of wound was Pressure Injury. The wound is located on the Left,Lateral Malleolus. The wound measures 0.4cm length x 0.3cm width x 0.2cm depth; 0.094cm^2 area and 0.019cm^3 volume. There is Fat Layer (Subcutaneous Tissue) Exposed exposed. There is no tunneling or undermining noted. There is a large amount of serous drainage noted. The wound margin is distinct with the outline attached to the wound base. There is no granulation within the wound bed. There is a large (67- 100%) amount of necrotic tissue within the wound bed including Eschar and Adherent Slough. The periwound skin appearance exhibited: Erythema. The surrounding wound skin color is noted with erythema which is circumferential. Periwound temperature was noted as No Abnormality. The periwound has tenderness on palpation. Assessment Active Problems ICD-10 [name], [name] ([id number]) L89.522 - Pressure ulcer of left ankle, stage 2 F02.80 - Dementia in other diseases classified elsewhere without behavioral disturbance Plan Wound Cleansing: Wound #1 Left,Lateral Malleolus: Clean wound with Normal Saline. May Shower, gently pat wound dry prior to applying new dressing. Anesthetic: Wound #1 Left,Lateral Malleolus: Topical Lidocaine 4% cream applied to wound bed prior to  debridement - for clinic use Skin Barriers/Peri-Wound Care: Wound #1 Left,Lateral Malleolus: Skin Prep Primary Wound Dressing: Wound #1 Left,Lateral Malleolus: Iodosorb Ointment Secondary Dressing: Wound #1 Left,Lateral Malleolus: Dry Gauze Boardered Foam Dressing Follow-up Appointments: Wound #1 Left,Lateral Malleolus: Return Appointment in 1 week. Edema Control: Wound #1 Left,Lateral Malleolus: Elevate legs to the level of the heart and pump ankles as often as possible Off-Loading: Wound #1 Left,Lateral Malleolus: Turn and reposition every 2 hours Other: - keep pressure off of affected area Additional Orders / Instructions: Wound #1 Left,Lateral Malleolus: Increase protein intake. Medications-please add to medication list.: Wound #1 Left,Lateral Malleolus: Other: - Vitamin A, Vitamin C, Zinc, MVI [name], Jared M. ([id number]) I have recommended: 1. Iodosorb ointment to be applied daily, and a protective bordered foam. This was not affordable so they're using Medihoney. 2. Off-Loading has been discussed in great detail 3. adequate protein, vitamin A, vitamin C and zinc 4. Regular visits to the wound center Electronic Signature(s) Signed: 07/11/2016 1:49:39  PM By: Christin Fudge MD, FACS Entered By: Christin Fudge on 07/11/2016 13:49:39 [name] ([id number]) -------------------------------------------------------------------------------- SuperBill Details Patient Name: [name], [name]. Date of Service: 07/11/2016 Medical Record Number: [id number] Patient Account Number: [id number] Date of Birth/Sex: 06-04-1934 (82 y.o. Male) Treating RN: Ahmed Prima Primary Care Provider: [name] Other Clinician: Referring Provider: [name] Treating Provider/Extender: Christin Fudge Service Line: Outpatient Weeks in Treatment: 6 Diagnosis Coding ICD-10 Codes Code Description [phone] Pressure ulcer of left ankle, stage 2 F02.80 Dementia in other diseases  classified elsewhere without behavioral disturbance Facility Procedures CPT4 Code: [id number] Description: 99213 - WOUND CARE VISIT-LEV 3 EST PT Modifier: Quantity: 1 Physician Procedures CPT4: Description Modifier Quantity Code [id number] 99213 - WC PHYS LEVEL 3 - EST PT 1 ICD-10 Description Diagnosis L89.522 Pressure ulcer of left ankle, stage 2 F02.80 Dementia in other diseases classified elsewhere without behavioral disturbance Electronic Signature(s) Signed: 07/11/2016 4:34:55 PM By: Christin Fudge MD, FACS Signed: 07/11/2016 5:07:14 PM By: Alric Quan Previous Signature: 07/11/2016 1:49:50 PM Version By: Christin Fudge MD, FACS Entered By: Alric Quan on 07/11/2016 46:95:07

## 2016-07-14 ENCOUNTER — Ambulatory Visit: Payer: PPO | Admitting: Physical Therapy

## 2016-07-16 ENCOUNTER — Ambulatory Visit: Payer: PPO | Admitting: Physical Therapy

## 2016-07-16 DIAGNOSIS — R2689 Other abnormalities of gait and mobility: Secondary | ICD-10-CM

## 2016-07-16 DIAGNOSIS — R296 Repeated falls: Secondary | ICD-10-CM

## 2016-07-16 DIAGNOSIS — R293 Abnormal posture: Secondary | ICD-10-CM

## 2016-07-16 DIAGNOSIS — M6281 Muscle weakness (generalized): Secondary | ICD-10-CM

## 2016-07-16 NOTE — Therapy (Signed)
[facility] [address] [place], [place], [address] Phone: [phone]   Fax:  [phone]  Physical Therapy Treatment  Patient Details  Name: [name] MRN: [id number] Date of Birth: Oct 13, 1934 Referring Provider: Jennings Books, MD  Encounter Date: 07/16/2016      PT End of Session - 07/16/16 1859    Visit Number 10   Number of Visits 16   Date for PT Re-Evaluation 08/06/16   Authorization - Visit Number 10   Authorization - Number of Visits 18   PT Start Time 3976   PT Stop Time 1341   PT Time Calculation (min) 54 min   Equipment Utilized During Treatment Gait belt   Activity Tolerance Patient tolerated treatment well   Behavior During Therapy [facility] for tasks assessed/performed      Past Medical History:  Diagnosis Date  . Absolute anemia 09/11/2014  . Anxiety   . Atrial fibrillation ([place]) 01/23/2014  . BP (high blood pressure) 11/07/2013  . Carotid artery disease ([place]) 09/11/2014  . Complete rotator cuff rupture of left shoulder 12/30/2013  . Dementia 09/11/2014  . Depression   . Heart murmur   . HLD (hyperlipidemia) 01/23/2014  . Hypertension   . Obstructive apnea 11/07/2013  . Personal history of other diseases of the circulatory system 01/23/2014  . Sleep apnea   . 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 Back Surgery- Tumor Removal    . SHOULDER SURGERY Right 2007    There were no vitals filed for this visit.      Subjective Assessment - 07/16/16 1251    Subjective Patient presents to physical therapy with [facility] and does not walk at home too much with cane. Patient has some occasional back pain. No LOB or falls.    Patient is accompained by: Family member   Pertinent History Hx of left knee pain, low back pain, shoulder pain. Hx of falls, dementia, and teretoma of the spine   Limitations Sitting;Lifting;Standing;Walking;House hold activities   Diagnostic tests BERG   Patient Stated Goals get stronger, pick up legs better when walking, getting up easier and faster   Currently in Pain? No/denies      TherEx sit to stands varying heights and level of softness chairs and from couch. 5x each level. 8x from couch. Cues to scoot forward, lean forward and foot placement Nustep lvl 5-3 7 minutes  Neuro Re-ed Side step over 3" step with decreased UE assistance 3# standing arm raises, bicep curls, chest flys 10x. Good balance  Side stepping in // bars 4x with cues for upright posture Walking lunges in // bars for multiple trials with cues for stretching.  LSVT big step forward and back clap 10x each time LSVT big step forward and back contralateral arm raise 10x  Gait: Big steps forward in // bars with cues for taking larger steps Step over 2 reciprical 3" steps with single UE assistance Ambulating with SPC 40 ft x 2 with cues for upright posture and larger step length.    Pt. Response to medical necessity: patient has demonstrated improved strength and balance but continues to have deficits affecting activities of daily living, ambulatory abilities and posture. Patient will benefit from continued skilled physical therapy to improve safe ambulation and quality of life.         PT Long Term Goals - 07/09/16 7341  PT LONG TERM GOAL #1   Title Patient will increase score on Berg Balance to 49/56 for decreased fall risk.    Baseline 3/7: 50/56 ; 2/8: 44/56   Time 4   Period Weeks   Status Achieved     PT LONG TERM GOAL #2   Title Patient will have gross 4/5 bilateral LE strength to increase ability to perform functional activities.    Baseline Gross LLE 4-/5 knee flexion 3+/5, RLE 4/5   Time 4   Period Weeks   Status On-going     PT LONG TERM GOAL #3   Title Patient will ambulate 5 minutes with a cane without LOB to increase community mobility.     Baseline Patient able to walk 5 minutes with cane but with poor  posture.    Time 4   Period Weeks   Status Achieved     PT LONG TERM GOAL #4   Title Patient will perform 10 sit to stands without use of hands or LOB to demonstrate improved capacity for functional activities.    Baseline Patient can perform 10 sit to stands with no hands with decreased eccentric control with fatigue.    Time 4   Period Weeks   Status Achieved     PT LONG TERM GOAL #5   Title Patient will have upright posture for 10 minutes to allow for better body mechanics and decreased back pain.    Baseline Patient requires cueing ~ every 2 minutes   Time 4   Period Weeks   Status On-going     Additional Long Term Goals   Additional Long Term Goals Yes     PT LONG TERM GOAL #6   Title Patient will not have SI/back pain while ambulating to allow for increased community mobility.    Baseline Patient has occasional 5/10 back pain while ambulating   Time 4   Period Weeks   Status On-going     PT LONG TERM GOAL #7   Title Patient will perform 10 sit to stands from surface lower than PT room chairs to increase independence in home setting due to low height of furniture at home.    Baseline Patient can perform 10 sit to stands with fatigue from chair height, able to perform 1 with hands on knees from lowered surface.    Time 4   Period Weeks   Status New     PT LONG TERM GOAL #8   Title Patient will score a 52/56 on Berg to decrease fall risk to minimal and increase single limb abilities to improve carryover to household activities such as stepping into shower.    Baseline 3/7: 50/56, extreme difficulty stepping into shower   Time 4   Period Weeks   Status New            Plan - 07/16/16 1906    Clinical Impression Statement Patient was educated on proper sit to stand technique from a variety of surfaces to improve functional home mobility. Getting up from low or squishy surfaces such as the couch at home is difficult for patient. Sit to stand transfer was challenged  with low soft surfaces and patient progressed with efficiency and form with repetition. Short stride length continues to negatively affect patient's gait mechanics and pt. was encouraged to take larger steps with carryover noted after stepping interventions. Standing balance was good with UE weight tasks such as bicep curls and straight arm raises. Frequent cueing is still required for  postural control for patient in all activities. Patient will continue to benefit from skilled physical therapy to decrease fall risk, improve gross strength and ROM, control pain and allow patient safe ambulation in the community for improved quality of life.    Rehab Potential Fair   Clinical Impairments Affecting Rehab Potential hx of falls, dementia, teratoma, mitral valve polapse, cancer, a fib, L knee injury, L shoulder, back pain   PT Frequency 2x / week   PT Duration 4 weeks   PT Treatment/Interventions ADLs/Self Care Home Management;Aquatic Therapy;Cryotherapy;Electrical Stimulation;Biofeedback;Moist Heat;DME Instruction;Gait training;Stair training;Functional mobility training;Therapeutic activities;Therapeutic exercise;Balance training;Neuromuscular re-education;Patient/family education;Orthotic Fit/Training;Manual techniques;Passive range of motion;Dry needling;Energy conservation   PT Next Visit Plan cane outside, side stepping over 6", shower step practice,    PT Home Exercise Plan see sheet   Consulted and Agree with Plan of Care Patient;Family member/caregiver   Family Member Consulted wife      Patient will benefit from skilled therapeutic intervention in order to improve the following deficits and impairments:  Abnormal gait, Decreased activity tolerance, Decreased balance, Decreased mobility, Decreased knowledge of use of DME, Decreased endurance, Decreased range of motion, Decreased safety awareness, Decreased skin integrity, Decreased knowledge of precautions, Decreased strength, Hypomobility,  Difficulty walking, Impaired flexibility, Postural dysfunction, Improper body mechanics, Decreased cognition  Visit Diagnosis: Muscle weakness (generalized)  Other abnormalities of gait and mobility  Repeated falls  Abnormal posture     Problem List Patient Active Problem List   Diagnosis Date Noted  . Fall 02/18/2016  . Scalp laceration 02/18/2016  . Splenic laceration 02/18/2016  . Acute blood loss anemia 02/18/2016  . Multiple rib fractures 02/16/2016  . Mild dementia 04/10/2015  . Aggrieved 03/02/2015  . Absolute anemia 09/11/2014  . Carotid artery disease ([place]) 09/11/2014  . Dementia 09/11/2014  . History of repair of rotator cuff 03/02/2014  . Atrial fibrillation with RVR ([place]) 01/23/2014  . Personal history of other diseases of the circulatory system 01/23/2014  . HLD (hyperlipidemia) 01/23/2014  . Complete rotator cuff rupture of left shoulder 12/30/2013  . BP (high blood pressure) 11/07/2013  . Amnesia 11/07/2013  . Obstructive apnea 11/07/2013   Pura Spice, PT, DPT # [id number] Janna Arch, SPT 07/17/2016, 12:22 PM  [facility] [address] [place], [place], [address] Phone: [phone]   Fax:  [phone]  Name: MARGUIS MATHIESON MRN: [id number] Date of Birth: 01/05/1935

## 2016-07-17 ENCOUNTER — Encounter: Payer: PPO | Admitting: Surgery

## 2016-07-17 DIAGNOSIS — L89522 Pressure ulcer of left ankle, stage 2: Secondary | ICD-10-CM | POA: Diagnosis not present

## 2016-07-17 DIAGNOSIS — L89523 Pressure ulcer of left ankle, stage 3: Secondary | ICD-10-CM | POA: Diagnosis not present

## 2016-07-18 NOTE — Progress Notes (Signed)
AMAHD, MORINO ([id number]) Visit Report for 07/17/2016 Arrival Information Details Patient Name: [name], [name]. Date of Service: 07/17/2016 2:30 PM Medical Record Number: [id number] Patient Account Number: [id number] Date of Birth/Sex: 1934-10-22 (82 y.o. Male) Treating RN: [name] Primary Care Huyen Perazzo: [name] Other Clinician: Referring Raekwon Winkowski: [name] Treating Delayza Lungren/Extender: [name] in Treatment: 7 Visit Information History Since Last Visit Added or deleted any medications: No Patient Arrived: [name] Any new allergies or adverse reactions: No Arrival Time: 14:28 Had a fall or experienced change in No Accompanied By: wife activities of daily living that may affect Transfer Assistance: None risk of falls: Patient Identification Verified: Yes Signs or symptoms of abuse/neglect since last No Secondary Verification Process Completed: Yes visito Patient Requires Transmission-Based No Hospitalized since last visit: No Precautions: Has Dressing in Place as Prescribed: Yes Patient Has Alerts: Yes Pain Present Now: No Patient Alerts: ASA Electronic Signature(s) Signed: 07/17/2016 5:26:13 PM By: [name], RN, [name], [name], [name] Entered By: [name], RN, [name], Kim on 07/17/2016 14:29:03 [name] ([id number]) -------------------------------------------------------------------------------- Clinic Level of Care Assessment Details Patient Name: [name], [name]. Date of Service: 07/17/2016 2:30 PM Medical Record Number: [id number] Patient Account Number: [id number] Date of Birth/Sex: [birth year] (82 y.o. Male) Treating RN: [name] Primary Care Haddon Fyfe: [name] Other Clinician: Referring Lem Peary: [name] Treating Addalee Kavanagh/Extender: [name] in Treatment: 7 Clinic Level of Care Assessment Items TOOL 4 Quantity Score []  - Use when only an EandM is performed on FOLLOW-UP visit 0 ASSESSMENTS - Nursing Assessment /  Reassessment []  - Reassessment of Co-morbidities (includes updates in patient status) 0 X - Reassessment of Adherence to Treatment Plan 1 5 ASSESSMENTS - Wound and Skin Assessment / Reassessment X - Simple Wound Assessment / Reassessment - one wound 1 5 []  - Complex Wound Assessment / Reassessment - multiple wounds 0 []  - Dermatologic / Skin Assessment (not related to wound area) 0 ASSESSMENTS - Focused Assessment []  - Circumferential Edema Measurements - multi extremities 0 []  - Nutritional Assessment / Counseling / Intervention 0 []  - Lower Extremity Assessment (monofilament, tuning fork, pulses) 0 []  - Peripheral Arterial Disease Assessment (using hand held doppler) 0 ASSESSMENTS - Ostomy and/or Continence Assessment and Care []  - Incontinence Assessment and Management 0 []  - Ostomy Care Assessment and Management (repouching, etc.) 0 PROCESS - Coordination of Care X - Simple Patient / Family Education for ongoing care 1 15 []  - Complex (extensive) Patient / Family Education for ongoing care 0 []  - Staff obtains Programmer, systems, Records, Test Results / Process Orders 0 []  - Staff telephones HHA, Nursing Homes / Clarify orders / etc 0 []  - Routine Transfer to another Facility (non-emergent condition) 0 [name], [name]. ([id number]) []  - Routine Hospital Admission (non-emergent condition) 0 []  - New Admissions / Biomedical engineer / Ordering NPWT, Apligraf, etc. 0 []  - Emergency Hospital Admission (emergent condition) 0 X - Simple Discharge Coordination 1 10 []  - Complex (extensive) Discharge Coordination 0 PROCESS - Special Needs []  - Pediatric / Minor Patient Management 0 []  - Isolation Patient Management 0 []  - Hearing / Language / Visual special needs 0 []  - Assessment of Community assistance (transportation, D/C planning, etc.) 0 []  - Additional assistance / Altered mentation 0 []  - Support Surface(s) Assessment (bed, cushion, seat, etc.) 0 INTERVENTIONS - Wound Cleansing /  Measurement X - Simple Wound Cleansing - one wound 1 5 []  - Complex Wound Cleansing - multiple wounds 0 X - Wound  Imaging (photographs - any number of wounds) 1 5 []  - Wound Tracing (instead of photographs) 0 X - Simple Wound Measurement - one wound 1 5 []  - Complex Wound Measurement - multiple wounds 0 INTERVENTIONS - Wound Dressings []  - Small Wound Dressing one or multiple wounds 0 X - Medium Wound Dressing one or multiple wounds 1 15 []  - Large Wound Dressing one or multiple wounds 0 []  - Application of Medications - topical 0 []  - Application of Medications - injection 0 INTERVENTIONS - Miscellaneous []  - External ear exam 0 [name], [name]. ([id number]) []  - Specimen Collection (cultures, biopsies, blood, body fluids, etc.) 0 []  - Specimen(s) / Culture(s) sent or taken to Lab for analysis 0 []  - Patient Transfer (multiple staff / [name] / Similar devices) 0 []  - Simple Staple / Suture removal (25 or less) 0 []  - Complex Staple / Suture removal (26 or more) 0 []  - Hypo / Hyperglycemic Management (close monitor of Blood Glucose) 0 []  - Ankle / Brachial Index (ABI) - do not check if billed separately 0 X - Vital Signs 1 5 Has the patient been seen at the hospital within the last three years: Yes Total Score: 70 Level Of Care: New/Established - Level 2 Electronic Signature(s) Signed: 07/17/2016 5:26:13 PM By: [name], RN, [name], [name], [name] Entered By: [name], RN, [name], Kim on 07/17/2016 14:49:16 [name] ([id number]) -------------------------------------------------------------------------------- Encounter Discharge Information Details Patient Name: [name], [name]. Date of Service: 07/17/2016 2:30 PM Medical Record Number: [id number] Patient Account Number: [id number] Date of Birth/Sex: [birth year] (82 y.o. Male) Treating RN: [name] Primary Care Mekia Dipinto: [name] Other Clinician: Referring Emma-Lee Oddo: [name] Treating Gilad Dugger/Extender: [name] in Treatment: 7 Encounter Discharge Information Items Discharge Pain Level: 0 Discharge Condition: Stable Ambulatory Status: Cane Discharge Destination: Home Transportation: Private Auto Accompanied By: self Schedule Follow-up Appointment: Yes Medication Reconciliation completed Yes and provided to Patient/Care Reinhart Saulters: Provided on Clinical Summary of Care: 07/17/2016 Form Type Recipient Paper Patient ET Electronic Signature(s) Signed: 07/17/2016 5:26:13 PM By: [name] RN, [name], [name], [name] Previous Signature: 07/17/2016 2:49:59 PM Version By: Ruthine Dose Entered By: [name] RN, [name], Kim on 07/17/2016 14:56:56 [name] ([id number]) -------------------------------------------------------------------------------- Lower Extremity Assessment Details Patient Name: [name], [name]. Date of Service: 07/17/2016 2:30 PM Medical Record Number: [id number] Patient Account Number: [id number] Date of Birth/Sex: 08-02-1934 (82 y.o. Male) Treating RN: [name] Primary Care Kineta Fudala: [name] Other Clinician: Referring Shadrach Bartunek: [name] Treating Breeanne Oblinger/Extender: [name] in Treatment: 7 Vascular Assessment Claudication: Claudication Assessment [Left:None] Pulses: Dorsalis Pedis Palpable: [Left:Yes] Posterior Tibial Palpable: [Left:Yes] Extremity colors, hair growth, and conditions: Extremity Color: [Left:Normal] Hair Growth on Extremity: [Left:Yes] Temperature of Extremity: [Left:Warm] Capillary Refill: [Left:< 3 seconds] Dependent Rubor: [Left:No] Blanched when Elevated: [Left:No] Lipodermatosclerosis: [Left:No] Toe Nail Assessment Left: Right: Thick: No Discolored: No Deformed: No Improper Length and Hygiene: No Electronic Signature(s) Signed: 07/17/2016 5:26:13 PM By: [name], RN, [name], [name], [name] Entered By: [name], RN, [name], Kim on 07/17/2016 14:35:50 [name]  ([id number]) -------------------------------------------------------------------------------- Multi Wound Chart Details Patient Name: [name], [name]. Date of Service: 07/17/2016 2:30 PM Medical Record Number: [id number] Patient Account Number: [id number] Date of Birth/Sex: 1934-05-29 (82 y.o. Male) Treating RN: [name] Primary Care Lenette Rau: [name] Other Clinician: Referring Devota Viruet: [name] Treating Meika Earll/Extender: [name] in Treatment: 7 Vital Signs Height(in): 69 Pulse(bpm): 56 Weight(lbs): 174.8 Blood Pressure 154/57 (mmHg): Body Mass Index(BMI):  26 Temperature(F): Respiratory Rate 16 (breaths/min): Photos: [N/A:N/A] Wound Location: Left Malleolus - Lateral N/A N/A Wounding Event: Pressure Injury N/A N/A Primary Etiology: Pressure Ulcer N/A N/A Comorbid History: Cataracts, Anemia, Sleep N/A N/A Apnea, Arrhythmia, Hypertension, Dementia Date Acquired: 05/01/2016 N/A N/A Weeks of Treatment: 7 N/A N/A Wound Status: Open N/A N/A Measurements L x W x D 0.4x0.3x0.1 N/A N/A (cm) Area (cm) : 0.094 N/A N/A Volume (cm) : 0.009 N/A N/A % Reduction in Area: 52.00% N/A N/A % Reduction in Volume: 55.00% N/A N/A Classification: Category/Stage II N/A N/A Exudate Amount: Large N/A N/A Exudate Type: Serous N/A N/A Exudate Color: amber N/A N/A Wound Margin: Distinct, outline attached N/A N/A Granulation Amount: None Present (0%) N/A N/A Necrotic Amount: Large (67-100%) N/A N/A Necrotic Tissue: Eschar, Adherent Slough N/A N/A Exposed Structures: N/A N/A [name], [name]. ([id number]) Fat Layer (Subcutaneous Tissue) Exposed: Yes Epithelialization: None N/A N/A Periwound Skin Texture: No Abnormalities Noted N/A N/A Periwound Skin No Abnormalities Noted N/A N/A Moisture: Periwound Skin Color: Erythema: Yes N/A N/A Erythema Location: Circumferential N/A N/A Temperature: No Abnormality N/A N/A Tenderness on Yes N/A N/A Palpation: Wound  Preparation: Ulcer Cleansing: N/A N/A Rinsed/Irrigated with Saline Topical Anesthetic Applied: Other: lidocaine 4% Treatment Notes Electronic Signature(s) Signed: 07/17/2016 2:48:21 PM By: Christin Fudge MD, FACS Entered By: Christin Fudge on 07/17/2016 14:48:21 [name] ([id number]) -------------------------------------------------------------------------------- [place] Details Patient Name: [name], [name]. Date of Service: 07/17/2016 2:30 PM Medical Record Number: [id number] Patient Account Number: [id number] Date of Birth/Sex: 1934/10/08 (82 y.o. Male) Treating RN: [name] Primary Care Zafar Debrosse: [name] Other Clinician: Referring Darrol Brandenburg: [name] Treating Wolfgang Finigan/Extender: [name] in Treatment: 7 Active Inactive ` Abuse / Safety / Falls / Self Care Management Nursing Diagnoses: Potential for falls Goals: Patient will remain injury free Date Initiated: 05/29/2016 Target Resolution Date: 08/02/2016 Goal Status: Active Interventions: Assess fall risk on admission and as needed Assess impairment of mobility on admission and as needed per policy Notes: ` Nutrition Nursing Diagnoses: Imbalanced nutrition Goals: Patient/caregiver agrees to and verbalizes understanding of need to use nutritional supplements and/or vitamins as prescribed Date Initiated: 05/29/2016 Target Resolution Date: 08/02/2016 Goal Status: Active Interventions: Assess patient nutrition upon admission and as needed per policy Notes: ` Orientation to the Wound Care Program Nursing Diagnoses: BERNARD, SLAYDEN ([id number]) Knowledge deficit related to the wound healing center program Goals: Patient/caregiver will verbalize understanding of the [address] Program Date Initiated: 05/29/2016 Target Resolution Date: 06/14/2016 Goal Status: Active Interventions: Provide education on orientation to the wound center Notes: ` Pain,  Acute or Chronic Nursing Diagnoses: Pain, acute or chronic: actual or potential Potential alteration in comfort, pain Goals: Patient will verbalize adequate pain control and receive pain control interventions during procedures as needed Date Initiated: 05/29/2016 Target Resolution Date: 08/02/2016 Goal Status: Active Interventions: Complete pain assessment as per visit requirements Notes: ` Wound/Skin Impairment Nursing Diagnoses: Impaired tissue integrity Knowledge deficit related to ulceration/compromised skin integrity Goals: Ulcer/skin breakdown will have a volume reduction of 80% by week 12 Date Initiated: 05/29/2016 Target Resolution Date: 08/02/2016 Goal Status: Active Interventions: Assess patient/caregiver ability to perform ulcer/skin care regimen upon admission and as needed Assess ulceration(s) every visit Notes: [name], [name] ([id number]) Electronic Signature(s) Signed: 07/17/2016 5:26:13 PM By: [name], RN, [name], [name], [name] Entered By: [name], RN, [name], Kim on 07/17/2016 14:35:55 [name] ([id number]) -------------------------------------------------------------------------------- Pain Assessment Details Patient Name: [name], [name]. Date of Service: 07/17/2016 2:30 PM  Medical Record Number: [id number] Patient Account Number: [id number] Date of Birth/Sex: [birth year] (82 y.o. Male) Treating RN: [name] Primary Care Yusuke Beza: [name] Other Clinician: Referring Makynli Stills: [name] Treating Glessie Eustice/Extender: [name] in Treatment: 7 Active Problems Location of Pain Severity and Description of Pain Patient Has Paino No Site Locations With Dressing Change: No Pain Management and Medication Current Pain Management: Electronic Signature(s) Signed: 07/17/2016 5:26:13 PM By: [name], RN, [name], [name], [name] Entered By: [name], RN, [name], Kim on 07/17/2016 14:29:11 [name]  ([id number]) -------------------------------------------------------------------------------- Patient/Caregiver Education Details Patient Name: [name], AMADON. Date of Service: 07/17/2016 2:30 PM Medical Record Number: [id number] Patient Account Number: [id number] Date of Birth/Gender: December 19, 1934 (82 y.o. Male) Treating RN: [name] Primary Care Physician: [name] Other Clinician: Referring Physician: [name] Treating Physician/Extender: [name] in Treatment: 7 Education Assessment Education Provided To: Patient Education Topics Provided Wound/Skin Impairment: Handouts: Caring for Your Ulcer Methods: Demonstration Responses: State content correctly Electronic Signature(s) Signed: 07/17/2016 5:26:13 PM By: [name], RN, [name], [name], [name] Entered By: [name], RN, [name], Kim on 07/17/2016 14:57:09 [name] ([id number]) -------------------------------------------------------------------------------- Wound Assessment Details Patient Name: LEGEND, TUMMINELLO. Date of Service: 07/17/2016 2:30 PM Medical Record Number: [id number] Patient Account Number: [id number] Date of Birth/Sex: May 30, 1934 (82 y.o. Male) Treating RN: [name] Primary Care Consuella Scurlock: [name] Other Clinician: Referring Nakya Weyand: [name] Treating Yancy Hascall/Extender: [name] in Treatment: 7 Wound Status Wound Number: 1 Primary Pressure Ulcer Etiology: Wound Location: Left Malleolus - Lateral Wound Open Wounding Event: Pressure Injury Status: Date Acquired: 05/01/2016 Comorbid Cataracts, Anemia, Sleep Apnea, Weeks Of Treatment: 7 History: Arrhythmia, Hypertension, Dementia Clustered Wound: No Photos Wound Measurements Length: (cm) 0.4 Width: (cm) 0.3 Depth: (cm) 0.1 Area: (cm) 0.094 Volume: (cm) 0.009 % Reduction in Area: 52% % Reduction in Volume: 55% Epithelialization: None Tunneling: No Undermining: No Wound Description Classification:  Category/Stage II Wound Margin: Distinct, outline attached Exudate Amount: Large Exudate Type: Serous Exudate Color: amber Foul Odor After Cleansing: No Slough/Fibrino No Wound Bed Granulation Amount: None Present (0%) Exposed Structure Necrotic Amount: Large (67-100%) Fat Layer (Subcutaneous Tissue) Exposed: Yes Necrotic Quality: Eschar, Adherent Slough Periwound Skin Texture Texture Color No Abnormalities Noted: No No Abnormalities Noted: No Erythema: Yes Moisture JAHIEM, FRANZONI ([id number]) No Abnormalities Noted: No Erythema Location: Circumferential Temperature / Pain Temperature: No Abnormality Tenderness on Palpation: Yes Wound Preparation Ulcer Cleansing: Rinsed/Irrigated with Saline Topical Anesthetic Applied: Other: lidocaine 4%, Treatment Notes Wound #1 (Left, Lateral Malleolus) 1. Cleansed with: Clean wound with Normal Saline 2. Anesthetic Topical Lidocaine 4% cream to wound bed prior to debridement 4. Dressing Applied: Iodosorb Ointment 5. Secondary Dressing Applied Bordered Foam Dressing Dry Gauze Electronic Signature(s) Signed: 07/17/2016 5:26:13 PM By: [name], RN, [name], [name], [name] Entered By: [name], RN, [name], Kim on 07/17/2016 14:33:59 [name] ([id number]) -------------------------------------------------------------------------------- [place] Details Patient Name: SWADE, SHONKA. Date of Service: 07/17/2016 2:30 PM Medical Record Number: [id number] Patient Account Number: [id number] Date of Birth/Sex: 12-09-1934 (82 y.o. Male) Treating RN: [name] Primary Care Tamrah Victorino: [name] Other Clinician: Referring Liz Pinho: [name] Treating Allisyn Kunz/Extender: [name] in Treatment: 7 Vital Signs Time Taken: 14:29 Pulse (bpm): 56 Height (in): 69 Respiratory Rate (breaths/min): 16 Weight (lbs): 174.8 Blood Pressure (mmHg): 154/57 Body Mass Index (BMI): 25.8 Reference Range: 80 - 120 mg / dl Electronic  Signature(s) Signed: 07/17/2016 5:26:13 PM By: [name], RN, [name], [name], [name] Entered By:  [name], RN, [name], Kim on 07/17/2016 41:74:08

## 2016-07-18 NOTE — Progress Notes (Signed)
[name] ([id number]) Visit Report for 07/17/2016 Chief Complaint Document Details Patient Name: [name], [name]. Date of Service: 07/17/2016 2:30 PM Medical Record Number: [id number] Patient Account Number: [id number] Date of Birth/Sex: 06-30-1934 (82 y.o. Male) Treating RN: [name] Primary Care Provider: Lynett Ho Other Clinician: Referring Provider: Lynett Ho Treating Provider/Extender: [name] in Treatment: 7 Information Obtained from: Patient Chief Complaint Patient is at the clinic for treatment of an open pressure ulcer to the left ankle which she's had for about 5 weeks Electronic Signature(s) Signed: 07/17/2016 2:48:28 PM By: Jared Fudge MD, [name] Entered By: [name] on 07/17/2016 14:48:28 [name] ([id number]) -------------------------------------------------------------------------------- HPI Details Patient Name: [name], [name]. Date of Service: 07/17/2016 2:30 PM Medical Record Number: [id number] Patient Account Number: [id number] Date of Birth/Sex: [birth year] (82 y.o. Male) Treating RN: [name] Primary Care Provider: Lynett Ho Other Clinician: Referring Provider: Lynett Ho Treating Provider/Extender: [name] in Treatment: 7 History of Present Illness Location: left ankle laterally Quality: Patient reports experiencing a dull pain to affected area(s). Severity: Patient states wound are getting worse. Duration: Patient has had the wound for < 5 weeks prior to presenting for treatment Timing: Pain in wound is Intermittent (comes and goes Context: The wound appeared gradually over time Modifying Factors: Other treatment(s) tried include:put on Bactroban and doxycycline Associated Signs and Symptoms: Patient reports having difficulty standing for long periods. HPI Description: 82 year old gentleman who is known to have a history of dementia but is fairly functional and is able to ambulate has  been having a pressure ulcer to the left ankle due to laying in bed for longer than he should. Past history of atrial fibrillation, dementia, hyperlipidemia, hypertension. He's also had some orthopedic related surgeries in the past and was a former smoker and is given up in 1969. Recently seen by his PCP who put him on doxycycline and asked him to apply Bactroban and see [place] at the wound clinic. 06/05/2016 -- had a x-ray of the left ankle which showed no acute bony pathology. Electronic Signature(s) Signed: 07/17/2016 2:48:33 PM By: Jared Fudge MD, [name] Entered By: [name] on 07/17/2016 14:48:32 [name] ([id number]) -------------------------------------------------------------------------------- Physical Exam Details Patient Name: [name], [name]. Date of Service: 07/17/2016 2:30 PM Medical Record Number: [id number] Patient Account Number: [id number] Date of Birth/Sex: 11-23-1934 (82 y.o. Male) Treating RN: [name] Primary Care Provider: Lynett Ho Other Clinician: Referring Provider: Lynett Ho Treating Provider/Extender: [name] in Treatment: 7 Constitutional . Pulse regular. Respirations normal and unlabored. Afebrile. . Eyes Nonicteric. Reactive to light. Ears, Nose, Mouth, and Throat Lips, teeth, and gums WNL.Marland Kitchen Moist mucosa without lesions. Neck supple and nontender. No palpable supraclavicular or cervical adenopathy. Normal sized without goiter. Respiratory WNL. No retractions.. Breath sounds WNL, No rubs, rales, rhonchi, or wheeze.. Cardiovascular Heart rhythm and rate regular, no murmur or gallop.. Pedal Pulses WNL. No clubbing, cyanosis or edema. Chest Breasts symmetical and no nipple discharge.. Breast tissue WNL, no masses, lumps, or tenderness.. Lymphatic No adneopathy. No adenopathy. No adenopathy. Musculoskeletal Adexa without tenderness or enlargement.. Digits and nails w/o clubbing, cyanosis, infection, petechiae, ischemia,  or inflammatory conditions.. Integumentary (Hair, Skin) No suspicious lesions. No crepitus or fluctuance. No peri-wound warmth or erythema. No masses.Marland Kitchen Psychiatric Judgement and insight Intact.. No evidence of depression, anxiety, or agitation.. Notes the wound is significantly looking better and there is no sharp debridement required today Electronic Signature(s) Signed: 07/17/2016 2:48:52 PM By: [name],  Jared Pee MD, [name] Entered By: [name] on 07/17/2016 14:48:52 [name] ([id number]) -------------------------------------------------------------------------------- Physician Orders Details Patient Name: [name], [name]. Date of Service: 07/17/2016 2:30 PM Medical Record Number: [id number] Patient Account Number: [id number] Date of Birth/Sex: Apr 07, 1935 (82 y.o. Male) Treating RN: [name] Primary Care Provider: Lynett Ho Other Clinician: Referring Provider: Lynett Ho Treating Provider/Extender: [name] in Treatment: 7 Verbal / Phone Orders: No Diagnosis Coding Wound Cleansing Wound #1 Left,Lateral Malleolus o Clean wound with Normal Saline. o May Shower, gently pat wound dry prior to applying new dressing. Anesthetic Wound #1 Left,Lateral Malleolus o Topical Lidocaine 4% cream applied to wound bed prior to debridement - for clinic use Skin Barriers/Peri-Wound Care Wound #1 Left,Lateral Malleolus o Skin Prep Primary Wound Dressing Wound #1 Left,Lateral Malleolus o Iodosorb Ointment Secondary Dressing Wound #1 Left,Lateral Malleolus o Dry Gauze o Boardered Foam Dressing Follow-up Appointments Wound #1 Left,Lateral Malleolus o Return Appointment in 1 week. Edema Control Wound #1 Left,Lateral Malleolus o Elevate legs to the level of the heart and pump ankles as often as possible Off-Loading Wound #1 Left,Lateral Malleolus o Turn and reposition every 2 hours o Other: - keep pressure off of affected area [name]. ([id number]) Additional Orders / Instructions Wound #1 Left,Lateral Malleolus o Increase protein intake. Medications-please add to medication list. Wound #1 Left,Lateral Malleolus o Other: - Vitamin A, Vitamin C, Zinc, MVI Electronic Signature(s) Signed: 07/17/2016 4:33:40 PM By: Jared Fudge MD, [name] Signed: 07/17/2016 5:26:13 PM By: [name], RN, [name], [name], [name] Entered By: [name], RN, [name], Kim on 07/17/2016 14:43:46 [name] ([id number]) -------------------------------------------------------------------------------- Problem List Details Patient Name: [name], [name]. Date of Service: 07/17/2016 2:30 PM Medical Record Number: [id number] Patient Account Number: [id number] Date of Birth/Sex: 1934-11-24 (82 y.o. Male) Treating RN: [name] Primary Care Provider: Lynett Ho Other Clinician: Referring Provider: Lynett Ho Treating Provider/Extender: [name] in Treatment: 7 Active Problems ICD-10 Encounter Code Description Active Date Diagnosis [phone] Pressure ulcer of left ankle, stage 2 05/29/2016 Yes F02.80 Dementia in other diseases classified elsewhere without 05/29/2016 Yes behavioral disturbance Inactive Problems Resolved Problems Electronic Signature(s) Signed: 07/17/2016 2:48:14 PM By: Jared Fudge MD, [name] Entered By: [name] on 07/17/2016 14:48:14 [name] ([id number]) -------------------------------------------------------------------------------- Progress Note Details Patient Name: [name], [name]. Date of Service: 07/17/2016 2:30 PM Medical Record Number: [id number] Patient Account Number: [id number] Date of Birth/Sex: [birth year] (82 y.o. Male) Treating RN: [name] Primary Care Provider: Lynett Ho Other Clinician: Referring Provider: Lynett Ho Treating Provider/Extender: [name] in Treatment: 7 Subjective Chief Complaint Information obtained from Patient Patient is at the  clinic for treatment of an open pressure ulcer to the left ankle which she's had for about 5 weeks History of Present Illness (HPI) The following HPI elements were documented for the patient's wound: Location: left ankle laterally Quality: Patient reports experiencing a dull pain to affected area(s). Severity: Patient states wound are getting worse. Duration: Patient has had the wound for < 5 weeks prior to presenting for treatment Timing: Pain in wound is Intermittent (comes and goes Context: The wound appeared gradually over time Modifying Factors: Other treatment(s) tried include:put on Bactroban and doxycycline Associated Signs and Symptoms: Patient reports having difficulty standing for long periods. 82 year old gentleman who is known to have a history of dementia but is fairly functional and is able to ambulate has been having a pressure ulcer to the left ankle due to laying in  bed for longer than he should. Past history of atrial fibrillation, dementia, hyperlipidemia, hypertension. He's also had some orthopedic related surgeries in the past and was a former smoker and is given up in 1969. Recently seen by his PCP who put him on doxycycline and asked him to apply Bactroban and see [place] at the wound clinic. 06/05/2016 -- had a x-ray of the left ankle which showed no acute bony pathology. Objective Constitutional Pulse regular. Respirations normal and unlabored. Afebrile. Vitals Time Taken: 2:29 PM, Height: 69 in, Weight: 174.8 lbs, BMI: 25.8, Pulse: 56 bpm, Respiratory Rate: 16 breaths/min, Blood Pressure: 154/57 mmHg. [name], [name]. ([id number]) Eyes Nonicteric. Reactive to light. Ears, Nose, Mouth, and Throat Lips, teeth, and gums WNL.Marland Kitchen Moist mucosa without lesions. Neck supple and nontender. No palpable supraclavicular or cervical adenopathy. Normal sized without goiter. Respiratory WNL. No retractions.. Breath sounds WNL, No rubs, rales, rhonchi, or  wheeze.. Cardiovascular Heart rhythm and rate regular, no murmur or gallop.. Pedal Pulses WNL. No clubbing, cyanosis or edema. Chest Breasts symmetical and no nipple discharge.. Breast tissue WNL, no masses, lumps, or tenderness.. Lymphatic No adneopathy. No adenopathy. No adenopathy. Musculoskeletal Adexa without tenderness or enlargement.. Digits and nails w/o clubbing, cyanosis, infection, petechiae, ischemia, or inflammatory conditions.Marland Kitchen Psychiatric Judgement and insight Intact.. No evidence of depression, anxiety, or agitation.. General Notes: the wound is significantly looking better and there is no sharp debridement required today Integumentary (Hair, Skin) No suspicious lesions. No crepitus or fluctuance. No peri-wound warmth or erythema. No masses.. Wound #1 status is Open. Original cause of wound was Pressure Injury. The wound is located on the Left,Lateral Malleolus. The wound measures 0.4cm length x 0.3cm width x 0.1cm depth; 0.094cm^2 area and 0.009cm^3 volume. There is Fat Layer (Subcutaneous Tissue) Exposed exposed. There is no tunneling or undermining noted. There is a large amount of serous drainage noted. The wound margin is distinct with the outline attached to the wound base. There is no granulation within the wound bed. There is a large (67- 100%) amount of necrotic tissue within the wound bed including Eschar and Adherent Slough. The periwound skin appearance exhibited: Erythema. The surrounding wound skin color is noted with erythema which is circumferential. Periwound temperature was noted as No Abnormality. The periwound has tenderness on palpation. Assessment [name], [name] ([id number]) Active Problems ICD-10 [phone] - Pressure ulcer of left ankle, stage 2 F02.80 - Dementia in other diseases classified elsewhere without behavioral disturbance Plan Wound Cleansing: Wound #1 Left,Lateral Malleolus: Clean wound with Normal Saline. May Shower, gently pat  wound dry prior to applying new dressing. Anesthetic: Wound #1 Left,Lateral Malleolus: Topical Lidocaine 4% cream applied to wound bed prior to debridement - for clinic use Skin Barriers/Peri-Wound Care: Wound #1 Left,Lateral Malleolus: Skin Prep Primary Wound Dressing: Wound #1 Left,Lateral Malleolus: Iodosorb Ointment Secondary Dressing: Wound #1 Left,Lateral Malleolus: Dry Gauze Boardered Foam Dressing Follow-up Appointments: Wound #1 Left,Lateral Malleolus: Return Appointment in 1 week. Edema Control: Wound #1 Left,Lateral Malleolus: Elevate legs to the level of the heart and pump ankles as often as possible Off-Loading: Wound #1 Left,Lateral Malleolus: Turn and reposition every 2 hours Other: - keep pressure off of affected area Additional Orders / Instructions: Wound #1 Left,Lateral Malleolus: Increase protein intake. Medications-please add to medication list.: Wound #1 Left,Lateral Malleolus: Other: - Vitamin A, Vitamin C, Zinc, MVI [name], Jared M. ([id number]) I have recommended: 1. Iodosorb ointment to be applied daily, and a protective bordered foam. This was not affordable so they're using Medihoney. 2. Off-Loading  has been discussed in great detail 3. adequate protein, vitamin A, vitamin C and zinc 4. Regular visits to the wound center Electronic Signature(s) Signed: 07/17/2016 2:49:21 PM By: Jared Fudge MD, [name] Entered By: [name] on 07/17/2016 14:49:20 [name] ([id number]) -------------------------------------------------------------------------------- SuperBill Details Patient Name: [name], [name]. Date of Service: 07/17/2016 Medical Record Number: [id number] Patient Account Number: [id number] Date of Birth/Sex: 1935/04/18 (82 y.o. Male) Treating RN: [name] Primary Care Provider: Lynett Ho Other Clinician: Referring Provider: Lynett Ho Treating Provider/Extender: [name] Service Line: Outpatient Weeks in  Treatment: 7 Diagnosis Coding ICD-10 Codes Code Description [id number] Pressure ulcer of left ankle, stage 2 F02.80 Dementia in other diseases classified elsewhere without behavioral disturbance Facility Procedures CPT4 Code: [id number] Description: [phone] - WOUND CARE VISIT-LEV 2 EST PT Modifier: Quantity: 1 Physician Procedures CPT4: Description Modifier Quantity Code [id number] - WC PHYS LEVEL 3 - EST PT 1 ICD-10 Description Diagnosis L89.522 Pressure ulcer of left ankle, stage 2 F02.80 Dementia in other diseases classified elsewhere without behavioral disturbance Electronic Signature(s) Signed: 07/17/2016 2:49:32 PM By: Jared Fudge MD, [name] Entered By: [name] on 07/17/2016 14:49:32

## 2016-07-22 ENCOUNTER — Ambulatory Visit: Payer: PPO | Admitting: Physical Therapy

## 2016-07-22 DIAGNOSIS — R293 Abnormal posture: Secondary | ICD-10-CM

## 2016-07-22 DIAGNOSIS — M6281 Muscle weakness (generalized): Secondary | ICD-10-CM | POA: Diagnosis not present

## 2016-07-22 DIAGNOSIS — R296 Repeated falls: Secondary | ICD-10-CM

## 2016-07-22 DIAGNOSIS — R2689 Other abnormalities of gait and mobility: Secondary | ICD-10-CM

## 2016-07-22 NOTE — Therapy (Signed)
[facility] [address] [place], [place], [address] Phone: [phone]   Fax:  [phone]  Physical Therapy Treatment  Patient Details  Name: [name] MRN: [id number] Date of Birth: [birth year] Referring Provider: Jennings Books, MD  Encounter Date: 07/22/2016      PT End of Session - 07/22/16 1715    Visit Number 11   Number of Visits 16   Date for PT Re-Evaluation 08/06/16   Authorization - Visit Number 11   Authorization - Number of Visits 18   PT Start Time 6195   PT Stop Time 0932   PT Time Calculation (min) 54 min   Equipment Utilized During Treatment Gait belt   Activity Tolerance Patient tolerated treatment well   Behavior During Therapy [facility] for tasks assessed/performed      Past Medical History:  Diagnosis Date  . Absolute anemia 09/11/2014  . Anxiety   . Atrial fibrillation ([place]) 01/23/2014  . BP (high blood pressure) 11/07/2013  . Carotid artery disease ([place]) 09/11/2014  . Complete rotator cuff rupture of left shoulder 12/30/2013  . Dementia 09/11/2014  . Depression   . Heart murmur   . HLD (hyperlipidemia) 01/23/2014  . Hypertension   . Obstructive apnea 11/07/2013  . Personal history of other diseases of the circulatory system 01/23/2014  . Sleep apnea   . 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 Back Surgery- Tumor Removal    . SHOULDER SURGERY Right 2007    There were no vitals filed for this visit.      Subjective Assessment - 07/22/16 1257    Subjective Patient has no pain, pt's wife reports he hasn't been using the cane as much at home. Pt. has been having some discomfort with area of back that has hx of herniation and has seen the doctor about it.    Patient is accompained by: Family member   Pertinent History Hx of left knee pain, low back pain, shoulder pain. Hx of falls, dementia, and teretoma of the spine   Limitations  Sitting;Lifting;Standing;Walking;House hold activities   Diagnostic tests BERG   Patient Stated Goals get stronger, pick up legs better when walking, getting up easier and faster   Currently in Pain? No/denies      TherEx Side step over 3" step with decreased UE assistance Sit to stands with focus on no UE assistance Nautilus: rows 20lb x10, straight arm rows x10,  Standing with back against door to promote upright posture 5x 10 second holds.  Neuro LSVT Big inspired seated exercises: 1. Hands from knees to neutral supination in abduction and back to knees. 2. Thoracic rotation stretch with face following arm. 3. Flexing forward to ground and returning to up in the air. LSVT Big inspired standing exercise: take a big step forward and clap, then step back. Performed w/o LOB  Gait Large steps in // bars for carryover. Cues for heel strike and length of stride. Ambulate with SPC in hallway with cues for heel strike, step length, arm swing, and posture.  Four corner cone: walk forward 10 ft, side step 10 ft, walk backwards 10 ft, then side step again. With SPC. Cues for body mechanics and upright posture but no LOB and good control.    Pt. Response to medical necessity: patient has demonstrated improved strength and balance but continues to have deficits affecting activities of daily living, ambulatory abilities  and posture. Patient will benefit from continued skilled physical therapy to improve safe ambulation and quality of life.         PT Long Term Goals - 07/09/16 1703      PT LONG TERM GOAL #1   Title Patient will increase score on Berg Balance to 49/56 for decreased fall risk.    Baseline 3/7: 50/56 ; 2/8: 44/56   Time 4   Period Weeks   Status Achieved     PT LONG TERM GOAL #2   Title Patient will have gross 4/5 bilateral LE strength to increase ability to perform functional activities.    Baseline Gross LLE 4-/5 knee flexion 3+/5, RLE 4/5   Time 4   Period Weeks    Status On-going     PT LONG TERM GOAL #3   Title Patient will ambulate 5 minutes with a cane without LOB to increase community mobility.     Baseline Patient able to walk 5 minutes with cane but with poor posture.    Time 4   Period Weeks   Status Achieved     PT LONG TERM GOAL #4   Title Patient will perform 10 sit to stands without use of hands or LOB to demonstrate improved capacity for functional activities.    Baseline Patient can perform 10 sit to stands with no hands with decreased eccentric control with fatigue.    Time 4   Period Weeks   Status Achieved     PT LONG TERM GOAL #5   Title Patient will have upright posture for 10 minutes to allow for better body mechanics and decreased back pain.    Baseline Patient requires cueing ~ every 2 minutes   Time 4   Period Weeks   Status On-going     Additional Long Term Goals   Additional Long Term Goals Yes     PT LONG TERM GOAL #6   Title Patient will not have SI/back pain while ambulating to allow for increased community mobility.    Baseline Patient has occasional 5/10 back pain while ambulating   Time 4   Period Weeks   Status On-going     PT LONG TERM GOAL #7   Title Patient will perform 10 sit to stands from surface lower than PT room chairs to increase independence in home setting due to low height of furniture at home.    Baseline Patient can perform 10 sit to stands with fatigue from chair height, able to perform 1 with hands on knees from lowered surface.    Time 4   Period Weeks   Status New     PT LONG TERM GOAL #8   Title Patient will score a 52/56 on Berg to decrease fall risk to minimal and increase single limb abilities to improve carryover to household activities such as stepping into shower.    Baseline 3/7: 50/56, extreme difficulty stepping into shower   Time 4   Period Weeks   Status New            Plan - 07/22/16 1720    Clinical Impression Statement Patient continues to require frequent  cues for upright posture due to tendency/preference for trunk flexion. Festination continues to present itself during gait. LSVT Big inspired exercises were implemented with good carryover to gait. Patient demonstrates ability to negotiate obstacles with progression to ambulating forward, side stepping, and backwards walking with SPC outside of // bars in open gym. Side steps over 6" step  continue to challenge pt. and will continue to be encouraged to practice in order to increase pt.'s independence in getting in and out of the shower. Pt. will continue to benefit from skilled physical therapy to decrease fall risk, improve gross strength and ROM, control pain and allow patient safe ambulation in the community for improved quality of life.    Rehab Potential Fair   Clinical Impairments Affecting Rehab Potential hx of falls, dementia, teratoma, mitral valve polapse, cancer, a fib, L knee injury, L shoulder, back pain   PT Frequency 2x / week   PT Duration 4 weeks   PT Treatment/Interventions ADLs/Self Care Home Management;Aquatic Therapy;Cryotherapy;Electrical Stimulation;Biofeedback;Moist Heat;DME Instruction;Gait training;Stair training;Functional mobility training;Therapeutic activities;Therapeutic exercise;Balance training;Neuromuscular re-education;Patient/family education;Orthotic Fit/Training;Manual techniques;Passive range of motion;Dry needling;Energy conservation   PT Next Visit Plan cane outside, side stepping over 6", shower step practice,    PT Home Exercise Plan see sheet   Consulted and Agree with Plan of Care Patient;Family member/caregiver   Family Member Consulted wife      Patient will benefit from skilled therapeutic intervention in order to improve the following deficits and impairments:  Abnormal gait, Decreased activity tolerance, Decreased balance, Decreased mobility, Decreased knowledge of use of DME, Decreased endurance, Decreased range of motion, Decreased safety awareness,  Decreased skin integrity, Decreased knowledge of precautions, Decreased strength, Hypomobility, Difficulty walking, Impaired flexibility, Postural dysfunction, Improper body mechanics, Decreased cognition  Visit Diagnosis: Muscle weakness (generalized)  Other abnormalities of gait and mobility  Repeated falls  Abnormal posture     Problem List Patient Active Problem List   Diagnosis Date Noted  . Fall 02/18/2016  . Scalp laceration 02/18/2016  . Splenic laceration 02/18/2016  . Acute blood loss anemia 02/18/2016  . Multiple rib fractures 02/16/2016  . Mild dementia 04/10/2015  . Aggrieved 03/02/2015  . Absolute anemia 09/11/2014  . Carotid artery disease ([place]) 09/11/2014  . Dementia 09/11/2014  . History of repair of rotator cuff 03/02/2014  . Atrial fibrillation with RVR ([place]) 01/23/2014  . Personal history of other diseases of the circulatory system 01/23/2014  . HLD (hyperlipidemia) 01/23/2014  . Complete rotator cuff rupture of left shoulder 12/30/2013  . BP (high blood pressure) 11/07/2013  . Amnesia 11/07/2013  . Obstructive apnea 11/07/2013   Pura Spice, PT, DPT # [id number] Janna Arch, SPT 07/23/2016, 8:41 AM  [facility] [address] [place], [place], [address] Phone: [phone]   Fax:  [phone]  Name: [name] MRN: [id number] Date of Birth: 04-04-1935

## 2016-07-23 ENCOUNTER — Encounter: Payer: Self-pay | Admitting: Physical Therapy

## 2016-07-24 ENCOUNTER — Ambulatory Visit: Payer: PPO | Admitting: Physical Therapy

## 2016-07-24 ENCOUNTER — Encounter: Payer: PPO | Admitting: Surgery

## 2016-07-24 DIAGNOSIS — M6281 Muscle weakness (generalized): Secondary | ICD-10-CM | POA: Diagnosis not present

## 2016-07-24 DIAGNOSIS — R293 Abnormal posture: Secondary | ICD-10-CM

## 2016-07-24 DIAGNOSIS — L89522 Pressure ulcer of left ankle, stage 2: Secondary | ICD-10-CM | POA: Diagnosis not present

## 2016-07-24 DIAGNOSIS — R296 Repeated falls: Secondary | ICD-10-CM

## 2016-07-24 DIAGNOSIS — R2689 Other abnormalities of gait and mobility: Secondary | ICD-10-CM

## 2016-07-24 NOTE — Therapy (Signed)
[facility] [address] [place], [place], [address] Phone: [phone]   Fax:  [phone]  Physical Therapy Treatment  Patient Details  Name: [name] MRN: [id number] Date of Birth: 07/29/1934 Referring Provider: Jennings Books, MD  Encounter Date: 07/24/2016      PT End of Session - 07/24/16 1712    Visit Number 12   Number of Visits 16   Date for PT Re-Evaluation 08/06/16   Authorization - Visit Number 12   Authorization - Number of Visits 18   PT Start Time 1252   PT Stop Time 1346   PT Time Calculation (min) 54 min   Equipment Utilized During Treatment Gait belt   Activity Tolerance Patient tolerated treatment well   Behavior During Therapy [facility] for tasks assessed/performed      Past Medical History:  Diagnosis Date  . Absolute anemia 09/11/2014  . Anxiety   . Atrial fibrillation ([place]) 01/23/2014  . BP (high blood pressure) 11/07/2013  . Carotid artery disease ([place]) 09/11/2014  . Complete rotator cuff rupture of left shoulder 12/30/2013  . Dementia 09/11/2014  . Depression   . Heart murmur   . HLD (hyperlipidemia) 01/23/2014  . Hypertension   . Obstructive apnea 11/07/2013  . Personal history of other diseases of the circulatory system 01/23/2014  . Sleep apnea   . 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 Back Surgery- Tumor Removal    . SHOULDER SURGERY Right 2007    There were no vitals filed for this visit.   TherEx     Subjective Assessment - 07/24/16 1710    Subjective Pt.'s wife reports that patient has been having difficulty carrying over tasks to home and frequently forgets. She has to remind him constantly to do HEP and to stand tall.    Patient is accompained by: Family member   Pertinent History Hx of left knee pain, low back pain, shoulder pain. Hx of falls, dementia, and teretoma of the spine   Limitations  Sitting;Lifting;Standing;Walking;House hold activities   Diagnostic tests BERG   Patient Stated Goals get stronger, pick up legs better when walking, getting up easier and faster   Currently in Pain? No/denies       Stand tall: bring fists together in front of body and behind body.claps 3x60 seconds. Great upright posture 2lb abduction 2x10, flexion straight arm 2x10, torso twist 2x10 Side steps 2x8 each side no UE support. Good control.  Hip extensions at parallel bars. Pt. Reports feeling stretch, needed frequent cues for upright posture Pelvic rotation circles 8x with emphasis on trunk extension nustep lvl 6 10 minutes ( no charge) Laying supine 3 minutes with pillow under hip to promote extension of spine and   Gait. Education on ambulating with cane Large steps in // bars for carryover. Cues for heel strike and length of stride Ambulating with cane within facility multiple trials with cues for step length, heel strike, lifting knees. Occasional cues for posture.     Pt. Response to medical necessity: patient has demonstrated improved strength and balance but continues to have deficits affecting activities of daily living, ambulatory abilities and posture. Patient will benefit from continued skilled physical therapy to improve safe ambulation and quality of life.         PT Long Term Goals - 07/09/16 1703      PT LONG TERM GOAL #1  Title Patient will increase score on Berg Balance to 49/56 for decreased fall risk.    Baseline 3/7: 50/56 ; 2/8: 44/56   Time 4   Period Weeks   Status Achieved     PT LONG TERM GOAL #2   Title Patient will have gross 4/5 bilateral LE strength to increase ability to perform functional activities.    Baseline Gross LLE 4-/5 knee flexion 3+/5, RLE 4/5   Time 4   Period Weeks   Status On-going     PT LONG TERM GOAL #3   Title Patient will ambulate 5 minutes with a cane without LOB to increase community mobility.     Baseline Patient able to  walk 5 minutes with cane but with poor posture.    Time 4   Period Weeks   Status Achieved     PT LONG TERM GOAL #4   Title Patient will perform 10 sit to stands without use of hands or LOB to demonstrate improved capacity for functional activities.    Baseline Patient can perform 10 sit to stands with no hands with decreased eccentric control with fatigue.    Time 4   Period Weeks   Status Achieved     PT LONG TERM GOAL #5   Title Patient will have upright posture for 10 minutes to allow for better body mechanics and decreased back pain.    Baseline Patient requires cueing ~ every 2 minutes   Time 4   Period Weeks   Status On-going     Additional Long Term Goals   Additional Long Term Goals Yes     PT LONG TERM GOAL #6   Title Patient will not have SI/back pain while ambulating to allow for increased community mobility.    Baseline Patient has occasional 5/10 back pain while ambulating   Time 4   Period Weeks   Status On-going     PT LONG TERM GOAL #7   Title Patient will perform 10 sit to stands from surface lower than PT room chairs to increase independence in home setting due to low height of furniture at home.    Baseline Patient can perform 10 sit to stands with fatigue from chair height, able to perform 1 with hands on knees from lowered surface.    Time 4   Period Weeks   Status New     PT LONG TERM GOAL #8   Title Patient will score a 52/56 on Berg to decrease fall risk to minimal and increase single limb abilities to improve carryover to household activities such as stepping into shower.    Baseline 3/7: 50/56, extreme difficulty stepping into shower   Time 4   Period Weeks   Status New               Plan - 07/24/16 1719    Clinical Impression Statement Patient presented to therapy with improved postural control. Decreased need for cues for posture were required. Gait deviations of shortened step length, decreased arm swing, decreased hip extension, and  occasional festination still present. Side stepping without support was improved with no episodes of LOB. Pt.'s wife concerned about carryover at home due to patient's short term memory deficits. Sheet for cane ambulation cues given to pt. and pt. wife in addition to calendar for pt. to check when he completed HEP because he frequently forgets if he did it or not. Pt. was challenged by prone position but reported decreased back irritation after/during prone position.  Trunk extension in standing was improved though still limited. Patient will continue to benefit from skilled physical therapy to decrease fall risk, improve gross strength and ROM, control pain and allow patient safe ambulation in community for improved quality of life.    Rehab Potential Fair   Clinical Impairments Affecting Rehab Potential hx of falls, dementia, teratoma, mitral valve polapse, cancer, a fib, L knee injury, L shoulder, back pain   PT Frequency 2x / week   PT Duration 4 weeks   PT Treatment/Interventions ADLs/Self Care Home Management;Aquatic Therapy;Cryotherapy;Electrical Stimulation;Biofeedback;Moist Heat;DME Instruction;Gait training;Stair training;Functional mobility training;Therapeutic activities;Therapeutic exercise;Balance training;Neuromuscular re-education;Patient/family education;Orthotic Fit/Training;Manual techniques;Passive range of motion;Dry needling;Energy conservation   PT Next Visit Plan cane, look up memory carryover, silver sneaker information, senior center information   PT Home Exercise Plan see sheet   Consulted and Agree with Plan of Care Patient;Family member/caregiver   Family Member Consulted wife      Patient will benefit from skilled therapeutic intervention in order to improve the following deficits and impairments:  Abnormal gait, Decreased activity tolerance, Decreased balance, Decreased mobility, Decreased knowledge of use of DME, Decreased endurance, Decreased range of motion, Decreased  safety awareness, Decreased skin integrity, Decreased knowledge of precautions, Decreased strength, Hypomobility, Difficulty walking, Impaired flexibility, Postural dysfunction, Improper body mechanics, Decreased cognition  Visit Diagnosis: Muscle weakness (generalized)  Other abnormalities of gait and mobility  Repeated falls  Abnormal posture     Problem List Patient Active Problem List   Diagnosis Date Noted  . Fall 02/18/2016  . Scalp laceration 02/18/2016  . Splenic laceration 02/18/2016  . Acute blood loss anemia 02/18/2016  . Multiple rib fractures 02/16/2016  . Mild dementia 04/10/2015  . Aggrieved 03/02/2015  . Absolute anemia 09/11/2014  . Carotid artery disease ([place]) 09/11/2014  . Dementia 09/11/2014  . History of repair of rotator cuff 03/02/2014  . Atrial fibrillation with RVR ([place]) 01/23/2014  . Personal history of other diseases of the circulatory system 01/23/2014  . HLD (hyperlipidemia) 01/23/2014  . Complete rotator cuff rupture of left shoulder 12/30/2013  . BP (high blood pressure) 11/07/2013  . Amnesia 11/07/2013  . Obstructive apnea 11/07/2013   Pura Spice, PT, DPT # [place], SPT 07/25/2016, 11:18 AM  [facility] [address] [place], [place], [address] Phone: [phone]   Fax:  [phone]  Name: [name] MRN: [id number] Date of Birth: 07/20/1934

## 2016-07-26 NOTE — Progress Notes (Signed)
YAMAN, GRAUBERGER ([id number]) Visit Report for 07/24/2016 Arrival Information Details Patient Name: [name], [name]. Date of Service: 07/24/2016 2:30 PM Medical Record Number: [id number] Patient Account Number: [id number] Date of Birth/Sex: 1934/10/30 (82 y.o. Male) Treating RN: Ahmed Prima Primary Care Ogechi Kuehnel: [name] Other Clinician: Referring Adolph Clutter: [name] Treating Irvin Lizama/Extender: [name] in Treatment: 8 Visit Information History Since Last Visit All ordered tests and consults were completed: No Patient Arrived: [name] Added or deleted any medications: No Arrival Time: 14:43 Any new allergies or adverse reactions: No Accompanied By: wife Had a fall or experienced change in No Transfer Assistance: EasyPivot activities of daily living that may affect Patient Lift risk of falls: Patient Identification Verified: Yes Signs or symptoms of abuse/neglect since last No Secondary Verification Process Yes visito Completed: Hospitalized since last visit: No Patient Requires Transmission- No Has Dressing in Place as Prescribed: Yes Based Precautions: Pain Present Now: No Patient Has Alerts: Yes Patient Alerts: ASA Electronic Signature(s) Signed: 07/25/2016 4:34:11 PM By: Alric Quan Entered By: Alric Quan on 07/24/2016 14:44:16 [name] ([id number]) -------------------------------------------------------------------------------- Encounter Discharge Information Details Patient Name: [name], [name]. Date of Service: 07/24/2016 2:30 PM Medical Record Number: [id number] Patient Account Number: [id number] Date of Birth/Sex: 08-Sep-1934 (82 y.o. Male) Treating RN: Ahmed Prima Primary Care Rilan Eiland: [name] Other Clinician: Referring Johsua Shevlin: [name] Treating Kenneshia Rehm/Extender: [name] in Treatment: 8 Encounter Discharge Information Items Discharge Pain Level: 0 Discharge Condition:  Stable Ambulatory Status: Cane Discharge Destination: Home Transportation: Private Auto Accompanied By: wife Schedule Follow-up Appointment: Yes Medication Reconciliation completed No and provided to Patient/Care Taji Sather: Provided on Clinical Summary of Care: 07/24/2016 Form Type Recipient Paper Patient ET Electronic Signature(s) Signed: 07/24/2016 3:08:10 PM By: Ruthine Dose Entered By: Ruthine Dose on 07/24/2016 15:08:10 [name] ([id number]) -------------------------------------------------------------------------------- Lower Extremity Assessment Details Patient Name: [name], [name]. Date of Service: 07/24/2016 2:30 PM Medical Record Number: [id number] Patient Account Number: [id number] Date of Birth/Sex: 17-Jul-1934 (82 y.o. Male) Treating RN: Ahmed Prima Primary Care Tedi Hughson: [name] Other Clinician: Referring Maxton Noreen: [name] Treating Magdelene Ruark/Extender: [name] in Treatment: 8 Vascular Assessment Pulses: Dorsalis Pedis Palpable: [Left:Yes] Posterior Tibial Extremity colors, hair growth, and conditions: Extremity Color: [Left:Normal] Temperature of Extremity: [Left:Warm] Capillary Refill: [Left:< 3 seconds] Electronic Signature(s) Signed: 07/25/2016 4:34:11 PM By: Alric Quan Entered By: Alric Quan on 07/24/2016 14:50:38 [name] ([id number]) -------------------------------------------------------------------------------- Multi Wound Chart Details Patient Name: [name]. Date of Service: 07/24/2016 2:30 PM Medical Record Number: [id number] Patient Account Number: [id number] Date of Birth/Sex: 16-Dec-1934 (82 y.o. Male) Treating RN: Ahmed Prima Primary Care Kayan Blissett: [name] Other Clinician: Referring Alex Leahy: [name] Treating Jerome Otter/Extender: [name] in Treatment: 8 Vital Signs Height(in): 69 Pulse(bpm): 58 Weight(lbs): 174.8 Blood  Pressure 128/60 (mmHg): Body Mass Index(BMI): 26 Temperature(F): Respiratory Rate 16 (breaths/min): Photos: [1:No Photos] [N/A:N/A] Wound Location: [1:Left Malleolus - Lateral] [N/A:N/A] Wounding Event: [1:Pressure Injury] [N/A:N/A] Primary Etiology: [1:Pressure Ulcer] [N/A:N/A] Comorbid History: [1:Cataracts, Anemia, Sleep Apnea, Arrhythmia, Hypertension, Dementia] [N/A:N/A] Date Acquired: [1:05/01/2016] [N/A:N/A] Weeks of Treatment: [1:8] [N/A:N/A] Wound Status: [1:Open] [N/A:N/A] Measurements L x W x D 0.2x0.2x0.1 [N/A:N/A] (cm) Area (cm) : [1:0.031] [N/A:N/A] Volume (cm) : [1:0.003] [N/A:N/A] % Reduction in Area: [1:84.20%] [N/A:N/A] % Reduction in Volume: 85.00% [N/A:N/A] Classification: [1:Category/Stage II] [N/A:N/A] Exudate Amount: [1:Large] [N/A:N/A] Exudate Type: [1:Serous] [N/A:N/A] Exudate Color: [1:amber] [N/A:N/A] Wound Margin: [1:Distinct, outline attached] [N/A:N/A] Granulation Amount: [1:None Present (0%)] [N/A:N/A] Necrotic Amount: [  1:Large (67-100%)] [N/A:N/A] Necrotic Tissue: [1:Eschar, Adherent Slough] [N/A:N/A] Exposed Structures: [1:Fat Layer (Subcutaneous Tissue) Exposed: Yes] [N/A:N/A] Epithelialization: [1:None] [N/A:N/A] Debridement: [1:Debridement ([id number])] [N/A:N/A] Pre-procedure 14:54 N/A N/A Verification/Time Out Taken: Pain Control: Lidocaine 4% Topical N/A N/A Solution Tissue Debrided: Fibrin/Slough, Exudates, N/A N/A Subcutaneous Level: Skin/Subcutaneous N/A N/A Tissue Debridement Area (sq 0.04 N/A N/A cm): Instrument: Forceps, Scissors N/A N/A Bleeding: Minimum N/A N/A Hemostasis Achieved: Pressure N/A N/A Procedural Pain: 0 N/A N/A Post Procedural Pain: 0 N/A N/A Debridement Treatment Procedure was tolerated N/A N/A Response: well Post Debridement 0.4x0.3x0.2 N/A N/A Measurements L x W x D (cm) Post Debridement 0.019 N/A N/A Volume: (cm) Post Debridement Category/Stage II N/A N/A Stage: Periwound Skin  Texture: No Abnormalities Noted N/A N/A Periwound Skin No Abnormalities Noted N/A N/A Moisture: Periwound Skin Color: Erythema: Yes N/A N/A Erythema Location: Circumferential N/A N/A Temperature: No Abnormality N/A N/A Tenderness on Yes N/A N/A Palpation: Wound Preparation: Ulcer Cleansing: N/A N/A Rinsed/Irrigated with Saline Topical Anesthetic Applied: Other: lidocaine 4% Procedures Performed: Debridement N/A N/A Treatment Notes Wound #1 (Left, Lateral Malleolus) 1. Cleansed with: Clean wound with Normal Saline 2. Anesthetic Topical Lidocaine 4% cream to wound bed prior to debridement 3. Peri-wound Care: REASE, WENCE ([id number]) Skin Prep 4. Dressing Applied: Iodosorb Ointment 5. Secondary Dressing Applied Bordered Foam Dressing Dry Gauze Electronic Signature(s) Signed: 07/24/2016 3:10:55 PM By: Christin Fudge MD, FACS Entered By: Christin Fudge on 07/24/2016 15:10:54 [name] ([id number]) -------------------------------------------------------------------------------- Multi-Disciplinary Care Plan Details Patient Name: [name], [name]. Date of Service: 07/24/2016 2:30 PM Medical Record Number: [id number] Patient Account Number: [id number] Date of Birth/Sex: 1935-05-02 (82 y.o. Male) Treating RN: Ahmed Prima Primary Care Armanda Forand: [name] Other Clinician: Referring Jaliana Medellin: [name] Treating Nichola Cieslinski/Extender: [name] in Treatment: 8 Active Inactive ` Abuse / Safety / Falls / Self Care Management Nursing Diagnoses: Potential for falls Goals: Patient will remain injury free Date Initiated: 05/29/2016 Target Resolution Date: 08/02/2016 Goal Status: Active Interventions: Assess fall risk on admission and as needed Assess impairment of mobility on admission and as needed per policy Notes: ` Nutrition Nursing Diagnoses: Imbalanced nutrition Goals: Patient/caregiver agrees to and verbalizes understanding of need to use  nutritional supplements and/or vitamins as prescribed Date Initiated: 05/29/2016 Target Resolution Date: 08/02/2016 Goal Status: Active Interventions: Assess patient nutrition upon admission and as needed per policy Notes: ` Orientation to the Wound Care Program Nursing Diagnoses: ARROW, TOMKO ([id number]) Knowledge deficit related to the wound healing center program Goals: Patient/caregiver will verbalize understanding of the [place] Program Date Initiated: 05/29/2016 Target Resolution Date: 06/14/2016 Goal Status: Active Interventions: Provide education on orientation to the wound center Notes: ` Pain, Acute or Chronic Nursing Diagnoses: Pain, acute or chronic: actual or potential Potential alteration in comfort, pain Goals: Patient will verbalize adequate pain control and receive pain control interventions during procedures as needed Date Initiated: 05/29/2016 Target Resolution Date: 08/02/2016 Goal Status: Active Interventions: Complete pain assessment as per visit requirements Notes: ` Wound/Skin Impairment Nursing Diagnoses: Impaired tissue integrity Knowledge deficit related to ulceration/compromised skin integrity Goals: Ulcer/skin breakdown will have a volume reduction of 80% by week 12 Date Initiated: 05/29/2016 Target Resolution Date: 08/02/2016 Goal Status: Active Interventions: Assess patient/caregiver ability to perform ulcer/skin care regimen upon admission and as needed Assess ulceration(s) every visit Notes: QUAN, CYBULSKI ([id number]) Electronic Signature(s) Signed: 07/25/2016 4:34:11 PM By: Alric Quan Entered By: Alric Quan on 07/24/2016 14:50:44 [name] ([id number]) -------------------------------------------------------------------------------- Pain Assessment Details  Patient Name: [name], [name]. Date of Service: 07/24/2016 2:30 PM Medical Record Number: [id number] Patient Account Number: [id number] Date  of Birth/Sex: 1935/01/23 (82 y.o. Male) Treating RN: Ahmed Prima Primary Care Rolland Steinert: [name] Other Clinician: Referring Stephonie Wilcoxen: [name] Treating Jerimyah Vandunk/Extender: [name] in Treatment: 8 Active Problems Location of Pain Severity and Description of Pain Patient Has Paino No Site Locations With Dressing Change: No Pain Management and Medication Current Pain Management: Electronic Signature(s) Signed: 07/25/2016 4:34:11 PM By: Alric Quan Entered By: Alric Quan on 07/24/2016 14:44:23 [name] ([id number]) -------------------------------------------------------------------------------- Patient/Caregiver Education Details Patient Name: [name], [name]. Date of Service: 07/24/2016 2:30 PM Medical Record Number: [id number] Patient Account Number: [id number] Date of Birth/Gender: [birth year] (82 y.o. Male) Treating RN: Ahmed Prima Primary Care Physician: [name] Other Clinician: Referring Physician: [name] Treating Physician/Extender: [name] in Treatment: 8 Education Assessment Education Provided To: Patient Education Topics Provided Wound/Skin Impairment: Handouts: Other: change dressing as ordered Methods: Demonstration, Explain/Verbal Responses: State content correctly Electronic Signature(s) Signed: 07/25/2016 4:34:11 PM By: Alric Quan Entered By: Alric Quan on 07/24/2016 15:02:45 [name] ([id number]) -------------------------------------------------------------------------------- Wound Assessment Details Patient Name: [name], [name]. Date of Service: 07/24/2016 2:30 PM Medical Record Number: [id number] Patient Account Number: [id number] Date of Birth/Sex: Feb 04, 1935 (82 y.o. Male) Treating RN: Ahmed Prima Primary Care Zoanne Newill: [name] Other Clinician: Referring Faheem Ziemann: [name] Treating Rogenia Werntz/Extender: [name] in Treatment:  8 Wound Status Wound Number: 1 Primary Pressure Ulcer Etiology: Wound Location: Left Malleolus - Lateral Wound Open Wounding Event: Pressure Injury Status: Date Acquired: 05/01/2016 Comorbid Cataracts, Anemia, Sleep Apnea, Weeks Of Treatment: 8 History: Arrhythmia, Hypertension, Dementia Clustered Wound: No Photos Photo Uploaded By: Alric Quan on 07/24/2016 16:16:59 Wound Measurements Length: (cm) 0.2 Width: (cm) 0.2 Depth: (cm) 0.1 Area: (cm) 0.031 Volume: (cm) 0.003 % Reduction in Area: 84.2% % Reduction in Volume: 85% Epithelialization: None Tunneling: No Undermining: No Wound Description Classification: Category/Stage II Foul Odor Afte Wound Margin: Distinct, outline attached Slough/Fibrino Exudate Amount: Large Exudate Type: Serous Exudate Color: amber r Cleansing: No No Wound Bed Granulation Amount: None Present (0%) Exposed Structure Necrotic Amount: Large (67-100%) Fat Layer (Subcutaneous Tissue) Exposed: Yes Necrotic Quality: Eschar, Adherent [address] [name], [name]. ([id number]) Texture Color No Abnormalities Noted: No No Abnormalities Noted: No Erythema: Yes Moisture Erythema Location: Circumferential No Abnormalities Noted: No Temperature / Pain Temperature: No Abnormality Tenderness on Palpation: Yes Wound Preparation Ulcer Cleansing: Rinsed/Irrigated with Saline Topical Anesthetic Applied: Other: lidocaine 4%, Treatment Notes Wound #1 (Left, Lateral Malleolus) 1. Cleansed with: Clean wound with Normal Saline 2. Anesthetic Topical Lidocaine 4% cream to wound bed prior to debridement 3. Peri-wound Care: Skin Prep 4. Dressing Applied: Iodosorb Ointment 5. Secondary Dressing Applied Bordered Foam Dressing Dry Gauze Electronic Signature(s) Signed: 07/25/2016 4:34:11 PM By: Alric Quan Entered By: Alric Quan on 07/24/2016 14:48:32 [name]  ([id number]) -------------------------------------------------------------------------------- Vitals Details Patient Name: [name], [name]. Date of Service: 07/24/2016 2:30 PM Medical Record Number: [id number] Patient Account Number: [id number] Date of Birth/Sex: 26-Oct-1934 (82 y.o. Male) Treating RN: Ahmed Prima Primary Care Naquita Nappier: [name] Other Clinician: Referring Jenita Rayfield: [name] Treating Breshay Ilg/Extender: [name] in Treatment: 8 Vital Signs Time Taken: 14:48 Pulse (bpm): 58 Height (in): 69 Respiratory Rate (breaths/min): 16 Weight (lbs): 174.8 Blood Pressure (mmHg): 128/60 Body Mass Index (BMI): 25.8 Reference Range: 80 - 120 mg / dl Electronic Signature(s) Signed: 07/25/2016 4:34:11 PM  By: Alric Quan Entered By: Alric Quan on 07/24/2016 14:48:05

## 2016-07-26 NOTE — Progress Notes (Signed)
JELANI, TRUEBA ([id number]) Visit Report for 07/24/2016 Chief Complaint Document Details Patient Name: [name], [name]. Date of Service: 07/24/2016 2:30 PM Medical Record Number: [id number] Patient Account Number: [id number] Date of Birth/Sex: [birth year] (82 y.o. Male) Treating RN: Ahmed Prima Primary Care Provider: [name] Other Clinician: Referring Provider: [name] Treating Provider/Extender: [name] in Treatment: 8 Information Obtained from: Patient Chief Complaint Patient is at the clinic for treatment of an open pressure ulcer to the left ankle which she's had for about 5 weeks Electronic Signature(s) Signed: 07/24/2016 3:11:13 PM By: Christin Fudge MD, FACS Entered By: Christin Fudge on 07/24/2016 15:11:12 [name] ([id number]) -------------------------------------------------------------------------------- Debridement Details Patient Name: [name], GUIN. Date of Service: 07/24/2016 2:30 PM Medical Record Number: [id number] Patient Account Number: [id number] Date of Birth/Sex: [birth year] (82 y.o. Male) Treating RN: Ahmed Prima Primary Care Provider: [name] Other Clinician: Referring Provider: [name] Treating Provider/Extender: [name] in Treatment: 8 Debridement Performed for Wound #1 Left,Lateral Malleolus Assessment: Performed By: Physician Christin Fudge, MD Debridement: Debridement Pre-procedure Yes - 14:54 Verification/Time Out Taken: Start Time: 14:55 Pain Control: Lidocaine 4% Topical Solution Level: Skin/Subcutaneous Tissue Total Area Debrided (L x 0.2 (cm) x 0.2 (cm) = 0.04 (cm) W): Tissue and other Viable, Non-Viable, Exudate, Fibrin/Slough, Subcutaneous material debrided: Instrument: Forceps, Scissors Bleeding: Minimum Hemostasis Achieved: Pressure End Time: 14:59 Procedural Pain: 0 Post Procedural Pain: 0 Response to Treatment: Procedure was tolerated well Post Debridement  Measurements of Total Wound Length: (cm) 0.4 Stage: Category/Stage II Width: (cm) 0.3 Depth: (cm) 0.2 Volume: (cm) 0.019 Character of Wound/Ulcer Post Requires Further Debridement: Debridement Severity of Tissue Post Fat layer exposed Debridement: Post Procedure Diagnosis Same as Pre-procedure Electronic Signature(s) Signed: 07/24/2016 3:11:05 PM By: Christin Fudge MD, FACS Signed: 07/25/2016 4:34:11 PM By: Alric Quan Entered By: Christin Fudge on 07/24/2016 15:11:05 [name], [name] ([id number], SUPPLE. ([id number]) -------------------------------------------------------------------------------- HPI Details Patient Name: [name], SHUCK. Date of Service: 07/24/2016 2:30 PM Medical Record Number: [id number] Patient Account Number: [id number] Date of Birth/Sex: [birth year] (82 y.o. Male) Treating RN: Ahmed Prima Primary Care Provider: [name] Other Clinician: Referring Provider: [name] Treating Provider/Extender: [name] in Treatment: 8 History of Present Illness Location: left ankle laterally Quality: Patient reports experiencing a dull pain to affected area(s). Severity: Patient states wound are getting worse. Duration: Patient has had the wound for < 5 weeks prior to presenting for treatment Timing: Pain in wound is Intermittent (comes and goes Context: The wound appeared gradually over time Modifying Factors: Other treatment(s) tried include:put on Bactroban and doxycycline Associated Signs and Symptoms: Patient reports having difficulty standing for long periods. HPI Description: 82 year old gentleman who is known to have a history of dementia but is fairly functional and is able to ambulate has been having a pressure ulcer to the left ankle due to laying in bed for longer than he should. Past history of atrial fibrillation, dementia, hyperlipidemia, hypertension. He's also had some orthopedic related surgeries in the past and  was a former smoker and is given up in 1969. Recently seen by his PCP who put him on doxycycline and asked him to apply Bactroban and see [place] at the wound clinic. 06/05/2016 -- had a x-ray of the left ankle which showed no acute bony pathology. Electronic Signature(s) Signed: 07/24/2016 3:11:20 PM By: Christin Fudge MD, FACS Entered By: Christin Fudge on 07/24/2016 15:11:20 [name] ([id number]) -------------------------------------------------------------------------------- Physical Exam Details Patient Name: [name],  Jared M. Date of Service: 07/24/2016 2:30 PM Medical Record Number: [id number] Patient Account Number: [id number] Date of Birth/Sex: 1934-06-26 (82 y.o. Male) Treating RN: Ahmed Prima Primary Care Provider: [name] Other Clinician: Referring Provider: [name] Treating Provider/Extender: [name] in Treatment: 8 Constitutional . Pulse regular. Respirations normal and unlabored. Afebrile. . Eyes Nonicteric. Reactive to light. Ears, Nose, Mouth, and Throat Lips, teeth, and gums WNL.Marland Kitchen Moist mucosa without lesions. Neck supple and nontender. No palpable supraclavicular or cervical adenopathy. Normal sized without goiter. Respiratory WNL. No retractions.. Breath sounds WNL, No rubs, rales, rhonchi, or wheeze.. Cardiovascular Heart rhythm and rate regular, no murmur or gallop.. Pedal Pulses WNL. No clubbing, cyanosis or edema. Chest Breasts symmetical and no nipple discharge.. Breast tissue WNL, no masses, lumps, or tenderness.. Lymphatic No adneopathy. No adenopathy. No adenopathy. Musculoskeletal Adexa without tenderness or enlargement.. Digits and nails w/o clubbing, cyanosis, infection, petechiae, ischemia, or inflammatory conditions.. Integumentary (Hair, Skin) No suspicious lesions. No crepitus or fluctuance. No peri-wound warmth or erythema. No masses.Marland Kitchen Psychiatric Judgement and insight Intact.. No evidence of depression,  anxiety, or agitation.. Notes the wound had some surrounding eschar and some subacute and his debris which I sharply removed with the forceps and scissors and overall there has been good improvement. Electronic Signature(s) Signed: 07/24/2016 3:11:51 PM By: Christin Fudge MD, FACS Entered By: Christin Fudge on 07/24/2016 15:11:49 [name] ([id number]) -------------------------------------------------------------------------------- Physician Orders Details Patient Name: [name], SPRIGG. Date of Service: 07/24/2016 2:30 PM Medical Record Number: [id number] Patient Account Number: [id number] Date of Birth/Sex: 04/27/1935 (82 y.o. Male) Treating RN: Ahmed Prima Primary Care Provider: [name] Other Clinician: Referring Provider: [name] Treating Provider/Extender: [name] in Treatment: 8 Verbal / Phone Orders: Yes Clinician: Carolyne Fiscal, Debi Read Back and Verified: Yes Diagnosis Coding Wound Cleansing Wound #1 Left,Lateral Malleolus o Clean wound with Normal Saline. o May Shower, gently pat wound dry prior to applying new dressing. Anesthetic Wound #1 Left,Lateral Malleolus o Topical Lidocaine 4% cream applied to wound bed prior to debridement - for clinic use Skin Barriers/Peri-Wound Care Wound #1 Left,Lateral Malleolus o Skin Prep Primary Wound Dressing Wound #1 Left,Lateral Malleolus o Iodosorb Ointment Secondary Dressing Wound #1 Left,Lateral Malleolus o Dry Gauze o Boardered Foam Dressing Follow-up Appointments Wound #1 Left,Lateral Malleolus o Return Appointment in 1 week. Edema Control Wound #1 Left,Lateral Malleolus o Elevate legs to the level of the heart and pump ankles as often as possible Off-Loading Wound #1 Left,Lateral Malleolus o Turn and reposition every 2 hours o Other: - keep pressure off of affected area [name], HIGHAM. ([id number]) Additional Orders / Instructions Wound #1 Left,Lateral  Malleolus o Increase protein intake. Medications-please add to medication list. Wound #1 Left,Lateral Malleolus o Other: - Vitamin A, Vitamin C, Zinc, MVI Electronic Signature(s) Signed: 07/24/2016 4:00:38 PM By: Christin Fudge MD, FACS Signed: 07/25/2016 4:34:11 PM By: Alric Quan Entered By: Alric Quan on 07/24/2016 15:01:45 [name] ([id number]) -------------------------------------------------------------------------------- Problem List Details Patient Name: [name], OLGIN. Date of Service: 07/24/2016 2:30 PM Medical Record Number: [id number] Patient Account Number: [id number] Date of Birth/Sex: 1935-01-20 (82 y.o. Male) Treating RN: Ahmed Prima Primary Care Provider: [name] Other Clinician: Referring Provider: [name] Treating Provider/Extender: [name] in Treatment: 8 Active Problems ICD-10 Encounter Code Description Active Date Diagnosis [phone] Pressure ulcer of left ankle, stage 2 05/29/2016 Yes F02.80 Dementia in other diseases classified elsewhere without 05/29/2016 Yes behavioral disturbance Inactive Problems Resolved Problems  Electronic Signature(s) Signed: 07/24/2016 3:10:50 PM By: Christin Fudge MD, FACS Entered By: Christin Fudge on 07/24/2016 15:10:49 [name] ([id number]) -------------------------------------------------------------------------------- Progress Note Details Patient Name: [name], MCKILLOP. Date of Service: 07/24/2016 2:30 PM Medical Record Number: [id number] Patient Account Number: [id number] Date of Birth/Sex: [birth year] (82 y.o. Male) Treating RN: Ahmed Prima Primary Care Provider: [name] Other Clinician: Referring Provider: [name] Treating Provider/Extender: [name] in Treatment: 8 Subjective Chief Complaint Information obtained from Patient Patient is at the clinic for treatment of an open pressure ulcer to the left ankle which she's had for  about 5 weeks History of Present Illness (HPI) The following HPI elements were documented for the patient's wound: Location: left ankle laterally Quality: Patient reports experiencing a dull pain to affected area(s). Severity: Patient states wound are getting worse. Duration: Patient has had the wound for < 5 weeks prior to presenting for treatment Timing: Pain in wound is Intermittent (comes and goes Context: The wound appeared gradually over time Modifying Factors: Other treatment(s) tried include:put on Bactroban and doxycycline Associated Signs and Symptoms: Patient reports having difficulty standing for long periods. 82 year old gentleman who is known to have a history of dementia but is fairly functional and is able to ambulate has been having a pressure ulcer to the left ankle due to laying in bed for longer than he should. Past history of atrial fibrillation, dementia, hyperlipidemia, hypertension. He's also had some orthopedic related surgeries in the past and was a former smoker and is given up in 1969. Recently seen by his PCP who put him on doxycycline and asked him to apply Bactroban and see [place] at the wound clinic. 06/05/2016 -- had a x-ray of the left ankle which showed no acute bony pathology. Objective Constitutional Pulse regular. Respirations normal and unlabored. Afebrile. Vitals Time Taken: 2:48 PM, Height: 69 in, Weight: 174.8 lbs, BMI: 25.8, Pulse: 58 bpm, Respiratory Rate: 16 breaths/min, Blood Pressure: 128/60 mmHg. [name], TIMMONS. ([id number]) Eyes Nonicteric. Reactive to light. Ears, Nose, Mouth, and Throat Lips, teeth, and gums WNL.Marland Kitchen Moist mucosa without lesions. Neck supple and nontender. No palpable supraclavicular or cervical adenopathy. Normal sized without goiter. Respiratory WNL. No retractions.. Breath sounds WNL, No rubs, rales, rhonchi, or wheeze.. Cardiovascular Heart rhythm and rate regular, no murmur or gallop.. Pedal Pulses WNL. No clubbing,  cyanosis or edema. Chest Breasts symmetical and no nipple discharge.. Breast tissue WNL, no masses, lumps, or tenderness.. Lymphatic No adneopathy. No adenopathy. No adenopathy. Musculoskeletal Adexa without tenderness or enlargement.. Digits and nails w/o clubbing, cyanosis, infection, petechiae, ischemia, or inflammatory conditions.Marland Kitchen Psychiatric Judgement and insight Intact.. No evidence of depression, anxiety, or agitation.. General Notes: the wound had some surrounding eschar and some subacute and his debris which I sharply removed with the forceps and scissors and overall there has been good improvement. Integumentary (Hair, Skin) No suspicious lesions. No crepitus or fluctuance. No peri-wound warmth or erythema. No masses.. Wound #1 status is Open. Original cause of wound was Pressure Injury. The wound is located on the Left,Lateral Malleolus. The wound measures 0.2cm length x 0.2cm width x 0.1cm depth; 0.031cm^2 area and 0.003cm^3 volume. There is Fat Layer (Subcutaneous Tissue) Exposed exposed. There is no tunneling or undermining noted. There is a large amount of serous drainage noted. The wound margin is distinct with the outline attached to the wound base. There is no granulation within the wound bed. There is a large (67- 100%) amount of necrotic tissue within the wound bed  including Eschar and Adherent Slough. The periwound skin appearance exhibited: Erythema. The surrounding wound skin color is noted with erythema which is circumferential. Periwound temperature was noted as No Abnormality. The periwound has tenderness on palpation. Assessment [name], BOWEN ([id number]) Active Problems ICD-10 [phone] - Pressure ulcer of left ankle, stage 2 F02.80 - Dementia in other diseases classified elsewhere without behavioral disturbance Procedures Wound #1 Wound #1 is a Pressure Ulcer located on the Left,Lateral Malleolus . There was a Skin/Subcutaneous Tissue Debridement  ([id number]) debridement with total area of 0.04 sq cm performed by Christin Fudge, MD. with the following instrument(s): Forceps and Scissors to remove Viable and Non-Viable tissue/material including Exudate, Fibrin/Slough, and Subcutaneous after achieving pain control using Lidocaine 4% Topical Solution. A time out was conducted at 14:54, prior to the start of the procedure. A Minimum amount of bleeding was controlled with Pressure. The procedure was tolerated well with a pain level of 0 throughout and a pain level of 0 following the procedure. Post Debridement Measurements: 0.4cm length x 0.3cm width x 0.2cm depth; 0.019cm^3 volume. Post debridement Stage noted as Category/Stage II. Character of Wound/Ulcer Post Debridement requires further debridement. Severity of Tissue Post Debridement is: Fat layer exposed. Post procedure Diagnosis Wound #1: Same as Pre-Procedure Plan Wound Cleansing: Wound #1 Left,Lateral Malleolus: Clean wound with Normal Saline. May Shower, gently pat wound dry prior to applying new dressing. Anesthetic: Wound #1 Left,Lateral Malleolus: Topical Lidocaine 4% cream applied to wound bed prior to debridement - for clinic use Skin Barriers/Peri-Wound Care: Wound #1 Left,Lateral Malleolus: Skin Prep Primary Wound Dressing: Wound #1 Left,Lateral Malleolus: Iodosorb Ointment Secondary Dressing: Wound #1 Left,Lateral Malleolus: Dry Gauze Boardered Foam Dressing ROHAN, JUENGER ([id number]) Follow-up Appointments: Wound #1 Left,Lateral Malleolus: Return Appointment in 1 week. Edema Control: Wound #1 Left,Lateral Malleolus: Elevate legs to the level of the heart and pump ankles as often as possible Off-Loading: Wound #1 Left,Lateral Malleolus: Turn and reposition every 2 hours Other: - keep pressure off of affected area Additional Orders / Instructions: Wound #1 Left,Lateral Malleolus: Increase protein intake. Medications-please add to medication  list.: Wound #1 Left,Lateral Malleolus: Other: - Vitamin A, Vitamin C, Zinc, MVI I have recommended: 1. Iodosorb ointment to be applied daily, and a protective bordered foam. 2. Off-Loading has been discussed in great detail 3. adequate protein, vitamin A, vitamin C and zinc 4. Regular visits to the wound center Electronic Signature(s) Signed: 07/24/2016 3:13:16 PM By: Christin Fudge MD, FACS Entered By: Christin Fudge on 07/24/2016 15:13:15 [name] ([id number]) -------------------------------------------------------------------------------- SuperBill Details Patient Name: KOI, ZANGARA. Date of Service: 07/24/2016 Medical Record Number: [id number] Patient Account Number: [id number] Date of Birth/Sex: 11-16-1934 (82 y.o. Male) Treating RN: Ahmed Prima Primary Care Provider: [name] Other Clinician: Referring Provider: [name] Treating Provider/Extender: Christin Fudge Service Line: Outpatient Weeks in Treatment: 8 Diagnosis Coding ICD-10 Codes Code Description [phone] Pressure ulcer of left ankle, stage 2 F02.80 Dementia in other diseases classified elsewhere without behavioral disturbance Facility Procedures CPT4: Description Modifier Quantity Code [id number] 11042 - DEB SUBQ TISSUE 20 SQ CM/< 1 ICD-10 Description Diagnosis L89.522 Pressure ulcer of left ankle, stage 2 F02.80 Dementia in other diseases classified elsewhere without behavioral disturbance Physician Procedures CPT4: Description Modifier Quantity Code [id number] - WC PHYS SUBQ TISS 20 SQ CM 1 ICD-10 Description Diagnosis L89.522 Pressure ulcer of left ankle, stage 2 F02.80 Dementia in other diseases classified elsewhere without behavioral disturbance Electronic Signature(s) Signed: 07/24/2016 3:13:24 PM By: Christin Fudge MD, FACS  Entered By: Christin Fudge on 07/24/2016 15:13:24

## 2016-07-28 ENCOUNTER — Ambulatory Visit: Payer: PPO | Admitting: Physical Therapy

## 2016-07-28 DIAGNOSIS — L89520 Pressure ulcer of left ankle, unstageable: Secondary | ICD-10-CM | POA: Diagnosis not present

## 2016-07-29 ENCOUNTER — Ambulatory Visit: Payer: PPO | Admitting: Physical Therapy

## 2016-07-29 DIAGNOSIS — M6281 Muscle weakness (generalized): Secondary | ICD-10-CM | POA: Diagnosis not present

## 2016-07-29 DIAGNOSIS — R296 Repeated falls: Secondary | ICD-10-CM

## 2016-07-29 DIAGNOSIS — R293 Abnormal posture: Secondary | ICD-10-CM

## 2016-07-29 DIAGNOSIS — R2689 Other abnormalities of gait and mobility: Secondary | ICD-10-CM

## 2016-07-29 NOTE — Therapy (Signed)
[place] [address] [place], [place], [address] Phone: [phone]   Fax:  [phone]  Physical Therapy Treatment  Patient Details  Name: [name] MRN: [id number] Date of Birth: 12/30/1934 Referring Provider: Jennings Books, MD  Encounter Date: 07/29/2016      PT End of Session - 07/29/16 1721    Visit Number 13   Number of Visits 16   Date for PT Re-Evaluation 08/06/16   Authorization - Visit Number 13   Authorization - Number of Visits 18   PT Start Time 0932   PT Stop Time 1344   PT Time Calculation (min) 50 min   Equipment Utilized During Treatment Gait belt   Activity Tolerance Patient tolerated treatment well   Behavior During Therapy [facility] for tasks assessed/performed      Past Medical History:  Diagnosis Date  . Absolute anemia 09/11/2014  . Anxiety   . Atrial fibrillation ([place]) 01/23/2014  . BP (high blood pressure) 11/07/2013  . Carotid artery disease ([place]) 09/11/2014  . Complete rotator cuff rupture of left shoulder 12/30/2013  . Dementia 09/11/2014  . Depression   . Heart murmur   . HLD (hyperlipidemia) 01/23/2014  . Hypertension   . Obstructive apnea 11/07/2013  . Personal history of other diseases of the circulatory system 01/23/2014  . Sleep apnea   . Teratoma, malignant ([place])    lower back tumor    Past Surgical History:  Procedure Laterality Date  . Back Surgery- Tumor Removal    . SHOULDER SURGERY Right 2007    There were no vitals filed for this visit.      Subjective Assessment - 07/29/16 1717    Subjective Pt. and wife report better carryover between sessions this week with patient utilizing better posture with standing and walking.    Patient is accompained by: Family member   Pertinent History Hx of left knee pain, low back pain, shoulder pain. Hx of falls, dementia, and teretoma of the spine   Limitations Sitting;Lifting;Standing;Walking;House hold activities   Diagnostic tests BERG   Patient Stated Goals get stronger, pick up legs better when walking, getting up easier and faster   Currently in Pain? No/denies      Neuro: UE LSVT inspired arms seated  Hands on knees bring out into abduction with palms forward, raise arms and then return to hands on knees. 2x10. Utilized to open patient's posture and increase trunk and UE ROM.   Reaching arm across body turning head to look at arm and twisting trunk. Return to hands on knees. 2x10 both sides. Increase patient trunk ROM for improved body mechanics.   Reaching hands to floor and then into air with trunk extension Large steps with lines to guide step length 6x length of ladder (skipping every other ladder step) Reaching for cone in tandem stance 2x6 cones    TherEx 3lb flexion and extension with trunk extension LE extension at // bars Pt. Reports feeling stretch, needed frequent cues for upright posture Side stepping on floor ladder 4x with SPC and good upright posture and control Heel raises 10x Toe raises 10x  Picking up cone from ground  Standing marches at // bars with focus on upright posture Stepping over boards in hallway with no LOB High knee stepping 67ft w SPC Big steps 57ft in hallway w [place]   Pt. Response to medical necessity: patient has demonstrated improved strength and balance but continues to have deficits affecting activities of daily living,  ambulatory abilities and posture. Patient will benefit from continued skilled physical therapy to improve safe ambulation and quality of life.          PT Long Term Goals - 07/09/16 1703      PT LONG TERM GOAL #1   Title Patient will increase score on Berg Balance to 49/56 for decreased fall risk.    Baseline 3/7: 50/56 ; 2/8: 44/56   Time 4   Period Weeks   Status Achieved     PT LONG TERM GOAL #2   Title Patient will have gross 4/5 bilateral LE strength to increase ability to perform functional activities.    Baseline Gross LLE 4-/5 knee flexion  3+/5, RLE 4/5   Time 4   Period Weeks   Status On-going     PT LONG TERM GOAL #3   Title Patient will ambulate 5 minutes with a cane without LOB to increase community mobility.     Baseline Patient able to walk 5 minutes with cane but with poor posture.    Time 4   Period Weeks   Status Achieved     PT LONG TERM GOAL #4   Title Patient will perform 10 sit to stands without use of hands or LOB to demonstrate improved capacity for functional activities.    Baseline Patient can perform 10 sit to stands with no hands with decreased eccentric control with fatigue.    Time 4   Period Weeks   Status Achieved     PT LONG TERM GOAL #5   Title Patient will have upright posture for 10 minutes to allow for better body mechanics and decreased back pain.    Baseline Patient requires cueing ~ every 2 minutes   Time 4   Period Weeks   Status On-going     Additional Long Term Goals   Additional Long Term Goals Yes     PT LONG TERM GOAL #6   Title Patient will not have SI/back pain while ambulating to allow for increased community mobility.    Baseline Patient has occasional 5/10 back pain while ambulating   Time 4   Period Weeks   Status On-going     PT LONG TERM GOAL #7   Title Patient will perform 10 sit to stands from surface lower than PT room chairs to increase independence in home setting due to low height of furniture at home.    Baseline Patient can perform 10 sit to stands with fatigue from chair height, able to perform 1 with hands on knees from lowered surface.    Time 4   Period Weeks   Status New     PT LONG TERM GOAL #8   Title Patient will score a 52/56 on Berg to decrease fall risk to minimal and increase single limb abilities to improve carryover to household activities such as stepping into shower.    Baseline 3/7: 50/56, extreme difficulty stepping into shower   Time 4   Period Weeks   Status New             Plan - 07/29/16 1726    Clinical Impression  Statement Patient had good postural control throughout session requiring less frequent cueing with standing posture but continues to require some for ambulation when fatigued. Step length continues to be limited and improved with verbal cueing and post intervention but lacks carryover for longer duration. Pt. performed side stepping with cane for multiple trials with no LOB and good control. Larger  steps were performed with cane and good balance. Pt. was able to step over 4 planks without LOB and only occasional knicking of plank. Patient will continue to benefit from skilled physical therapy to decrease fall risk, improve gross strength and ROM, control pain and allow patient safe ambulation in community for improved quality of life.    Rehab Potential Fair   Clinical Impairments Affecting Rehab Potential hx of falls, dementia, teratoma, mitral valve polapse, cancer, a fib, L knee injury, L shoulder, back pain   PT Frequency 2x / week   PT Duration 4 weeks   PT Treatment/Interventions ADLs/Self Care Home Management;Aquatic Therapy;Cryotherapy;Electrical Stimulation;Biofeedback;Moist Heat;DME Instruction;Gait training;Stair training;Functional mobility training;Therapeutic activities;Therapeutic exercise;Balance training;Neuromuscular re-education;Patient/family education;Orthotic Fit/Training;Manual techniques;Passive range of motion;Dry needling;Energy conservation   PT Next Visit Plan recert? step length   PT Home Exercise Plan see sheet   Consulted and Agree with Plan of Care Patient;Family member/caregiver   Family Member Consulted wife      Patient will benefit from skilled therapeutic intervention in order to improve the following deficits and impairments:  Abnormal gait, Decreased activity tolerance, Decreased balance, Decreased mobility, Decreased knowledge of use of DME, Decreased endurance, Decreased range of motion, Decreased safety awareness, Decreased skin integrity, Decreased knowledge of  precautions, Decreased strength, Hypomobility, Difficulty walking, Impaired flexibility, Postural dysfunction, Improper body mechanics, Decreased cognition  Visit Diagnosis: Muscle weakness (generalized)  Other abnormalities of gait and mobility  Repeated falls  Abnormal posture     Problem List Patient Active Problem List   Diagnosis Date Noted  . Fall 02/18/2016  . Scalp laceration 02/18/2016  . Splenic laceration 02/18/2016  . Acute blood loss anemia 02/18/2016  . Multiple rib fractures 02/16/2016  . Mild dementia 04/10/2015  . Aggrieved 03/02/2015  . Absolute anemia 09/11/2014  . Carotid artery disease ([place]) 09/11/2014  . Dementia 09/11/2014  . History of repair of rotator cuff 03/02/2014  . Atrial fibrillation with RVR ([place]) 01/23/2014  . Personal history of other diseases of the circulatory system 01/23/2014  . HLD (hyperlipidemia) 01/23/2014  . Complete rotator cuff rupture of left shoulder 12/30/2013  . BP (high blood pressure) 11/07/2013  . Amnesia 11/07/2013  . Obstructive apnea 11/07/2013   Pura Spice, PT, DPT # [id number] Janna Arch, SPT 07/30/2016, 8:39 AM  [place] [facility] [address] [place], [place], [address] Phone: [phone]   Fax:  [phone]  Name: BEAUX WEDEMEYER MRN: [id number] Date of Birth: 06-05-1934

## 2016-07-30 ENCOUNTER — Ambulatory Visit: Payer: PPO | Admitting: Physical Therapy

## 2016-07-31 ENCOUNTER — Encounter: Payer: PPO | Admitting: Surgery

## 2016-07-31 ENCOUNTER — Ambulatory Visit: Payer: PPO | Admitting: Physical Therapy

## 2016-07-31 ENCOUNTER — Encounter: Payer: Self-pay | Admitting: Physical Therapy

## 2016-07-31 DIAGNOSIS — R296 Repeated falls: Secondary | ICD-10-CM

## 2016-07-31 DIAGNOSIS — M6281 Muscle weakness (generalized): Secondary | ICD-10-CM

## 2016-07-31 DIAGNOSIS — R2689 Other abnormalities of gait and mobility: Secondary | ICD-10-CM

## 2016-07-31 DIAGNOSIS — R293 Abnormal posture: Secondary | ICD-10-CM

## 2016-07-31 DIAGNOSIS — L89523 Pressure ulcer of left ankle, stage 3: Secondary | ICD-10-CM | POA: Diagnosis not present

## 2016-07-31 DIAGNOSIS — L89522 Pressure ulcer of left ankle, stage 2: Secondary | ICD-10-CM | POA: Diagnosis not present

## 2016-07-31 NOTE — Therapy (Signed)
[place] [facility] [address] [place], [place], [address] Phone: [phone]   Fax:  [phone]  Physical Therapy Treatment  Patient Details  Name: [name] MRN: [id number] Date of Birth: 14-Jun-1934 Referring Provider: Jennings Books, MD  Encounter Date: 07/31/2016      PT End of Session - 07/31/16 1703    Visit Number 14   Number of Visits 16   Date for PT Re-Evaluation 08/06/16   Authorization - Visit Number 14   Authorization - Number of Visits 18   PT Start Time 3154   PT Stop Time 1344   PT Time Calculation (min) 50 min   Equipment Utilized During Treatment Gait belt   Activity Tolerance Patient tolerated treatment well   Behavior During Therapy [facility] for tasks assessed/performed      Past Medical History:  Diagnosis Date  . Absolute anemia 09/11/2014  . Anxiety   . Atrial fibrillation ([place]) 01/23/2014  . BP (high blood pressure) 11/07/2013  . Carotid artery disease ([place]) 09/11/2014  . Complete rotator cuff rupture of left shoulder 12/30/2013  . Dementia 09/11/2014  . Depression   . Heart murmur   . HLD (hyperlipidemia) 01/23/2014  . Hypertension   . Obstructive apnea 11/07/2013  . Personal history of other diseases of the circulatory system 01/23/2014  . Sleep apnea   . Teratoma, malignant ([place])    lower back tumor    Past Surgical History:  Procedure Laterality Date  . Back Surgery- Tumor Removal    . SHOULDER SURGERY Right 2007    There were no vitals filed for this visit.      Subjective Assessment - 07/31/16 1659    Subjective Patient unable to bring trumpet in for practice standing and playing because it needed cleaning. Patient has had no falls or LOB since last session.    Patient is accompained by: Family member   Pertinent History Hx of left knee pain, low back pain, shoulder pain. Hx of falls, dementia, and teretoma of the spine   Limitations Sitting;Lifting;Standing;Walking;House hold activities    Diagnostic tests BERG   Patient Stated Goals get stronger, pick up legs better when walking, getting up easier and faster   Currently in Pain? No/denies     TherEx Hand claps in front and back 8x20 with good postural 3lb db chest raise marching 2x60 seconds Side stepping in // bars 6x with decreased UE assistance Forward steps large steps-better in // bars 6x   Backwards stepping in // bars Ambulating 48 ft with good control and SPC Scapular retractions 3x10 for 5 second holds   Neuro Obstacle course 2x. Stepping over two 6" steps, stepping over 3" step, single leg stance knock ball off cone each leg 2x, step over 3" step, single leg stance knock ball off cone each leg 2x. Large steps zigzagging to cones.  Cross body standing, reciprocating UE and LE with step back motion Sitting UE and LE reciprocating motions   Pt. Response to medical necessity: patient has demonstrated improved strength and balance but continues to have deficits affecting activities of daily living, ambulatory abilities and posture. Patient will benefit from continued skilled physical therapy to improve safe ambulation and quality of life.        PT Education - 07/31/16 1700    Education provided Yes   Education Details cross body coordination for brain/mental growth   Person(s) Educated Patient   Methods Explanation;Demonstration   Comprehension Verbalized understanding;Returned demonstration  PT Long Term Goals - 07/09/16 1703      PT LONG TERM GOAL #1   Title Patient will increase score on Berg Balance to 49/56 for decreased fall risk.    Baseline 3/7: 50/56 ; 2/8: 44/56   Time 4   Period Weeks   Status Achieved     PT LONG TERM GOAL #2   Title Patient will have gross 4/5 bilateral LE strength to increase ability to perform functional activities.    Baseline Gross LLE 4-/5 knee flexion 3+/5, RLE 4/5   Time 4   Period Weeks   Status On-going     PT LONG TERM GOAL #3   Title  Patient will ambulate 5 minutes with a cane without LOB to increase community mobility.     Baseline Patient able to walk 5 minutes with cane but with poor posture.    Time 4   Period Weeks   Status Achieved     PT LONG TERM GOAL #4   Title Patient will perform 10 sit to stands without use of hands or LOB to demonstrate improved capacity for functional activities.    Baseline Patient can perform 10 sit to stands with no hands with decreased eccentric control with fatigue.    Time 4   Period Weeks   Status Achieved     PT LONG TERM GOAL #5   Title Patient will have upright posture for 10 minutes to allow for better body mechanics and decreased back pain.    Baseline Patient requires cueing ~ every 2 minutes   Time 4   Period Weeks   Status On-going     Additional Long Term Goals   Additional Long Term Goals Yes     PT LONG TERM GOAL #6   Title Patient will not have SI/back pain while ambulating to allow for increased community mobility.    Baseline Patient has occasional 5/10 back pain while ambulating   Time 4   Period Weeks   Status On-going     PT LONG TERM GOAL #7   Title Patient will perform 10 sit to stands from surface lower than PT room chairs to increase independence in home setting due to low height of furniture at home.    Baseline Patient can perform 10 sit to stands with fatigue from chair height, able to perform 1 with hands on knees from lowered surface.    Time 4   Period Weeks   Status New     PT LONG TERM GOAL #8   Title Patient will score a 52/56 on Berg to decrease fall risk to minimal and increase single limb abilities to improve carryover to household activities such as stepping into shower.    Baseline 3/7: 50/56, extreme difficulty stepping into shower   Time 4   Period Weeks   Status New            Plan - 07/31/16 1709    Clinical Impression Statement Patient continues to have difficulty with cross body coordination tasks requiring mod-mad  techniques for exercises that utilize LE and UE reciprocal pattern. Posture continues to improve with frequent utilization of clap forward and backward technique. Pt. had relief of back pain with upright posture and was able to maintain upright posture for longer durations. Patient will continue to benefit from skilled physical therapy to decrease fall risk, improve gross strength and ROM, control pain and allow patient safe ambulation in community for improved quality of life.  Rehab Potential Fair   Clinical Impairments Affecting Rehab Potential hx of falls, dementia, teratoma, mitral valve polapse, cancer, a fib, L knee injury, L shoulder, back pain   PT Frequency 2x / week   PT Duration 4 weeks   PT Treatment/Interventions ADLs/Self Care Home Management;Aquatic Therapy;Cryotherapy;Electrical Stimulation;Biofeedback;Moist Heat;DME Instruction;Gait training;Stair training;Functional mobility training;Therapeutic activities;Therapeutic exercise;Balance training;Neuromuscular re-education;Patient/family education;Orthotic Fit/Training;Manual techniques;Passive range of motion;Dry needling;Energy conservation   PT Next Visit Plan RECERT   PT Home Exercise Plan see sheet   Consulted and Agree with Plan of Care Patient;Family member/caregiver   Family Member Consulted wife      Patient will benefit from skilled therapeutic intervention in order to improve the following deficits and impairments:  Abnormal gait, Decreased activity tolerance, Decreased balance, Decreased mobility, Decreased knowledge of use of DME, Decreased endurance, Decreased range of motion, Decreased safety awareness, Decreased skin integrity, Decreased knowledge of precautions, Decreased strength, Hypomobility, Difficulty walking, Impaired flexibility, Postural dysfunction, Improper body mechanics, Decreased cognition  Visit Diagnosis: Muscle weakness (generalized)  Other abnormalities of gait and mobility  Repeated  falls  Abnormal posture     Problem List Patient Active Problem List   Diagnosis Date Noted  . Fall 02/18/2016  . Scalp laceration 02/18/2016  . Splenic laceration 02/18/2016  . Acute blood loss anemia 02/18/2016  . Multiple rib fractures 02/16/2016  . Mild dementia 04/10/2015  . Aggrieved 03/02/2015  . Absolute anemia 09/11/2014  . Carotid artery disease ([place]) 09/11/2014  . Dementia 09/11/2014  . History of repair of rotator cuff 03/02/2014  . Atrial fibrillation with RVR ([place]) 01/23/2014  . Personal history of other diseases of the circulatory system 01/23/2014  . HLD (hyperlipidemia) 01/23/2014  . Complete rotator cuff rupture of left shoulder 12/30/2013  . BP (high blood pressure) 11/07/2013  . Amnesia 11/07/2013  . Obstructive apnea 11/07/2013   Pura Spice, PT, DPT # [id number], SPT 07/31/2016, 5:22 PM  [place] [facility] [address] [place], [place], [address] Phone: [phone]   Fax:  [phone]  Name: SULAYMAN MANNING MRN: [id number] Date of Birth: [birth year]

## 2016-08-01 NOTE — Progress Notes (Signed)
AHMAN, DUGDALE ([id number]) Visit Report for 07/31/2016 Chief Complaint Document Details Patient Name: [name], [name]. Date of Service: 07/31/2016 2:30 PM Medical Record Number: [id number] Patient Account Number: [id number] Date of Birth/Sex: 04/21/1935 (82 y.o. Male) Treating RN: Montey Hora Primary Care Provider: [name] Other Clinician: Referring Provider: [name] Treating Provider/Extender: [name] in Treatment: 9 Information Obtained from: Patient Chief Complaint Patient is at the clinic for treatment of an open pressure ulcer to the left ankle which she's had for about 5 weeks Electronic Signature(s) Signed: 07/31/2016 3:05:51 PM By: Christin Fudge MD, FACS Entered By: Christin Fudge on 07/31/2016 15:05:50 [name] ([id number]) -------------------------------------------------------------------------------- HPI Details Patient Name: [name], [name]. Date of Service: 07/31/2016 2:30 PM Medical Record Number: [id number] Patient Account Number: [id number] Date of Birth/Sex: [birth year] (82 y.o. Male) Treating RN: Montey Hora Primary Care Provider: [name] Other Clinician: Referring Provider: [name] Treating Provider/Extender: [name] in Treatment: 9 History of Present Illness Location: left ankle laterally Quality: Patient reports experiencing a dull pain to affected area(s). Severity: Patient states wound are getting worse. Duration: Patient has had the wound for < 5 weeks prior to presenting for treatment Timing: Pain in wound is Intermittent (comes and goes Context: The wound appeared gradually over time Modifying Factors: Other treatment(s) tried include:put on Bactroban and doxycycline Associated Signs and Symptoms: Patient reports having difficulty standing for long periods. HPI Description: 82 year old gentleman who is known to have a history of dementia but is fairly functional and is able to  ambulate has been having a pressure ulcer to the left ankle due to laying in bed for longer than he should. Past history of atrial fibrillation, dementia, hyperlipidemia, hypertension. He's also had some orthopedic related surgeries in the past and was a former smoker and is given up in 1969. Recently seen by his PCP who put him on doxycycline and asked him to apply Bactroban and see [place] at the wound clinic. 06/05/2016 -- had a x-ray of the left ankle which showed no acute bony pathology. Electronic Signature(s) Signed: 07/31/2016 3:05:59 PM By: Christin Fudge MD, FACS Entered By: Christin Fudge on 07/31/2016 15:05:59 [name] ([id number]) -------------------------------------------------------------------------------- Physical Exam Details Patient Name: [name], [name]. Date of Service: 07/31/2016 2:30 PM Medical Record Number: [id number] Patient Account Number: [id number] Date of Birth/Sex: 07-13-1934 (82 y.o. Male) Treating RN: Montey Hora Primary Care Provider: [name] Other Clinician: Referring Provider: [name] Treating Provider/Extender: [name] in Treatment: 9 Constitutional . Pulse regular. Respirations normal and unlabored. Afebrile. . Eyes Nonicteric. Reactive to light. Ears, Nose, Mouth, and Throat Lips, teeth, and gums WNL.Marland Kitchen Moist mucosa without lesions. Neck supple and nontender. No palpable supraclavicular or cervical adenopathy. Normal sized without goiter. Respiratory WNL. No retractions.. Breath sounds WNL, No rubs, rales, rhonchi, or wheeze.. Cardiovascular Heart rhythm and rate regular, no murmur or gallop.. Pedal Pulses WNL. No clubbing, cyanosis or edema. Lymphatic No adneopathy. No adenopathy. No adenopathy. Musculoskeletal Adexa without tenderness or enlargement.. Digits and nails w/o clubbing, cyanosis, infection, petechiae, ischemia, or inflammatory conditions.. Integumentary (Hair, Skin) No suspicious lesions. No  crepitus or fluctuance. No peri-wound warmth or erythema. No masses.Marland Kitchen Psychiatric Judgement and insight Intact.. No evidence of depression, anxiety, or agitation.. Notes the wound base is looking clean and the edges are not undermining and no sharp debridement was required today. Electronic Signature(s) Signed: 07/31/2016 3:06:39 PM By: Christin Fudge MD, FACS Entered By: Christin Fudge on 07/31/2016 15:06:38  [name], [name] ([id number]) -------------------------------------------------------------------------------- Physician Orders Details Patient Name: [name], [name]. Date of Service: 07/31/2016 2:30 PM Medical Record Number: [id number] Patient Account Number: [id number] Date of Birth/Sex: [birth year] (82 y.o. Male) Treating RN: Montey Hora Primary Care Provider: [name] Other Clinician: Referring Provider: [name] Treating Provider/Extender: [name] in Treatment: 9 Verbal / Phone Orders: No Diagnosis Coding Wound Cleansing Wound #1 Left,Lateral Malleolus o Clean wound with Normal Saline. o May Shower, gently pat wound dry prior to applying new dressing. Anesthetic Wound #1 Left,Lateral Malleolus o Topical Lidocaine 4% cream applied to wound bed prior to debridement - for clinic use Skin Barriers/Peri-Wound Care Wound #1 Left,Lateral Malleolus o Skin Prep Primary Wound Dressing Wound #1 Left,Lateral Malleolus o Iodosorb Ointment Secondary Dressing Wound #1 Left,Lateral Malleolus o Dry Gauze o Boardered Foam Dressing Follow-up Appointments Wound #1 Left,Lateral Malleolus o Return Appointment in 1 week. Edema Control Wound #1 Left,Lateral Malleolus o Elevate legs to the level of the heart and pump ankles as often as possible Off-Loading Wound #1 Left,Lateral Malleolus o Turn and reposition every 2 hours o Other: - keep pressure off of affected area [name], [name]. ([id number]) Additional Orders /  Instructions Wound #1 Left,Lateral Malleolus o Increase protein intake. Medications-please add to medication list. Wound #1 Left,Lateral Malleolus o Other: - Vitamin A, Vitamin C, Zinc, MVI Electronic Signature(s) Signed: 07/31/2016 3:13:18 PM By: Christin Fudge MD, FACS Entered By: Christin Fudge on 07/31/2016 15:06:57 [name] ([id number]) -------------------------------------------------------------------------------- Problem List Details Patient Name: [name], [name]. Date of Service: 07/31/2016 2:30 PM Medical Record Number: [id number] Patient Account Number: [id number] Date of Birth/Sex: [birth year] (82 y.o. Male) Treating RN: Montey Hora Primary Care Provider: [name] Other Clinician: Referring Provider: [name] Treating Provider/Extender: [name] in Treatment: 9 Active Problems ICD-10 Encounter Code Description Active Date Diagnosis [phone] Pressure ulcer of left ankle, stage 2 05/29/2016 Yes F02.80 Dementia in other diseases classified elsewhere without 05/29/2016 Yes behavioral disturbance Inactive Problems Resolved Problems Electronic Signature(s) Signed: 07/31/2016 3:05:37 PM By: Christin Fudge MD, FACS Entered By: Christin Fudge on 07/31/2016 15:05:36 [name] ([id number]) -------------------------------------------------------------------------------- Progress Note Details Patient Name: [name], [name]. Date of Service: 07/31/2016 2:30 PM Medical Record Number: [id number] Patient Account Number: [id number] Date of Birth/Sex: [birth year] (82 y.o. Male) Treating RN: Montey Hora Primary Care Provider: [name] Other Clinician: Referring Provider: [name] Treating Provider/Extender: [name] in Treatment: 9 Subjective Chief Complaint Information obtained from Patient Patient is at the clinic for treatment of an open pressure ulcer to the left ankle which she's had for about  5 weeks History of Present Illness (HPI) The following HPI elements were documented for the patient's wound: Location: left ankle laterally Quality: Patient reports experiencing a dull pain to affected area(s). Severity: Patient states wound are getting worse. Duration: Patient has had the wound for < 5 weeks prior to presenting for treatment Timing: Pain in wound is Intermittent (comes and goes Context: The wound appeared gradually over time Modifying Factors: Other treatment(s) tried include:put on Bactroban and doxycycline Associated Signs and Symptoms: Patient reports having difficulty standing for long periods. 82 year old gentleman who is known to have a history of dementia but is fairly functional and is able to ambulate has been having a pressure ulcer to the left ankle due to laying in bed for longer than he should. Past history of atrial fibrillation, dementia, hyperlipidemia, hypertension. He's also had some orthopedic related surgeries in the  past and was a former smoker and is given up in 1969. Recently seen by his PCP who put him on doxycycline and asked him to apply Bactroban and see [place] at the wound clinic. 06/05/2016 -- had a x-ray of the left ankle which showed no acute bony pathology. Objective Constitutional Pulse regular. Respirations normal and unlabored. Afebrile. Vitals Time Taken: 2:33 PM, Height: 69 in, Weight: 174.8 lbs, BMI: 25.8, Temperature: 97.6 F, Pulse: 58 bpm, Respiratory Rate: 18 breaths/min, Blood Pressure: 124/55 mmHg. [name], [name]. ([id number]) Eyes Nonicteric. Reactive to light. Ears, Nose, Mouth, and Throat Lips, teeth, and gums WNL.Marland Kitchen Moist mucosa without lesions. Neck supple and nontender. No palpable supraclavicular or cervical adenopathy. Normal sized without goiter. Respiratory WNL. No retractions.. Breath sounds WNL, No rubs, rales, rhonchi, or wheeze.. Cardiovascular Heart rhythm and rate regular, no murmur or gallop.. Pedal Pulses  WNL. No clubbing, cyanosis or edema. Lymphatic No adneopathy. No adenopathy. No adenopathy. Musculoskeletal Adexa without tenderness or enlargement.. Digits and nails w/o clubbing, cyanosis, infection, petechiae, ischemia, or inflammatory conditions.Marland Kitchen Psychiatric Judgement and insight Intact.. No evidence of depression, anxiety, or agitation.. General Notes: the wound base is looking clean and the edges are not undermining and no sharp debridement was required today. Integumentary (Hair, Skin) No suspicious lesions. No crepitus or fluctuance. No peri-wound warmth or erythema. No masses.. Wound #1 status is Open. Original cause of wound was Pressure Injury. The wound is located on the Left,Lateral Malleolus. The wound measures 0.4cm length x 0.4cm width x 0.3cm depth; 0.126cm^2 area and 0.038cm^3 volume. There is Fat Layer (Subcutaneous Tissue) Exposed exposed. There is no tunneling or undermining noted. There is a large amount of serous drainage noted. The wound margin is distinct with the outline attached to the wound base. There is large (67-100%) pink granulation within the wound bed. There is a small (1-33%) amount of necrotic tissue within the wound bed including Eschar and Adherent Slough. The periwound skin appearance exhibited: Erythema. The surrounding wound skin color is noted with erythema which is circumferential. Periwound temperature was noted as No Abnormality. The periwound has tenderness on palpation. Assessment Active Problems ICD-10 [name], [name] ([id number]) L89.522 - Pressure ulcer of left ankle, stage 2 F02.80 - Dementia in other diseases classified elsewhere without behavioral disturbance Plan Wound Cleansing: Wound #1 Left,Lateral Malleolus: Clean wound with Normal Saline. May Shower, gently pat wound dry prior to applying new dressing. Anesthetic: Wound #1 Left,Lateral Malleolus: Topical Lidocaine 4% cream applied to wound bed prior to debridement - for  clinic use Skin Barriers/Peri-Wound Care: Wound #1 Left,Lateral Malleolus: Skin Prep Primary Wound Dressing: Wound #1 Left,Lateral Malleolus: Iodosorb Ointment Secondary Dressing: Wound #1 Left,Lateral Malleolus: Dry Gauze Boardered Foam Dressing Follow-up Appointments: Wound #1 Left,Lateral Malleolus: Return Appointment in 1 week. Edema Control: Wound #1 Left,Lateral Malleolus: Elevate legs to the level of the heart and pump ankles as often as possible Off-Loading: Wound #1 Left,Lateral Malleolus: Turn and reposition every 2 hours Other: - keep pressure off of affected area Additional Orders / Instructions: Wound #1 Left,Lateral Malleolus: Increase protein intake. Medications-please add to medication list.: Wound #1 Left,Lateral Malleolus: Other: - Vitamin A, Vitamin C, Zinc, MVI [name], Jared M. ([id number]) I have recommended: 1. Iodosorb ointment to be applied daily, 1/4 inch packing strip and a protective bordered foam. 2. Off-Loading has been discussed in great detail 3. adequate protein, vitamin A, vitamin C and zinc 4. Regular visits to the wound center. Electronic Signature(s) Signed: 07/31/2016 3:08:21 PM By: Christin Fudge MD, FACS  Entered By: Christin Fudge on 07/31/2016 15:08:21 [name] ([id number]) -------------------------------------------------------------------------------- SuperBill Details Patient Name: [name], [name]. Date of Service: 07/31/2016 Medical Record Number: [id number] Patient Account Number: [id number] Date of Birth/Sex: 04/12/1935 (83 y.o. Male) Treating RN: Montey Hora Primary Care Provider: [name] Other Clinician: Referring Provider: [name] Treating Provider/Extender: [name] in Treatment: 9 Diagnosis Coding ICD-10 Codes Code Description [phone] Pressure ulcer of left ankle, stage 2 F02.80 Dementia in other diseases classified elsewhere without behavioral disturbance Facility  Procedures CPT4 Code: [id number] Description: 99213 - WOUND CARE VISIT-LEV 3 EST PT Modifier: Quantity: 1 Physician Procedures CPT4: Description Modifier Quantity Code [phone] 99213 - WC PHYS LEVEL 3 - EST PT 1 ICD-10 Description Diagnosis L89.522 Pressure ulcer of left ankle, stage 2 F02.80 Dementia in other diseases classified elsewhere without behavioral disturbance Electronic Signature(s) Signed: 07/31/2016 3:08:33 PM By: Christin Fudge MD, FACS Entered By: Christin Fudge on 07/31/2016 15:08:32

## 2016-08-01 NOTE — Progress Notes (Signed)
[name] ([id number]) Visit Report for 07/31/2016 Arrival Information Details Patient Name: [name], [name]. Date of Service: 07/31/2016 2:30 PM Medical Record Number: [id number] Patient Account Number: [id number] Date of Birth/Sex: [birth year] (82 y.o. Male) Treating RN: [name] Primary Care [name]: [name] Other Clinician: Referring [name]: [name] Treating Renel Ende/Extender: [name] in Treatment: 9 Visit Information History Since Last Visit Added or deleted any medications: No Patient Arrived: [name] Any new allergies or adverse reactions: No Arrival Time: 14:32 Had a fall or experienced change in No Accompanied By: spouse activities of daily living that Jared affect Transfer Assistance: None risk of falls: Patient Identification Verified: Yes Signs or symptoms of abuse/neglect since last No Secondary Verification Process Completed: Yes visito Patient Requires Transmission-Based No Hospitalized since last visit: No Precautions: Has Dressing in Place as Prescribed: Yes Patient Has Alerts: Yes Pain Present Now: No Patient Alerts: ASA Electronic Signature(s) Signed: 07/31/2016 5:13:15 PM By: [name] Entered By: [name] on 07/31/2016 14:32:38 [name] ([id number]) -------------------------------------------------------------------------------- Clinic Level of Care Assessment Details Patient Name: [name]. Date of Service: 07/31/2016 2:30 PM Medical Record Number: [id number] Patient Account Number: [id number] Date of Birth/Sex: [name] (82 y.o. Male) Treating RN: [name] Primary Care Oronde Hallenbeck: [name] Other Clinician: Referring Meher Kucinski: [name] Treating Karri Kallenbach/Extender: [name] in Treatment: 9 Clinic Level of Care Assessment Items TOOL 4 Quantity Score []  - Use when only an EandM is performed on FOLLOW-UP visit 0 ASSESSMENTS - Nursing Assessment /  Reassessment X - Reassessment of Co-morbidities (includes updates in patient status) 1 10 X - Reassessment of Adherence to Treatment Plan 1 5 ASSESSMENTS - Wound and Skin Assessment / Reassessment X - Simple Wound Assessment / Reassessment - one wound 1 5 []  - Complex Wound Assessment / Reassessment - multiple wounds 0 []  - Dermatologic / Skin Assessment (not related to wound area) 0 ASSESSMENTS - Focused Assessment []  - Circumferential Edema Measurements - multi extremities 0 []  - Nutritional Assessment / Counseling / Intervention 0 X - Lower Extremity Assessment (monofilament, tuning fork, pulses) 1 5 []  - Peripheral Arterial Disease Assessment (using hand held doppler) 0 ASSESSMENTS - Ostomy and/or Continence Assessment and Care []  - Incontinence Assessment and Management 0 []  - Ostomy Care Assessment and Management (repouching, etc.) 0 PROCESS - Coordination of Care X - Simple Patient / Family Education for ongoing care 1 15 []  - Complex (extensive) Patient / Family Education for ongoing care 0 []  - Staff obtains Programmer, systems, Records, Test Results / Process Orders 0 []  - Staff telephones HHA, Nursing Homes / Clarify orders / etc 0 []  - Routine Transfer to another Facility (non-emergent condition) 0 JRU, PENSE. ([id number]) []  - Routine Hospital Admission (non-emergent condition) 0 []  - New Admissions / Biomedical engineer / Ordering NPWT, Apligraf, etc. 0 []  - Emergency Hospital Admission (emergent condition) 0 X - Simple Discharge Coordination 1 10 []  - Complex (extensive) Discharge Coordination 0 PROCESS - Special Needs []  - Pediatric / Minor Patient Management 0 []  - Isolation Patient Management 0 []  - Hearing / Language / Visual special needs 0 []  - Assessment of Community assistance (transportation, D/C planning, etc.) 0 []  - Additional assistance / Altered mentation 0 []  - Support Surface(s) Assessment (bed, cushion, seat, etc.) 0 INTERVENTIONS - Wound Cleansing /  Measurement X - Simple Wound Cleansing - one wound 1 5 []  - Complex Wound Cleansing - multiple wounds 0 X - Wound Imaging (photographs - any  number of wounds) 1 5 []  - Wound Tracing (instead of photographs) 0 X - Simple Wound Measurement - one wound 1 5 []  - Complex Wound Measurement - multiple wounds 0 INTERVENTIONS - Wound Dressings X - Small Wound Dressing one or multiple wounds 1 10 []  - Medium Wound Dressing one or multiple wounds 0 []  - Large Wound Dressing one or multiple wounds 0 []  - Application of Medications - topical 0 []  - Application of Medications - injection 0 INTERVENTIONS - Miscellaneous []  - External ear exam 0 ERSKIN, ZINDA. ([id number]) []  - Specimen Collection (cultures, biopsies, blood, body fluids, etc.) 0 []  - Specimen(s) / Culture(s) sent or taken to Lab for analysis 0 []  - Patient Transfer (multiple staff / [name] / Similar devices) 0 []  - Simple Staple / Suture removal (25 or less) 0 []  - Complex Staple / Suture removal (26 or more) 0 []  - Hypo / Hyperglycemic Management (close monitor of Blood Glucose) 0 []  - Ankle / Brachial Index (ABI) - do not check if billed separately 0 X - Vital Signs 1 5 Has the patient been seen at the hospital within the last three years: Yes Total Score: 80 Level Of Care: New/Established - Level 3 Electronic Signature(s) Signed: 07/31/2016 5:13:15 PM By: [name] Entered By: [name] on 07/31/2016 14:51:08 [name] ([id number]) -------------------------------------------------------------------------------- Encounter Discharge Information Details Patient Name: [name]. Date of Service: 07/31/2016 2:30 PM Medical Record Number: [id number] Patient Account Number: [id number] Date of Birth/Sex: [birth year] (82 y.o. Male) Treating RN: [name] Primary Care Wayland Baik: [name] Other Clinician: Referring Pamla Pangle: [name] Treating Gursimran Litaker/Extender: [name] in  Treatment: 9 Encounter Discharge Information Items Discharge Pain Level: 0 Discharge Condition: Stable Ambulatory Status: Cane Discharge Destination: Home Transportation: Private Auto Accompanied By: spouse Schedule Follow-up Appointment: Yes Medication Reconciliation completed No and provided to Patient/Care Romari Gasparro: Provided on Clinical Summary of Care: 07/31/2016 Form Type Recipient Paper Patient ET Electronic Signature(s) Signed: 07/31/2016 3:02:02 PM By: Ruthine Dose Entered By: Ruthine Dose on 07/31/2016 15:02:01 [name] ([id number]) -------------------------------------------------------------------------------- Lower Extremity Assessment Details Patient Name: KIMI, KROFT. Date of Service: 07/31/2016 2:30 PM Medical Record Number: [id number] Patient Account Number: [id number] Date of Birth/Sex: [birth year] (82 y.o. Male) Treating RN: [name] Primary Care Nyasha Rahilly: [name] Other Clinician: Referring Calden Dorsey: [name] Treating Ranveer Wahlstrom/Extender: [name] in Treatment: 9 Edema Assessment Assessed: [Left: No] Patrice Paradise: No] Edema: [Left: N] [Right: o] Vascular Assessment Pulses: Dorsalis Pedis Palpable: [Left:Yes] Posterior Tibial Extremity colors, hair growth, and conditions: Extremity Color: [Left:Normal] Hair Growth on Extremity: [Left:Yes] Temperature of Extremity: [Left:Warm] Capillary Refill: [Left:< 3 seconds] Electronic Signature(s) Signed: 07/31/2016 5:13:15 PM By: [name] Entered By: [name] on 07/31/2016 14:48:00 [name] ([id number]) -------------------------------------------------------------------------------- Multi Wound Chart Details Patient Name: [name]. Date of Service: 07/31/2016 2:30 PM Medical Record Number: [id number] Patient Account Number: [id number] Date of Birth/Sex: [birth year] (82 y.o. Male) Treating RN: [name] Primary Care Bulah Lurie: [name] Other Clinician: Referring Illeana Edick: [name] Treating Normagene Harvie/Extender: [name] in Treatment: 9 Vital Signs Height(in): 69 Pulse(bpm): 58 Weight(lbs): 174.8 Blood Pressure 124/55 (mmHg): Body Mass Index(BMI): 26 Temperature(F): 97.6 Respiratory Rate 18 (breaths/min): Photos: [1:No Photos] [N/A:N/A] Wound Location: [1:Left Malleolus - Lateral] [N/A:N/A] Wounding Event: [1:Pressure Injury] [N/A:N/A] Primary Etiology: [1:Pressure Ulcer] [N/A:N/A] Comorbid History: [1:Cataracts, Anemia, Sleep Apnea, Arrhythmia, Hypertension, Dementia] [N/A:N/A] Date Acquired: [1:05/01/2016] [N/A:N/A] Weeks of Treatment: [1:9] [N/A:N/A] Wound Status: [1:Open] [  N/A:N/A] Measurements L x W x D 0.4x0.4x0.3 [N/A:N/A] (cm) Area (cm) : [1:0.126] [N/A:N/A] Volume (cm) : [1:0.038] [N/A:N/A] % Reduction in Area: [1:35.70%] [N/A:N/A] % Reduction in Volume: -90.00% [N/A:N/A] Classification: [1:Category/Stage II] [N/A:N/A] Exudate Amount: [1:Large] [N/A:N/A] Exudate Type: [1:Serous] [N/A:N/A] Exudate Color: [1:amber] [N/A:N/A] Wound Margin: [1:Distinct, outline attached] [N/A:N/A] Granulation Amount: [1:Large (67-100%)] [N/A:N/A] Granulation Quality: [1:Pink] [N/A:N/A] Necrotic Amount: [1:Small (1-33%)] [N/A:N/A] Necrotic Tissue: [1:Eschar, Adherent Slough] [N/A:N/A] Exposed Structures: [1:Fat Layer (Subcutaneous Tissue) Exposed: Yes] [N/A:N/A] Epithelialization: [1:None] [N/A:N/A] Periwound Skin Texture: No Abnormalities Noted [N/A:N/A] Periwound Skin No Abnormalities Noted N/A N/A Moisture: Periwound Skin Color: Erythema: Yes N/A N/A Erythema Location: Circumferential N/A N/A Temperature: No Abnormality N/A N/A Tenderness on Yes N/A N/A Palpation: Wound Preparation: Ulcer Cleansing: N/A N/A Rinsed/Irrigated with Saline Topical Anesthetic Applied: Other: lidocaine 4% Treatment Notes Electronic Signature(s) Signed: 07/31/2016 3:05:42 PM By: Christin Fudge MD,  FACS Entered By: Christin Fudge on 07/31/2016 15:05:42 [name], [name] ([id number]) -------------------------------------------------------------------------------- [place] Details Patient Name: [name], [name]. Date of Service: 07/31/2016 2:30 PM Medical Record Number: [id number] Patient Account Number: [id number] Date of Birth/Sex: [birth year] (82 y.o. Male) Treating RN: [name] Primary Care Jazmin Vensel: [name] Other Clinician: Referring Slayden Mennenga: [name] Treating Virgilio Broadhead/Extender: [name] in Treatment: 9 Active Inactive ` Abuse / Safety / Falls / Self Care Management Nursing Diagnoses: Potential for falls Goals: Patient will remain injury free Date Initiated: 05/29/2016 Target Resolution Date: 08/02/2016 Goal Status: Active Interventions: Assess fall risk on admission and as needed Assess impairment of mobility on admission and as needed per policy Notes: ` Nutrition Nursing Diagnoses: Imbalanced nutrition Goals: Patient/caregiver agrees to and verbalizes understanding of need to use nutritional supplements and/or vitamins as prescribed Date Initiated: 05/29/2016 Target Resolution Date: 08/02/2016 Goal Status: Active Interventions: Assess patient nutrition upon admission and as needed per policy Notes: ` Orientation to the Wound Care Program Nursing Diagnoses: EMAN, MORIMOTO ([id number]) Knowledge deficit related to the wound healing center program Goals: Patient/caregiver will verbalize understanding of the [facility] Program Date Initiated: 05/29/2016 Target Resolution Date: 06/14/2016 Goal Status: Active Interventions: Provide education on orientation to the wound center Notes: ` Pain, Acute or Chronic Nursing Diagnoses: Pain, acute or chronic: actual or potential Potential alteration in comfort, pain Goals: Patient will verbalize adequate pain control and receive pain control interventions  during procedures as needed Date Initiated: 05/29/2016 Target Resolution Date: 08/02/2016 Goal Status: Active Interventions: Complete pain assessment as per visit requirements Notes: ` Wound/Skin Impairment Nursing Diagnoses: Impaired tissue integrity Knowledge deficit related to ulceration/compromised skin integrity Goals: Ulcer/skin breakdown will have a volume reduction of 80% by week 12 Date Initiated: 05/29/2016 Target Resolution Date: 08/02/2016 Goal Status: Active Interventions: Assess patient/caregiver ability to perform ulcer/skin care regimen upon admission and as needed Assess ulceration(s) every visit Notes: [name], [name] ([id number]) Electronic Signature(s) Signed: 07/31/2016 5:13:15 PM By: [name] Entered By: [name] on 07/31/2016 14:48:20 [name] ([id number]) -------------------------------------------------------------------------------- Pain Assessment Details Patient Name: [name]. Date of Service: 07/31/2016 2:30 PM Medical Record Number: [id number] Patient Account Number: [id number] Date of Birth/Sex: [birth year] (82 y.o. Male) Treating RN: [name] Primary Care Laqueta Bonaventura: [name] Other Clinician: Referring Johnella Crumm: [name] Treating Krystyl Cannell/Extender: [name] in Treatment: 9 Active Problems Location of Pain Severity and Description of Pain Patient Has Paino No Site Locations Pain Management and Medication Current Pain Management: Electronic Signature(s) Signed: 07/31/2016 5:13:15 PM By: [name] Entered By: [name] on  07/31/2016 14:32:47 [name], [name] ([id number]) -------------------------------------------------------------------------------- Patient/Caregiver Education Details Patient Name: [name], [name]. Date of Service: 07/31/2016 2:30 PM Medical Record Number: [id number] Patient Account Number: [id number] Date of Birth/Gender: [birth year] (82 y.o. Male) Treating  RN: [name] Primary Care Physician: [name] Other Clinician: Referring Physician: Lynett Ho Treating Physician/Extender: [name] in Treatment: 9 Education Assessment Education Provided To: Patient and Caregiver Education Topics Provided Wound/Skin Impairment: Handouts: Other: wound care as ordered Methods: Demonstration, Explain/Verbal Responses: State content correctly Electronic Signature(s) Signed: 07/31/2016 5:13:15 PM By: [name] Entered By: [name] on 07/31/2016 15:23:11 [name] ([id number]) -------------------------------------------------------------------------------- Wound Assessment Details Patient Name: [name], [name]. Date of Service: 07/31/2016 2:30 PM Medical Record Number: [id number] Patient Account Number: [id number] Date of Birth/Sex: [birth year] (82 y.o. Male) Treating RN: [name] Primary Care Knoah Nedeau: [name] Other Clinician: Referring Rozann Holts: [name] Treating Fausto Sampedro/Extender: [name] in Treatment: 9 Wound Status Wound Number: 1 Primary Pressure Ulcer Etiology: Wound Location: Left Malleolus - Lateral Wound Open Wounding Event: Pressure Injury Status: Date Acquired: 05/01/2016 Comorbid Cataracts, Anemia, Sleep Apnea, Weeks Of Treatment: 9 History: Arrhythmia, Hypertension, Dementia Clustered Wound: No Wound Measurements Length: (cm) 0.4 Width: (cm) 0.4 Depth: (cm) 0.3 Area: (cm) 0.126 Volume: (cm) 0.038 % Reduction in Area: 35.7% % Reduction in Volume: -90% Epithelialization: None Tunneling: No Undermining: No Wound Description Classification: Category/Stage II Wound Margin: Distinct, outline attached Exudate Amount: Large Exudate Type: Serous Exudate Color: amber Foul Odor After Cleansing: No Slough/Fibrino No Wound Bed Granulation Amount: Large (67-100%) Exposed Structure Granulation Quality: Pink Fat Layer (Subcutaneous Tissue)  Exposed: Yes Necrotic Amount: Small (1-33%) Necrotic Quality: Eschar, Adherent Slough Periwound Skin Texture Texture Color No Abnormalities Noted: No No Abnormalities Noted: No Erythema: Yes Moisture Erythema Location: Circumferential No Abnormalities Noted: No Temperature / Pain Temperature: No Abnormality Tenderness on Palpation: Yes Wound Preparation Ulcer Cleansing: Rinsed/Irrigated with Saline [name], [name] ([id number]) Topical Anesthetic Applied: Other: lidocaine 4%, Treatment Notes Wound #1 (Left, Lateral Malleolus) 1. Cleansed with: Clean wound with Normal Saline 2. Anesthetic Topical Lidocaine 4% cream to wound bed prior to debridement 4. Dressing Applied: Iodosorb Ointment Plain packing gauze 5. Secondary Dressing Applied Bordered Foam Dressing Dry Gauze Electronic Signature(s) Signed: 07/31/2016 5:13:15 PM By: [name] Entered By: [name] on 07/31/2016 14:40:25 [name] ([id number]) -------------------------------------------------------------------------------- Vitals Details Patient Name: [name], [name]. Date of Service: 07/31/2016 2:30 PM Medical Record Number: [id number] Patient Account Number: [id number] Date of Birth/Sex: [birth year] (82 y.o. Male) Treating RN: [name] Primary Care [name] Stockham: [name] Other Clinician: Referring Destony Prevost: [name] Treating Kathline Banbury/Extender: [name] in Treatment: 9 Vital Signs Time Taken: 14:33 Temperature (F): 97.6 Height (in): 69 Pulse (bpm): 58 Weight (lbs): 174.8 Respiratory Rate (breaths/min): 18 Body Mass Index (BMI): 25.8 Blood Pressure (mmHg): 124/55 Reference Range: 80 - 120 mg / dl Electronic Signature(s) Signed: 07/31/2016 5:13:15 PM By: [name] Entered By: [name] on 07/31/2016 14:34:45

## 2016-08-05 ENCOUNTER — Ambulatory Visit: Payer: PPO | Attending: Neurology | Admitting: Physical Therapy

## 2016-08-05 DIAGNOSIS — R296 Repeated falls: Secondary | ICD-10-CM | POA: Diagnosis not present

## 2016-08-05 DIAGNOSIS — M6281 Muscle weakness (generalized): Secondary | ICD-10-CM | POA: Insufficient documentation

## 2016-08-05 DIAGNOSIS — R2689 Other abnormalities of gait and mobility: Secondary | ICD-10-CM | POA: Insufficient documentation

## 2016-08-05 DIAGNOSIS — R293 Abnormal posture: Secondary | ICD-10-CM | POA: Diagnosis not present

## 2016-08-05 NOTE — Therapy (Signed)
[facility] [address] [place], [place], [address] Phone: [phone]   Fax:  [phone]  Physical Therapy Treatment  Patient Details  Name: [name] MRN: [id number] Date of Birth: 1934/08/26 Referring Provider: Jennings Books, MD  Encounter Date: 08/05/2016      PT End of Session - 08/05/16 1351    Visit Number 15   Number of Visits 20   Date for PT Re-Evaluation 09/02/16   Authorization - Visit Number 14   Authorization - Number of Visits 22   PT Start Time 1251   PT Stop Time 1340   PT Time Calculation (min) 49 min   Equipment Utilized During Treatment Gait belt   Activity Tolerance Patient tolerated treatment well   Behavior During Therapy [facility] for tasks assessed/performed      Past Medical History:  Diagnosis Date  . Absolute anemia 09/11/2014  . Anxiety   . Atrial fibrillation ([place]) 01/23/2014  . BP (high blood pressure) 11/07/2013  . Carotid artery disease ([place]) 09/11/2014  . Complete rotator cuff rupture of left shoulder 12/30/2013  . Dementia 09/11/2014  . Depression   . Heart murmur   . HLD (hyperlipidemia) 01/23/2014  . Hypertension   . Obstructive apnea 11/07/2013  . Personal history of other diseases of the circulatory system 01/23/2014  . Sleep apnea   . 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 Back Surgery- Tumor Removal    . SHOULDER SURGERY Right 2007    There were no vitals filed for this visit.      Subjective Assessment - 08/05/16 1347    Subjective Patient had an episode of incontinance last night and was fatigued throughout session. Pt. wife states pt. has been having a difficulty in the past few days with upright posture due to memory lapse. Increased back pain noted upon bulging region of lumbar as discussed prior.    Patient is accompained by: Family member   Pertinent History Hx of left knee pain, low back pain, shoulder pain. Hx  of falls, dementia, and teretoma of the spine   Limitations Sitting;Lifting;Standing;Walking;House hold activities   Diagnostic tests BERG   Patient Stated Goals get stronger, pick up legs better when walking, getting up easier and faster   Currently in Pain? Yes   Pain Score 3    Pain Location Back   Pain Orientation Lower   Pain Descriptors / Indicators Aching   Pain Type Chronic pain                                 PT Education - 08/05/16 1350    Education provided Yes   Education Details postural control, posture exercises, importance of HEP compliance.   Person(s) Educated Patient;Spouse   Methods Explanation;Demonstration;Handout   Comprehension Verbalized understanding;Returned demonstration             PT Long Term Goals - 08/05/16 1401      PT LONG TERM GOAL #1   Title Patient will increase score on Berg Balance to 49/56 for decreased fall risk.    Baseline 3/7: 50/56 ; 2/8: 44/56   Time 4   Period Weeks   Status Achieved     PT LONG TERM GOAL #2   Title Patient will have gross 4/5 bilateral LE strength to increase ability to perform functional activities.  Baseline Gross LLE 4-/5 knee flexion 3+/5, RLE 4/5   Time 4   Period Weeks   Status On-going     PT LONG TERM GOAL #3   Title Patient will ambulate 5 minutes with a cane without LOB to increase community mobility.     Baseline Patient able to walk 5 minutes with cane but with poor posture.    Time 4   Period Weeks   Status Achieved     PT LONG TERM GOAL #4   Title Patient will perform 10 sit to stands without use of hands or LOB to demonstrate improved capacity for functional activities.    Baseline Patient can perform 10 sit to stands with no hands with decreased eccentric control with fatigue.    Time 4   Period Weeks   Status Achieved     PT LONG TERM GOAL #5   Title Patient will have upright posture for 10 minutes to allow for better body mechanics and decreased back  pain.    Baseline Frequent cues required for postural control in standing and seated. Less cues required than before.    Time 4   Period Weeks   Status On-going     Additional Long Term Goals   Additional Long Term Goals Yes     PT LONG TERM GOAL #6   Title Patient will not have SI/back pain while ambulating to allow for increased community mobility.    Baseline Patient has occasional 4/10 back pain while ambulating   Time 4   Period Weeks   Status On-going     PT LONG TERM GOAL #7   Title Patient will perform 10 sit to stands from surface lower than PT room chairs to increase independence in home setting due to low height of furniture at home.    Baseline Patient able to perform 10 STS from low surface with UE   Time 4   Period Weeks   Status Achieved     PT LONG TERM GOAL #8   Title Patient will score a 52/56 on Berg to decrease fall risk to minimal and increase single limb abilities to improve carryover to household activities such as stepping into shower.    Baseline 3/7: 50/56, extreme difficulty stepping into shower 4/3: 49/56 (fatigued today)   Time 4   Period Weeks   Status On-going     PT LONG TERM GOAL  #9   TITLE Patient will demonstrate independence in home postural program to promote improved control of posture in seated and standing for progression to home based program.    Baseline Pt. was given new postural control program   Time 4   Period Weeks   Status New               Plan - 08/05/16 1400    Clinical Impression Statement Patient is having increased low back pain at location of protrusion due to decreased postural control in the past few days. Episode of incontinence occurred last night and caused pt. to be fatigued throughout session. Fatigue limited pt.'s performance in [place] with pt scoring 49/56, single limb stance, tandem stance, and bradykinesia/ decreased speed while performing task affecting pt.'s score.  Pt. demonstrated ability to perform  postural corrections with verbal and tactile cues.  Postural exercises and tasks were prescribed to patient with pt. and wife verbally agreeing to incorporate at home with written sheet for reminder. Pt. improved transfer ability to be able to perform sit to stands for  10 consecutive stands from low surface and no use of UE's.  Plan of care will progress to attending therapy sessions 1x a week with pt. progressing towards home program independence. Patient will continue to benefit from skilled physical therapy to decrease fall risk, improve gross strength and ROM, control pain and allow patient safe ambulation in community for improved quality of life.    Rehab Potential Fair   Clinical Impairments Affecting Rehab Potential hx of falls, dementia, teratoma, mitral valve polapse, cancer, a fib, L knee injury, L shoulder, back pain   PT Frequency 1x / week   PT Duration 4 weeks   PT Treatment/Interventions ADLs/Self Care Home Management;Aquatic Therapy;Cryotherapy;Electrical Stimulation;Biofeedback;Moist Heat;DME Instruction;Gait training;Stair training;Functional mobility training;Therapeutic activities;Therapeutic exercise;Balance training;Neuromuscular re-education;Patient/family education;Orthotic Fit/Training;Manual techniques;Passive range of motion;Dry needling;Energy conservation   PT Next Visit Plan posture   PT Home Exercise Plan see sheet   Consulted and Agree with Plan of Care Patient;Family member/caregiver   Family Member Consulted wife      Patient will benefit from skilled therapeutic intervention in order to improve the following deficits and impairments:  Abnormal gait, Decreased activity tolerance, Decreased balance, Decreased mobility, Decreased knowledge of use of DME, Decreased endurance, Decreased range of motion, Decreased safety awareness, Decreased skin integrity, Decreased knowledge of precautions, Decreased strength, Hypomobility, Difficulty walking, Impaired flexibility,  Postural dysfunction, Improper body mechanics, Decreased cognition  Visit Diagnosis: Muscle weakness (generalized)  Other abnormalities of gait and mobility  Repeated falls  Abnormal posture     Problem List Patient Active Problem List   Diagnosis Date Noted  . Fall 02/18/2016  . Scalp laceration 02/18/2016  . Splenic laceration 02/18/2016  . Acute blood loss anemia 02/18/2016  . Multiple rib fractures 02/16/2016  . Mild dementia 04/10/2015  . Aggrieved 03/02/2015  . Absolute anemia 09/11/2014  . Carotid artery disease ([place]) 09/11/2014  . Dementia 09/11/2014  . History of repair of rotator cuff 03/02/2014  . Atrial fibrillation with RVR ([place]) 01/23/2014  . Personal history of other diseases of the circulatory system 01/23/2014  . HLD (hyperlipidemia) 01/23/2014  . Complete rotator cuff rupture of left shoulder 12/30/2013  . BP (high blood pressure) 11/07/2013  . Amnesia 11/07/2013  . Obstructive apnea 11/07/2013    Varney Biles, PT, DPT This entire session was performed under direct supervision and direction of Wayne Both, DPT, a licensed therapist . He has personally read, edited and approve of the note as written.  Janna Arch, SPT 08/05/2016, 4:44 PM  [facility] [address] [place], [place], [address] Phone: [phone]   Fax:  [phone]  Name: HYATT CAPOBIANCO MRN: [id number] Date of Birth: 03/19/1935

## 2016-08-05 NOTE — Therapy (Signed)
[facility] [address] [place], [place], [address] Phone: [phone]   Fax:  [phone]  Physical Therapy Treatment  Patient Details  Name: [name] MRN: [id number] Date of Birth: October 17, 1934 Referring Provider: Jennings Books, MD  Encounter Date: 08/05/2016      PT End of Session - 08/05/16 1351    Visit Number 15   Number of Visits 20   Date for PT Re-Evaluation 09/02/16   Authorization - Visit Number 14   Authorization - Number of Visits 22   PT Start Time 1251   PT Stop Time 1340   PT Time Calculation (min) 49 min   Equipment Utilized During Treatment Gait belt   Activity Tolerance Patient tolerated treatment well   Behavior During Therapy [facility] for tasks assessed/performed      Past Medical History:  Diagnosis Date  . Absolute anemia 09/11/2014  . Anxiety   . Atrial fibrillation ([place]) 01/23/2014  . BP (high blood pressure) 11/07/2013  . Carotid artery disease ([place]) 09/11/2014  . Complete rotator cuff rupture of left shoulder 12/30/2013  . Dementia 09/11/2014  . Depression   . Heart murmur   . HLD (hyperlipidemia) 01/23/2014  . Hypertension   . Obstructive apnea 11/07/2013  . Personal history of other diseases of the circulatory system 01/23/2014  . Sleep apnea   . Teratoma, malignant ([place])    lower back tumor    Past Surgical History:  Procedure Laterality Date  . Back Surgery- Tumor Removal    . SHOULDER SURGERY Right 2007    There were no vitals filed for this visit.      Subjective Assessment - 08/05/16 1347    Subjective Patient had an episode of incontinance last night and was fatigued throughout session. Pt. wife states pt. has been having a difficulty in the past few days with upright posture due to memory lapse. Increased back pain noted upon bulging region of lumbar as discussed prior.    Patient is accompained by: Family member   Pertinent History Hx of left knee pain, low back pain, shoulder pain. Hx  of falls, dementia, and teretoma of the spine   Limitations Sitting;Lifting;Standing;Walking;House hold activities   Diagnostic tests BERG   Patient Stated Goals get stronger, pick up legs better when walking, getting up easier and faster   Currently in Pain? Yes   Pain Score 3    Pain Location Back   Pain Orientation Lower   Pain Descriptors / Indicators Aching   Pain Type Chronic pain     TherEx: Postural control seated: Cues for upright posture, holding chest up. In seat with back and without back for multiple holds. Postural control standing: Cues for shoulder positioning for back relief Corner chest stretch for postural control 2x30 seconds Standing with back against wall with cues for putting shoulders back against wall and head to promote upright posture 6x with 10 second holds. STS 10x from low surface no use of UE's. Seated hamstring stretch with DF overpressure x 45 seconds.   Large steps within // bars with UE assistance for chest positioning and slow pace for dynamic stretch 2x.   Neuro: LSVT big inspired step with clap and step back in // bars with focus on proper body mechanics. No LOB. BERG: 49/56    Response to medical necessity: Pt. Will benefit from progression of POC x1/week. Pt. Presents with postural limitations affecting ambulatory capacity. Improved strength and functional balance.  PT Education - 08/05/16 1350    Education provided Yes   Education Details postural control, posture exercises, importance of HEP compliance.   Person(s) Educated Patient;Spouse   Methods Explanation;Demonstration;Handout   Comprehension Verbalized understanding;Returned demonstration             PT Long Term Goals - 08/05/16 1401      PT LONG TERM GOAL #1   Title Patient will increase score on Berg Balance to 49/56 for decreased fall risk.    Baseline 3/7: 50/56 ; 2/8: 44/56   Time 4   Period Weeks   Status Achieved     PT LONG TERM GOAL #2   Title  Patient will have gross 4/5 bilateral LE strength to increase ability to perform functional activities.    Baseline Gross LLE 4-/5 knee flexion 3+/5, RLE 4/5   Time 4   Period Weeks   Status On-going     PT LONG TERM GOAL #3   Title Patient will ambulate 5 minutes with a cane without LOB to increase community mobility.     Baseline Patient able to walk 5 minutes with cane but with poor posture.    Time 4   Period Weeks   Status Achieved     PT LONG TERM GOAL #4   Title Patient will perform 10 sit to stands without use of hands or LOB to demonstrate improved capacity for functional activities.    Baseline Patient can perform 10 sit to stands with no hands with decreased eccentric control with fatigue.    Time 4   Period Weeks   Status Achieved     PT LONG TERM GOAL #5   Title Patient will have upright posture for 10 minutes to allow for better body mechanics and decreased back pain.    Baseline Frequent cues required for postural control in standing and seated. Less cues required than before.    Time 4   Period Weeks   Status On-going     Additional Long Term Goals   Additional Long Term Goals Yes     PT LONG TERM GOAL #6   Title Patient will not have SI/back pain while ambulating to allow for increased community mobility.    Baseline Patient has occasional 4/10 back pain while ambulating   Time 4   Period Weeks   Status On-going     PT LONG TERM GOAL #7   Title Patient will perform 10 sit to stands from surface lower than PT room chairs to increase independence in home setting due to low height of furniture at home.    Baseline Patient able to perform 10 STS from low surface with UE   Time 4   Period Weeks   Status Achieved     PT LONG TERM GOAL #8   Title Patient will score a 52/56 on Berg to decrease fall risk to minimal and increase single limb abilities to improve carryover to household activities such as stepping into shower.    Baseline 3/7: 50/56, extreme  difficulty stepping into shower 4/3: 49/56 (fatigued today)   Time 4   Period Weeks   Status On-going     PT LONG TERM GOAL  #9   TITLE Patient will demonstrate independence in home postural program to promote improved control of posture in seated and standing for progression to home based program.    Baseline Pt. was given new postural control program   Time 4   Period Weeks   Status New  Plan - 08/05/16 1400    Clinical Impression Statement Patient is having increased low back pain at location of protrusion due to decreased postural control in the past few days. Episode of incontinence occurred last night and caused pt. to be fatigued throughout session. Fatigue limited pt.'s performance in [place] with pt scoring 49/56, single limb stance, tandem stance, and bradykinesia/ decreased speed while performing task affecting pt.'s score.  Pt. demonstrated ability to perform postural corrections with verbal and tactile cues.  Postural exercises and tasks were prescribed to patient with pt. and wife verbally agreeing to incorporate at home with written sheet for reminder. Pt. improved transfer ability to be able to perform sit to stands for 10 consecutive stands from low surface and no use of UE's.  Plan of care will progress to attending therapy sessions 1x a week with pt. progressing towards home program independence. Patient will continue to benefit from skilled physical therapy to decrease fall risk, improve gross strength and ROM, control pain and allow patient safe ambulation in community for improved quality of life.    Rehab Potential Fair   Clinical Impairments Affecting Rehab Potential hx of falls, dementia, teratoma, mitral valve polapse, cancer, a fib, L knee injury, L shoulder, back pain   PT Frequency 1x / week   PT Duration 4 weeks   PT Treatment/Interventions ADLs/Self Care Home Management;Aquatic Therapy;Cryotherapy;Electrical Stimulation;Biofeedback;Moist Heat;DME  Instruction;Gait training;Stair training;Functional mobility training;Therapeutic activities;Therapeutic exercise;Balance training;Neuromuscular re-education;Patient/family education;Orthotic Fit/Training;Manual techniques;Passive range of motion;Dry needling;Energy conservation   PT Next Visit Plan posture   PT Home Exercise Plan see sheet   Consulted and Agree with Plan of Care Patient;Family member/caregiver   Family Member Consulted wife      Patient will benefit from skilled therapeutic intervention in order to improve the following deficits and impairments:  Abnormal gait, Decreased activity tolerance, Decreased balance, Decreased mobility, Decreased knowledge of use of DME, Decreased endurance, Decreased range of motion, Decreased safety awareness, Decreased skin integrity, Decreased knowledge of precautions, Decreased strength, Hypomobility, Difficulty walking, Impaired flexibility, Postural dysfunction, Improper body mechanics, Decreased cognition  Visit Diagnosis: Muscle weakness (generalized)  Other abnormalities of gait and mobility  Repeated falls  Abnormal posture     Problem List Patient Active Problem List   Diagnosis Date Noted  . Fall 02/18/2016  . Scalp laceration 02/18/2016  . Splenic laceration 02/18/2016  . Acute blood loss anemia 02/18/2016  . Multiple rib fractures 02/16/2016  . Mild dementia 04/10/2015  . Aggrieved 03/02/2015  . Absolute anemia 09/11/2014  . Carotid artery disease ([place]) 09/11/2014  . Dementia 09/11/2014  . History of repair of rotator cuff 03/02/2014  . Atrial fibrillation with RVR ([place]) 01/23/2014  . Personal history of other diseases of the circulatory system 01/23/2014  . HLD (hyperlipidemia) 01/23/2014  . Complete rotator cuff rupture of left shoulder 12/30/2013  . BP (high blood pressure) 11/07/2013  . Amnesia 11/07/2013  . Obstructive apnea 11/07/2013    Janna Arch 08/05/2016, 2:04 PM  [facility] [address] [place], [place], [address] Phone: [phone]   Fax:  [phone]  Name: [name] MRN: [id number] Date of Birth: 02-May-1935

## 2016-08-05 NOTE — Patient Instructions (Signed)
When your back hurts:  Try to consistently keep back in UPRIGHT position 1) NO LEANING FORWARD 2) CHIN AND SHOULDERS back (head over shoulders, shoulders over hips, hips over toes)

## 2016-08-07 ENCOUNTER — Ambulatory Visit: Payer: PPO | Admitting: Physical Therapy

## 2016-08-07 ENCOUNTER — Encounter: Payer: PPO | Attending: Surgery | Admitting: Surgery

## 2016-08-07 DIAGNOSIS — I10 Essential (primary) hypertension: Secondary | ICD-10-CM | POA: Diagnosis not present

## 2016-08-07 DIAGNOSIS — D649 Anemia, unspecified: Secondary | ICD-10-CM | POA: Diagnosis not present

## 2016-08-07 DIAGNOSIS — I4891 Unspecified atrial fibrillation: Secondary | ICD-10-CM | POA: Diagnosis not present

## 2016-08-07 DIAGNOSIS — L89522 Pressure ulcer of left ankle, stage 2: Secondary | ICD-10-CM | POA: Diagnosis not present

## 2016-08-07 DIAGNOSIS — E785 Hyperlipidemia, unspecified: Secondary | ICD-10-CM | POA: Diagnosis not present

## 2016-08-07 DIAGNOSIS — Z87891 Personal history of nicotine dependence: Secondary | ICD-10-CM | POA: Insufficient documentation

## 2016-08-07 DIAGNOSIS — F0280 Dementia in other diseases classified elsewhere without behavioral disturbance: Secondary | ICD-10-CM | POA: Insufficient documentation

## 2016-08-07 DIAGNOSIS — L89523 Pressure ulcer of left ankle, stage 3: Secondary | ICD-10-CM | POA: Diagnosis not present

## 2016-08-08 NOTE — Progress Notes (Signed)
[name], [name] ([id number]) Visit Report for 08/07/2016 Chief Complaint Document Details Patient Name: [name], [name]. Date of Service: 08/07/2016 2:30 PM Medical Record Number: [id number] Patient Account Number: [id number] Date of Birth/Sex: [birth year] (82 y.o. Male) Treating RN: Ahmed Prima Primary Care Provider: [name] Other Clinician: Referring Provider: [name] Treating Provider/Extender: [name] in Treatment: 10 Information Obtained from: Patient Chief Complaint Patient is at the clinic for treatment of an open pressure ulcer to the left ankle which she's had for about 5 weeks Electronic Signature(s) Signed: 08/07/2016 3:22:35 PM By: Christin Fudge MD, FACS Entered By: Christin Fudge on 08/07/2016 15:22:35 [name] ([id number]) -------------------------------------------------------------------------------- HPI Details Patient Name: [name], [name]. Date of Service: 08/07/2016 2:30 PM Medical Record Number: [id number] Patient Account Number: [id number] Date of Birth/Sex: March 17, 1977 (82 y.o. Male) Treating RN: Ahmed Prima Primary Care Provider: [name] Other Clinician: Referring Provider: [name] Treating Provider/Extender: [name] in Treatment: 10 History of Present Illness Location: left ankle laterally Quality: Patient reports experiencing a dull pain to affected area(s). Severity: Patient states wound are getting worse. Duration: Patient has had the wound for < 5 weeks prior to presenting for treatment Timing: Pain in wound is Intermittent (comes and goes Context: The wound appeared gradually over time Modifying Factors: Other treatment(s) tried include:put on Bactroban and doxycycline Associated Signs and Symptoms: Patient reports having difficulty standing for long periods. HPI Description: 82 year old gentleman who is known to have a history of dementia but is fairly functional and is able to  ambulate has been having a pressure ulcer to the left ankle due to laying in bed for longer than he should. Past history of atrial fibrillation, dementia, hyperlipidemia, hypertension. He's also had some orthopedic related surgeries in the past and was a former smoker and is given up in 1969. Recently seen by his PCP who put him on doxycycline and asked him to apply Bactroban and see [place] at the wound clinic. 06/05/2016 -- had a x-ray of the left ankle which showed no acute bony pathology. Electronic Signature(s) Signed: 08/07/2016 3:23:00 PM By: Christin Fudge MD, FACS Entered By: Christin Fudge on 08/07/2016 15:23:00 [name] ([id number]) -------------------------------------------------------------------------------- Physical Exam Details Patient Name: [name], [name]. Date of Service: 08/07/2016 2:30 PM Medical Record Number: [id number] Patient Account Number: [id number] Date of Birth/Sex: 1976-09-01 (82 y.o. Male) Treating RN: Ahmed Prima Primary Care Provider: [name] Other Clinician: Referring Provider: [name] Treating Provider/Extender: [name] in Treatment: 10 Constitutional . Pulse regular. Respirations normal and unlabored. Afebrile. . Eyes Nonicteric. Reactive to light. Ears, Nose, Mouth, and Throat Lips, teeth, and gums WNL.Marland Kitchen Moist mucosa without lesions. Neck supple and nontender. No palpable supraclavicular or cervical adenopathy. Normal sized without goiter. Respiratory WNL. No retractions.. Breath sounds WNL, No rubs, rales, rhonchi, or wheeze.. Cardiovascular Heart rhythm and rate regular, no murmur or gallop.. Pedal Pulses WNL. No clubbing, cyanosis or edema. Chest Breasts symmetical and no nipple discharge.. Breast tissue WNL, no masses, lumps, or tenderness.. Lymphatic No adneopathy. No adenopathy. No adenopathy. Musculoskeletal Adexa without tenderness or enlargement.. Digits and nails w/o clubbing, cyanosis, infection,  petechiae, ischemia, or inflammatory conditions.. Integumentary (Hair, Skin) No suspicious lesions. No crepitus or fluctuance. No peri-wound warmth or erythema. No masses.Marland Kitchen Psychiatric Judgement and insight Intact.. No evidence of depression, anxiety, or agitation.. Notes the base is clean but there is no granulation tissue and the edges are punched out. No sharp debridement was required but  I washed the area well with moist saline gauze Electronic Signature(s) Signed: 08/07/2016 3:23:34 PM By: Christin Fudge MD, FACS Entered By: Christin Fudge on 08/07/2016 15:23:34 [name] ([id number]) -------------------------------------------------------------------------------- Physician Orders Details Patient Name: [name], [name]. Date of Service: 08/07/2016 2:30 PM Medical Record Number: [id number] Patient Account Number: [id number] Date of Birth/Sex: July 12, 1976 (82 y.o. Male) Treating RN: Ahmed Prima Primary Care Provider: [name] Other Clinician: Referring Provider: [name] Treating Provider/Extender: [name] in Treatment: 10 Verbal / Phone Orders: Yes Clinician: Carolyne Fiscal, Debi Read Back and Verified: Yes Diagnosis Coding Wound Cleansing Wound #1 Left,Lateral Malleolus o Clean wound with Normal Saline. o May Shower, gently pat wound dry prior to applying new dressing. Anesthetic Wound #1 Left,Lateral Malleolus o Topical Lidocaine 4% cream applied to wound bed prior to debridement - for clinic use Skin Barriers/Peri-Wound Care Wound #1 Left,Lateral Malleolus o Skin Prep Primary Wound Dressing Wound #1 Left,Lateral Malleolus o Hydrogel o Other: - sorbact Secondary Dressing Wound #1 Left,Lateral Malleolus o Dry Gauze o Boardered Foam Dressing Follow-up Appointments Wound #1 Left,Lateral Malleolus o Return Appointment in 1 week. Edema Control Wound #1 Left,Lateral Malleolus o Elevate legs to the level of the heart and  pump ankles as often as possible Off-Loading Wound #1 Left,Lateral Malleolus o Turn and reposition every 2 hours o Other: - keep pressure off of affected area [name], [name]. ([id number]) Additional Orders / Instructions Wound #1 Left,Lateral Malleolus o Increase protein intake. Medications-please add to medication list. Wound #1 Left,Lateral Malleolus o Other: - Vitamin A, Vitamin C, Zinc, MVI Electronic Signature(s) Signed: 08/07/2016 4:30:26 PM By: Christin Fudge MD, FACS Signed: 08/07/2016 5:04:07 PM By: Alric Quan Entered By: Alric Quan on 08/07/2016 15:02:29 [name] ([id number]) -------------------------------------------------------------------------------- Problem List Details Patient Name: [name], [name]. Date of Service: 08/07/2016 2:30 PM Medical Record Number: [id number] Patient Account Number: [id number] Date of Birth/Sex: 01/07/1935 (82 y.o. Male) Treating RN: Ahmed Prima Primary Care Provider: [name] Other Clinician: Referring Provider: [name] Treating Provider/Extender: [name] in Treatment: 10 Active Problems ICD-10 Encounter Code Description Active Date Diagnosis [phone] Pressure ulcer of left ankle, stage 2 05/29/2016 Yes F02.80 Dementia in other diseases classified elsewhere without 05/29/2016 Yes behavioral disturbance Inactive Problems Resolved Problems Electronic Signature(s) Signed: 08/07/2016 3:22:18 PM By: Christin Fudge MD, FACS Entered By: Christin Fudge on 08/07/2016 15:22:18 [name] ([id number]) -------------------------------------------------------------------------------- Progress Note Details Patient Name: [name], [name]. Date of Service: 08/07/2016 2:30 PM Medical Record Number: [id number] Patient Account Number: [id number] Date of Birth/Sex: [birth year] (82 y.o. Male) Treating RN: Ahmed Prima Primary Care Provider: [name] Other Clinician: Referring Provider:  [name] Treating Provider/Extender: [name] in Treatment: 10 Subjective Chief Complaint Information obtained from Patient Patient is at the clinic for treatment of an open pressure ulcer to the left ankle which she's had for about 5 weeks History of Present Illness (HPI) The following HPI elements were documented for the patient's wound: Location: left ankle laterally Quality: Patient reports experiencing a dull pain to affected area(s). Severity: Patient states wound are getting worse. Duration: Patient has had the wound for < 5 weeks prior to presenting for treatment Timing: Pain in wound is Intermittent (comes and goes Context: The wound appeared gradually over time Modifying Factors: Other treatment(s) tried include:put on Bactroban and doxycycline Associated Signs and Symptoms: Patient reports having difficulty standing for long periods. 82 year old gentleman who is known to have a history of dementia but  is fairly functional and is able to ambulate has been having a pressure ulcer to the left ankle due to laying in bed for longer than he should. Past history of atrial fibrillation, dementia, hyperlipidemia, hypertension. He's also had some orthopedic related surgeries in the past and was a former smoker and is given up in 1969. Recently seen by his PCP who put him on doxycycline and asked him to apply Bactroban and see [place] at the wound clinic. 06/05/2016 -- had a x-ray of the left ankle which showed no acute bony pathology. Objective Constitutional Pulse regular. Respirations normal and unlabored. Afebrile. Vitals Time Taken: 2:36 PM, Height: 69 in, Weight: 174.8 lbs, BMI: 25.8, Pulse: 52 bpm, Respiratory Rate: 18 breaths/min, Blood Pressure: 132/54 mmHg. [name], [name]. ([id number]) Eyes Nonicteric. Reactive to light. Ears, Nose, Mouth, and Throat Lips, teeth, and gums WNL.Marland Kitchen Moist mucosa without lesions. Neck supple and nontender. No palpable  supraclavicular or cervical adenopathy. Normal sized without goiter. Respiratory WNL. No retractions.. Breath sounds WNL, No rubs, rales, rhonchi, or wheeze.. Cardiovascular Heart rhythm and rate regular, no murmur or gallop.. Pedal Pulses WNL. No clubbing, cyanosis or edema. Chest Breasts symmetical and no nipple discharge.. Breast tissue WNL, no masses, lumps, or tenderness.. Lymphatic No adneopathy. No adenopathy. No adenopathy. Musculoskeletal Adexa without tenderness or enlargement.. Digits and nails w/o clubbing, cyanosis, infection, petechiae, ischemia, or inflammatory conditions.Marland Kitchen Psychiatric Judgement and insight Intact.. No evidence of depression, anxiety, or agitation.. General Notes: the base is clean but there is no granulation tissue and the edges are punched out. No sharp debridement was required but I washed the area well with moist saline gauze Integumentary (Hair, Skin) No suspicious lesions. No crepitus or fluctuance. No peri-wound warmth or erythema. No masses.. Wound #1 status is Open. Original cause of wound was Pressure Injury. The wound is located on the Left,Lateral Malleolus. The wound measures 0.4cm length x 0.4cm width x 0.3cm depth; 0.126cm^2 area and 0.038cm^3 volume. There is Fat Layer (Subcutaneous Tissue) Exposed exposed. There is no tunneling or undermining noted. There is a large amount of serous drainage noted. The wound margin is distinct with the outline attached to the wound base. There is medium (34-66%) pink granulation within the wound bed. There is a medium (34-66%) amount of necrotic tissue within the wound bed including Eschar and Adherent Slough. The periwound skin appearance exhibited: Erythema. The surrounding wound skin color is noted with erythema which is circumferential. Periwound temperature was noted as No Abnormality. The periwound has tenderness on palpation. Assessment [name], [name] ([id number]) Active Problems ICD-10 [phone]  - Pressure ulcer of left ankle, stage 2 F02.80 - Dementia in other diseases classified elsewhere without behavioral disturbance Plan Wound Cleansing: Wound #1 Left,Lateral Malleolus: Clean wound with Normal Saline. May Shower, gently pat wound dry prior to applying new dressing. Anesthetic: Wound #1 Left,Lateral Malleolus: Topical Lidocaine 4% cream applied to wound bed prior to debridement - for clinic use Skin Barriers/Peri-Wound Care: Wound #1 Left,Lateral Malleolus: Skin Prep Primary Wound Dressing: Wound #1 Left,Lateral Malleolus: Hydrogel Other: - sorbact Secondary Dressing: Wound #1 Left,Lateral Malleolus: Dry Gauze Boardered Foam Dressing Follow-up Appointments: Wound #1 Left,Lateral Malleolus: Return Appointment in 1 week. Edema Control: Wound #1 Left,Lateral Malleolus: Elevate legs to the level of the heart and pump ankles as often as possible Off-Loading: Wound #1 Left,Lateral Malleolus: Turn and reposition every 2 hours Other: - keep pressure off of affected area Additional Orders / Instructions: Wound #1 Left,Lateral Malleolus: Increase protein intake. Medications-please add to medication  list.: Wound #1 Left,Lateral Malleolus: Other: - Vitamin A, Vitamin C, Zinc, MVI [name], Jared M. ([id number]) I have recommended: 1. Sorbact with hydrogel to be placed into the wound and a protective bordered foam. 2. Off-Loading has been discussed in great detail 3. adequate protein, vitamin A, vitamin C and zinc 4. Regular visits to the wound center. Electronic Signature(s) Signed: 08/07/2016 3:24:24 PM By: Christin Fudge MD, FACS Entered By: Christin Fudge on 08/07/2016 15:24:24 [name] ([id number]) -------------------------------------------------------------------------------- SuperBill Details Patient Name: [name], [name]. Date of Service: 08/07/2016 Medical Record Number: [id number] Patient Account Number: [id number] Date of Birth/Sex: 1934-11-19 (82 y.o.  Male) Treating RN: Ahmed Prima Primary Care Provider: [name] Other Clinician: Referring Provider: [name] Treating Provider/Extender: [name] in Treatment: 10 Diagnosis Coding ICD-10 Codes Code Description [phone] Pressure ulcer of left ankle, stage 2 F02.80 Dementia in other diseases classified elsewhere without behavioral disturbance Facility Procedures CPT4 Code: [id number] Description: 99213 - WOUND CARE VISIT-LEV 3 EST PT Modifier: Quantity: 1 Physician Procedures CPT4: Description Modifier Quantity Code [phone] 99213 - WC PHYS LEVEL 3 - EST PT 1 ICD-10 Description Diagnosis L89.522 Pressure ulcer of left ankle, stage 2 F02.80 Dementia in other diseases classified elsewhere without behavioral disturbance Electronic Signature(s) Signed: 08/07/2016 4:30:26 PM By: Christin Fudge MD, FACS Signed: 08/07/2016 5:04:07 PM By: Alric Quan Previous Signature: 08/07/2016 3:24:56 PM Version By: Christin Fudge MD, FACS Entered By: Alric Quan on 08/07/2016 20:91:98

## 2016-08-08 NOTE — Progress Notes (Signed)
RUDOLF, [name] ([id number]) Visit Report for 08/07/2016 Arrival Information Details Patient Name: [name], [name]. Date of Service: 08/07/2016 2:30 PM Medical Record Number: [id number] Patient Account Number: [id number] Date of Birth/Sex: 1935-04-14 (82 y.o. Male) Treating RN: Ahmed Prima Primary Care Anhthu Perdew: [name] Other Clinician: Referring Brently Voorhis: [name] Treating Glennis Borger/Extender: [name] in Treatment: 10 Visit Information History Since Last Visit All ordered tests and consults were completed: No Patient Arrived: [name] Added or deleted any medications: No Arrival Time: 14:33 Any new allergies or adverse reactions: No Accompanied By: wife Had a fall or experienced change in No Transfer Assistance: None activities of daily living that may affect Patient Identification Verified: Yes risk of falls: Secondary Verification Process Completed: Yes Signs or symptoms of abuse/neglect since last No Patient Requires Transmission-Based No visito Precautions: Hospitalized since last visit: No Patient Has Alerts: Yes Has Dressing in Place as Prescribed: Yes Patient Alerts: ASA Pain Present Now: No Electronic Signature(s) Signed: 08/07/2016 5:04:07 PM By: Alric Quan Entered By: Alric Quan on 08/07/2016 14:36:45 [name] ([id number]) -------------------------------------------------------------------------------- Clinic Level of Care Assessment Details Patient Name: [name]. Date of Service: 08/07/2016 2:30 PM Medical Record Number: [id number] Patient Account Number: [id number] Date of Birth/Sex: 08-26-1934 (82 y.o. Male) Treating RN: Ahmed Prima Primary Care Lakeisa Heninger: [name] Other Clinician: Referring Joon Pohle: [name] Treating Annitta Fifield/Extender: [name] in Treatment: 10 Clinic Level of Care Assessment Items TOOL 4 Quantity Score X - Use when only an EandM is performed on FOLLOW-UP  visit 1 0 ASSESSMENTS - Nursing Assessment / Reassessment X - Reassessment of Co-morbidities (includes updates in patient status) 1 10 X - Reassessment of Adherence to Treatment Plan 1 5 ASSESSMENTS - Wound and Skin Assessment / Reassessment X - Simple Wound Assessment / Reassessment - one wound 1 5 []  - Complex Wound Assessment / Reassessment - multiple wounds 0 []  - Dermatologic / Skin Assessment (not related to wound area) 0 ASSESSMENTS - Focused Assessment []  - Circumferential Edema Measurements - multi extremities 0 []  - Nutritional Assessment / Counseling / Intervention 0 []  - Lower Extremity Assessment (monofilament, tuning fork, pulses) 0 []  - Peripheral Arterial Disease Assessment (using hand held doppler) 0 ASSESSMENTS - Ostomy and/or Continence Assessment and Care []  - Incontinence Assessment and Management 0 []  - Ostomy Care Assessment and Management (repouching, etc.) 0 PROCESS - Coordination of Care X - Simple Patient / Family Education for ongoing care 1 15 []  - Complex (extensive) Patient / Family Education for ongoing care 0 []  - Staff obtains Programmer, systems, Records, Test Results / Process Orders 0 []  - Staff telephones HHA, Nursing Homes / Clarify orders / etc 0 []  - Routine Transfer to another Facility (non-emergent condition) 0 [name], [name]. ([id number]) []  - Routine Hospital Admission (non-emergent condition) 0 []  - New Admissions / Biomedical engineer / Ordering NPWT, Apligraf, etc. 0 []  - Emergency Hospital Admission (emergent condition) 0 X - Simple Discharge Coordination 1 10 []  - Complex (extensive) Discharge Coordination 0 PROCESS - Special Needs []  - Pediatric / Minor Patient Management 0 []  - Isolation Patient Management 0 []  - Hearing / Language / Visual special needs 0 []  - Assessment of Community assistance (transportation, D/C planning, etc.) 0 []  - Additional assistance / Altered mentation 0 []  - Support Surface(s) Assessment (bed, cushion, seat,  etc.) 0 INTERVENTIONS - Wound Cleansing / Measurement X - Simple Wound Cleansing - one wound 1 5 []  - Complex Wound Cleansing - multiple wounds  0 X - Wound Imaging (photographs - any number of wounds) 1 5 []  - Wound Tracing (instead of photographs) 0 X - Simple Wound Measurement - one wound 1 5 []  - Complex Wound Measurement - multiple wounds 0 INTERVENTIONS - Wound Dressings X - Small Wound Dressing one or multiple wounds 1 10 []  - Medium Wound Dressing one or multiple wounds 0 []  - Large Wound Dressing one or multiple wounds 0 X - Application of Medications - topical 1 5 []  - Application of Medications - injection 0 INTERVENTIONS - Miscellaneous []  - External ear exam 0 [name], [name]. ([id number]) []  - Specimen Collection (cultures, biopsies, blood, body fluids, etc.) 0 []  - Specimen(s) / Culture(s) sent or taken to Lab for analysis 0 []  - Patient Transfer (multiple staff / [name] / Similar devices) 0 []  - Simple Staple / Suture removal (25 or less) 0 []  - Complex Staple / Suture removal (26 or more) 0 []  - Hypo / Hyperglycemic Management (close monitor of Blood Glucose) 0 []  - Ankle / Brachial Index (ABI) - do not check if billed separately 0 X - Vital Signs 1 5 Has the patient been seen at the hospital within the last three years: Yes Total Score: 80 Level Of Care: New/Established - Level 3 Electronic Signature(s) Signed: 08/07/2016 5:04:07 PM By: Alric Quan Entered By: Alric Quan on 08/07/2016 15:27:12 [name] ([id number]) -------------------------------------------------------------------------------- Encounter Discharge Information Details Patient Name: [name], [name]. Date of Service: 08/07/2016 2:30 PM Medical Record Number: [id number] Patient Account Number: [id number] Date of Birth/Sex: 1935-01-19 (82 y.o. Male) Treating RN: Ahmed Prima Primary Care Kaeo Jacome: [name] Other Clinician: Referring Tzirel Leonor: [name] Treating  Dequavious Harshberger/Extender: [name] in Treatment: 10 Encounter Discharge Information Items Discharge Pain Level: 0 Discharge Condition: Stable Ambulatory Status: Cane Discharge Destination: Home Transportation: Private Auto Accompanied By: wife Schedule Follow-up Appointment: Yes Medication Reconciliation completed No and provided to Patient/Care Alianis Trimmer: Provided on Clinical Summary of Care: 08/07/2016 Form Type Recipient Paper Patient ET Electronic Signature(s) Signed: 08/07/2016 3:16:05 PM By: Ruthine Dose Entered By: Ruthine Dose on 08/07/2016 15:16:05 [name] ([id number]) -------------------------------------------------------------------------------- Lower Extremity Assessment Details Patient Name: [name]. Date of Service: 08/07/2016 2:30 PM Medical Record Number: [id number] Patient Account Number: [id number] Date of Birth/Sex: [birth year] (82 y.o. Male) Treating RN: Ahmed Prima Primary Care Antero Derosia: [name] Other Clinician: Referring Maejor Erven: [name] Treating Masa Lubin/Extender: [name] in Treatment: 10 Vascular Assessment Pulses: Dorsalis Pedis Palpable: [Left:Yes] Posterior Tibial Extremity colors, hair growth, and conditions: Extremity Color: [Left:Normal] Temperature of Extremity: [Left:Warm] Capillary Refill: [Left:< 3 seconds] Electronic Signature(s) Signed: 08/07/2016 5:04:07 PM By: Alric Quan Entered By: Alric Quan on 08/07/2016 14:46:55 Ho, Jared Au ([id number]) -------------------------------------------------------------------------------- Multi Wound Chart Details Patient Name: [name]. Date of Service: 08/07/2016 2:30 PM Medical Record Number: [id number] Patient Account Number: [id number] Date of Birth/Sex: Aug 14, 1934 (82 y.o. Male) Treating RN: Ahmed Prima Primary Care Maddux First: [name] Other Clinician: Referring Normand Damron: [name] Treating  Kelina Beauchamp/Extender: [name] in Treatment: 10 Vital Signs Height(in): 69 Pulse(bpm): 52 Weight(lbs): 174.8 Blood Pressure 132/54 (mmHg): Body Mass Index(BMI): 26 Temperature(F): Respiratory Rate 18 (breaths/min): Photos: [1:No Photos] [N/A:N/A] Wound Location: [1:Left Malleolus - Lateral] [N/A:N/A] Wounding Event: [1:Pressure Injury] [N/A:N/A] Primary Etiology: [1:Pressure Ulcer] [N/A:N/A] Comorbid History: [1:Cataracts, Anemia, Sleep Apnea, Arrhythmia, Hypertension, Dementia] [N/A:N/A] Date Acquired: [1:05/01/2016] [N/A:N/A] Weeks of Treatment: [1:10] [N/A:N/A] Wound Status: [1:Open] [N/A:N/A] Measurements L x W x D 0.4x0.4x0.3 [N/A:N/A] (  cm) Area (cm) : [1:0.126] [N/A:N/A] Volume (cm) : [1:0.038] [N/A:N/A] % Reduction in Area: [1:35.70%] [N/A:N/A] % Reduction in Volume: -90.00% [N/A:N/A] Classification: [1:Category/Stage II] [N/A:N/A] Exudate Amount: [1:Large] [N/A:N/A] Exudate Type: [1:Serous] [N/A:N/A] Exudate Color: [1:amber] [N/A:N/A] Wound Margin: [1:Distinct, outline attached] [N/A:N/A] Granulation Amount: [1:Medium (34-66%)] [N/A:N/A] Granulation Quality: [1:Pink] [N/A:N/A] Necrotic Amount: [1:Medium (34-66%)] [N/A:N/A] Necrotic Tissue: [1:Eschar, Adherent Slough] [N/A:N/A] Exposed Structures: [1:Fat Layer (Subcutaneous Tissue) Exposed: Yes] [N/A:N/A] Epithelialization: [1:None] [N/A:N/A] Periwound Skin Texture: No Abnormalities Noted [N/A:N/A] Periwound Skin No Abnormalities Noted N/A N/A Moisture: Periwound Skin Color: Erythema: Yes N/A N/A Erythema Location: Circumferential N/A N/A Temperature: No Abnormality N/A N/A Tenderness on Yes N/A N/A Palpation: Wound Preparation: Ulcer Cleansing: N/A N/A Rinsed/Irrigated with Saline Topical Anesthetic Applied: Other: lidocaine 4% Treatment Notes Wound #1 (Left, Lateral Malleolus) 1. Cleansed with: Clean wound with Normal Saline 2. Anesthetic Topical Lidocaine 4% cream to wound bed prior  to debridement 3. Peri-wound Care: Skin Prep 4. Dressing Applied: Hydrogel Other dressing (specify in notes) 5. Secondary Dressing Applied Bordered Foam Dressing Dry Gauze Notes sorbact Electronic Signature(s) Signed: 08/07/2016 3:22:24 PM By: Christin Fudge MD, FACS Entered By: Christin Fudge on 08/07/2016 15:22:23 [name], [name] ([id number]) -------------------------------------------------------------------------------- [place] Details Patient Name: [name], [name]. Date of Service: 08/07/2016 2:30 PM Medical Record Number: [id number] Patient Account Number: [id number] Date of Birth/Sex: [birth year] (82 y.o. Male) Treating RN: Ahmed Prima Primary Care Vicci Reder: [name] Other Clinician: Referring Shatha Hooser: [name] Treating Alfredo Spong/Extender: [name] in Treatment: 10 Active Inactive ` Abuse / Safety / Falls / Self Care Management Nursing Diagnoses: Potential for falls Goals: Patient will remain injury free Date Initiated: 05/29/2016 Target Resolution Date: 08/02/2016 Goal Status: Active Interventions: Assess fall risk on admission and as needed Assess impairment of mobility on admission and as needed per policy Notes: ` Nutrition Nursing Diagnoses: Imbalanced nutrition Goals: Patient/caregiver agrees to and verbalizes understanding of need to use nutritional supplements and/or vitamins as prescribed Date Initiated: 05/29/2016 Target Resolution Date: 08/02/2016 Goal Status: Active Interventions: Assess patient nutrition upon admission and as needed per policy Notes: ` Orientation to the Wound Care Program Nursing Diagnoses: [name], [name] ([id number]) Knowledge deficit related to the wound healing center program Goals: Patient/caregiver will verbalize understanding of the [facility] Date Initiated: 05/29/2016 Target Resolution Date: 06/14/2016 Goal Status: Active Interventions: Provide  education on orientation to the wound center Notes: ` Pain, Acute or Chronic Nursing Diagnoses: Pain, acute or chronic: actual or potential Potential alteration in comfort, pain Goals: Patient will verbalize adequate pain control and receive pain control interventions during procedures as needed Date Initiated: 05/29/2016 Target Resolution Date: 08/02/2016 Goal Status: Active Interventions: Complete pain assessment as per visit requirements Notes: ` Wound/Skin Impairment Nursing Diagnoses: Impaired tissue integrity Knowledge deficit related to ulceration/compromised skin integrity Goals: Ulcer/skin breakdown will have a volume reduction of 80% by week 12 Date Initiated: 05/29/2016 Target Resolution Date: 08/02/2016 Goal Status: Active Interventions: Assess patient/caregiver ability to perform ulcer/skin care regimen upon admission and as needed Assess ulceration(s) every visit Notes: [name], [name] ([id number]) Electronic Signature(s) Signed: 08/07/2016 5:04:07 PM By: Alric Quan Entered By: Alric Quan on 08/07/2016 14:47:00 [name] ([id number]) -------------------------------------------------------------------------------- Pain Assessment Details Patient Name: [name]. Date of Service: 08/07/2016 2:30 PM Medical Record Number: [id number] Patient Account Number: [id number] Date of Birth/Sex: 03-22-1935 (82 y.o. Male) Treating RN: Ahmed Prima Primary Care Chava Dulac: [name] Other Clinician: Referring Demyan Fugate: [name] Treating Brach Birdsall/Extender: Christin Fudge  Weeks in Treatment: 10 Active Problems Location of Pain Severity and Description of Pain Patient Has Paino No Site Locations With Dressing Change: No Pain Management and Medication Current Pain Management: Electronic Signature(s) Signed: 08/07/2016 5:04:07 PM By: Alric Quan Entered By: Alric Quan on 08/07/2016 14:36:52 [name]  ([id number]) -------------------------------------------------------------------------------- Patient/Caregiver Education Details Patient Name: [name], [name]. Date of Service: 08/07/2016 2:30 PM Medical Record Number: [id number] Patient Account Number: [id number] Date of Birth/Gender: 1934/11/26 (82 y.o. Male) Treating RN: Ahmed Prima Primary Care Physician: [name] Other Clinician: Referring Physician: [name] Treating Physician/Extender: [name] in Treatment: 10 Education Assessment Education Provided To: Patient Education Topics Provided Wound/Skin Impairment: Handouts: Other: change dressing as ordered Methods: Demonstration, Explain/Verbal Responses: State content correctly Electronic Signature(s) Signed: 08/07/2016 5:04:07 PM By: Alric Quan Entered By: Alric Quan on 08/07/2016 15:03:40 [name] ([id number]) -------------------------------------------------------------------------------- Wound Assessment Details Patient Name: [name], [name]. Date of Service: 08/07/2016 2:30 PM Medical Record Number: [id number] Patient Account Number: [id number] Date of Birth/Sex: 05-Oct-1934 (82 y.o. Male) Treating RN: Ahmed Prima Primary Care Jillienne Egner: [name] Other Clinician: Referring Elorah Dewing: [name] Treating Kara Mierzejewski/Extender: [name] in Treatment: 10 Wound Status Wound Number: 1 Primary Pressure Ulcer Etiology: Wound Location: Left Malleolus - Lateral Wound Open Wounding Event: Pressure Injury Status: Date Acquired: 05/01/2016 Comorbid Cataracts, Anemia, Sleep Apnea, Weeks Of Treatment: 10 History: Arrhythmia, Hypertension, Dementia Clustered Wound: No Photos Photo Uploaded By: Alric Quan on 08/07/2016 16:52:16 Wound Measurements Length: (cm) 0.4 Width: (cm) 0.4 Depth: (cm) 0.3 Area: (cm) 0.126 Volume: (cm) 0.038 % Reduction in Area: 35.7% % Reduction in Volume:  -90% Epithelialization: None Tunneling: No Undermining: No Wound Description Classification: Category/Stage II Foul Odor Aft Wound Margin: Distinct, outline attached Slough/Fibrin Exudate Amount: Large Exudate Type: Serous Exudate Color: amber er Cleansing: No o No Wound Bed Granulation Amount: Medium (34-66%) Exposed Structure Granulation Quality: Pink Fat Layer (Subcutaneous Tissue) Exposed: Yes Necrotic Amount: Medium (34-66%) Necrotic Quality: Eschar, [address], [place]. ([id number]) Periwound Skin Texture Texture Color No Abnormalities Noted: No No Abnormalities Noted: No Erythema: Yes Moisture Erythema Location: Circumferential No Abnormalities Noted: No Temperature / Pain Temperature: No Abnormality Tenderness on Palpation: Yes Wound Preparation Ulcer Cleansing: Rinsed/Irrigated with Saline Topical Anesthetic Applied: Other: lidocaine 4%, Treatment Notes Wound #1 (Left, Lateral Malleolus) 1. Cleansed with: Clean wound with Normal Saline 2. Anesthetic Topical Lidocaine 4% cream to wound bed prior to debridement 3. Peri-wound Care: Skin Prep 4. Dressing Applied: Hydrogel Other dressing (specify in notes) 5. Secondary Dressing Applied Bordered Foam Dressing Dry Gauze Notes sorbact Electronic Signature(s) Signed: 08/07/2016 5:04:07 PM By: Alric Quan Entered By: Alric Quan on 08/07/2016 14:44:49 [name] ([id number]) -------------------------------------------------------------------------------- [place] Details Patient Name: CIAN, COSTANZO. Date of Service: 08/07/2016 2:30 PM Medical Record Number: [id number] Patient Account Number: [id number] Date of Birth/Sex: [birth year] (82 y.o. Male) Treating RN: Ahmed Prima Primary Care Keiosha Cancro: [name] Other Clinician: Referring Ariez Neilan: [name] Treating Correne Lalani/Extender: [name] in Treatment: 10 Vital Signs Time Taken: 14:36 Pulse (bpm):  52 Height (in): 69 Respiratory Rate (breaths/min): 18 Weight (lbs): 174.8 Blood Pressure (mmHg): 132/54 Body Mass Index (BMI): 25.8 Reference Range: 80 - 120 mg / dl Electronic Signature(s) Signed: 08/07/2016 5:04:07 PM By: Alric Quan Entered By: Alric Quan on 08/07/2016 14:40:12

## 2016-08-12 ENCOUNTER — Ambulatory Visit: Payer: PPO | Admitting: Physical Therapy

## 2016-08-12 DIAGNOSIS — M6281 Muscle weakness (generalized): Secondary | ICD-10-CM | POA: Diagnosis not present

## 2016-08-12 DIAGNOSIS — R296 Repeated falls: Secondary | ICD-10-CM

## 2016-08-12 DIAGNOSIS — R2689 Other abnormalities of gait and mobility: Secondary | ICD-10-CM

## 2016-08-12 DIAGNOSIS — R293 Abnormal posture: Secondary | ICD-10-CM

## 2016-08-12 NOTE — Therapy (Signed)
[facility] [address]. [place], [place], [address] Phone: [phone]   Fax:  [phone]  Physical Therapy Treatment  Patient Details  Name: [name] MRN: [id number] Date of Birth: August 07, 1934 Referring Provider: Jennings Books, MD  Encounter Date: 08/12/2016      PT End of Session - 08/12/16 1705    Visit Number 16   Number of Visits 20   Date for PT Re-Evaluation 09/02/16   Authorization - Visit Number 16   Authorization - Number of Visits 22   PT Start Time 1245   PT Stop Time 1344   PT Time Calculation (min) 50 min   Equipment Utilized During Treatment Gait belt   Activity Tolerance Patient tolerated treatment well;Patient limited by pain;Patient limited by fatigue   Behavior During Therapy [facility] for tasks assessed/performed      Past Medical History:  Diagnosis Date  . Absolute anemia 09/11/2014  . Anxiety   . Atrial fibrillation ([place]) 01/23/2014  . BP (high blood pressure) 11/07/2013  . Carotid artery disease ([place]) 09/11/2014  . Complete rotator cuff rupture of left shoulder 12/30/2013  . Dementia 09/11/2014  . Depression   . Heart murmur   . HLD (hyperlipidemia) 01/23/2014  . Hypertension   . Obstructive apnea 11/07/2013  . Personal history of other diseases of the circulatory system 01/23/2014  . Sleep apnea   . 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 Back Surgery- Tumor Removal    . SHOULDER SURGERY Right 2007    There were no vitals filed for this visit.      Subjective Assessment - 08/12/16 1703    Subjective Patient continues to have carryover problems at home per wife report often complaining of fatigue. Pt. having increased back pain.    Patient is accompained by: Family member   Pertinent History Hx of left knee pain, low back pain, shoulder pain. Hx of falls, dementia, and teretoma of the spine   Limitations  Sitting;Lifting;Standing;Walking;House hold activities   Diagnostic tests BERG   Patient Stated Goals get stronger, pick up legs better when walking, getting up easier and faster   Currently in Pain? Yes   Pain Score 3    Pain Location Back   Pain Orientation Lower     TherEx Seated scapular retractions 2x15 Forward backwards standing clap Hip forward and backward taps with cues for upright posture Prone prop press 10x 5-10 second holds 2 1/2 lb ankle weight: knee extension 15x each leg with 3 second holds at top Seated posture with towel Ambulating 48 ft with cane and good control  Stepping over 3" raised and flat on ground alternating planks with good control in // bars and only occasional foot scuff.    Manual: Prone: STM gluteal region (R tender to palpation) CPA's UPAs thoracolumbar spine, hypomobile, tender to grade I Sacral herniation inspection.   Pt. Response to medical necessity: patient has demonstrated improved strength and balance but continues to have deficits affecting activities of daily living, ambulatory abilities and posture. Patient will benefit from continued skilled physical therapy to improve safe ambulation and quality of life.         PT Education - 08/12/16 1704    Education provided Yes   Education Details laying prone, discussing carryover and potential change of POC   Person(s) Educated Patient;Spouse   Methods Explanation;Demonstration;Handout   Comprehension Verbalized understanding;Returned demonstration  PT Long Term Goals - 08/05/16 1401      PT LONG TERM GOAL #1   Title Patient will increase score on Berg Balance to 49/56 for decreased fall risk.    Baseline 3/7: 50/56 ; 2/8: 44/56   Time 4   Period Weeks   Status Achieved     PT LONG TERM GOAL #2   Title Patient will have gross 4/5 bilateral LE strength to increase ability to perform functional activities.    Baseline Gross LLE 4-/5 knee flexion 3+/5, RLE 4/5    Time 4   Period Weeks   Status On-going     PT LONG TERM GOAL #3   Title Patient will ambulate 5 minutes with a cane without LOB to increase community mobility.     Baseline Patient able to walk 5 minutes with cane but with poor posture.    Time 4   Period Weeks   Status Achieved     PT LONG TERM GOAL #4   Title Patient will perform 10 sit to stands without use of hands or LOB to demonstrate improved capacity for functional activities.    Baseline Patient can perform 10 sit to stands with no hands with decreased eccentric control with fatigue.    Time 4   Period Weeks   Status Achieved     PT LONG TERM GOAL #5   Title Patient will have upright posture for 10 minutes to allow for better body mechanics and decreased back pain.    Baseline Frequent cues required for postural control in standing and seated. Less cues required than before.    Time 4   Period Weeks   Status On-going     Additional Long Term Goals   Additional Long Term Goals Yes     PT LONG TERM GOAL #6   Title Patient will not have SI/back pain while ambulating to allow for increased community mobility.    Baseline Patient has occasional 4/10 back pain while ambulating   Time 4   Period Weeks   Status On-going     PT LONG TERM GOAL #7   Title Patient will perform 10 sit to stands from surface lower than PT room chairs to increase independence in home setting due to low height of furniture at home.    Baseline Patient able to perform 10 STS from low surface with UE   Time 4   Period Weeks   Status Achieved     PT LONG TERM GOAL #8   Title Patient will score a 52/56 on Berg to decrease fall risk to minimal and increase single limb abilities to improve carryover to household activities such as stepping into shower.    Baseline 3/7: 50/56, extreme difficulty stepping into shower 4/3: 49/56 (fatigued today)   Time 4   Period Weeks   Status On-going     PT LONG TERM GOAL  #9   TITLE Patient will demonstrate  independence in home postural program to promote improved control of posture in seated and standing for progression to home based program.    Baseline Pt. was given new postural control program   Time 4   Period Weeks   Status New               Plan - 08/12/16 1711    Clinical Impression Statement Patient is having increased pain in low back and was positioned into prone for further inspection. Sacral scarring and bulge noted with trunk flexion as well  as additional right gluteal/posterior hip pain when palpated. Bulging/herniation worsened by poor posture and pt. and pt.'s wife educated on prone propping. Postural exercises educated on and performed in session and pt. and wife promise to try to improve carryover at home. Will reassess carryover next week to determine if change of POC is required. Patient will continue to benefit from skilled physical therapy at this time to improve gross strength, decrease fall risk, improve postural control and ambulatory capacity for improved safe ambulation in community for better quality of life   Rehab Potential Fair   Clinical Impairments Affecting Rehab Potential hx of falls, dementia, teratoma, mitral valve polapse, cancer, a fib, L knee injury, L shoulder, back pain   PT Frequency 1x / week   PT Duration 4 weeks   PT Treatment/Interventions ADLs/Self Care Home Management;Aquatic Therapy;Cryotherapy;Electrical Stimulation;Biofeedback;Moist Heat;DME Instruction;Gait training;Stair training;Functional mobility training;Therapeutic activities;Therapeutic exercise;Balance training;Neuromuscular re-education;Patient/family education;Orthotic Fit/Training;Manual techniques;Passive range of motion;Dry needling;Energy conservation   PT Next Visit Plan review POC. assess carryover   PT Home Exercise Plan see sheet   Consulted and Agree with Plan of Care Patient;Family member/caregiver   Family Member Consulted wife      Patient will benefit from skilled  therapeutic intervention in order to improve the following deficits and impairments:  Abnormal gait, Decreased activity tolerance, Decreased balance, Decreased mobility, Decreased knowledge of use of DME, Decreased endurance, Decreased range of motion, Decreased safety awareness, Decreased skin integrity, Decreased knowledge of precautions, Decreased strength, Hypomobility, Difficulty walking, Impaired flexibility, Postural dysfunction, Improper body mechanics, Decreased cognition  Visit Diagnosis: Muscle weakness (generalized)  Other abnormalities of gait and mobility  Repeated falls  Abnormal posture     Problem List Patient Active Problem List   Diagnosis Date Noted  . Fall 02/18/2016  . Scalp laceration 02/18/2016  . Splenic laceration 02/18/2016  . Acute blood loss anemia 02/18/2016  . Multiple rib fractures 02/16/2016  . Mild dementia 04/10/2015  . Aggrieved 03/02/2015  . Absolute anemia 09/11/2014  . Carotid artery disease ([place]) 09/11/2014  . Dementia 09/11/2014  . History of repair of rotator cuff 03/02/2014  . Atrial fibrillation with RVR ([place]) 01/23/2014  . Personal history of other diseases of the circulatory system 01/23/2014  . HLD (hyperlipidemia) 01/23/2014  . Complete rotator cuff rupture of left shoulder 12/30/2013  . BP (high blood pressure) 11/07/2013  . Amnesia 11/07/2013  . Obstructive apnea 11/07/2013   This entire session was performed under direct supervision and direction of a licensed therapist/therapist assistant. I have personally read, edited and approve of the note as written.  Pura Spice, PT, DPT # [id number] Janna Arch, SPT 08/12/2016, 5:13 PM  [facility] [address] [place], [place], [address] Phone: [phone]   Fax:  [phone]  Name: [name] MRN: [id number] Date of Birth: December 17, 1934

## 2016-08-13 ENCOUNTER — Encounter: Payer: Self-pay | Admitting: Physical Therapy

## 2016-08-14 ENCOUNTER — Ambulatory Visit: Payer: PPO | Admitting: Physical Therapy

## 2016-08-14 ENCOUNTER — Encounter: Payer: PPO | Admitting: Surgery

## 2016-08-14 DIAGNOSIS — Z8659 Personal history of other mental and behavioral disorders: Secondary | ICD-10-CM | POA: Diagnosis not present

## 2016-08-14 DIAGNOSIS — I482 Chronic atrial fibrillation: Secondary | ICD-10-CM | POA: Diagnosis not present

## 2016-08-14 DIAGNOSIS — F0280 Dementia in other diseases classified elsewhere without behavioral disturbance: Secondary | ICD-10-CM | POA: Diagnosis not present

## 2016-08-14 DIAGNOSIS — G309 Alzheimer's disease, unspecified: Secondary | ICD-10-CM | POA: Diagnosis not present

## 2016-08-14 DIAGNOSIS — G4733 Obstructive sleep apnea (adult) (pediatric): Secondary | ICD-10-CM | POA: Diagnosis not present

## 2016-08-14 DIAGNOSIS — R2681 Unsteadiness on feet: Secondary | ICD-10-CM | POA: Diagnosis not present

## 2016-08-14 DIAGNOSIS — F0150 Vascular dementia without behavioral disturbance: Secondary | ICD-10-CM | POA: Diagnosis not present

## 2016-08-14 DIAGNOSIS — L89522 Pressure ulcer of left ankle, stage 2: Secondary | ICD-10-CM | POA: Diagnosis not present

## 2016-08-16 NOTE — Progress Notes (Signed)
Jared, Ho ([id number]) Visit Report for 08/14/2016 Chief Complaint Document Details Patient Name: [name], [name]. Date of Service: 08/14/2016 3:30 PM Medical Record Number: [id number] Patient Account Number: [id number] Date of Birth/Sex: Aug 12, 1934 (82 y.o. Male) Treating RN: Ahmed Prima Primary Care Provider: [name] Other Clinician: Referring Provider: [name] Treating Provider/Extender: [name] in Treatment: 11 Information Obtained from: Patient Chief Complaint Patient is at the clinic for treatment of an open pressure ulcer to the left ankle which she's had for about 5 weeks Electronic Signature(s) Signed: 08/14/2016 3:42:56 PM By: Christin Fudge MD, FACS Entered By: Christin Fudge on 08/14/2016 15:42:56 [name] ([id number]) -------------------------------------------------------------------------------- Debridement Details Patient Name: Jared, Ho. Date of Service: 08/14/2016 3:30 PM Medical Record Number: [id number] Patient Account Number: [id number] Date of Birth/Sex: 1934/09/26 (82 y.o. Male) Treating RN: Ahmed Prima Primary Care Provider: [name] Other Clinician: Referring Provider: [name] Treating Provider/Extender: [name] in Treatment: 11 Debridement Performed for Wound #1 Left,Lateral Malleolus Assessment: Performed By: Physician Christin Fudge, MD Debridement: Debridement Pre-procedure Yes - 15:28 Verification/Time Out Taken: Start Time: 15:29 Pain Control: Lidocaine 4% Topical Solution Level: Skin/Subcutaneous Tissue Total Area Debrided (L x 0.3 (cm) x 0.3 (cm) = 0.09 (cm) W): Tissue and other Viable, Non-Viable, Exudate, Fibrin/Slough, Subcutaneous material debrided: Instrument: Forceps Bleeding: Minimum Hemostasis Achieved: Pressure End Time: 15:31 Procedural Pain: 0 Post Procedural Pain: 0 Response to Treatment: Procedure was tolerated well Post Debridement  Measurements of Total Wound Length: (cm) 0.3 Stage: Category/Stage II Width: (cm) 0.3 Depth: (cm) 0.3 Volume: (cm) 0.021 Character of Wound/Ulcer Post Requires Further Debridement: Debridement Severity of Tissue Post Fat layer exposed Debridement: Post Procedure Diagnosis Same as Pre-procedure Electronic Signature(s) Signed: 08/14/2016 3:42:49 PM By: Christin Fudge MD, FACS Signed: 08/14/2016 4:33:22 PM By: Alric Quan Previous Signature: 08/14/2016 3:42:26 PM Version By: Christin Fudge MD, FACS RIYAD, KEENA ([id number]) Entered By: Christin Fudge on 08/14/2016 15:42:49 ARISTIDES, LUCKEY ([id number]) -------------------------------------------------------------------------------- HPI Details Patient Name: Jared, Ho. Date of Service: 08/14/2016 3:30 PM Medical Record Number: [id number] Patient Account Number: [id number] Date of Birth/Sex: 1935/01/18 (82 y.o. Male) Treating RN: Ahmed Prima Primary Care Provider: [name] Other Clinician: Referring Provider: [name] Treating Provider/Extender: [name] in Treatment: 11 History of Present Illness Location: left ankle laterally Quality: Patient reports experiencing a dull pain to affected area(s). Severity: Patient states wound are getting worse. Duration: Patient has had the wound for < 5 weeks prior to presenting for treatment Timing: Pain in wound is Intermittent (comes and goes Context: The wound appeared gradually over time Modifying Factors: Other treatment(s) tried include:put on Bactroban and doxycycline Associated Signs and Symptoms: Patient reports having difficulty standing for long periods. HPI Description: 82 year old gentleman who is known to have a history of dementia but is fairly functional and is able to ambulate has been having a pressure ulcer to the left ankle due to laying in bed for longer than he should. Past history of atrial fibrillation, dementia, hyperlipidemia,  hypertension. He's also had some orthopedic related surgeries in the past and was a former smoker and is given up in 1969. Recently seen by his PCP who put him on doxycycline and asked him to apply Bactroban and see [place] at the wound clinic. 06/05/2016 -- had a x-ray of the left ankle which showed no acute bony pathology. Electronic Signature(s) Signed: 08/14/2016 3:43:00 PM By: Christin Fudge MD, FACS Entered By: Christin Fudge on 08/14/2016 15:43:00 Schiller,  [name] ([id number]) -------------------------------------------------------------------------------- Physical Exam Details Patient Name: Jared, Ho. Date of Service: 08/14/2016 3:30 PM Medical Record Number: [id number] Patient Account Number: [id number] Date of Birth/Sex: 22-Apr-1935 (82 y.o. Male) Treating RN: Ahmed Prima Primary Care Provider: [name] Other Clinician: Referring Provider: [name] Treating Provider/Extender: [name] in Treatment: 11 Constitutional . Pulse regular. Respirations normal and unlabored. Afebrile. . Eyes Nonicteric. Reactive to light. Ears, Nose, Mouth, and Throat Lips, teeth, and gums WNL.Marland Kitchen Moist mucosa without lesions. Neck supple and nontender. No palpable supraclavicular or cervical adenopathy. Normal sized without goiter. Respiratory WNL. No retractions.. Breath sounds WNL, No rubs, rales, rhonchi, or wheeze.. Cardiovascular Heart rhythm and rate regular, no murmur or gallop.. Pedal Pulses WNL. No clubbing, cyanosis or edema. Chest Breasts symmetical and no nipple discharge.. Breast tissue WNL, no masses, lumps, or tenderness.. Lymphatic No adneopathy. No adenopathy. No adenopathy. Musculoskeletal Adexa without tenderness or enlargement.. Digits and nails w/o clubbing, cyanosis, infection, petechiae, ischemia, or inflammatory conditions.. Integumentary (Hair, Skin) No suspicious lesions. No crepitus or fluctuance. No peri-wound warmth or erythema. No  masses.Marland Kitchen Psychiatric Judgement and insight Intact.. No evidence of depression, anxiety, or agitation.. Notes the wound is looking better overall and the base of the ulcer was sharply debrided with a toothed forcep and minimal bleeding controlled with pressure it does not probe down to bone. Electronic Signature(s) Signed: 08/14/2016 3:43:38 PM By: Christin Fudge MD, FACS Entered By: Christin Fudge on 08/14/2016 15:43:38 [name] ([id number]) -------------------------------------------------------------------------------- Physician Orders Details Patient Name: DONALDSON, RICHTER. Date of Service: 08/14/2016 3:30 PM Medical Record Number: [id number] Patient Account Number: [id number] Date of Birth/Sex: [birth year] (82 y.o. Male) Treating RN: Ahmed Prima Primary Care Provider: [name] Other Clinician: Referring Provider: [name] Treating Provider/Extender: [name] in Treatment: 10 Verbal / Phone Orders: Yes Clinician: Carolyne Fiscal, Debi Read Back and Verified: Yes Diagnosis Coding Wound Cleansing Wound #1 Left,Lateral Malleolus o Clean wound with Normal Saline. o May Shower, gently pat wound dry prior to applying new dressing. Anesthetic Wound #1 Left,Lateral Malleolus o Topical Lidocaine 4% cream applied to wound bed prior to debridement - for clinic use Skin Barriers/Peri-Wound Care Wound #1 Left,Lateral Malleolus o Skin Prep Primary Wound Dressing Wound #1 Left,Lateral Malleolus o Hydrogel o Other: - sorbact Secondary Dressing Wound #1 Left,Lateral Malleolus o Dry Gauze o Boardered Foam Dressing Follow-up Appointments Wound #1 Left,Lateral Malleolus o Return Appointment in 1 week. Edema Control Wound #1 Left,Lateral Malleolus o Elevate legs to the level of the heart and pump ankles as often as possible Off-Loading Wound #1 Left,Lateral Malleolus o Turn and reposition every 2 hours o Other: - keep pressure off of  affected area CORIAN, HANDLEY. ([id number]) Additional Orders / Instructions Wound #1 Left,Lateral Malleolus o Increase protein intake. Medications-please add to medication list. Wound #1 Left,Lateral Malleolus o Other: - Vitamin A, Vitamin C, Zinc, MVI Electronic Signature(s) Signed: 08/14/2016 4:32:02 PM By: Christin Fudge MD, FACS Signed: 08/14/2016 4:33:22 PM By: Alric Quan Entered By: Alric Quan on 08/14/2016 15:32:00 [name] ([id number]) -------------------------------------------------------------------------------- Problem List Details Patient Name: HESSTON, HITCHENS. Date of Service: 08/14/2016 3:30 PM Medical Record Number: [id number] Patient Account Number: [id number] Date of Birth/Sex: [birth year] (82 y.o. Male) Treating RN: Ahmed Prima Primary Care Provider: [name] Other Clinician: Referring Provider: [name] Treating Provider/Extender: [name] in Treatment: 11 Active Problems ICD-10 Encounter Code Description Active Date Diagnosis [id number] Pressure ulcer of left ankle, stage 2 05/29/2016  Yes F02.80 Dementia in other diseases classified elsewhere without 05/29/2016 Yes behavioral disturbance Inactive Problems Resolved Problems Electronic Signature(s) Signed: 08/14/2016 3:42:08 PM By: Christin Fudge MD, FACS Entered By: Christin Fudge on 08/14/2016 15:42:07 [name] ([id number]) -------------------------------------------------------------------------------- Progress Note Details Patient Name: BISHOY, CUPP. Date of Service: 08/14/2016 3:30 PM Medical Record Number: [id number] Patient Account Number: [id number] Date of Birth/Sex: 1934-10-12 (82 y.o. Male) Treating RN: Ahmed Prima Primary Care Provider: [name] Other Clinician: Referring Provider: [name] Treating Provider/Extender: [name] in Treatment: 11 Subjective Chief Complaint Information obtained from  Patient Patient is at the clinic for treatment of an open pressure ulcer to the left ankle which she's had for about 5 weeks History of Present Illness (HPI) The following HPI elements were documented for the patient's wound: Location: left ankle laterally Quality: Patient reports experiencing a dull pain to affected area(s). Severity: Patient states wound are getting worse. Duration: Patient has had the wound for < 5 weeks prior to presenting for treatment Timing: Pain in wound is Intermittent (comes and goes Context: The wound appeared gradually over time Modifying Factors: Other treatment(s) tried include:put on Bactroban and doxycycline Associated Signs and Symptoms: Patient reports having difficulty standing for long periods. 82 year old gentleman who is known to have a history of dementia but is fairly functional and is able to ambulate has been having a pressure ulcer to the left ankle due to laying in bed for longer than he should. Past history of atrial fibrillation, dementia, hyperlipidemia, hypertension. He's also had some orthopedic related surgeries in the past and was a former smoker and is given up in 1969. Recently seen by his PCP who put him on doxycycline and asked him to apply Bactroban and see [place] at the wound clinic. 06/05/2016 -- had a x-ray of the left ankle which showed no acute bony pathology. Objective Constitutional Pulse regular. Respirations normal and unlabored. Afebrile. Vitals Time Taken: 3:19 PM, Height: 69 in, Weight: 174.8 lbs, BMI: 25.8, Temperature: 98.2 F, Pulse: 55 bpm, Respiratory Rate: 18 breaths/min, Blood Pressure: 118/82 mmHg. HULET, EHRMANN. ([id number]) Eyes Nonicteric. Reactive to light. Ears, Nose, Mouth, and Throat Lips, teeth, and gums WNL.Marland Kitchen Moist mucosa without lesions. Neck supple and nontender. No palpable supraclavicular or cervical adenopathy. Normal sized without goiter. Respiratory WNL. No retractions.. Breath sounds WNL, No  rubs, rales, rhonchi, or wheeze.. Cardiovascular Heart rhythm and rate regular, no murmur or gallop.. Pedal Pulses WNL. No clubbing, cyanosis or edema. Chest Breasts symmetical and no nipple discharge.. Breast tissue WNL, no masses, lumps, or tenderness.. Lymphatic No adneopathy. No adenopathy. No adenopathy. Musculoskeletal Adexa without tenderness or enlargement.. Digits and nails w/o clubbing, cyanosis, infection, petechiae, ischemia, or inflammatory conditions.Marland Kitchen Psychiatric Judgement and insight Intact.. No evidence of depression, anxiety, or agitation.. General Notes: the wound is looking better overall and the base of the ulcer was sharply debrided with a toothed forcep and minimal bleeding controlled with pressure it does not probe down to bone. Integumentary (Hair, Skin) No suspicious lesions. No crepitus or fluctuance. No peri-wound warmth or erythema. No masses.. Wound #1 status is Open. Original cause of wound was Pressure Injury. The wound is located on the Left,Lateral Malleolus. The wound measures 0.3cm length x 0.3cm width x 0.2cm depth; 0.071cm^2 area and 0.014cm^3 volume. There is Fat Layer (Subcutaneous Tissue) Exposed exposed. There is no tunneling or undermining noted. There is a large amount of serous drainage noted. The wound margin is distinct with the outline attached to the  wound base. There is medium (34-66%) pink granulation within the wound bed. There is a medium (34-66%) amount of necrotic tissue within the wound bed including Eschar and Adherent Slough. The periwound skin appearance exhibited: Erythema. The surrounding wound skin color is noted with erythema which is circumferential. Periwound temperature was noted as No Abnormality. The periwound has tenderness on palpation. Assessment KAHLEB, MCCLANE ([id number]) Active Problems ICD-10 [phone] - Pressure ulcer of left ankle, stage 2 F02.80 - Dementia in other diseases classified elsewhere without  behavioral disturbance Procedures Wound #1 Wound #1 is a Pressure Ulcer located on the Left,Lateral Malleolus . There was a Skin/Subcutaneous Tissue Debridement ([id number]) debridement with total area of 0.09 sq cm performed by Christin Fudge, MD. with the following instrument(s): Forceps to remove Viable and Non-Viable tissue/material including Exudate, Fibrin/Slough, and Subcutaneous after achieving pain control using Lidocaine 4% Topical Solution. A time out was conducted at 15:28, prior to the start of the procedure. A Minimum amount of bleeding was controlled with Pressure. The procedure was tolerated well with a pain level of 0 throughout and a pain level of 0 following the procedure. Post Debridement Measurements: 0.3cm length x 0.3cm width x 0.3cm depth; 0.021cm^3 volume. Post debridement Stage noted as Category/Stage II. Character of Wound/Ulcer Post Debridement requires further debridement. Severity of Tissue Post Debridement is: Fat layer exposed. Post procedure Diagnosis Wound #1: Same as Pre-Procedure Plan Wound Cleansing: Wound #1 Left,Lateral Malleolus: Clean wound with Normal Saline. May Shower, gently pat wound dry prior to applying new dressing. Anesthetic: Wound #1 Left,Lateral Malleolus: Topical Lidocaine 4% cream applied to wound bed prior to debridement - for clinic use Skin Barriers/Peri-Wound Care: Wound #1 Left,Lateral Malleolus: Skin Prep Primary Wound Dressing: Wound #1 Left,Lateral Malleolus: Hydrogel Other: - sorbact Secondary Dressing: Wound #1 Left,Lateral Malleolus: Dry Gauze ZAHIR, EISENHOUR ([id number]) Boardered Foam Dressing Follow-up Appointments: Wound #1 Left,Lateral Malleolus: Return Appointment in 1 week. Edema Control: Wound #1 Left,Lateral Malleolus: Elevate legs to the level of the heart and pump ankles as often as possible Off-Loading: Wound #1 Left,Lateral Malleolus: Turn and reposition every 2 hours Other: - keep pressure off  of affected area Additional Orders / Instructions: Wound #1 Left,Lateral Malleolus: Increase protein intake. Medications-please add to medication list.: Wound #1 Left,Lateral Malleolus: Other: - Vitamin A, Vitamin C, Zinc, MVI I have recommended: 1. Sorbact with hydrogel to be placed into the wound and a protective bordered foam. 2. Off-Loading has been discussed in great detail 3. adequate protein, vitamin A, vitamin C and zinc 4. Regular visits to the wound center. Electronic Signature(s) Signed: 08/14/2016 3:44:03 PM By: Christin Fudge MD, FACS Entered By: Christin Fudge on 08/14/2016 15:44:03 [name] ([id number]) -------------------------------------------------------------------------------- SuperBill Details Patient Name: LAVORIS, SPARLING. Date of Service: 08/14/2016 Medical Record Number: [id number] Patient Account Number: [id number] Date of Birth/Sex: [birth year] (82 y.o. Male) Treating RN: Ahmed Prima Primary Care Provider: [name] Other Clinician: Referring Provider: [name] Treating Provider/Extender: [name] in Treatment: 11 Diagnosis Coding ICD-10 Codes Code Description [phone] Pressure ulcer of left ankle, stage 2 F02.80 Dementia in other diseases classified elsewhere without behavioral disturbance Facility Procedures CPT4: Description Modifier Quantity Code [id number] 11042 - DEB SUBQ TISSUE 20 SQ CM/< 1 ICD-10 Description Diagnosis L89.522 Pressure ulcer of left ankle, stage 2 F02.80 Dementia in other diseases classified elsewhere without behavioral disturbance Physician Procedures CPT4: Description Modifier Quantity Code [id number] 11042 - WC PHYS SUBQ TISS 20 SQ CM 1 ICD-10 Description Diagnosis L89.522 Pressure ulcer of  left ankle, stage 2 F02.80 Dementia in other diseases classified elsewhere without behavioral disturbance Electronic Signature(s) Signed: 08/14/2016 3:44:17 PM By: Christin Fudge MD, FACS Entered By: Christin Fudge  on 08/14/2016 15:44:16

## 2016-08-16 NOTE — Progress Notes (Signed)
ARMAAN, POND ([id number]) Visit Report for 08/14/2016 Arrival Information Details Patient Name: [name], [name]. Date of Service: 08/14/2016 3:30 PM Medical Record Number: [id number] Patient Account Number: [id number] Date of Birth/Sex: 02/05/1935 (82 y.o. Male) Treating RN: Ahmed Prima Primary Care Cloie Wooden: [name] Other Clinician: Referring Maddux First: [name] Treating Mikaelyn Arthurs/Extender: [name] in Treatment: 11 Visit Information History Since Last Visit All ordered tests and consults were completed: No Patient Arrived: [name] Added or deleted any medications: No Arrival Time: 15:14 Any new allergies or adverse reactions: No Accompanied By: wife Had a fall or experienced change in No Transfer Assistance: EasyPivot activities of daily living that may affect Patient Lift risk of falls: Patient Identification Verified: Yes Signs or symptoms of abuse/neglect since last No Secondary Verification Process Yes visito Completed: Hospitalized since last visit: No Patient Requires Transmission- No Has Dressing in Place as Prescribed: Yes Based Precautions: Pain Present Now: No Patient Has Alerts: Yes Patient Alerts: ASA Electronic Signature(s) Signed: 08/14/2016 4:33:22 PM By: Alric Quan Entered By: Alric Quan on 08/14/2016 15:19:31 [name] ([id number]) -------------------------------------------------------------------------------- Encounter Discharge Information Details Patient Name: [name], [name]. Date of Service: 08/14/2016 3:30 PM Medical Record Number: [id number] Patient Account Number: [id number] Date of Birth/Sex: Oct 26, 1934 (82 y.o. Male) Treating RN: Ahmed Prima Primary Care Betsi Crespi: [name] Other Clinician: Referring Elam Ellis: [name] Treating Charnice Zwilling/Extender: [name] in Treatment: 11 Encounter Discharge Information Items Discharge Pain Level: 0 Discharge Condition:  Stable Ambulatory Status: Cane Discharge Destination: Home Transportation: Private Auto Accompanied By: wife Schedule Follow-up Appointment: Yes Medication Reconciliation completed No and provided to Patient/Care Domanic Matusek: Provided on Clinical Summary of Care: 08/14/2016 Form Type Recipient Paper Patient ET Electronic Signature(s) Signed: 08/14/2016 3:45:18 PM By: Ruthine Dose Entered By: Ruthine Dose on 08/14/2016 15:45:17 [name] ([id number]) -------------------------------------------------------------------------------- Lower Extremity Assessment Details Patient Name: [name]. Date of Service: 08/14/2016 3:30 PM Medical Record Number: [id number] Patient Account Number: [id number] Date of Birth/Sex: 06/24/1934 (82 y.o. Male) Treating RN: Ahmed Prima Primary Care Genasis Zingale: [name] Other Clinician: Referring Kenyatta Keidel: [name] Treating Emmanuel Gruenhagen/Extender: [name] in Treatment: 11 Vascular Assessment Pulses: Dorsalis Pedis Palpable: [Left:Yes] Posterior Tibial Extremity colors, hair growth, and conditions: Extremity Color: [Left:Normal] Temperature of Extremity: [Left:Warm] Capillary Refill: [Left:< 3 seconds] Toe Nail Assessment Left: Right: Thick: No Discolored: No Deformed: No Improper Length and Hygiene: Yes Electronic Signature(s) Signed: 08/14/2016 4:33:22 PM By: Alric Quan Entered By: Alric Quan on 08/14/2016 15:28:36 [name] ([id number]) -------------------------------------------------------------------------------- Multi Wound Chart Details Patient Name: [name]. Date of Service: 08/14/2016 3:30 PM Medical Record Number: [id number] Patient Account Number: [id number] Date of Birth/Sex: 1934/05/11 (82 y.o. Male) Treating RN: Ahmed Prima Primary Care Silverio Hagan: [name] Other Clinician: Referring Ares Tegtmeyer: [name] Treating Tymeir Weathington/Extender: [name]  in Treatment: 11 Vital Signs Height(in): 69 Pulse(bpm): 55 Weight(lbs): 174.8 Blood Pressure 118/82 (mmHg): Body Mass Index(BMI): 26 Temperature(F): 98.2 Respiratory Rate 18 (breaths/min): Photos: [1:No Photos] [N/A:N/A] Wound Location: [1:Left Malleolus - Lateral] [N/A:N/A] Wounding Event: [1:Pressure Injury] [N/A:N/A] Primary Etiology: [1:Pressure Ulcer] [N/A:N/A] Comorbid History: [1:Cataracts, Anemia, Sleep Apnea, Arrhythmia, Hypertension, Dementia] [N/A:N/A] Date Acquired: [1:05/01/2016] [N/A:N/A] Weeks of Treatment: [1:11] [N/A:N/A] Wound Status: [1:Open] [N/A:N/A] Measurements L x W x D 0.3x0.3x0.2 [N/A:N/A] (cm) Area (cm) : [1:0.071] [N/A:N/A] Volume (cm) : [1:0.014] [N/A:N/A] % Reduction in Area: [1:63.80%] [N/A:N/A] % Reduction in Volume: 30.00% [N/A:N/A] Classification: [1:Category/Stage II] [N/A:N/A] Exudate Amount: [1:Large] [N/A:N/A] Exudate Type: [1:Serous] [N/A:N/A] Exudate  Color: [1:amber] [N/A:N/A] Wound Margin: [1:Distinct, outline attached] [N/A:N/A] Granulation Amount: [1:Medium (34-66%)] [N/A:N/A] Granulation Quality: [1:Pink] [N/A:N/A] Necrotic Amount: [1:Medium (34-66%)] [N/A:N/A] Necrotic Tissue: [1:Eschar, Adherent Slough] [N/A:N/A] Exposed Structures: [1:Fat Layer (Subcutaneous Tissue) Exposed: Yes] [N/A:N/A] Epithelialization: [1:None] [N/A:N/A] Debridement: [N/A:N/A] Debridement ([id number]) Pre-procedure 15:28 N/A N/A Verification/Time Out Taken: Pain Control: Lidocaine 4% Topical N/A N/A Solution Tissue Debrided: Fibrin/Slough, Exudates, N/A N/A Subcutaneous Level: Skin/Subcutaneous N/A N/A Tissue Debridement Area (sq 0.09 N/A N/A cm): Instrument: Forceps N/A N/A Bleeding: Minimum N/A N/A Hemostasis Achieved: Pressure N/A N/A Procedural Pain: 0 N/A N/A Post Procedural Pain: 0 N/A N/A Debridement Treatment Procedure was tolerated N/A N/A Response: well Post Debridement 0.3x0.3x0.3 N/A N/A Measurements L x W x  D (cm) Post Debridement 0.021 N/A N/A Volume: (cm) Post Debridement Category/Stage II N/A N/A Stage: Periwound Skin Texture: No Abnormalities Noted N/A N/A Periwound Skin No Abnormalities Noted N/A N/A Moisture: Periwound Skin Color: Erythema: Yes N/A N/A Erythema Location: Circumferential N/A N/A Temperature: No Abnormality N/A N/A Tenderness on Yes N/A N/A Palpation: Wound Preparation: Ulcer Cleansing: N/A N/A Rinsed/Irrigated with Saline Topical Anesthetic Applied: Other: lidocaine 4% Procedures Performed: Debridement N/A N/A Treatment Notes Wound #1 (Left, Lateral Malleolus) 1. Cleansed with: Clean wound with Normal Saline 2. Anesthetic [name], [name]. ([id number]) Topical Lidocaine 4% cream to wound bed prior to debridement 3. Peri-wound Care: Skin Prep 4. Dressing Applied: Hydrogel Other dressing (specify in notes) 5. Secondary Dressing Applied Bordered Foam Dressing Dry Gauze Notes sorbact Electronic Signature(s) Signed: 08/14/2016 3:42:16 PM By: Christin Fudge MD, FACS Entered By: Christin Fudge on 08/14/2016 15:42:15 [name] ([id number]) -------------------------------------------------------------------------------- [place] Details Patient Name: [name], [name]. Date of Service: 08/14/2016 3:30 PM Medical Record Number: [id number] Patient Account Number: [id number] Date of Birth/Sex: March 31, 1935 (82 y.o. Male) Treating RN: Ahmed Prima Primary Care Justen Fonda: [name] Other Clinician: Referring Hiroko Tregre: [name] Treating Javayah Magaw/Extender: [name] in Treatment: 11 Active Inactive ` Abuse / Safety / Falls / [name] Care Management Nursing Diagnoses: Potential for falls Goals: Patient will remain injury free Date Initiated: 05/29/2016 Target Resolution Date: 08/02/2016 Goal Status: Active Interventions: Assess fall risk on admission and as needed Assess impairment of mobility on admission  and as needed per policy Notes: ` Nutrition Nursing Diagnoses: Imbalanced nutrition Goals: Patient/caregiver agrees to and verbalizes understanding of need to use nutritional supplements and/or vitamins as prescribed Date Initiated: 05/29/2016 Target Resolution Date: 08/02/2016 Goal Status: Active Interventions: Assess patient nutrition upon admission and as needed per policy Notes: ` Orientation to the Wound Care Program Nursing Diagnoses: TAJEE, SAVANT ([id number]) Knowledge deficit related to the wound healing center program Goals: Patient/caregiver will verbalize understanding of the [place] Date Initiated: 05/29/2016 Target Resolution Date: 06/14/2016 Goal Status: Active Interventions: Provide education on orientation to the wound center Notes: ` Pain, Acute or Chronic Nursing Diagnoses: Pain, acute or chronic: actual or potential Potential alteration in comfort, pain Goals: Patient will verbalize adequate pain control and receive pain control interventions during procedures as needed Date Initiated: 05/29/2016 Target Resolution Date: 08/02/2016 Goal Status: Active Interventions: Complete pain assessment as per visit requirements Notes: ` Wound/Skin Impairment Nursing Diagnoses: Impaired tissue integrity Knowledge deficit related to ulceration/compromised skin integrity Goals: Ulcer/skin breakdown will have a volume reduction of 80% by week 12 Date Initiated: 05/29/2016 Target Resolution Date: 08/02/2016 Goal Status: Active Interventions: Assess patient/caregiver ability to perform ulcer/skin care regimen upon admission and as needed Assess ulceration(s) every visit Notes: [name], Jared TULLIS. (  [id number]) Electronic Signature(s) Signed: 08/14/2016 4:33:22 PM By: Alric Quan Entered By: Alric Quan on 08/14/2016 15:29:26 [name]  ([id number]) -------------------------------------------------------------------------------- Pain Assessment Details Patient Name: [name], [name]. Date of Service: 08/14/2016 3:30 PM Medical Record Number: [id number] Patient Account Number: [id number] Date of Birth/Sex: 06-01-1934 (82 y.o. Male) Treating RN: Ahmed Prima Primary Care Scarlettrose Costilow: [name] Other Clinician: Referring Mathilde Mcwherter: [name] Treating Keeyon Privitera/Extender: [name] in Treatment: 11 Active Problems Location of Pain Severity and Description of Pain Patient Has Paino No Site Locations With Dressing Change: No Pain Management and Medication Current Pain Management: Electronic Signature(s) Signed: 08/14/2016 4:33:22 PM By: Alric Quan Entered By: Alric Quan on 08/14/2016 15:19:38 [name] ([id number]) -------------------------------------------------------------------------------- Patient/Caregiver Education Details Patient Name: [name], LUMM. Date of Service: 08/14/2016 3:30 PM Medical Record Number: [id number] Patient Account Number: [id number] Date of Birth/Gender: 1935-03-31 (82 y.o. Male) Treating RN: Ahmed Prima Primary Care Physician: [name] Other Clinician: Referring Physician: [name] Treating Physician/Extender: [name] in Treatment: 11 Education Assessment Education Provided To: Patient Education Topics Provided Wound/Skin Impairment: Handouts: Other: change dressing as ordered Methods: Demonstration, Explain/Verbal Responses: State content correctly Electronic Signature(s) Signed: 08/14/2016 4:33:22 PM By: Alric Quan Entered By: Alric Quan on 08/14/2016 15:32:59 [name] ([id number]) -------------------------------------------------------------------------------- Wound Assessment Details Patient Name: [name], [name]. Date of Service: 08/14/2016 3:30 PM Medical Record Number:  [id number] Patient Account Number: [id number] Date of Birth/Sex: November 09, 1934 (82 y.o. Male) Treating RN: Ahmed Prima Primary Care Bowie Delia: [name] Other Clinician: Referring Mory Herrman: [name] Treating Kathrine Rieves/Extender: [name] in Treatment: 11 Wound Status Wound Number: 1 Primary Pressure Ulcer Etiology: Wound Location: Left Malleolus - Lateral Wound Open Wounding Event: Pressure Injury Status: Date Acquired: 05/01/2016 Comorbid Cataracts, Anemia, Sleep Apnea, Weeks Of Treatment: 11 History: Arrhythmia, Hypertension, Dementia Clustered Wound: No Photos Photo Uploaded By: Alric Quan on 08/14/2016 16:06:54 Wound Measurements Length: (cm) 0.3 Width: (cm) 0.3 Depth: (cm) 0.2 Area: (cm) 0.071 Volume: (cm) 0.014 % Reduction in Area: 63.8% % Reduction in Volume: 30% Epithelialization: None Tunneling: No Undermining: No Wound Description Classification: Category/Stage II Foul Odor Aft Wound Margin: Distinct, outline attached Slough/Fibrin Exudate Amount: Large Exudate Type: Serous Exudate Color: amber er Cleansing: No o No Wound Bed Granulation Amount: Medium (34-66%) Exposed Structure Granulation Quality: Pink Fat Layer (Subcutaneous Tissue) Exposed: Yes Necrotic Amount: Medium (34-66%) Necrotic Quality: Eschar, [address], [place]. ([id number]) Periwound Skin Texture Texture Color No Abnormalities Noted: No No Abnormalities Noted: No Erythema: Yes Moisture Erythema Location: Circumferential No Abnormalities Noted: No Temperature / Pain Temperature: No Abnormality Tenderness on Palpation: Yes Wound Preparation Ulcer Cleansing: Rinsed/Irrigated with Saline Topical Anesthetic Applied: Other: lidocaine 4%, Treatment Notes Wound #1 (Left, Lateral Malleolus) 1. Cleansed with: Clean wound with Normal Saline 2. Anesthetic Topical Lidocaine 4% cream to wound bed prior to debridement 3. Peri-wound Care: Skin  Prep 4. Dressing Applied: Hydrogel Other dressing (specify in notes) 5. Secondary Dressing Applied Bordered Foam Dressing Dry Gauze Notes sorbact Electronic Signature(s) Signed: 08/14/2016 4:33:22 PM By: Alric Quan Entered By: Alric Quan on 08/14/2016 15:26:02 [name] ([id number]) -------------------------------------------------------------------------------- Vitals Details Patient Name: [name], [name]. Date of Service: 08/14/2016 3:30 PM Medical Record Number: [id number] Patient Account Number: [id number] Date of Birth/Sex: December 23, 1934 (82 y.o. Male) Treating RN: Ahmed Prima Primary Care Whitnee Orzel: [name] Other Clinician: Referring Dallas Torok: [name] Treating Aleysia Oltmann/Extender: [name] in Treatment: 11 Vital Signs Time Taken: 15:19 Temperature (F):  98.2 Height (in): 69 Pulse (bpm): 55 Weight (lbs): 174.8 Respiratory Rate (breaths/min): 18 Body Mass Index (BMI): 25.8 Blood Pressure (mmHg): 118/82 Reference Range: 80 - 120 mg / dl Electronic Signature(s) Signed: 08/14/2016 4:33:22 PM By: Alric Quan Entered By: Alric Quan on 08/14/2016 15:21:12

## 2016-08-19 ENCOUNTER — Ambulatory Visit: Payer: PPO | Admitting: Physical Therapy

## 2016-08-19 ENCOUNTER — Encounter: Payer: Self-pay | Admitting: Physical Therapy

## 2016-08-19 DIAGNOSIS — M6281 Muscle weakness (generalized): Secondary | ICD-10-CM

## 2016-08-19 DIAGNOSIS — R2689 Other abnormalities of gait and mobility: Secondary | ICD-10-CM

## 2016-08-19 DIAGNOSIS — R296 Repeated falls: Secondary | ICD-10-CM

## 2016-08-19 DIAGNOSIS — R293 Abnormal posture: Secondary | ICD-10-CM

## 2016-08-19 NOTE — Therapy (Signed)
[facility] [address] [place], [place], [address] Phone: [phone]   Fax:  [phone]  Physical Therapy Treatment  Patient Details  Name: [name] MRN: [id number] Date of Birth: 12-16-1934 Referring Provider: Jennings Books, MD  Encounter Date: 08/19/2016      PT End of Session - 08/19/16 1655    Visit Number 17   Number of Visits 20   Date for PT Re-Evaluation 09/02/16   Authorization - Visit Number 32   Authorization - Number of Visits 22   PT Start Time 1025   PT Stop Time 1344   PT Time Calculation (min) 50 min   Equipment Utilized During Treatment Gait belt   Activity Tolerance Patient tolerated treatment well;Patient limited by fatigue   Behavior During Therapy [facility] for tasks assessed/performed      Past Medical History:  Diagnosis Date  . Absolute anemia 09/11/2014  . Anxiety   . Atrial fibrillation ([place]) 01/23/2014  . BP (high blood pressure) 11/07/2013  . Carotid artery disease ([place]) 09/11/2014  . Complete rotator cuff rupture of left shoulder 12/30/2013  . Dementia 09/11/2014  . Depression   . Heart murmur   . HLD (hyperlipidemia) 01/23/2014  . Hypertension   . Obstructive apnea 11/07/2013  . Personal history of other diseases of the circulatory system 01/23/2014  . Sleep apnea   . 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 Back Surgery- Tumor Removal    . SHOULDER SURGERY Right 2007    There were no vitals filed for this visit.      Subjective Assessment - 08/19/16 1654    Subjective Patient and wife report that patient continues to have carryover difficulty at home and back pain. Patient is frequently fatigued.    Patient is accompained by: Family member   Pertinent History Hx of left knee pain, low back pain, shoulder pain. Hx of falls, dementia, and teretoma of the spine   Limitations Sitting;Lifting;Standing;Walking;House hold activities    Diagnostic tests BERG   Patient Stated Goals get stronger, pick up legs better when walking, getting up easier and faster   Currently in Pain? Yes   Pain Score 3    Pain Location Back   Pain Orientation Lower     TherEx Standing posture while playing trumpet 4xlength of song. Good control of shoulders, slight knee bend. Prone: laying prone with prone press prop 10x5-10 seconds Forward backwards standing clap for upright posture Sit to stand, turn pivot, sit in chair 2 ft away (stand 180 turn, sit) 5x with cues for upright posture 1lb weights: shoulder abduction 15x-tactile cues for upright posture Seated arm flexion to promote back extension 10x Scapular retractions in mirror  Education to wife for proper tactile cues for upright posture, one hand on low back, other hand tapping shoulder lightly.  Neuro Standing on Airex pad playing trumpet x2 songs with no LOB Ball throw with wife and CGA: 2x12-15 throws with good control of BOS and no LOB. Able to reach outside of BOS and return.   Pt. response to medical necessity: Pt. And Pt.'s wife agreed to return to MD to discuss/re-assess situation due to limited carryover at home.        PT Education - 08/19/16 1655    Education provided Yes   Education Details returning to doctor for re-assessment   Person(s) Educated Patient;Spouse   Methods Explanation  Comprehension Verbalized understanding             PT Long Term Goals - 08/05/16 1401      PT LONG TERM GOAL #1   Title Patient will increase score on Berg Balance to 49/56 for decreased fall risk.    Baseline 3/7: 50/56 ; 2/8: 44/56   Time 4   Period Weeks   Status Achieved     PT LONG TERM GOAL #2   Title Patient will have gross 4/5 bilateral LE strength to increase ability to perform functional activities.    Baseline Gross LLE 4-/5 knee flexion 3+/5, RLE 4/5   Time 4   Period Weeks   Status On-going     PT LONG TERM GOAL #3   Title Patient will ambulate 5  minutes with a cane without LOB to increase community mobility.     Baseline Patient able to walk 5 minutes with cane but with poor posture.    Time 4   Period Weeks   Status Achieved     PT LONG TERM GOAL #4   Title Patient will perform 10 sit to stands without use of hands or LOB to demonstrate improved capacity for functional activities.    Baseline Patient can perform 10 sit to stands with no hands with decreased eccentric control with fatigue.    Time 4   Period Weeks   Status Achieved     PT LONG TERM GOAL #5   Title Patient will have upright posture for 10 minutes to allow for better body mechanics and decreased back pain.    Baseline Frequent cues required for postural control in standing and seated. Less cues required than before.    Time 4   Period Weeks   Status On-going     Additional Long Term Goals   Additional Long Term Goals Yes     PT LONG TERM GOAL #6   Title Patient will not have SI/back pain while ambulating to allow for increased community mobility.    Baseline Patient has occasional 4/10 back pain while ambulating   Time 4   Period Weeks   Status On-going     PT LONG TERM GOAL #7   Title Patient will perform 10 sit to stands from surface lower than PT room chairs to increase independence in home setting due to low height of furniture at home.    Baseline Patient able to perform 10 STS from low surface with UE   Time 4   Period Weeks   Status Achieved     PT LONG TERM GOAL #8   Title Patient will score a 52/56 on Berg to decrease fall risk to minimal and increase single limb abilities to improve carryover to household activities such as stepping into shower.    Baseline 3/7: 50/56, extreme difficulty stepping into shower 4/3: 49/56 (fatigued today)   Time 4   Period Weeks   Status On-going     PT LONG TERM GOAL  #9   TITLE Patient will demonstrate independence in home postural program to promote improved control of posture in seated and standing for  progression to home based program.    Baseline Pt. was given new postural control program   Time 4   Period Weeks   Status New               Plan - 08/19/16 1700    Clinical Impression Statement Patient has difficulty with carryover of posture and ambulatory techniques at  home. Situational memory is demonstrated with patient able to perform proper posture for prolonged time and ambulate with cues in clinic, however carryover immediately deteriorates after stepping outside clinic doors. Patient continues to have low back pain creating fatigue and limiting pt.'s ability to perform activities. Patient continues to have some difficulty with recall and memory affecting HEP and activities. Patient's balance has improved both dynamically and statically as well as LE strength. At this time due to lack of carryover at home and back pain patient and pt.'s wife agree to return to MD to discuss further re-assessment/possible plan of care.    Rehab Potential Fair   Clinical Impairments Affecting Rehab Potential hx of falls, dementia, teratoma, mitral valve polapse, cancer, a fib, L knee injury, L shoulder, back pain   PT Frequency 1x / week   PT Duration 4 weeks   PT Treatment/Interventions ADLs/Self Care Home Management;Aquatic Therapy;Cryotherapy;Electrical Stimulation;Biofeedback;Moist Heat;DME Instruction;Gait training;Stair training;Functional mobility training;Therapeutic activities;Therapeutic exercise;Balance training;Neuromuscular re-education;Patient/family education;Orthotic Fit/Training;Manual techniques;Passive range of motion;Dry needling;Energy conservation   PT Next Visit Plan call in 2-3 weeks   PT Home Exercise Plan see sheet   Consulted and Agree with Plan of Care Patient;Family member/caregiver   Family Member Consulted wife      Patient will benefit from skilled therapeutic intervention in order to improve the following deficits and impairments:  Abnormal gait, Decreased activity  tolerance, Decreased balance, Decreased mobility, Decreased knowledge of use of DME, Decreased endurance, Decreased range of motion, Decreased safety awareness, Decreased skin integrity, Decreased knowledge of precautions, Decreased strength, Hypomobility, Difficulty walking, Impaired flexibility, Postural dysfunction, Improper body mechanics, Decreased cognition  Visit Diagnosis: Muscle weakness (generalized)  Other abnormalities of gait and mobility  Repeated falls  Abnormal posture     Problem List Patient Active Problem List   Diagnosis Date Noted  . Fall 02/18/2016  . Scalp laceration 02/18/2016  . Splenic laceration 02/18/2016  . Acute blood loss anemia 02/18/2016  . Multiple rib fractures 02/16/2016  . Mild dementia 04/10/2015  . Aggrieved 03/02/2015  . Absolute anemia 09/11/2014  . Carotid artery disease ([place]) 09/11/2014  . Dementia 09/11/2014  . History of repair of rotator cuff 03/02/2014  . Atrial fibrillation with RVR ([place]) 01/23/2014  . Personal history of other diseases of the circulatory system 01/23/2014  . HLD (hyperlipidemia) 01/23/2014  . Complete rotator cuff rupture of left shoulder 12/30/2013  . BP (high blood pressure) 11/07/2013  . Amnesia 11/07/2013  . Obstructive apnea 11/07/2013   This entire session was performed under direct supervision and direction of a licensed therapist/therapist assistant. I have personally read, edited and approve of the note as written.  Pura Spice, PT, DPT # [id number] Janna Arch, SPT 08/20/2016, 12:40 PM  [facility] [address] [place], [place], [address] Phone: [phone]   Fax:  [phone]  Name: AHAN EISENBERGER MRN: [id number] Date of Birth: 12-02-1934

## 2016-08-21 ENCOUNTER — Ambulatory Visit: Payer: PPO | Admitting: Physical Therapy

## 2016-08-21 ENCOUNTER — Encounter: Payer: PPO | Admitting: Surgery

## 2016-08-21 DIAGNOSIS — L89522 Pressure ulcer of left ankle, stage 2: Secondary | ICD-10-CM | POA: Diagnosis not present

## 2016-08-22 NOTE — Progress Notes (Signed)
RAE, SUTCLIFFE ([id number]) Visit Report for 08/21/2016 Chief Complaint Document Details Patient Name: [name], [name]. Date of Service: 08/21/2016 2:30 PM Medical Record Number: [id number] Patient Account Number: [id number] Date of Birth/Sex: July 12, 1934 (82 y.o. Male) Treating RN: Ahmed Prima Primary Care Provider: [name] Other Clinician: Referring Provider: [name] Treating Provider/Extender: [name] in Treatment: 12 Information Obtained from: Patient Chief Complaint Patient is at the clinic for treatment of an open pressure ulcer to the left ankle which she's had for about 5 weeks Electronic Signature(s) Signed: 08/21/2016 2:48:02 PM By: Christin Fudge MD, FACS Entered By: Christin Fudge on 08/21/2016 14:48:02 [name] ([id number]) -------------------------------------------------------------------------------- Debridement Details Patient Name: [name]. Date of Service: 08/21/2016 2:30 PM Medical Record Number: [id number] Patient Account Number: [id number] Date of Birth/Sex: Sep 23, 1934 (82 y.o. Male) Treating RN: Ahmed Prima Primary Care Provider: [name] Other Clinician: Referring Provider: [name] Treating Provider/Extender: [name] in Treatment: 12 Debridement Performed for Wound #1 Left,Lateral Malleolus Assessment: Performed By: Physician Christin Fudge, MD Debridement: Debridement Pre-procedure Yes - 14:40 Verification/Time Out Taken: Start Time: 14:41 Pain Control: Lidocaine 4% Topical Solution Level: Skin/Subcutaneous Tissue Total Area Debrided (L x 0.3 (cm) x 0.3 (cm) = 0.09 (cm) W): Tissue and other Subcutaneous material debrided: Instrument: Forceps Bleeding: Minimum Hemostasis Achieved: Pressure End Time: 14:43 Procedural Pain: 0 Post Procedural Pain: 0 Response to Treatment: Procedure was tolerated well Post Debridement Measurements of Total Wound Length: (cm)  0.3 Stage: Category/Stage II Width: (cm) 0.3 Depth: (cm) 0.2 Volume: (cm) 0.014 Character of Wound/Ulcer Post Requires Further Debridement: Debridement Severity of Tissue Post Fat layer exposed Debridement: Post Procedure Diagnosis Same as Pre-procedure Electronic Signature(s) Signed: 08/21/2016 2:47:41 PM By: Christin Fudge MD, FACS Signed: 08/21/2016 4:17:25 PM By: Alric Quan Entered By: Christin Fudge on 08/21/2016 14:47:40 ELGIE, MAZIARZ ([id number]) MAXTYN, NUZUM. ([id number]) -------------------------------------------------------------------------------- HPI Details Patient Name: [name]. Date of Service: 08/21/2016 2:30 PM Medical Record Number: [id number] Patient Account Number: [id number] Date of Birth/Sex: 12/20/1934 (82 y.o. Male) Treating RN: Ahmed Prima Primary Care Provider: [name] Other Clinician: Referring Provider: [name] Treating Provider/Extender: [name] in Treatment: 12 History of Present Illness Location: left ankle laterally Quality: Patient reports experiencing a dull pain to affected area(s). Severity: Patient states wound are getting worse. Duration: Patient has had the wound for < 5 weeks prior to presenting for treatment Timing: Pain in wound is Intermittent (comes and goes Context: The wound appeared gradually over time Modifying Factors: Other treatment(s) tried include:put on Bactroban and doxycycline Associated Signs and Symptoms: Patient reports having difficulty standing for long periods. HPI Description: 82 year old gentleman who is known to have a history of dementia but is fairly functional and is able to ambulate has been having a pressure ulcer to the left ankle due to laying in bed for longer than he should. Past history of atrial fibrillation, dementia, hyperlipidemia, hypertension. He's also had some orthopedic related surgeries in the past and was a former smoker and is given up in  1969. Recently seen by his PCP who put him on doxycycline and asked him to apply Bactroban and see [place] at the wound clinic. 06/05/2016 -- had a x-ray of the left ankle which showed no acute bony pathology. Electronic Signature(s) Signed: 08/21/2016 2:48:12 PM By: Christin Fudge MD, FACS Entered By: Christin Fudge on 08/21/2016 14:48:12 [name] ([id number]) -------------------------------------------------------------------------------- Physical Exam Details Patient Name: [name]. Date of Service:  08/21/2016 2:30 PM Medical Record Number: [id number] Patient Account Number: [id number] Date of Birth/Sex: February 15, 1935 (82 y.o. Male) Treating RN: Ahmed Prima Primary Care Provider: [name] Other Clinician: Referring Provider: [name] Treating Provider/Extender: [name] in Treatment: 12 Constitutional . Pulse regular. Respirations normal and unlabored. Afebrile. . Eyes Nonicteric. Reactive to light. Ears, Nose, Mouth, and Throat Lips, teeth, and gums WNL.Marland Kitchen Moist mucosa without lesions. Neck supple and nontender. No palpable supraclavicular or cervical adenopathy. Normal sized without goiter. Respiratory WNL. No retractions.. Breath sounds WNL, No rubs, rales, rhonchi, or wheeze.. Cardiovascular Heart rhythm and rate regular, no murmur or gallop.. Pedal Pulses WNL. No clubbing, cyanosis or edema. Chest Breasts symmetical and no nipple discharge.. Breast tissue WNL, no masses, lumps, or tenderness.. Lymphatic No adneopathy. No adenopathy. No adenopathy. Musculoskeletal Adexa without tenderness or enlargement.. Digits and nails w/o clubbing, cyanosis, infection, petechiae, ischemia, or inflammatory conditions.. Integumentary (Hair, Skin) No suspicious lesions. No crepitus or fluctuance. No peri-wound warmth or erythema. No masses.Marland Kitchen Psychiatric Judgement and insight Intact.. No evidence of depression, anxiety, or agitation.. Notes the edges  are coming in nicely and there is some healthy epithelization from the sides. The base of the ulcer has some subcutaneous debris and I have removed this with a sharp toothed forcep. Minimal bleeding controlled with pressure. Does not probe down to bone. Electronic Signature(s) Signed: 08/21/2016 2:48:47 PM By: Christin Fudge MD, FACS Entered By: Christin Fudge on 08/21/2016 14:48:46 [name] ([id number]) -------------------------------------------------------------------------------- Physician Orders Details Patient Name: PATRICH, HEINZE. Date of Service: 08/21/2016 2:30 PM Medical Record Number: [id number] Patient Account Number: [id number] Date of Birth/Sex: 18-Aug-1934 (82 y.o. Male) Treating RN: Ahmed Prima Primary Care Provider: [name] Other Clinician: Referring Provider: [name] Treating Provider/Extender: [name] in Treatment: 12 Verbal / Phone Orders: Yes Clinician: Carolyne Fiscal, Debi Read Back and Verified: Yes Diagnosis Coding Wound Cleansing Wound #1 Left,Lateral Malleolus o Clean wound with Normal Saline. o May Shower, gently pat wound dry prior to applying new dressing. Anesthetic Wound #1 Left,Lateral Malleolus o Topical Lidocaine 4% cream applied to wound bed prior to debridement - for clinic use Skin Barriers/Peri-Wound Care Wound #1 Left,Lateral Malleolus o Skin Prep Primary Wound Dressing Wound #1 Left,Lateral Malleolus o Hydrogel o Other: - sorbact Secondary Dressing Wound #1 Left,Lateral Malleolus o Dry Gauze o Boardered Foam Dressing Follow-up Appointments Wound #1 Left,Lateral Malleolus o Return Appointment in 1 week. Edema Control Wound #1 Left,Lateral Malleolus o Elevate legs to the level of the heart and pump ankles as often as possible Off-Loading Wound #1 Left,Lateral Malleolus o Turn and reposition every 2 hours o Other: - keep pressure off of affected area LENNIX, KNEISEL.  ([id number]) Additional Orders / Instructions Wound #1 Left,Lateral Malleolus o Increase protein intake. Medications-please add to medication list. Wound #1 Left,Lateral Malleolus o Other: - Vitamin A, Vitamin C, Zinc, MVI Electronic Signature(s) Signed: 08/21/2016 4:16:12 PM By: Christin Fudge MD, FACS Signed: 08/21/2016 4:17:25 PM By: Alric Quan Entered By: Alric Quan on 08/21/2016 14:43:36 [name] ([id number]) -------------------------------------------------------------------------------- Problem List Details Patient Name: DARSH, VANDEVOORT. Date of Service: 08/21/2016 2:30 PM Medical Record Number: [id number] Patient Account Number: [id number] Date of Birth/Sex: 12/02/1934 (82 y.o. Male) Treating RN: Ahmed Prima Primary Care Provider: [name] Other Clinician: Referring Provider: [name] Treating Provider/Extender: [name] in Treatment: 12 Active Problems ICD-10 Encounter Code Description Active Date Diagnosis [phone] Pressure ulcer of left ankle, stage 2 05/29/2016 Yes F02.80  Dementia in other diseases classified elsewhere without 05/29/2016 Yes behavioral disturbance Inactive Problems Resolved Problems Electronic Signature(s) Signed: 08/21/2016 2:47:22 PM By: Christin Fudge MD, FACS Entered By: Christin Fudge on 08/21/2016 14:47:21 [name] ([id number]) -------------------------------------------------------------------------------- Progress Note Details Patient Name: DEWAUN, KINZLER. Date of Service: 08/21/2016 2:30 PM Medical Record Number: [id number] Patient Account Number: [id number] Date of Birth/Sex: 15-Aug-1934 (82 y.o. Male) Treating RN: Ahmed Prima Primary Care Provider: [name] Other Clinician: Referring Provider: [name] Treating Provider/Extender: [name] in Treatment: 12 Subjective Chief Complaint Information obtained from Patient Patient is at the clinic for  treatment of an open pressure ulcer to the left ankle which she's had for about 5 weeks History of Present Illness (HPI) The following HPI elements were documented for the patient's wound: Location: left ankle laterally Quality: Patient reports experiencing a dull pain to affected area(s). Severity: Patient states wound are getting worse. Duration: Patient has had the wound for < 5 weeks prior to presenting for treatment Timing: Pain in wound is Intermittent (comes and goes Context: The wound appeared gradually over time Modifying Factors: Other treatment(s) tried include:put on Bactroban and doxycycline Associated Signs and Symptoms: Patient reports having difficulty standing for long periods. 82 year old gentleman who is known to have a history of dementia but is fairly functional and is able to ambulate has been having a pressure ulcer to the left ankle due to laying in bed for longer than he should. Past history of atrial fibrillation, dementia, hyperlipidemia, hypertension. He's also had some orthopedic related surgeries in the past and was a former smoker and is given up in 1969. Recently seen by his PCP who put him on doxycycline and asked him to apply Bactroban and see [place] at the wound clinic. 06/05/2016 -- had a x-ray of the left ankle which showed no acute bony pathology. Objective Constitutional Pulse regular. Respirations normal and unlabored. Afebrile. Vitals Time Taken: 2:25 PM, Height: 69 in, Weight: 174.8 lbs, BMI: 25.8, Pulse: 52 bpm, Respiratory Rate: 18 breaths/min, Blood Pressure: 128/58 mmHg. MACINTYRE, ALEXA. ([id number]) Eyes Nonicteric. Reactive to light. Ears, Nose, Mouth, and Throat Lips, teeth, and gums WNL.Marland Kitchen Moist mucosa without lesions. Neck supple and nontender. No palpable supraclavicular or cervical adenopathy. Normal sized without goiter. Respiratory WNL. No retractions.. Breath sounds WNL, No rubs, rales, rhonchi, or wheeze.. Cardiovascular Heart  rhythm and rate regular, no murmur or gallop.. Pedal Pulses WNL. No clubbing, cyanosis or edema. Chest Breasts symmetical and no nipple discharge.. Breast tissue WNL, no masses, lumps, or tenderness.. Lymphatic No adneopathy. No adenopathy. No adenopathy. Musculoskeletal Adexa without tenderness or enlargement.. Digits and nails w/o clubbing, cyanosis, infection, petechiae, ischemia, or inflammatory conditions.Marland Kitchen Psychiatric Judgement and insight Intact.. No evidence of depression, anxiety, or agitation.. General Notes: the edges are coming in nicely and there is some healthy epithelization from the sides. The base of the ulcer has some subcutaneous debris and I have removed this with a sharp toothed forcep. Minimal bleeding controlled with pressure. Does not probe down to bone. Integumentary (Hair, Skin) No suspicious lesions. No crepitus or fluctuance. No peri-wound warmth or erythema. No masses.. Wound #1 status is Open. Original cause of wound was Pressure Injury. The wound is located on the Left,Lateral Malleolus. The wound measures 0.3cm length x 0.3cm width x 0.2cm depth; 0.071cm^2 area and 0.014cm^3 volume. There is Fat Layer (Subcutaneous Tissue) Exposed exposed. There is no tunneling or undermining noted. There is a large amount of serous drainage noted. The wound margin  is distinct with the outline attached to the wound base. There is medium (34-66%) pink granulation within the wound bed. There is a medium (34-66%) amount of necrotic tissue within the wound bed including Eschar and Adherent Slough. The periwound skin appearance exhibited: Erythema. The surrounding wound skin color is noted with erythema which is circumferential. Periwound temperature was noted as No Abnormality. The periwound has tenderness on palpation. SULO, JANCZAK ([id number]) Assessment Active Problems ICD-10 [phone] - Pressure ulcer of left ankle, stage 2 F02.80 - Dementia in other diseases classified  elsewhere without behavioral disturbance Procedures Wound #1 Wound #1 is a Pressure Ulcer located on the Left,Lateral Malleolus . There was a Skin/Subcutaneous Tissue Debridement ([id number]) debridement with total area of 0.09 sq cm performed by Christin Fudge, MD. with the following instrument(s): Forceps including Subcutaneous after achieving pain control using Lidocaine 4% Topical Solution. A time out was conducted at 14:40, prior to the start of the procedure. A Minimum amount of bleeding was controlled with Pressure. The procedure was tolerated well with a pain level of 0 throughout and a pain level of 0 following the procedure. Post Debridement Measurements: 0.3cm length x 0.3cm width x 0.2cm depth; 0.014cm^3 volume. Post debridement Stage noted as Category/Stage II. Character of Wound/Ulcer Post Debridement requires further debridement. Severity of Tissue Post Debridement is: Fat layer exposed. Post procedure Diagnosis Wound #1: Same as Pre-Procedure Plan Wound Cleansing: Wound #1 Left,Lateral Malleolus: Clean wound with Normal Saline. May Shower, gently pat wound dry prior to applying new dressing. Anesthetic: Wound #1 Left,Lateral Malleolus: Topical Lidocaine 4% cream applied to wound bed prior to debridement - for clinic use Skin Barriers/Peri-Wound Care: Wound #1 Left,Lateral Malleolus: Skin Prep Primary Wound Dressing: Wound #1 Left,Lateral Malleolus: Hydrogel Other: - sorbact TRAFTON, ROKER. ([id number]) Secondary Dressing: Wound #1 Left,Lateral Malleolus: Dry Gauze Boardered Foam Dressing Follow-up Appointments: Wound #1 Left,Lateral Malleolus: Return Appointment in 1 week. Edema Control: Wound #1 Left,Lateral Malleolus: Elevate legs to the level of the heart and pump ankles as often as possible Off-Loading: Wound #1 Left,Lateral Malleolus: Turn and reposition every 2 hours Other: - keep pressure off of affected area Additional Orders / Instructions: Wound  #1 Left,Lateral Malleolus: Increase protein intake. Medications-please add to medication list.: Wound #1 Left,Lateral Malleolus: Other: - Vitamin A, Vitamin C, Zinc, MVI I have recommended: 1. Sorbact with hydrogel to be placed into the wound and a protective bordered foam. 2. Off-Loading has been discussed in great detail 3. adequate protein, vitamin A, vitamin C and zinc 4. Regular visits to the wound center. Electronic Signature(s) Signed: 08/21/2016 2:49:17 PM By: Christin Fudge MD, FACS Entered By: Christin Fudge on 08/21/2016 14:49:16 [name] ([id number]) -------------------------------------------------------------------------------- SuperBill Details Patient Name: FORTUNE, BRANNIGAN. Date of Service: 08/21/2016 Medical Record Number: [id number] Patient Account Number: [id number] Date of Birth/Sex: 1935/02/03 (82 y.o. Male) Treating RN: Ahmed Prima Primary Care Provider: [name] Other Clinician: Referring Provider: [name] Treating Provider/Extender: [name] in Treatment: 12 Diagnosis Coding ICD-10 Codes Code Description [phone] Pressure ulcer of left ankle, stage 2 F02.80 Dementia in other diseases classified elsewhere without behavioral disturbance Facility Procedures CPT4: Description Modifier Quantity Code [id number] 11042 - DEB SUBQ TISSUE 20 SQ CM/< 1 ICD-10 Description Diagnosis L89.522 Pressure ulcer of left ankle, stage 2 F02.80 Dementia in other diseases classified elsewhere without behavioral disturbance Physician Procedures CPT4: Description Modifier Quantity Code [id number] 11042 - WC PHYS SUBQ TISS 20 SQ CM 1 ICD-10 Description Diagnosis L89.522 Pressure ulcer of left  ankle, stage 2 F02.80 Dementia in other diseases classified elsewhere without behavioral disturbance Electronic Signature(s) Signed: 08/21/2016 2:49:28 PM By: Christin Fudge MD, FACS Entered By: Christin Fudge on 08/21/2016 14:49:28

## 2016-08-22 NOTE — Progress Notes (Signed)
JAMILE, SIVILS ([id number]) Visit Report for 08/21/2016 Arrival Information Details Patient Name: [name], [name]. Date of Service: 08/21/2016 2:30 PM Medical Record Number: [id number] Patient Account Number: [id number] Date of Birth/Sex: 06-25-1934 (82 y.o. Male) Treating RN: Ahmed Prima Primary Care Shanaya Schneck: [name] Other Clinician: Referring Nicki Gracy: [name] Treating Eliah Marquard/Extender: [name] in Treatment: 12 Visit Information History Since Last Visit All ordered tests and consults were completed: No Patient Arrived: [name] Added or deleted any medications: No Arrival Time: 14:25 Any new allergies or adverse reactions: No Accompanied By: wife Had a fall or experienced change in No Transfer Assistance: [employer] activities of daily living that may affect Patient Lift risk of falls: Patient Identification Verified: Yes Signs or symptoms of abuse/neglect since last No Secondary Verification Process Yes visito Completed: Hospitalized since last visit: No Patient Requires Transmission- No Has Dressing in Place as Prescribed: Yes Based Precautions: Pain Present Now: No Patient Has Alerts: Yes Patient Alerts: ASA Electronic Signature(s) Signed: 08/21/2016 4:17:25 PM By: Alric Quan Entered By: Alric Quan on 08/21/2016 14:25:42 [name] ([id number]) -------------------------------------------------------------------------------- Encounter Discharge Information Details Patient Name: [name]. Date of Service: 08/21/2016 2:30 PM Medical Record Number: [id number] Patient Account Number: [id number] Date of Birth/Sex: March 14, 1935 (82 y.o. Male) Treating RN: Ahmed Prima Primary Care Keston Seever: [name] Other Clinician: Referring Marshell Rieger: [name] Treating Ciro Tashiro/Extender: [name] in Treatment: 12 Encounter Discharge Information Items Discharge Pain Level: 0 Discharge Condition:  Stable Ambulatory Status: Cane Discharge Destination: Home Transportation: Private Auto Accompanied By: wife Schedule Follow-up Appointment: Yes Medication Reconciliation completed No and provided to Patient/Care Deyton Ellenbecker: Provided on Clinical Summary of Care: 08/21/2016 Form Type Recipient Paper Patient ET Electronic Signature(s) Signed: 08/21/2016 2:53:22 PM By: Ruthine Dose Entered By: Ruthine Dose on 08/21/2016 14:53:22 [name] ([id number]) -------------------------------------------------------------------------------- Lower Extremity Assessment Details Patient Name: [name]. Date of Service: 08/21/2016 2:30 PM Medical Record Number: [id number] Patient Account Number: [id number] Date of Birth/Sex: 10/24/1934 (82 y.o. Male) Treating RN: Ahmed Prima Primary Care Keian Odriscoll: [name] Other Clinician: Referring Addilee Neu: [name] Treating Ethelene Closser/Extender: [name] in Treatment: 12 Vascular Assessment Pulses: Dorsalis Pedis Palpable: [Left:Yes] Posterior Tibial Extremity colors, hair growth, and conditions: Extremity Color: [Left:Normal] Temperature of Extremity: [Left:Warm] Capillary Refill: [Left:< 3 seconds] Toe Nail Assessment Left: Right: Thick: No Discolored: No Deformed: No Improper Length and Hygiene: Yes Electronic Signature(s) Signed: 08/21/2016 4:17:25 PM By: Alric Quan Entered By: Alric Quan on 08/21/2016 14:35:43 Crockett, [name] ([id number]) -------------------------------------------------------------------------------- Multi Wound Chart Details Patient Name: [name]. Date of Service: 08/21/2016 2:30 PM Medical Record Number: [id number] Patient Account Number: [id number] Date of Birth/Sex: June 27, 1934 (82 y.o. Male) Treating RN: Ahmed Prima Primary Care Brylee Mcgreal: [name] Other Clinician: Referring Mariana Wiederholt: [name] Treating Teyana Pierron/Extender: [name]  in Treatment: 12 Vital Signs Height(in): 69 Pulse(bpm): 52 Weight(lbs): 174.8 Blood Pressure 128/58 (mmHg): Body Mass Index(BMI): 26 Temperature(F): Respiratory Rate 18 (breaths/min): Photos: [1:No Photos] [N/A:N/A] Wound Location: [1:Left Malleolus - Lateral] [N/A:N/A] Wounding Event: [1:Pressure Injury] [N/A:N/A] Primary Etiology: [1:Pressure Ulcer] [N/A:N/A] Comorbid History: [1:Cataracts, Anemia, Sleep Apnea, Arrhythmia, Hypertension, Dementia] [N/A:N/A] Date Acquired: [1:05/01/2016] [N/A:N/A] Weeks of Treatment: [1:12] [N/A:N/A] Wound Status: [1:Open] [N/A:N/A] Measurements L x W x D 0.3x0.3x0.2 [N/A:N/A] (cm) Area (cm) : [1:0.071] [N/A:N/A] Volume (cm) : [1:0.014] [N/A:N/A] % Reduction in Area: [1:63.80%] [N/A:N/A] % Reduction in Volume: 30.00% [N/A:N/A] Classification: [1:Category/Stage II] [N/A:N/A] Exudate Amount: [1:Large] [N/A:N/A] Exudate Type: [1:Serous] [N/A:N/A] Exudate Color: [  1:amber] [N/A:N/A] Wound Margin: [1:Distinct, outline attached] [N/A:N/A] Granulation Amount: [1:Medium (34-66%)] [N/A:N/A] Granulation Quality: [1:Pink] [N/A:N/A] Necrotic Amount: [1:Medium (34-66%)] [N/A:N/A] Necrotic Tissue: [1:Eschar, Adherent Slough] [N/A:N/A] Exposed Structures: [1:Fat Layer (Subcutaneous Tissue) Exposed: Yes] [N/A:N/A] Epithelialization: [1:None] [N/A:N/A] Debridement: [N/A:N/A] Debridement ([id number]) Pre-procedure 14:40 N/A N/A Verification/Time Out Taken: Pain Control: Lidocaine 4% Topical N/A N/A Solution Tissue Debrided: Subcutaneous N/A N/A Level: Skin/Subcutaneous N/A N/A Tissue Debridement Area (sq 0.09 N/A N/A cm): Instrument: Forceps N/A N/A Bleeding: Minimum N/A N/A Hemostasis Achieved: Pressure N/A N/A Procedural Pain: 0 N/A N/A Post Procedural Pain: 0 N/A N/A Debridement Treatment Procedure was tolerated N/A N/A Response: well Post Debridement 0.3x0.3x0.2 N/A N/A Measurements L x W x D (cm) Post Debridement 0.014 N/A  N/A Volume: (cm) Post Debridement Category/Stage II N/A N/A Stage: Periwound Skin Texture: No Abnormalities Noted N/A N/A Periwound Skin No Abnormalities Noted N/A N/A Moisture: Periwound Skin Color: Erythema: Yes N/A N/A Erythema Location: Circumferential N/A N/A Temperature: No Abnormality N/A N/A Tenderness on Yes N/A N/A Palpation: Wound Preparation: Ulcer Cleansing: N/A N/A Rinsed/Irrigated with Saline Topical Anesthetic Applied: Other: lidocaine 4% Procedures Performed: Debridement N/A N/A Treatment Notes Wound #1 (Left, Lateral Malleolus) 1. Cleansed with: Clean wound with Normal Saline 2. Anesthetic Topical Lidocaine 4% cream to wound bed prior to debridement DIONDRE, PULIS. ([id number]) 3. Peri-wound Care: Skin Prep 4. Dressing Applied: Hydrogel Other dressing (specify in notes) 5. Secondary Dressing Applied Bordered Foam Dressing Dry Gauze Notes sorbact Electronic Signature(s) Signed: 08/21/2016 2:47:32 PM By: Christin Fudge MD, FACS Entered By: Christin Fudge on 08/21/2016 14:47:31 [name] ([id number]) -------------------------------------------------------------------------------- Parks Details Patient Name: BOLDEN, HAGERMAN. Date of Service: 08/21/2016 2:30 PM Medical Record Number: [id number] Patient Account Number: [id number] Date of Birth/Sex: 08-May-1934 (82 y.o. Male) Treating RN: Ahmed Prima Primary Care Jolane Bankhead: [name] Other Clinician: Referring Hazael Olveda: [name] Treating Asael Pann/Extender: [name] in Treatment: 12 Active Inactive ` Abuse / Safety / Falls / Self Care Management Nursing Diagnoses: Potential for falls Goals: Patient will remain injury free Date Initiated: 05/29/2016 Target Resolution Date: 08/02/2016 Goal Status: Active Interventions: Assess fall risk on admission and as needed Assess impairment of mobility on admission and as needed per  policy Notes: ` Nutrition Nursing Diagnoses: Imbalanced nutrition Goals: Patient/caregiver agrees to and verbalizes understanding of need to use nutritional supplements and/or vitamins as prescribed Date Initiated: 05/29/2016 Target Resolution Date: 08/02/2016 Goal Status: Active Interventions: Assess patient nutrition upon admission and as needed per policy Notes: ` Orientation to the Wound Care Program Nursing Diagnoses: DEVION, CHRISCOE ([id number]) Knowledge deficit related to the wound healing center program Goals: Patient/caregiver will verbalize understanding of the [place] Date Initiated: 05/29/2016 Target Resolution Date: 06/14/2016 Goal Status: Active Interventions: Provide education on orientation to the wound center Notes: ` Pain, Acute or Chronic Nursing Diagnoses: Pain, acute or chronic: actual or potential Potential alteration in comfort, pain Goals: Patient will verbalize adequate pain control and receive pain control interventions during procedures as needed Date Initiated: 05/29/2016 Target Resolution Date: 08/02/2016 Goal Status: Active Interventions: Complete pain assessment as per visit requirements Notes: ` Wound/Skin Impairment Nursing Diagnoses: Impaired tissue integrity Knowledge deficit related to ulceration/compromised skin integrity Goals: Ulcer/skin breakdown will have a volume reduction of 80% by week 12 Date Initiated: 05/29/2016 Target Resolution Date: 08/02/2016 Goal Status: Active Interventions: Assess patient/caregiver ability to perform ulcer/skin care regimen upon admission and as needed Assess ulceration(s) every visit Notes: EVERITT, WENNER ([id number]) Electronic Signature(s)  Signed: 08/21/2016 4:17:25 PM By: Alric Quan Entered By: Alric Quan on 08/21/2016 14:35:48 [name] ([id number]) -------------------------------------------------------------------------------- Pain Assessment  Details Patient Name: CHLOE, BAIG. Date of Service: 08/21/2016 2:30 PM Medical Record Number: [id number] Patient Account Number: [id number] Date of Birth/Sex: Sep 19, 1934 (82 y.o. Male) Treating RN: Ahmed Prima Primary Care Laveta Gilkey: [name] Other Clinician: Referring Tallan Sandoz: [name] Treating Kingsly Kloepfer/Extender: [name] in Treatment: 12 Active Problems Location of Pain Severity and Description of Pain Patient Has Paino No Site Locations With Dressing Change: No Pain Management and Medication Current Pain Management: Electronic Signature(s) Signed: 08/21/2016 4:17:25 PM By: Alric Quan Entered By: Alric Quan on 08/21/2016 14:25:48 [name] ([id number]) -------------------------------------------------------------------------------- Patient/Caregiver Education Details Patient Name: SUTTER, AHLGREN. Date of Service: 08/21/2016 2:30 PM Medical Record Number: [id number] Patient Account Number: [id number] Date of Birth/Gender: 06-Apr-1935 (82 y.o. Male) Treating RN: Ahmed Prima Primary Care Physician: [name] Other Clinician: Referring Physician: [name] Treating Physician/Extender: [name] in Treatment: 12 Education Assessment Education Provided To: Patient Education Topics Provided Wound/Skin Impairment: Handouts: Other: change dressing as ordered Methods: Demonstration, Explain/Verbal Responses: State content correctly Electronic Signature(s) Signed: 08/21/2016 4:17:25 PM By: Alric Quan Entered By: Alric Quan on 08/21/2016 14:40:09 [name] ([id number]) -------------------------------------------------------------------------------- Wound Assessment Details Patient Name: YIDEL, TEUSCHER. Date of Service: 08/21/2016 2:30 PM Medical Record Number: [id number] Patient Account Number: [id number] Date of Birth/Sex: 10/30/1934 (82 y.o. Male) Treating RN: Ahmed Prima Primary Care Lennis Rader: [name] Other Clinician: Referring Elias Bordner: [name] Treating Linnette Panella/Extender: [name] in Treatment: 12 Wound Status Wound Number: 1 Primary Pressure Ulcer Etiology: Wound Location: Left Malleolus - Lateral Wound Open Wounding Event: Pressure Injury Status: Date Acquired: 05/01/2016 Comorbid Cataracts, Anemia, Sleep Apnea, Weeks Of Treatment: 12 History: Arrhythmia, Hypertension, Dementia Clustered Wound: No Photos Photo Uploaded By: Alric Quan on 08/21/2016 16:13:11 Wound Measurements Length: (cm) 0.3 Width: (cm) 0.3 Depth: (cm) 0.2 Area: (cm) 0.071 Volume: (cm) 0.014 % Reduction in Area: 63.8% % Reduction in Volume: 30% Epithelialization: None Tunneling: No Undermining: No Wound Description Classification: Category/Stage II Foul Odor Aft Wound Margin: Distinct, outline attached Slough/Fibrin Exudate Amount: Large Exudate Type: Serous Exudate Color: amber er Cleansing: No o No Wound Bed Granulation Amount: Medium (34-66%) Exposed Structure Granulation Quality: Pink Fat Layer (Subcutaneous Tissue) Exposed: Yes Necrotic Amount: Medium (34-66%) Necrotic Quality: Eschar, [address], [place]. ([id number]) Periwound Skin Texture Texture Color No Abnormalities Noted: No No Abnormalities Noted: No Erythema: Yes Moisture Erythema Location: Circumferential No Abnormalities Noted: No Temperature / Pain Temperature: No Abnormality Tenderness on Palpation: Yes Wound Preparation Ulcer Cleansing: Rinsed/Irrigated with Saline Topical Anesthetic Applied: Other: lidocaine 4%, Treatment Notes Wound #1 (Left, Lateral Malleolus) 1. Cleansed with: Clean wound with Normal Saline 2. Anesthetic Topical Lidocaine 4% cream to wound bed prior to debridement 3. Peri-wound Care: Skin Prep 4. Dressing Applied: Hydrogel Other dressing (specify in notes) 5. Secondary Dressing Applied Bordered  Foam Dressing Dry Gauze Notes sorbact Electronic Signature(s) Signed: 08/21/2016 4:17:25 PM By: Alric Quan Entered By: Alric Quan on 08/21/2016 14:34:28 [name] ([id number]) -------------------------------------------------------------------------------- Vitals Details Patient Name: OPAL, MCKELLIPS. Date of Service: 08/21/2016 2:30 PM Medical Record Number: [id number] Patient Account Number: [id number] Date of Birth/Sex: 01-24-1935 (82 y.o. Male) Treating RN: Ahmed Prima Primary Care Gracielynn Birkel: [name] Other Clinician: Referring Rashi Granier: [name] Treating Azana Kiesler/Extender: [name] in Treatment: 12 Vital Signs Time Taken: 14:25 Pulse (bpm): 52 Height (in):  69 Respiratory Rate (breaths/min): 18 Weight (lbs): 174.8 Blood Pressure (mmHg): 128/58 Body Mass Index (BMI): 25.8 Reference Range: 80 - 120 mg / dl Electronic Signature(s) Signed: 08/21/2016 4:17:25 PM By: Alric Quan Entered By: Alric Quan on 08/21/2016 14:28:37

## 2016-08-26 ENCOUNTER — Ambulatory Visit: Payer: PPO | Admitting: Physical Therapy

## 2016-08-26 DIAGNOSIS — M533 Sacrococcygeal disorders, not elsewhere classified: Secondary | ICD-10-CM | POA: Diagnosis not present

## 2016-08-28 ENCOUNTER — Encounter: Payer: PPO | Admitting: Surgery

## 2016-08-28 ENCOUNTER — Ambulatory Visit: Payer: PPO | Admitting: Physical Therapy

## 2016-08-28 DIAGNOSIS — L89523 Pressure ulcer of left ankle, stage 3: Secondary | ICD-10-CM | POA: Diagnosis not present

## 2016-08-28 DIAGNOSIS — L89522 Pressure ulcer of left ankle, stage 2: Secondary | ICD-10-CM | POA: Diagnosis not present

## 2016-08-29 DIAGNOSIS — L89520 Pressure ulcer of left ankle, unstageable: Secondary | ICD-10-CM | POA: Diagnosis not present

## 2016-08-29 NOTE — Progress Notes (Signed)
AN, SCHNABEL ([id number]) Visit Report for 08/28/2016 Arrival Information Details Patient Name: [name], [name]. Date of Service: 08/28/2016 2:30 PM Medical Record Number: [id number] Patient Account Number: [id number] Date of Birth/Sex: 1935-01-23 (82 y.o. Male) Treating RN: Montey Hora Primary Care Mariyah Upshaw: [name] Other Clinician: Referring Jordin Dambrosio: [name] Treating Lile Mccurley/Extender: [name] in Treatment: 13 Visit Information History Since Last Visit Added or deleted any medications: No Patient Arrived: [name] Any new allergies or adverse reactions: No Arrival Time: 14:43 Had a fall or experienced change in No Accompanied By: spouse activities of daily living that may affect Transfer Assistance: None risk of falls: Patient Identification Verified: Yes Signs or symptoms of abuse/neglect since last No Secondary Verification Process Completed: Yes visito Patient Requires Transmission-Based No Hospitalized since last visit: No Precautions: Has Dressing in Place as Prescribed: Yes Patient Has Alerts: Yes Pain Present Now: No Patient Alerts: ASA Electronic Signature(s) Signed: 08/28/2016 5:14:16 PM By: Montey Hora Entered By: Montey Hora on 08/28/2016 14:43:44 [name] ([id number]) -------------------------------------------------------------------------------- Clinic Level of Care Assessment Details Patient Name: [name]. Date of Service: 08/28/2016 2:30 PM Medical Record Number: [id number] Patient Account Number: [id number] Date of Birth/Sex: [birth year] (82 y.o. Male) Treating RN: Montey Hora Primary Care Phung Kotas: [name] Other Clinician: Referring Cassondra Stachowski: [name] Treating Shenee Wignall/Extender: [name] in Treatment: 13 Clinic Level of Care Assessment Items TOOL 4 Quantity Score []  - Use when only an EandM is performed on FOLLOW-UP visit 0 ASSESSMENTS - Nursing Assessment /  Reassessment X - Reassessment of Co-morbidities (includes updates in patient status) 1 10 X - Reassessment of Adherence to Treatment Plan 1 5 ASSESSMENTS - Wound and Skin Assessment / Reassessment X - Simple Wound Assessment / Reassessment - one wound 1 5 []  - Complex Wound Assessment / Reassessment - multiple wounds 0 []  - Dermatologic / Skin Assessment (not related to wound area) 0 ASSESSMENTS - Focused Assessment []  - Circumferential Edema Measurements - multi extremities 0 []  - Nutritional Assessment / Counseling / Intervention 0 X - Lower Extremity Assessment (monofilament, tuning fork, pulses) 1 5 []  - Peripheral Arterial Disease Assessment (using hand held doppler) 0 ASSESSMENTS - Ostomy and/or Continence Assessment and Care []  - Incontinence Assessment and Management 0 []  - Ostomy Care Assessment and Management (repouching, etc.) 0 PROCESS - Coordination of Care X - Simple Patient / Family Education for ongoing care 1 15 []  - Complex (extensive) Patient / Family Education for ongoing care 0 []  - Staff obtains Programmer, systems, Records, Test Results / Process Orders 0 []  - Staff telephones HHA, Nursing Homes / Clarify orders / etc 0 []  - Routine Transfer to another Facility (non-emergent condition) 0 UCHECHUKW[facility], DHAWAN. ([id number]) []  - Routine Hospital Admission (non-emergent condition) 0 []  - New Admissions / Biomedical engineer / Ordering NPWT, Apligraf, etc. 0 []  - Emergency Hospital Admission (emergent condition) 0 X - Simple Discharge Coordination 1 10 []  - Complex (extensive) Discharge Coordination 0 PROCESS - Special Needs []  - Pediatric / Minor Patient Management 0 []  - Isolation Patient Management 0 []  - Hearing / Language / Visual special needs 0 []  - Assessment of Community assistance (transportation, D/C planning, etc.) 0 []  - Additional assistance / Altered mentation 0 []  - Support Surface(s) Assessment (bed, cushion, seat, etc.) 0 INTERVENTIONS - Wound Cleansing /  Measurement X - Simple Wound Cleansing - one wound 1 5 []  - Complex Wound Cleansing - multiple wounds 0 X - Wound Imaging (photographs - any  number of wounds) 1 5 []  - Wound Tracing (instead of photographs) 0 X - Simple Wound Measurement - one wound 1 5 []  - Complex Wound Measurement - multiple wounds 0 INTERVENTIONS - Wound Dressings X - Small Wound Dressing one or multiple wounds 1 10 []  - Medium Wound Dressing one or multiple wounds 0 []  - Large Wound Dressing one or multiple wounds 0 []  - Application of Medications - topical 0 []  - Application of Medications - injection 0 INTERVENTIONS - Miscellaneous []  - External ear exam 0 BUDD, FREIERMUTH. ([id number]) []  - Specimen Collection (cultures, biopsies, blood, body fluids, etc.) 0 []  - Specimen(s) / Culture(s) sent or taken to Lab for analysis 0 []  - Patient Transfer (multiple staff / [name] / Similar devices) 0 []  - Simple Staple / Suture removal (25 or less) 0 []  - Complex Staple / Suture removal (26 or more) 0 []  - Hypo / Hyperglycemic Management (close monitor of Blood Glucose) 0 []  - Ankle / Brachial Index (ABI) - do not check if billed separately 0 X - Vital Signs 1 5 Has the patient been seen at the hospital within the last three years: Yes Total Score: 80 Level Of Care: New/Established - Level 3 Electronic Signature(s) Signed: 08/28/2016 5:14:16 PM By: Montey Hora Entered By: Montey Hora on 08/28/2016 15:06:18 [name] ([id number]) -------------------------------------------------------------------------------- Encounter Discharge Information Details Patient Name: [name], [name]. Date of Service: 08/28/2016 2:30 PM Medical Record Number: [id number] Patient Account Number: [id number] Date of Birth/Sex: Apr 07, 1935 (82 y.o. Male) Treating RN: Montey Hora Primary Care Jensine Luz: [name] Other Clinician: Referring Constancia Geeting: [name] Treating Jlynn Langille/Extender: [name] in  Treatment: 63 Encounter Discharge Information Items Discharge Pain Level: 0 Discharge Condition: Stable Ambulatory Status: Cane Discharge Destination: Home Transportation: Private Auto Accompanied By: spouse Schedule Follow-up Appointment: Yes Medication Reconciliation completed No and provided to Patient/Care Bekim Werntz: Provided on Clinical Summary of Care: 08/28/2016 Form Type Recipient Paper Patient ET Electronic Signature(s) Signed: 08/28/2016 3:14:53 PM By: Ruthine Dose Entered By: Ruthine Dose on 08/28/2016 15:14:53 [name] ([id number]) -------------------------------------------------------------------------------- Lower Extremity Assessment Details Patient Name: [name]. Date of Service: 08/28/2016 2:30 PM Medical Record Number: [id number] Patient Account Number: [id number] Date of Birth/Sex: [birth year] (82 y.o. Male) Treating RN: Montey Hora Primary Care Earlee Herald: [name] Other Clinician: Referring Harlee Pursifull: [name] Treating Skyelar Swigart/Extender: [name] in Treatment: 13 Vascular Assessment Pulses: Dorsalis Pedis Palpable: [Left:Yes] Posterior Tibial Extremity colors, hair growth, and conditions: Extremity Color: [Left:Normal] Hair Growth on Extremity: [Left:Yes] Temperature of Extremity: [Left:Warm] Capillary Refill: [Left:< 3 seconds] Electronic Signature(s) Signed: 08/28/2016 5:14:16 PM By: Montey Hora Entered By: Montey Hora on 08/28/2016 14:51:00 [name] ([id number]) -------------------------------------------------------------------------------- Multi Wound Chart Details Patient Name: [name]. Date of Service: 08/28/2016 2:30 PM Medical Record Number: [id number] Patient Account Number: [id number] Date of Birth/Sex: 1934/12/01 (82 y.o. Male) Treating RN: Montey Hora Primary Care Glynn Freas: [name] Other Clinician: Referring Elbridge Magowan: [name] Treating  Aleighna Wojtas/Extender: [name] in Treatment: 13 Vital Signs Height(in): 69 Pulse(bpm): 61 Weight(lbs): 174.8 Blood Pressure 143/60 (mmHg): Body Mass Index(BMI): 26 Temperature(F): 97.6 Respiratory Rate 18 (breaths/min): Photos: [1:No Photos] [N/A:N/A] Wound Location: [1:Left Malleolus - Lateral] [N/A:N/A] Wounding Event: [1:Pressure Injury] [N/A:N/A] Primary Etiology: [1:Pressure Ulcer] [N/A:N/A] Comorbid History: [1:Cataracts, Anemia, Sleep Apnea, Arrhythmia, Hypertension, Dementia] [N/A:N/A] Date Acquired: [1:05/01/2016] [N/A:N/A] Weeks of Treatment: [1:13] [N/A:N/A] Wound Status: [1:Open] [N/A:N/A] Measurements L x W x D 0.2x0.1x0.1 [N/A:N/A] (cm) Area (cm) : [  1:0.016] [N/A:N/A] Volume (cm) : [1:0.002] [N/A:N/A] % Reduction in Area: [1:91.80%] [N/A:N/A] % Reduction in Volume: 90.00% [N/A:N/A] Classification: [1:Category/Stage II] [N/A:N/A] Exudate Amount: [1:Large] [N/A:N/A] Exudate Type: [1:Serous] [N/A:N/A] Exudate Color: [1:amber] [N/A:N/A] Wound Margin: [1:Distinct, outline attached] [N/A:N/A] Granulation Amount: [1:Medium (34-66%)] [N/A:N/A] Granulation Quality: [1:Pink] [N/A:N/A] Necrotic Amount: [1:Medium (34-66%)] [N/A:N/A] Necrotic Tissue: [1:Eschar, Adherent Slough] [N/A:N/A] Exposed Structures: [1:Fat Layer (Subcutaneous Tissue) Exposed: Yes] [N/A:N/A] Epithelialization: [1:None] [N/A:N/A] Periwound Skin Texture: No Abnormalities Noted [N/A:N/A] Periwound Skin No Abnormalities Noted N/A N/A Moisture: Periwound Skin Color: Erythema: Yes N/A N/A Erythema Location: Circumferential N/A N/A Temperature: No Abnormality N/A N/A Tenderness on Yes N/A N/A Palpation: Wound Preparation: Ulcer Cleansing: N/A N/A Rinsed/Irrigated with Saline Topical Anesthetic Applied: Other: lidocaine 4% Treatment Notes Wound #1 (Left, Lateral Malleolus) 1. Cleansed with: Clean wound with Normal Saline 2. Anesthetic Topical Lidocaine 4% cream to wound bed  prior to debridement 4. Dressing Applied: Hydrogel Other dressing (specify in notes) 5. Secondary Dressing Applied Bordered Foam Dressing Notes sorbact Electronic Signature(s) Signed: 08/28/2016 3:09:27 PM By: Christin Fudge MD, FACS Entered By: Christin Fudge on 08/28/2016 15:09:27 [name] ([id number]) -------------------------------------------------------------------------------- [place] Details Patient Name: [name], [name]. Date of Service: 08/28/2016 2:30 PM Medical Record Number: [id number] Patient Account Number: [id number] Date of Birth/Sex: 03-08-1935 (82 y.o. Male) Treating RN: Montey Hora Primary Care Avonna Iribe: [name] Other Clinician: Referring Jacalyn Biggs: [name] Treating Mikey Maffett/Extender: [name] in Treatment: 36 Active Inactive ` Abuse / Safety / Falls / Self Care Management Nursing Diagnoses: Potential for falls Goals: Patient will remain injury free Date Initiated: 05/29/2016 Target Resolution Date: 08/02/2016 Goal Status: Active Interventions: Assess fall risk on admission and as needed Assess impairment of mobility on admission and as needed per policy Notes: ` Nutrition Nursing Diagnoses: Imbalanced nutrition Goals: Patient/caregiver agrees to and verbalizes understanding of need to use nutritional supplements and/or vitamins as prescribed Date Initiated: 05/29/2016 Target Resolution Date: 08/02/2016 Goal Status: Active Interventions: Assess patient nutrition upon admission and as needed per policy Notes: ` Orientation to the Wound Care Program Nursing Diagnoses: HILDRETH, ROBART ([id number]) Knowledge deficit related to the wound healing center program Goals: Patient/caregiver will verbalize understanding of the [facility] Program Date Initiated: 05/29/2016 Target Resolution Date: 06/14/2016 Goal Status: Active Interventions: Provide education on orientation to the wound  center Notes: ` Pain, Acute or Chronic Nursing Diagnoses: Pain, acute or chronic: actual or potential Potential alteration in comfort, pain Goals: Patient will verbalize adequate pain control and receive pain control interventions during procedures as needed Date Initiated: 05/29/2016 Target Resolution Date: 08/02/2016 Goal Status: Active Interventions: Complete pain assessment as per visit requirements Notes: ` Wound/Skin Impairment Nursing Diagnoses: Impaired tissue integrity Knowledge deficit related to ulceration/compromised skin integrity Goals: Ulcer/skin breakdown will have a volume reduction of 80% by week 12 Date Initiated: 05/29/2016 Target Resolution Date: 08/02/2016 Goal Status: Active Interventions: Assess patient/caregiver ability to perform ulcer/skin care regimen upon admission and as needed Assess ulceration(s) every visit Notes: ERIE, RADU ([id number]) Electronic Signature(s) Signed: 08/28/2016 5:14:16 PM By: Montey Hora Entered By: Montey Hora on 08/28/2016 14:51:09 [name] ([id number]) -------------------------------------------------------------------------------- Pain Assessment Details Patient Name: [name], [name]. Date of Service: 08/28/2016 2:30 PM Medical Record Number: [id number] Patient Account Number: [id number] Date of Birth/Sex: [birth year] (82 y.o. Male) Treating RN: Montey Hora Primary Care Hays Dunnigan: [name] Other Clinician: Referring Caidon Foti: [name] Treating Joy Reiger/Extender: [name] in Treatment: 13 Active Problems Location of Pain Severity and  Description of Pain Patient Has Paino No Site Locations Pain Management and Medication Current Pain Management: Notes Topical or injectable lidocaine is offered to patient for acute pain when surgical debridement is performed. If needed, Patient is instructed to use over the counter pain medication for the following 24-48 hours  after debridement. Wound care MDs do not prescribed pain medications. Patient has chronic pain or uncontrolled pain. Patient has been instructed to make an appointment with their Primary Care Physician for pain management. Electronic Signature(s) Signed: 08/28/2016 5:14:16 PM By: Montey Hora Entered By: Montey Hora on 08/28/2016 14:45:07 [name] ([id number]) -------------------------------------------------------------------------------- Patient/Caregiver Education Details Patient Name: [name], [name]. Date of Service: 08/28/2016 2:30 PM Medical Record Number: [id number] Patient Account Number: [id number] Date of Birth/Gender: Apr 02, 1935 (82 y.o. Male) Treating RN: Montey Hora Primary Care Physician: [name] Other Clinician: Referring Physician: [name] Treating Physician/Extender: [name] in Treatment: 13 Education Assessment Education Provided To: Patient Education Topics Provided Wound/Skin Impairment: Handouts: Other: wound care as ordered Methods: Demonstration, Explain/Verbal Responses: State content correctly Electronic Signature(s) Signed: 08/28/2016 5:14:16 PM By: Montey Hora Entered By: Montey Hora on 08/28/2016 15:03:59 [name] ([id number]) -------------------------------------------------------------------------------- Wound Assessment Details Patient Name: [name], [name]. Date of Service: 08/28/2016 2:30 PM Medical Record Number: [id number] Patient Account Number: [id number] Date of Birth/Sex: Nov 09, 1934 (82 y.o. Male) Treating RN: Montey Hora Primary Care Riona Lahti: [name] Other Clinician: Referring Lauralyn Shadowens: [name] Treating Marye Eagen/Extender: [name] in Treatment: 13 Wound Status Wound Number: 1 Primary Pressure Ulcer Etiology: Wound Location: Left Malleolus - Lateral Wound Open Wounding Event: Pressure Injury Status: Date Acquired: 05/01/2016 Comorbid  Cataracts, Anemia, Sleep Apnea, Weeks Of Treatment: 13 History: Arrhythmia, Hypertension, Dementia Clustered Wound: No Photos Photo Uploaded By: Montey Hora on 08/28/2016 16:10:10 Wound Measurements Length: (cm) 0.2 Width: (cm) 0.1 Depth: (cm) 0.1 Area: (cm) 0.016 Volume: (cm) 0.002 % Reduction in Area: 91.8% % Reduction in Volume: 90% Epithelialization: None Tunneling: No Undermining: No Wound Description Classification: Category/Stage II Foul Odor Aft Wound Margin: Distinct, outline attached Slough/Fibrin Exudate Amount: Large Exudate Type: Serous Exudate Color: amber er Cleansing: No o No Wound Bed Granulation Amount: Medium (34-66%) Exposed Structure Granulation Quality: Pink Fat Layer (Subcutaneous Tissue) Exposed: Yes Necrotic Amount: Medium (34-66%) Necrotic Quality: Eschar, [address], [place]. ([id number]) Periwound Skin Texture Texture Color No Abnormalities Noted: No No Abnormalities Noted: No Erythema: Yes Moisture Erythema Location: Circumferential No Abnormalities Noted: No Temperature / Pain Temperature: No Abnormality Tenderness on Palpation: Yes Wound Preparation Ulcer Cleansing: Rinsed/Irrigated with Saline Topical Anesthetic Applied: Other: lidocaine 4%, Treatment Notes Wound #1 (Left, Lateral Malleolus) 1. Cleansed with: Clean wound with Normal Saline 2. Anesthetic Topical Lidocaine 4% cream to wound bed prior to debridement 4. Dressing Applied: Hydrogel Other dressing (specify in notes) 5. Secondary Dressing Applied Bordered Foam Dressing Notes sorbact Electronic Signature(s) Signed: 08/28/2016 5:14:16 PM By: Montey Hora Entered By: Montey Hora on 08/28/2016 14:50:30 [name] ([id number]) -------------------------------------------------------------------------------- Vitals Details Patient Name: [name], [name]. Date of Service: 08/28/2016 2:30 PM Medical Record Number: [id number] Patient Account  Number: [id number] Date of Birth/Sex: 24-Sep-1934 (82 y.o. Male) Treating RN: Montey Hora Primary Care Kadian Barcellos: [name] Other Clinician: Referring Elijah Michaelis: [name] Treating Celeste Candelas/Extender: [name] in Treatment: 13 Vital Signs Time Taken: 14:45 Temperature (F): 97.6 Height (in): 69 Pulse (bpm): 61 Weight (lbs): 174.8 Respiratory Rate (breaths/min): 18 Body Mass Index (BMI): 25.8 Blood Pressure (mmHg): 143/60 Reference Range: 80 -  120 mg / dl Electronic Signature(s) Signed: 08/28/2016 5:14:16 PM By: Montey Hora Entered By: Montey Hora on 08/28/2016 14:47:04

## 2016-08-29 NOTE — Progress Notes (Signed)
WILDER, KUROWSKI ([id number]) Visit Report for 08/28/2016 Chief Complaint Document Details Patient Name: JAMARIOUS, FEBO. Date of Service: 08/28/2016 2:30 PM Medical Record Number: [id number] Patient Account Number: [id number] Date of Birth/Sex: [birth year] (82 y.o. Male) Treating RN: Montey Hora Primary Care Provider: [name] Other Clinician: Referring Provider: [name] Treating Provider/Extender: [name] in Treatment: 13 Information Obtained from: Patient Chief Complaint Patient is at the clinic for treatment of an open pressure ulcer to the left ankle which she's had for about 5 weeks Electronic Signature(s) Signed: 08/28/2016 3:09:35 PM By: Christin Fudge MD, FACS Entered By: Christin Fudge on 08/28/2016 15:09:35 [name] ([id number]) -------------------------------------------------------------------------------- HPI Details Patient Name: WAKE, CONLEE. Date of Service: 08/28/2016 2:30 PM Medical Record Number: [id number] Patient Account Number: [id number] Date of Birth/Sex: 19-Jul-1934 (82 y.o. Male) Treating RN: Montey Hora Primary Care Provider: [name] Other Clinician: Referring Provider: [name] Treating Provider/Extender: [name] in Treatment: 13 History of Present Illness Location: left ankle laterally Quality: Patient reports experiencing a dull pain to affected area(s). Severity: Patient states wound are getting worse. Duration: Patient has had the wound for < 5 weeks prior to presenting for treatment Timing: Pain in wound is Intermittent (comes and goes Context: The wound appeared gradually over time Modifying Factors: Other treatment(s) tried include:put on Bactroban and doxycycline Associated Signs and Symptoms: Patient reports having difficulty standing for long periods. HPI Description: 82 year old gentleman who is known to have a history of dementia but is fairly functional and is able to  ambulate has been having a pressure ulcer to the left ankle due to laying in bed for longer than he should. Past history of atrial fibrillation, dementia, hyperlipidemia, hypertension. He's also had some orthopedic related surgeries in the past and was a former smoker and is given up in 1969. Recently seen by his PCP who put him on doxycycline and asked him to apply Bactroban and see [place] at the wound clinic. 06/05/2016 -- had a x-ray of the left ankle which showed no acute bony pathology. Electronic Signature(s) Signed: 08/28/2016 3:09:40 PM By: Christin Fudge MD, FACS Entered By: Christin Fudge on 08/28/2016 15:09:39 [name] ([id number]) -------------------------------------------------------------------------------- Physical Exam Details Patient Name: TYLLER, BOWLBY. Date of Service: 08/28/2016 2:30 PM Medical Record Number: [id number] Patient Account Number: [id number] Date of Birth/Sex: [birth year] (82 y.o. Male) Treating RN: Montey Hora Primary Care Provider: [name] Other Clinician: Referring Provider: [name] Treating Provider/Extender: [name] in Treatment: 13 Constitutional . Pulse regular. Respirations normal and unlabored. Afebrile. . Eyes Nonicteric. Reactive to light. Ears, Nose, Mouth, and Throat Lips, teeth, and gums WNL.Marland Kitchen Moist mucosa without lesions. Neck supple and nontender. No palpable supraclavicular or cervical adenopathy. Normal sized without goiter. Respiratory WNL. No retractions.. Breath sounds WNL, No rubs, rales, rhonchi, or wheeze.. Cardiovascular Heart rhythm and rate regular, no murmur or gallop.. Pedal Pulses WNL. No clubbing, cyanosis or edema. Chest Breasts symmetical and no nipple discharge.. Breast tissue WNL, no masses, lumps, or tenderness.. Lymphatic No adneopathy. No adenopathy. No adenopathy. Musculoskeletal Adexa without tenderness or enlargement.. Digits and nails w/o clubbing, cyanosis, infection,  petechiae, ischemia, or inflammatory conditions.. Integumentary (Hair, Skin) No suspicious lesions. No crepitus or fluctuance. No peri-wound warmth or erythema. No masses.Marland Kitchen Psychiatric Judgement and insight Intact.. No evidence of depression, anxiety, or agitation.. Notes the wound is looking much better today and this is the best it has looked in a long time. It barely admits  the tip of a fine probe and there is good resolution from the edges Electronic Signature(s) Signed: 08/28/2016 3:10:06 PM By: Christin Fudge MD, FACS Entered By: Christin Fudge on 08/28/2016 15:10:05 [name] ([id number]) -------------------------------------------------------------------------------- Physician Orders Details Patient Name: TATEN, MERROW. Date of Service: 08/28/2016 2:30 PM Medical Record Number: [id number] Patient Account Number: [id number] Date of Birth/Sex: 1935/05/01 (82 y.o. Male) Treating RN: Montey Hora Primary Care Provider: [name] Other Clinician: Referring Provider: [name] Treating Provider/Extender: [name] in Treatment: 30 Verbal / Phone Orders: No Diagnosis Coding Wound Cleansing Wound #1 Left,Lateral Malleolus o Clean wound with Normal Saline. o May Shower, gently pat wound dry prior to applying new dressing. Anesthetic Wound #1 Left,Lateral Malleolus o Topical Lidocaine 4% cream applied to wound bed prior to debridement - for clinic use Skin Barriers/Peri-Wound Care Wound #1 Left,Lateral Malleolus o Skin Prep Primary Wound Dressing Wound #1 Left,Lateral Malleolus o Hydrogel o Other: - sorbact Secondary Dressing Wound #1 Left,Lateral Malleolus o Dry Gauze o Boardered Foam Dressing Follow-up Appointments Wound #1 Left,Lateral Malleolus o Return Appointment in 1 week. Edema Control Wound #1 Left,Lateral Malleolus o Elevate legs to the level of the heart and pump ankles as often as possible Off-Loading Wound  #1 Left,Lateral Malleolus o Turn and reposition every 2 hours o Other: - keep pressure off of affected area SOREN, LAZARZ. ([id number]) Additional Orders / Instructions Wound #1 Left,Lateral Malleolus o Increase protein intake. Medications-please add to medication list. Wound #1 Left,Lateral Malleolus o Other: - Vitamin A, Vitamin C, Zinc, MVI Electronic Signature(s) Signed: 08/28/2016 4:50:03 PM By: Christin Fudge MD, FACS Signed: 08/28/2016 5:14:16 PM By: Montey Hora Entered By: Montey Hora on 08/28/2016 15:05:40 [name] ([id number]) -------------------------------------------------------------------------------- Problem List Details Patient Name: SONAM, HUELSMANN. Date of Service: 08/28/2016 2:30 PM Medical Record Number: [id number] Patient Account Number: [id number] Date of Birth/Sex: May 25, 1934 (82 y.o. Male) Treating RN: Montey Hora Primary Care Provider: [name] Other Clinician: Referring Provider: [name] Treating Provider/Extender: [name] in Treatment: 13 Active Problems ICD-10 Encounter Code Description Active Date Diagnosis [phone] Pressure ulcer of left ankle, stage 2 05/29/2016 Yes F02.80 Dementia in other diseases classified elsewhere without 05/29/2016 Yes behavioral disturbance Inactive Problems Resolved Problems Electronic Signature(s) Signed: 08/28/2016 3:09:22 PM By: Christin Fudge MD, FACS Entered By: Christin Fudge on 08/28/2016 15:09:22 [name] ([id number]) -------------------------------------------------------------------------------- Progress Note Details Patient Name: AITAN, ROSSBACH. Date of Service: 08/28/2016 2:30 PM Medical Record Number: [id number] Patient Account Number: [id number] Date of Birth/Sex: Oct 28, 1934 (82 y.o. Male) Treating RN: Montey Hora Primary Care Provider: [name] Other Clinician: Referring Provider: [name] Treating Provider/Extender: [name] in Treatment: 13 Subjective Chief Complaint Information obtained from Patient Patient is at the clinic for treatment of an open pressure ulcer to the left ankle which she's had for about 5 weeks History of Present Illness (HPI) The following HPI elements were documented for the patient's wound: Location: left ankle laterally Quality: Patient reports experiencing a dull pain to affected area(s). Severity: Patient states wound are getting worse. Duration: Patient has had the wound for < 5 weeks prior to presenting for treatment Timing: Pain in wound is Intermittent (comes and goes Context: The wound appeared gradually over time Modifying Factors: Other treatment(s) tried include:put on Bactroban and doxycycline Associated Signs and Symptoms: Patient reports having difficulty standing for long periods. 82 year old gentleman who is known to have a history of dementia but is fairly functional  and is able to ambulate has been having a pressure ulcer to the left ankle due to laying in bed for longer than he should. Past history of atrial fibrillation, dementia, hyperlipidemia, hypertension. He's also had some orthopedic related surgeries in the past and was a former smoker and is given up in 1969. Recently seen by his PCP who put him on doxycycline and asked him to apply Bactroban and see [place] at the wound clinic. 06/05/2016 -- had a x-ray of the left ankle which showed no acute bony pathology. Objective Constitutional Pulse regular. Respirations normal and unlabored. Afebrile. Vitals Time Taken: 2:45 PM, Height: 69 in, Weight: 174.8 lbs, BMI: 25.8, Temperature: 97.6 F, Pulse: 61 bpm, Respiratory Rate: 18 breaths/min, Blood Pressure: 143/60 mmHg. ZENITH, LAMPHIER. ([id number]) Eyes Nonicteric. Reactive to light. Ears, Nose, Mouth, and Throat Lips, teeth, and gums WNL.Marland Kitchen Moist mucosa without lesions. Neck supple and nontender. No palpable supraclavicular or cervical adenopathy.  Normal sized without goiter. Respiratory WNL. No retractions.. Breath sounds WNL, No rubs, rales, rhonchi, or wheeze.. Cardiovascular Heart rhythm and rate regular, no murmur or gallop.. Pedal Pulses WNL. No clubbing, cyanosis or edema. Chest Breasts symmetical and no nipple discharge.. Breast tissue WNL, no masses, lumps, or tenderness.. Lymphatic No adneopathy. No adenopathy. No adenopathy. Musculoskeletal Adexa without tenderness or enlargement.. Digits and nails w/o clubbing, cyanosis, infection, petechiae, ischemia, or inflammatory conditions.Marland Kitchen Psychiatric Judgement and insight Intact.. No evidence of depression, anxiety, or agitation.. General Notes: the wound is looking much better today and this is the best it has looked in a long time. It barely admits the tip of a fine probe and there is good resolution from the edges Integumentary (Hair, Skin) No suspicious lesions. No crepitus or fluctuance. No peri-wound warmth or erythema. No masses.. Wound #1 status is Open. Original cause of wound was Pressure Injury. The wound is located on the Left,Lateral Malleolus. The wound measures 0.2cm length x 0.1cm width x 0.1cm depth; 0.016cm^2 area and 0.002cm^3 volume. There is Fat Layer (Subcutaneous Tissue) Exposed exposed. There is no tunneling or undermining noted. There is a large amount of serous drainage noted. The wound margin is distinct with the outline attached to the wound base. There is medium (34-66%) pink granulation within the wound bed. There is a medium (34-66%) amount of necrotic tissue within the wound bed including Eschar and Adherent Slough. The periwound skin appearance exhibited: Erythema. The surrounding wound skin color is noted with erythema which is circumferential. Periwound temperature was noted as No Abnormality. The periwound has tenderness on palpation. Assessment TERUO, STILLEY ([id number]) Active Problems ICD-10 [phone] - Pressure ulcer of left ankle,  stage 2 F02.80 - Dementia in other diseases classified elsewhere without behavioral disturbance Plan Wound Cleansing: Wound #1 Left,Lateral Malleolus: Clean wound with Normal Saline. May Shower, gently pat wound dry prior to applying new dressing. Anesthetic: Wound #1 Left,Lateral Malleolus: Topical Lidocaine 4% cream applied to wound bed prior to debridement - for clinic use Skin Barriers/Peri-Wound Care: Wound #1 Left,Lateral Malleolus: Skin Prep Primary Wound Dressing: Wound #1 Left,Lateral Malleolus: Hydrogel Other: - sorbact Secondary Dressing: Wound #1 Left,Lateral Malleolus: Dry Gauze Boardered Foam Dressing Follow-up Appointments: Wound #1 Left,Lateral Malleolus: Return Appointment in 1 week. Edema Control: Wound #1 Left,Lateral Malleolus: Elevate legs to the level of the heart and pump ankles as often as possible Off-Loading: Wound #1 Left,Lateral Malleolus: Turn and reposition every 2 hours Other: - keep pressure off of affected area Additional Orders / Instructions: Wound #1 Left,Lateral Malleolus: Increase protein  intake. Medications-please add to medication list.: Wound #1 Left,Lateral Malleolus: Other: - Vitamin A, Vitamin C, Zinc, MVI Muro, Tarvis M. ([id number]) I have recommended: 1. Sorbact with hydrogel to be placed into the wound and a protective bordered foam. 2. Off-Loading has been discussed in great detail 3. adequate protein, vitamin A, vitamin C and zinc 4. Regular visits to the wound center. Electronic Signature(s) Signed: 08/28/2016 3:11:17 PM By: Christin Fudge MD, FACS Entered By: Christin Fudge on 08/28/2016 15:11:17 [name] ([id number]) -------------------------------------------------------------------------------- SuperBill Details Patient Name: DEMOSTHENES, VIRNIG. Date of Service: 08/28/2016 Medical Record Number: [id number] Patient Account Number: [id number] Date of Birth/Sex: 01-Feb-1935 (82 y.o. Male) Treating RN: Montey Hora Primary Care Provider: [name] Other Clinician: Referring Provider: [name] Treating Provider/Extender: [name] in Treatment: 13 Diagnosis Coding ICD-10 Codes Code Description [phone] Pressure ulcer of left ankle, stage 2 F02.80 Dementia in other diseases classified elsewhere without behavioral disturbance Facility Procedures CPT4 Code: [id number] Description: 99213 - WOUND CARE VISIT-LEV 3 EST PT Modifier: Quantity: 1 Physician Procedures CPT4: Description Modifier Quantity Code [id number] 99213 - WC PHYS LEVEL 3 - EST PT 1 ICD-10 Description Diagnosis L89.522 Pressure ulcer of left ankle, stage 2 F02.80 Dementia in other diseases classified elsewhere without behavioral disturbance Electronic Signature(s) Signed: 08/28/2016 3:11:31 PM By: Christin Fudge MD, FACS Entered By: Christin Fudge on 08/28/2016 15:11:31

## 2016-09-04 ENCOUNTER — Encounter: Payer: PPO | Attending: Surgery | Admitting: Surgery

## 2016-09-04 DIAGNOSIS — I10 Essential (primary) hypertension: Secondary | ICD-10-CM | POA: Diagnosis not present

## 2016-09-04 DIAGNOSIS — I4891 Unspecified atrial fibrillation: Secondary | ICD-10-CM | POA: Diagnosis not present

## 2016-09-04 DIAGNOSIS — L89523 Pressure ulcer of left ankle, stage 3: Secondary | ICD-10-CM | POA: Diagnosis not present

## 2016-09-04 DIAGNOSIS — Z87891 Personal history of nicotine dependence: Secondary | ICD-10-CM | POA: Diagnosis not present

## 2016-09-04 DIAGNOSIS — F0280 Dementia in other diseases classified elsewhere without behavioral disturbance: Secondary | ICD-10-CM | POA: Insufficient documentation

## 2016-09-04 DIAGNOSIS — E785 Hyperlipidemia, unspecified: Secondary | ICD-10-CM | POA: Diagnosis not present

## 2016-09-04 DIAGNOSIS — L89522 Pressure ulcer of left ankle, stage 2: Secondary | ICD-10-CM | POA: Insufficient documentation

## 2016-09-04 DIAGNOSIS — M461 Sacroiliitis, not elsewhere classified: Secondary | ICD-10-CM | POA: Diagnosis not present

## 2016-09-04 DIAGNOSIS — M545 Low back pain: Secondary | ICD-10-CM | POA: Diagnosis not present

## 2016-09-04 DIAGNOSIS — D649 Anemia, unspecified: Secondary | ICD-10-CM | POA: Insufficient documentation

## 2016-09-04 DIAGNOSIS — M9903 Segmental and somatic dysfunction of lumbar region: Secondary | ICD-10-CM | POA: Diagnosis not present

## 2016-09-04 DIAGNOSIS — M9904 Segmental and somatic dysfunction of sacral region: Secondary | ICD-10-CM | POA: Diagnosis not present

## 2016-09-05 DIAGNOSIS — M9904 Segmental and somatic dysfunction of sacral region: Secondary | ICD-10-CM | POA: Diagnosis not present

## 2016-09-05 DIAGNOSIS — M461 Sacroiliitis, not elsewhere classified: Secondary | ICD-10-CM | POA: Diagnosis not present

## 2016-09-05 DIAGNOSIS — M9903 Segmental and somatic dysfunction of lumbar region: Secondary | ICD-10-CM | POA: Diagnosis not present

## 2016-09-05 DIAGNOSIS — M545 Low back pain: Secondary | ICD-10-CM | POA: Diagnosis not present

## 2016-09-06 NOTE — Progress Notes (Signed)
Jared, Ho ([id number]) Visit Report for 09/04/2016 Chief Complaint Document Details Patient Name: [name], [name]. Date of Service: 09/04/2016 2:30 PM Medical Record Number: [id number] Patient Account Number: [id number] Date of Birth/Sex: Feb 26, 1935 (82 y.o. Male) Treating RN: Ahmed Prima Primary Care Provider: [name] Other Clinician: Referring Provider: [name] Treating Provider/Extender: [name] in Treatment: 14 Information Obtained from: Patient Chief Complaint Patient is at the clinic for treatment of an open pressure ulcer to the left ankle which she's had for about 5 weeks Electronic Signature(s) Signed: 09/04/2016 3:03:19 PM By: Christin Fudge MD, FACS Entered By: Christin Fudge on 09/04/2016 15:03:18 [name] ([id number]) -------------------------------------------------------------------------------- HPI Details Patient Name: Jared, Ho. Date of Service: 09/04/2016 2:30 PM Medical Record Number: [id number] Patient Account Number: [id number] Date of Birth/Sex: June 28, 1934 (82 y.o. Male) Treating RN: Ahmed Prima Primary Care Provider: [name] Other Clinician: Referring Provider: [name] Treating Provider/Extender: [name] in Treatment: 14 History of Present Illness Location: left ankle laterally Quality: Patient reports experiencing a dull pain to affected area(s). Severity: Patient states wound are getting worse. Duration: Patient has had the wound for < 5 weeks prior to presenting for treatment Timing: Pain in wound is Intermittent (comes and goes Context: The wound appeared gradually over time Modifying Factors: Other treatment(s) tried include:put on Bactroban and doxycycline Associated Signs and Symptoms: Patient reports having difficulty standing for long periods. HPI Description: 82 year old gentleman who is known to have a history of dementia but is fairly functional and is able to  ambulate has been having a pressure ulcer to the left ankle due to laying in bed for longer than he should. Past history of atrial fibrillation, dementia, hyperlipidemia, hypertension. He's also had some orthopedic related surgeries in the past and was a former smoker and is given up in 1969. Recently seen by his PCP who put him on doxycycline and asked him to apply Bactroban and see [place] at the wound clinic. 06/05/2016 -- had a x-ray of the left ankle which showed no acute bony pathology. Electronic Signature(s) Signed: 09/04/2016 3:03:29 PM By: Christin Fudge MD, FACS Entered By: Christin Fudge on 09/04/2016 15:03:29 [name] ([id number]) -------------------------------------------------------------------------------- Physical Exam Details Patient Name: Jared, Ho. Date of Service: 09/04/2016 2:30 PM Medical Record Number: [id number] Patient Account Number: [id number] Date of Birth/Sex: 09-14-1934 (82 y.o. Male) Treating RN: Ahmed Prima Primary Care Provider: [name] Other Clinician: Referring Provider: [name] Treating Provider/Extender: [name] in Treatment: 14 Notes the wound is looking excellent today and did not need any sharp debridement. The edges have come in nicely and there is a minimal open area at the center of it. Electronic Signature(s) Signed: 09/04/2016 3:03:52 PM By: Christin Fudge MD, FACS Entered By: Christin Fudge on 09/04/2016 15:03:52 [name] ([id number]) -------------------------------------------------------------------------------- Physician Orders Details Patient Name: Jared, Ho. Date of Service: 09/04/2016 2:30 PM Medical Record Number: [id number] Patient Account Number: [id number] Date of Birth/Sex: [birth year] (82 y.o. Male) Treating RN: Ahmed Prima Primary Care Provider: [name] Other Clinician: Referring Provider: [name] Treating Provider/Extender: [name] in  Treatment: 69 Verbal / Phone Orders: Yes Clinician: Carolyne Fiscal, Debi Read Back and Verified: Yes Diagnosis Coding Wound Cleansing Wound #1 Left,Lateral Malleolus o Clean wound with Normal Saline. o May Shower, gently pat wound dry prior to applying new dressing. Anesthetic Wound #1 Left,Lateral Malleolus o Topical Lidocaine 4% cream applied to wound bed prior to debridement -  for clinic use Skin Barriers/Peri-Wound Care Wound #1 Left,Lateral Malleolus o Skin Prep Primary Wound Dressing Wound #1 Left,Lateral Malleolus o Hydrogel o Other: - sorbact Secondary Dressing Wound #1 Left,Lateral Malleolus o Dry Gauze o Boardered Foam Dressing Follow-up Appointments Wound #1 Left,Lateral Malleolus o Return Appointment in 1 week. Edema Control Wound #1 Left,Lateral Malleolus o Elevate legs to the level of the heart and pump ankles as often as possible Off-Loading Wound #1 Left,Lateral Malleolus o Turn and reposition every 2 hours o Other: - keep pressure off of affected area REGINAL, WOJCICKI. ([id number]) Additional Orders / Instructions Wound #1 Left,Lateral Malleolus o Increase protein intake. Medications-please add to medication list. Wound #1 Left,Lateral Malleolus o Other: - Vitamin A, Vitamin C, Zinc, MVI Electronic Signature(s) Signed: 09/04/2016 3:54:55 PM By: Christin Fudge MD, FACS Entered By: Christin Fudge on 09/04/2016 15:04:06 [name] ([id number]) -------------------------------------------------------------------------------- Problem List Details Patient Name: Ho, Jared. Date of Service: 09/04/2016 2:30 PM Medical Record Number: [id number] Patient Account Number: [id number] Date of Birth/Sex: 1934/12/28 (82 y.o. Male) Treating RN: Ahmed Prima Primary Care Provider: [name] Other Clinician: Referring Provider: [name] Treating Provider/Extender: [name] in Treatment: 14 Active  Problems ICD-10 Encounter Code Description Active Date Diagnosis [phone] Pressure ulcer of left ankle, stage 2 05/29/2016 Yes F02.80 Dementia in other diseases classified elsewhere without 05/29/2016 Yes behavioral disturbance Inactive Problems Resolved Problems Electronic Signature(s) Signed: 09/04/2016 3:03:03 PM By: Christin Fudge MD, FACS Entered By: Christin Fudge on 09/04/2016 15:03:03 [name] ([id number]) -------------------------------------------------------------------------------- Progress Note Details Patient Name: IDUS, RATHKE. Date of Service: 09/04/2016 2:30 PM Medical Record Number: [id number] Patient Account Number: [id number] Date of Birth/Sex: Feb 23, 1935 (82 y.o. Male) Treating RN: Ahmed Prima Primary Care Provider: [name] Other Clinician: Referring Provider: [name] Treating Provider/Extender: [name] in Treatment: 14 Subjective Chief Complaint Information obtained from Patient Patient is at the clinic for treatment of an open pressure ulcer to the left ankle which she's had for about 5 weeks History of Present Illness (HPI) The following HPI elements were documented for the patient's wound: Location: left ankle laterally Quality: Patient reports experiencing a dull pain to affected area(s). Severity: Patient states wound are getting worse. Duration: Patient has had the wound for < 5 weeks prior to presenting for treatment Timing: Pain in wound is Intermittent (comes and goes Context: The wound appeared gradually over time Modifying Factors: Other treatment(s) tried include:put on Bactroban and doxycycline Associated Signs and Symptoms: Patient reports having difficulty standing for long periods. 82 year old gentleman who is known to have a history of dementia but is fairly functional and is able to ambulate has been having a pressure ulcer to the left ankle due to laying in bed for longer than he should. Past history  of atrial fibrillation, dementia, hyperlipidemia, hypertension. He's also had some orthopedic related surgeries in the past and was a former smoker and is given up in 1969. Recently seen by his PCP who put him on doxycycline and asked him to apply Bactroban and see [place] at the wound clinic. 06/05/2016 -- had a x-ray of the left ankle which showed no acute bony pathology. Objective Constitutional Vitals Time Taken: 2:36 PM, Height: 69 in, Weight: 174.8 lbs, BMI: 25.8, Pulse: 59 bpm, Respiratory Rate: 18 breaths/min, Blood Pressure: 129/64 mmHg. Integumentary (Hair, Skin) Wound #1 status is Open. Original cause of wound was Pressure Injury. The wound is located on the [place]. ([id number]) Left,Lateral Malleolus. The wound  measures 0.2cm length x 0.1cm width x 0.1cm depth; 0.016cm^2 area and 0.002cm^3 volume. There is Fat Layer (Subcutaneous Tissue) Exposed exposed. There is no tunneling or undermining noted. There is a large amount of serous drainage noted. The wound margin is distinct with the outline attached to the wound base. There is medium (34-66%) pink granulation within the wound bed. There is a medium (34-66%) amount of necrotic tissue within the wound bed including Eschar and Adherent Slough. The periwound skin appearance exhibited: Erythema. The surrounding wound skin color is noted with erythema which is circumferential. Periwound temperature was noted as No Abnormality. The periwound has tenderness on palpation. Assessment Active Problems ICD-10 L89.522 - Pressure ulcer of left ankle, stage 2 F02.80 - Dementia in other diseases classified elsewhere without behavioral disturbance Plan Wound Cleansing: Wound #1 Left,Lateral Malleolus: Clean wound with Normal Saline. May Shower, gently pat wound dry prior to applying new dressing. Anesthetic: Wound #1 Left,Lateral Malleolus: Topical Lidocaine 4% cream applied to wound bed prior to debridement - for clinic use Skin  Barriers/Peri-Wound Care: Wound #1 Left,Lateral Malleolus: Skin Prep Primary Wound Dressing: Wound #1 Left,Lateral Malleolus: Hydrogel Other: - sorbact Secondary Dressing: Wound #1 Left,Lateral Malleolus: Dry Gauze Boardered Foam Dressing Follow-up Appointments: Wound #1 Left,Lateral Malleolus: Return Appointment in 1 week. Edema Control: Wound #1 Left,Lateral Malleolus: RISHI, VICARIO. ([id number]) Elevate legs to the level of the heart and pump ankles as often as possible Off-Loading: Wound #1 Left,Lateral Malleolus: Turn and reposition every 2 hours Other: - keep pressure off of affected area Additional Orders / Instructions: Wound #1 Left,Lateral Malleolus: Increase protein intake. Medications-please add to medication list.: Wound #1 Left,Lateral Malleolus: Other: - Vitamin A, Vitamin C, Zinc, MVI Overall there has been excellent improvement over the last 2 weeks. I have recommended: 1. Sorbact with hydrogel to be placed into the wound and a protective bordered foam. 2. Off-Loading has been discussed in great detail 3. adequate protein, vitamin A, vitamin C and zinc 4. Regular visits to the wound center. I anticipate discharge soon Electronic Signature(s) Signed: 09/04/2016 3:05:08 PM By: Christin Fudge MD, FACS Entered By: Christin Fudge on 09/04/2016 15:05:08 [name] ([id number]) -------------------------------------------------------------------------------- SuperBill Details Patient Name: NAHUM, SHERRER. Date of Service: 09/04/2016 Medical Record Number: [id number] Patient Account Number: [id number] Date of Birth/Sex: March 26, 1935 (82 y.o. Male) Treating RN: Ahmed Prima Primary Care Provider: [name] Other Clinician: Referring Provider: [name] Treating Provider/Extender: [name] in Treatment: 14 Diagnosis Coding ICD-10 Codes Code Description [phone] Pressure ulcer of left ankle, stage 2 F02.80 Dementia in other diseases  classified elsewhere without behavioral disturbance Facility Procedures CPT4 Code: [id number] Description: 99213 - WOUND CARE VISIT-LEV 3 EST PT Modifier: Quantity: 1 Physician Procedures CPT4: Description Modifier Quantity Code [id number] 93790 - WC PHYS LEVEL 3 - EST PT 1 ICD-10 Description Diagnosis L89.522 Pressure ulcer of left ankle, stage 2 F02.80 Dementia in other diseases classified elsewhere without behavioral disturbance Electronic Signature(s) Signed: 09/04/2016 3:54:55 PM By: Christin Fudge MD, FACS Signed: 09/04/2016 4:23:06 PM By: Alric Quan Previous Signature: 09/04/2016 3:05:26 PM Version By: Christin Fudge MD, FACS Entered By: Alric Quan on 09/04/2016 15:24:18

## 2016-09-06 NOTE — Progress Notes (Signed)
[name] ([id number]) Visit Report for 09/04/2016 Arrival Information Details Patient Name: [name], [name]. Date of Service: 09/04/2016 2:30 PM Medical Record Number: [id number] Patient Account Number: [id number] Date of Birth/Sex: 09/08/1934 (82 y.o. Male) Treating RN: Ahmed Prima Primary Care Zurich Carreno: [name] Other Clinician: Referring Donie Moulton: [name] Treating Alexya Mcdaris/Extender: [name] in Treatment: 14 Visit Information History Since Last Visit All ordered tests and consults were completed: No Patient Arrived: [name] Added or deleted any medications: No Arrival Time: 14:35 Any new allergies or adverse reactions: No Accompanied By: wife Had a fall or experienced change in No Transfer Assistance: EasyPivot activities of daily living that may affect Patient Lift risk of falls: Patient Identification Verified: Yes Signs or symptoms of abuse/neglect since last No Secondary Verification Process Yes visito Completed: Hospitalized since last visit: No Patient Requires Transmission- No Has Dressing in Place as Prescribed: Yes Based Precautions: Pain Present Now: No Patient Has Alerts: Yes Patient Alerts: ASA Electronic Signature(s) Signed: 09/04/2016 4:23:06 PM By: Alric Quan Entered By: Alric Quan on 09/04/2016 14:36:01 [name] ([id number]) -------------------------------------------------------------------------------- Clinic Level of Care Assessment Details Patient Name: [name]. Date of Service: 09/04/2016 2:30 PM Medical Record Number: [id number] Patient Account Number: [id number] Date of Birth/Sex: 20-Jan-1935 (82 y.o. Male) Treating RN: Ahmed Prima Primary Care Kariem Wolfson: [name] Other Clinician: Referring Ramia Sidney: [name] Treating Hanya Guerin/Extender: [name] in Treatment: 14 Clinic Level of Care Assessment Items TOOL 4 Quantity Score X - Use when only an EandM is  performed on FOLLOW-UP visit 1 0 ASSESSMENTS - Nursing Assessment / Reassessment X - Reassessment of Co-morbidities (includes updates in patient status) 1 10 X - Reassessment of Adherence to Treatment Plan 1 5 ASSESSMENTS - Wound and Skin Assessment / Reassessment X - Simple Wound Assessment / Reassessment - one wound 1 5 []  - Complex Wound Assessment / Reassessment - multiple wounds 0 []  - Dermatologic / Skin Assessment (not related to wound area) 0 ASSESSMENTS - Focused Assessment []  - Circumferential Edema Measurements - multi extremities 0 []  - Nutritional Assessment / Counseling / Intervention 0 []  - Lower Extremity Assessment (monofilament, tuning fork, pulses) 0 []  - Peripheral Arterial Disease Assessment (using hand held doppler) 0 ASSESSMENTS - Ostomy and/or Continence Assessment and Care []  - Incontinence Assessment and Management 0 []  - Ostomy Care Assessment and Management (repouching, etc.) 0 PROCESS - Coordination of Care X - Simple Patient / Family Education for ongoing care 1 15 []  - Complex (extensive) Patient / Family Education for ongoing care 0 []  - Staff obtains Programmer, systems, Records, Test Results / Process Orders 0 []  - Staff telephones HHA, Nursing Homes / Clarify orders / etc 0 []  - Routine Transfer to another Facility (non-emergent condition) 0 [name], [name] ([id number]) []  - Routine Hospital Admission (non-emergent condition) 0 []  - New Admissions / Biomedical engineer / Ordering NPWT, Apligraf, etc. 0 []  - Emergency Hospital Admission (emergent condition) 0 X - Simple Discharge Coordination 1 10 []  - Complex (extensive) Discharge Coordination 0 PROCESS - Special Needs []  - Pediatric / Minor Patient Management 0 []  - Isolation Patient Management 0 []  - Hearing / Language / Visual special needs 0 []  - Assessment of Community assistance (transportation, D/C planning, etc.) 0 []  - Additional assistance / Altered mentation 0 []  - Support Surface(s)  Assessment (bed, cushion, seat, etc.) 0 INTERVENTIONS - Wound Cleansing / Measurement X - Simple Wound Cleansing - one wound 1 5 []  - Complex Wound Cleansing -  multiple wounds 0 X - Wound Imaging (photographs - any number of wounds) 1 5 []  - Wound Tracing (instead of photographs) 0 X - Simple Wound Measurement - one wound 1 5 []  - Complex Wound Measurement - multiple wounds 0 INTERVENTIONS - Wound Dressings X - Small Wound Dressing one or multiple wounds 1 10 []  - Medium Wound Dressing one or multiple wounds 0 []  - Large Wound Dressing one or multiple wounds 0 X - Application of Medications - topical 1 5 []  - Application of Medications - injection 0 INTERVENTIONS - Miscellaneous []  - External ear exam 0 [name], [name] ([id number]) []  - Specimen Collection (cultures, biopsies, blood, body fluids, etc.) 0 []  - Specimen(s) / Culture(s) sent or taken to Lab for analysis 0 []  - Patient Transfer (multiple staff / [name] / Similar devices) 0 []  - Simple Staple / Suture removal (25 or less) 0 []  - Complex Staple / Suture removal (26 or more) 0 []  - Hypo / Hyperglycemic Management (close monitor of Blood Glucose) 0 []  - Ankle / Brachial Index (ABI) - do not check if billed separately 0 X - Vital Signs 1 5 Has the patient been seen at the hospital within the last three years: Yes Total Score: 80 Level Of Care: New/Established - Level 3 Electronic Signature(s) Signed: 09/04/2016 4:23:06 PM By: Alric Quan Entered By: Alric Quan on 09/04/2016 15:24:08 [name] ([id number]) -------------------------------------------------------------------------------- Encounter Discharge Information Details Patient Name: [name], [name]. Date of Service: 09/04/2016 2:30 PM Medical Record Number: [id number] Patient Account Number: [id number] Date of Birth/Sex: Nov 25, 1934 (82 y.o. Male) Treating RN: Ahmed Prima Primary Care Evart Mcdonnell: [name] Other Clinician: Referring  Pearl Berlinger: [name] Treating Lisett Dirusso/Extender: [name] in Treatment: 14 Encounter Discharge Information Items Discharge Pain Level: 0 Discharge Condition: Stable Ambulatory Status: Cane Discharge Destination: Home Transportation: Private Auto Accompanied By: wife Schedule Follow-up Appointment: Yes Medication Reconciliation completed No and provided to Patient/Care Dillie Burandt: Provided on Clinical Summary of Care: 09/04/2016 Form Type Recipient Paper Patient ET Electronic Signature(s) Signed: 09/04/2016 3:01:30 PM By: Ruthine Dose Entered By: Ruthine Dose on 09/04/2016 15:01:30 [name] ([id number]) -------------------------------------------------------------------------------- Lower Extremity Assessment Details Patient Name: [name], [name]. Date of Service: 09/04/2016 2:30 PM Medical Record Number: [id number] Patient Account Number: [id number] Date of Birth/Sex: 1934/10/20 (82 y.o. Male) Treating RN: Ahmed Prima Primary Care Corayma Cashatt: [name] Other Clinician: Referring Kiril Hippe: [name] Treating Mitchell Epling/Extender: [name] in Treatment: 14 Vascular Assessment Pulses: Dorsalis Pedis Palpable: [Left:Yes] Posterior Tibial Extremity colors, hair growth, and conditions: Extremity Color: [Left:Normal] Temperature of Extremity: [Left:Warm] Capillary Refill: [Left:< 3 seconds] Toe Nail Assessment Left: Right: Thick: No Discolored: No Deformed: No Improper Length and Hygiene: No Electronic Signature(s) Signed: 09/04/2016 4:23:06 PM By: Alric Quan Entered By: Alric Quan on 09/04/2016 14:43:39 Fleischhacker, [name] ([id number]) -------------------------------------------------------------------------------- Multi Wound Chart Details Patient Name: [name]. Date of Service: 09/04/2016 2:30 PM Medical Record Number: [id number] Patient Account Number: [id number] Date of Birth/Sex: 11-22-1934 (82 y.o.  Male) Treating RN: Ahmed Prima Primary Care Junelle Hashemi: [name] Other Clinician: Referring Katsumi Wisler: [name] Treating Eular Panek/Extender: [name] in Treatment: 14 Vital Signs Height(in): 69 Pulse(bpm): 59 Weight(lbs): 174.8 Blood Pressure 129/64 (mmHg): Body Mass Index(BMI): 26 Temperature(F): Respiratory Rate 18 (breaths/min): Photos: [1:No Photos] [N/A:N/A] Wound Location: [1:Left Malleolus - Lateral] [N/A:N/A] Wounding Event: [1:Pressure Injury] [N/A:N/A] Primary Etiology: [1:Pressure Ulcer] [N/A:N/A] Comorbid History: [1:Cataracts, Anemia, Sleep Apnea, Arrhythmia, Hypertension, Dementia] [N/A:N/A] Date Acquired: [1:05/01/2016] [  N/A:N/A] Weeks of Treatment: [1:14] [N/A:N/A] Wound Status: [1:Open] [N/A:N/A] Measurements L x W x D 0.2x0.1x0.1 [N/A:N/A] (cm) Area (cm) : [1:0.016] [N/A:N/A] Volume (cm) : [1:0.002] [N/A:N/A] % Reduction in Area: [1:91.80%] [N/A:N/A] % Reduction in Volume: 90.00% [N/A:N/A] Classification: [1:Category/Stage II] [N/A:N/A] Exudate Amount: [1:Large] [N/A:N/A] Exudate Type: [1:Serous] [N/A:N/A] Exudate Color: [1:amber] [N/A:N/A] Wound Margin: [1:Distinct, outline attached] [N/A:N/A] Granulation Amount: [1:Medium (34-66%)] [N/A:N/A] Granulation Quality: [1:Pink] [N/A:N/A] Necrotic Amount: [1:Medium (34-66%)] [N/A:N/A] Necrotic Tissue: [1:Eschar, Adherent Slough] [N/A:N/A] Exposed Structures: [1:Fat Layer (Subcutaneous Tissue) Exposed: Yes] [N/A:N/A] Epithelialization: [1:None] [N/A:N/A] Periwound Skin Texture: No Abnormalities Noted [N/A:N/A] Periwound Skin No Abnormalities Noted N/A N/A Moisture: Periwound Skin Color: Erythema: Yes N/A N/A Erythema Location: Circumferential N/A N/A Temperature: No Abnormality N/A N/A Tenderness on Yes N/A N/A Palpation: Wound Preparation: Ulcer Cleansing: N/A N/A Rinsed/Irrigated with Saline Topical Anesthetic Applied: Other: lidocaine 4% Treatment Notes Wound  #1 (Left, Lateral Malleolus) 1. Cleansed with: Clean wound with Normal Saline 2. Anesthetic Topical Lidocaine 4% cream to wound bed prior to debridement 3. Peri-wound Care: Skin Prep 4. Dressing Applied: Hydrogel Other dressing (specify in notes) 5. Secondary Dressing Applied Bordered Foam Dressing Dry Gauze Notes sorbact Electronic Signature(s) Signed: 09/04/2016 3:03:10 PM By: Christin Fudge MD, FACS Entered By: Christin Fudge on 09/04/2016 15:03:09 [name], [name] ([id number]) -------------------------------------------------------------------------------- [place] Details Patient Name: [name], [name]. Date of Service: 09/04/2016 2:30 PM Medical Record Number: [id number] Patient Account Number: [id number] Date of Birth/Sex: [birth year] (82 y.o. Male) Treating RN: Ahmed Prima Primary Care Kaelin Holford: [name] Other Clinician: Referring Aarish Rockers: [name] Treating Deovion Batrez/Extender: [name] in Treatment: 14 Active Inactive ` Abuse / Safety / Falls / Self Care Management Nursing Diagnoses: Potential for falls Goals: Patient will remain injury free Date Initiated: 05/29/2016 Target Resolution Date: 08/02/2016 Goal Status: Active Interventions: Assess fall risk on admission and as needed Assess impairment of mobility on admission and as needed per policy Notes: ` Nutrition Nursing Diagnoses: Imbalanced nutrition Goals: Patient/caregiver agrees to and verbalizes understanding of need to use nutritional supplements and/or vitamins as prescribed Date Initiated: 05/29/2016 Target Resolution Date: 08/02/2016 Goal Status: Active Interventions: Assess patient nutrition upon admission and as needed per policy Notes: ` Orientation to the Wound Care Program Nursing Diagnoses: [name], [name] ([id number]) Knowledge deficit related to the wound healing center program Goals: Patient/caregiver will verbalize understanding of the  [facility] Date Initiated: 05/29/2016 Target Resolution Date: 06/14/2016 Goal Status: Active Interventions: Provide education on orientation to the wound center Notes: ` Pain, Acute or Chronic Nursing Diagnoses: Pain, acute or chronic: actual or potential Potential alteration in comfort, pain Goals: Patient will verbalize adequate pain control and receive pain control interventions during procedures as needed Date Initiated: 05/29/2016 Target Resolution Date: 08/02/2016 Goal Status: Active Interventions: Complete pain assessment as per visit requirements Notes: ` Wound/Skin Impairment Nursing Diagnoses: Impaired tissue integrity Knowledge deficit related to ulceration/compromised skin integrity Goals: Ulcer/skin breakdown will have a volume reduction of 80% by week 12 Date Initiated: 05/29/2016 Target Resolution Date: 08/02/2016 Goal Status: Active Interventions: Assess patient/caregiver ability to perform ulcer/skin care regimen upon admission and as needed Assess ulceration(s) every visit Notes: [name], [name] ([id number]) Electronic Signature(s) Signed: 09/04/2016 4:23:06 PM By: Alric Quan Entered By: Alric Quan on 09/04/2016 14:43:44 [name] ([id number]) -------------------------------------------------------------------------------- Pain Assessment Details Patient Name: [name]. Date of Service: 09/04/2016 2:30 PM Medical Record Number: [id number] Patient Account Number: [id number] Date of Birth/Sex: Oct 14, 1934 (82 y.o. Male) Treating RN: Carolyne Fiscal,  Debi Primary Care Talaya Lamprecht: [name] Other Clinician: Referring Lianette Broussard: [name] Treating Ali Mohl/Extender: [name] in Treatment: 14 Active Problems Location of Pain Severity and Description of Pain Patient Has Paino No Site Locations With Dressing Change: No Pain Management and Medication Current Pain Management: Electronic  Signature(s) Signed: 09/04/2016 4:23:06 PM By: Alric Quan Entered By: Alric Quan on 09/04/2016 14:36:08 [name] ([id number]) -------------------------------------------------------------------------------- Patient/Caregiver Education Details Patient Name: [name], [name]. Date of Service: 09/04/2016 2:30 PM Medical Record Number: [id number] Patient Account Number: [id number] Date of Birth/Gender: [birth year] (82 y.o. Male) Treating RN: Ahmed Prima Primary Care Physician: [name] Other Clinician: Referring Physician: [name] Treating Physician/Extender: [name] in Treatment: 14 Education Assessment Education Provided To: Patient Education Topics Provided Wound/Skin Impairment: Handouts: Other: change dressing as ordered Methods: Demonstration, Explain/Verbal Responses: State content correctly Electronic Signature(s) Signed: 09/04/2016 4:23:06 PM By: Alric Quan Entered By: Alric Quan on 09/04/2016 14:50:23 [name] ([id number]) -------------------------------------------------------------------------------- Wound Assessment Details Patient Name: REYNALD, WOODS. Date of Service: 09/04/2016 2:30 PM Medical Record Number: [id number] Patient Account Number: [id number] Date of Birth/Sex: [birth year] (82 y.o. Male) Treating RN: Ahmed Prima Primary Care Tammra Pressman: [name] Other Clinician: Referring Valeska Haislip: [name] Treating Kaiel Weide/Extender: [name] in Treatment: 14 Wound Status Wound Number: 1 Primary Pressure Ulcer Etiology: Wound Location: Left Malleolus - Lateral Wound Open Wounding Event: Pressure Injury Status: Date Acquired: 05/01/2016 Comorbid Cataracts, Anemia, Sleep Apnea, Weeks Of Treatment: 14 History: Arrhythmia, Hypertension, Dementia Clustered Wound: No Photos Photo Uploaded By: Alric Quan on 09/05/2016 07:55:14 Wound Measurements Length: (cm)  0.2 Width: (cm) 0.1 Depth: (cm) 0.1 Area: (cm) 0.016 Volume: (cm) 0.002 % Reduction in Area: 91.8% % Reduction in Volume: 90% Epithelialization: None Tunneling: No Undermining: No Wound Description Classification: Category/Stage II Foul Odor Aft Wound Margin: Distinct, outline attached Slough/Fibrin Exudate Amount: Large Exudate Type: Serous Exudate Color: amber er Cleansing: No o No Wound Bed Granulation Amount: Medium (34-66%) Exposed Structure Granulation Quality: Pink Fat Layer (Subcutaneous Tissue) Exposed: Yes Necrotic Amount: Medium (34-66%) Necrotic Quality: Eschar, [address] [place]. ([id number]) Periwound Skin Texture Texture Color No Abnormalities Noted: No No Abnormalities Noted: No Erythema: Yes Moisture Erythema Location: Circumferential No Abnormalities Noted: No Temperature / Pain Temperature: No Abnormality Tenderness on Palpation: Yes Wound Preparation Ulcer Cleansing: Rinsed/Irrigated with Saline Topical Anesthetic Applied: Other: lidocaine 4%, Treatment Notes Wound #1 (Left, Lateral Malleolus) 1. Cleansed with: Clean wound with Normal Saline 2. Anesthetic Topical Lidocaine 4% cream to wound bed prior to debridement 3. Peri-wound Care: Skin Prep 4. Dressing Applied: Hydrogel Other dressing (specify in notes) 5. Secondary Dressing Applied Bordered Foam Dressing Dry Gauze Notes sorbact Electronic Signature(s) Signed: 09/04/2016 4:23:06 PM By: Alric Quan Entered By: Alric Quan on 09/04/2016 14:42:48 [name] ([id number]) -------------------------------------------------------------------------------- [place] Details Patient Name: MELINDA, POTTINGER. Date of Service: 09/04/2016 2:30 PM Medical Record Number: [id number] Patient Account Number: [id number] Date of Birth/Sex: 08-02-1934 (82 y.o. Male) Treating RN: Ahmed Prima Primary Care Daysi Boggan: [name] Other Clinician: Referring Shyniece Scripter:  [name] Treating Abdelaziz Westenberger/Extender: [name] in Treatment: 14 Vital Signs Time Taken: 14:36 Pulse (bpm): 59 Height (in): 69 Respiratory Rate (breaths/min): 18 Weight (lbs): 174.8 Blood Pressure (mmHg): 129/64 Body Mass Index (BMI): 25.8 Reference Range: 80 - 120 mg / dl Electronic Signature(s) Signed: 09/04/2016 4:23:06 PM By: Alric Quan Entered By: Alric Quan on 09/04/2016 14:38:15

## 2016-09-08 DIAGNOSIS — I10 Essential (primary) hypertension: Secondary | ICD-10-CM | POA: Diagnosis not present

## 2016-09-08 DIAGNOSIS — M461 Sacroiliitis, not elsewhere classified: Secondary | ICD-10-CM | POA: Diagnosis not present

## 2016-09-08 DIAGNOSIS — M9904 Segmental and somatic dysfunction of sacral region: Secondary | ICD-10-CM | POA: Diagnosis not present

## 2016-09-08 DIAGNOSIS — M545 Low back pain: Secondary | ICD-10-CM | POA: Diagnosis not present

## 2016-09-08 DIAGNOSIS — M9903 Segmental and somatic dysfunction of lumbar region: Secondary | ICD-10-CM | POA: Diagnosis not present

## 2016-09-08 DIAGNOSIS — I482 Chronic atrial fibrillation: Secondary | ICD-10-CM | POA: Diagnosis not present

## 2016-09-11 ENCOUNTER — Encounter: Payer: PPO | Admitting: Surgery

## 2016-09-11 DIAGNOSIS — M545 Low back pain: Secondary | ICD-10-CM | POA: Diagnosis not present

## 2016-09-11 DIAGNOSIS — L89523 Pressure ulcer of left ankle, stage 3: Secondary | ICD-10-CM | POA: Diagnosis not present

## 2016-09-11 DIAGNOSIS — M461 Sacroiliitis, not elsewhere classified: Secondary | ICD-10-CM | POA: Diagnosis not present

## 2016-09-11 DIAGNOSIS — L89522 Pressure ulcer of left ankle, stage 2: Secondary | ICD-10-CM | POA: Diagnosis not present

## 2016-09-11 DIAGNOSIS — M9903 Segmental and somatic dysfunction of lumbar region: Secondary | ICD-10-CM | POA: Diagnosis not present

## 2016-09-11 DIAGNOSIS — M9904 Segmental and somatic dysfunction of sacral region: Secondary | ICD-10-CM | POA: Diagnosis not present

## 2016-09-13 NOTE — Progress Notes (Addendum)
QUANTEL, MCINTURFF ([id number]) Visit Report for 09/11/2016 Arrival Information Details Patient Name: [name], [name]. Date of Service: 09/11/2016 2:30 PM Medical Record Number: [id number] Patient Account Number: [id number] Date of Birth/Sex: 21-Jun-1934 (82 y.o. Male) Treating RN: Ahmed Prima Primary Care Christiane Sistare: [name] Other Clinician: Referring Jamear Carbonneau: [name] Treating Jaeliana Lococo/Extender: [name] in Treatment: 15 Visit Information History Since Last Visit All ordered tests and consults were completed: No Patient Arrived: [name] Added or deleted any medications: No Arrival Time: 14:29 Any new allergies or adverse reactions: No Accompanied By: wife Had a fall or experienced change in No Transfer Assistance: EasyPivot activities of daily living that may affect Patient Lift risk of falls: Patient Identification Verified: Yes Signs or symptoms of abuse/neglect since last No Secondary Verification Process Yes visito Completed: Hospitalized since last visit: No Patient Requires Transmission- No Has Dressing in Place as Prescribed: Yes Based Precautions: Pain Present Now: No Patient Has Alerts: Yes Patient Alerts: ASA Electronic Signature(s) Signed: 09/11/2016 2:56:18 PM By: Alric Quan Entered By: Alric Quan on 09/11/2016 14:29:27 [name] ([id number]) -------------------------------------------------------------------------------- Clinic Level of Care Assessment Details Patient Name: [name]. Date of Service: 09/11/2016 2:30 PM Medical Record Number: [id number] Patient Account Number: [id number] Date of Birth/Sex: 1935/03/11 (82 y.o. Male) Treating RN: Ahmed Prima Primary Care Hendrixx Severin: [name] Other Clinician: Referring Skanda Worlds: [name] Treating Genevieve Ritzel/Extender: [name] in Treatment: 15 Clinic Level of Care Assessment Items TOOL 4 Quantity Score X - Use when only an EandM is  performed on FOLLOW-UP visit 1 0 ASSESSMENTS - Nursing Assessment / Reassessment X - Reassessment of Co-morbidities (includes updates in patient status) 1 10 X - Reassessment of Adherence to Treatment Plan 1 5 ASSESSMENTS - Wound and Skin Assessment / Reassessment X - Simple Wound Assessment / Reassessment - one wound 1 5 []  - Complex Wound Assessment / Reassessment - multiple wounds 0 []  - Dermatologic / Skin Assessment (not related to wound area) 0 ASSESSMENTS - Focused Assessment []  - Circumferential Edema Measurements - multi extremities 0 []  - Nutritional Assessment / Counseling / Intervention 0 []  - Lower Extremity Assessment (monofilament, tuning fork, pulses) 0 []  - Peripheral Arterial Disease Assessment (using hand held doppler) 0 ASSESSMENTS - Ostomy and/or Continence Assessment and Care []  - Incontinence Assessment and Management 0 []  - Ostomy Care Assessment and Management (repouching, etc.) 0 PROCESS - Coordination of Care X - Simple Patient / Family Education for ongoing care 1 15 []  - Complex (extensive) Patient / Family Education for ongoing care 0 []  - Staff obtains Programmer, systems, Records, Test Results / Process Orders 0 []  - Staff telephones HHA, Nursing Homes / Clarify orders / etc 0 []  - Routine Transfer to another Facility (non-emergent condition) 0 BIANCA, VESTER. ([id number]) []  - Routine Hospital Admission (non-emergent condition) 0 []  - New Admissions / Biomedical engineer / Ordering NPWT, Apligraf, etc. 0 []  - Emergency Hospital Admission (emergent condition) 0 X - Simple Discharge Coordination 1 10 []  - Complex (extensive) Discharge Coordination 0 PROCESS - Special Needs []  - Pediatric / Minor Patient Management 0 []  - Isolation Patient Management 0 []  - Hearing / Language / Visual special needs 0 []  - Assessment of Community assistance (transportation, D/C planning, etc.) 0 []  - Additional assistance / Altered mentation 0 []  - Support Surface(s)  Assessment (bed, cushion, seat, etc.) 0 INTERVENTIONS - Wound Cleansing / Measurement X - Simple Wound Cleansing - one wound 1 5 []  - Complex Wound Cleansing -  multiple wounds 0 X - Wound Imaging (photographs - any number of wounds) 1 5 []  - Wound Tracing (instead of photographs) 0 X - Simple Wound Measurement - one wound 1 5 []  - Complex Wound Measurement - multiple wounds 0 INTERVENTIONS - Wound Dressings X - Small Wound Dressing one or multiple wounds 1 10 []  - Medium Wound Dressing one or multiple wounds 0 []  - Large Wound Dressing one or multiple wounds 0 X - Application of Medications - topical 1 5 []  - Application of Medications - injection 0 INTERVENTIONS - Miscellaneous []  - External ear exam 0 ABDULAH, IQBAL. ([id number]) []  - Specimen Collection (cultures, biopsies, blood, body fluids, etc.) 0 []  - Specimen(s) / Culture(s) sent or taken to Lab for analysis 0 []  - Patient Transfer (multiple staff / [name] / Similar devices) 0 []  - Simple Staple / Suture removal (25 or less) 0 []  - Complex Staple / Suture removal (26 or more) 0 []  - Hypo / Hyperglycemic Management (close monitor of Blood Glucose) 0 []  - Ankle / Brachial Index (ABI) - do not check if billed separately 0 X - Vital Signs 1 5 Has the patient been seen at the hospital within the last three years: Yes Total Score: 80 Level Of Care: New/Established - Level 3 Electronic Signature(s) Signed: 09/12/2016 4:07:26 PM By: Alric Quan Entered By: Alric Quan on 09/12/2016 07:57:32 [name] ([id number]) -------------------------------------------------------------------------------- Encounter Discharge Information Details Patient Name: GREGOIRE, BENNIS. Date of Service: 09/11/2016 2:30 PM Medical Record Number: [id number] Patient Account Number: [id number] Date of Birth/Sex: [birth year] (82 y.o. Male) Treating RN: Ahmed Prima Primary Care Kaeya Schiffer: [name] Other Clinician: Referring  Sandhya Denherder: [name] Treating Ora Mcnatt/Extender: [name] in Treatment: 15 Encounter Discharge Information Items Discharge Pain Level: 0 Discharge Condition: Stable Ambulatory Status: Cane Discharge Destination: Home Transportation: Private Auto Accompanied By: wife Schedule Follow-up Appointment: Yes Medication Reconciliation completed No and provided to Patient/Care Zhion Pevehouse: Provided on Clinical Summary of Care: 09/11/2016 Form Type Recipient Paper Patient ET Electronic Signature(s) Signed: 09/11/2016 2:55:41 PM By: Ruthine Dose Entered By: Ruthine Dose on 09/11/2016 14:55:41 [name] ([id number]) -------------------------------------------------------------------------------- Lower Extremity Assessment Details Patient Name: [name]. Date of Service: 09/11/2016 2:30 PM Medical Record Number: [id number] Patient Account Number: [id number] Date of Birth/Sex: May 04, 1935 (82 y.o. Male) Treating RN: Ahmed Prima Primary Care Jencarlo Bonadonna: [name] Other Clinician: Referring Romario Tith: [name] Treating Laterra Lubinski/Extender: [name] in Treatment: 15 Vascular Assessment Pulses: Dorsalis Pedis Palpable: [Left:Yes] Posterior Tibial Extremity colors, hair growth, and conditions: Extremity Color: [Left:Normal] Temperature of Extremity: [Left:Warm] Capillary Refill: [Left:< 3 seconds] Toe Nail Assessment Left: Right: Thick: No Discolored: No Deformed: No Improper Length and Hygiene: No Electronic Signature(s) Signed: 09/11/2016 2:56:18 PM By: Alric Quan Entered By: Alric Quan on 09/11/2016 14:39:28 Forgette, [name] ([id number]) -------------------------------------------------------------------------------- Multi Wound Chart Details Patient Name: [name]. Date of Service: 09/11/2016 2:30 PM Medical Record Number: [id number] Patient Account Number: [id number] Date of Birth/Sex: 15-Feb-1935 (82 y.o.  Male) Treating RN: Ahmed Prima Primary Care Jaryan Chicoine: [name] Other Clinician: Referring Dayla Gasca: [name] Treating Mahnoor Mathisen/Extender: [name] in Treatment: 15 Vital Signs Height(in): 69 Pulse(bpm): 56 Weight(lbs): 174.8 Blood Pressure 122/63 (mmHg): Body Mass Index(BMI): 26 Temperature(F): 97.5 Respiratory Rate 18 (breaths/min): Photos: [N/A:N/A] Wound Location: Left Malleolus - Lateral N/A N/A Wounding Event: Pressure Injury N/A N/A Primary Etiology: Pressure Ulcer N/A N/A Comorbid History: Cataracts, Anemia, Sleep N/A N/A Apnea, Arrhythmia, Hypertension, Dementia  Date Acquired: 05/01/2016 N/A N/A Weeks of Treatment: 15 N/A N/A Wound Status: Open N/A N/A Measurements L x W x D 0.2x0.1x0.1 N/A N/A (cm) Area (cm) : 0.016 N/A N/A Volume (cm) : 0.002 N/A N/A % Reduction in Area: 91.80% N/A N/A % Reduction in Volume: 90.00% N/A N/A Classification: Category/Stage II N/A N/A Exudate Amount: Large N/A N/A Exudate Type: Serous N/A N/A Exudate Color: amber N/A N/A Wound Margin: Distinct, outline attached N/A N/A Granulation Amount: Medium (34-66%) N/A N/A Granulation Quality: Pink N/A N/A Necrotic Amount: Medium (34-66%) N/A N/A JAVIS, ABBOUD ([id number]) Necrotic Tissue: Eschar, Adherent Slough N/A N/A Exposed Structures: Fat Layer (Subcutaneous N/A N/A Tissue) Exposed: Yes Epithelialization: None N/A N/A Periwound Skin Texture: No Abnormalities Noted N/A N/A Periwound Skin No Abnormalities Noted N/A N/A Moisture: Periwound Skin Color: Erythema: Yes N/A N/A Erythema Location: Circumferential N/A N/A Temperature: No Abnormality N/A N/A Tenderness on Yes N/A N/A Palpation: Wound Preparation: Ulcer Cleansing: N/A N/A Rinsed/Irrigated with Saline Topical Anesthetic Applied: Other: lidocaine 4% Treatment Notes Wound #1 (Left, Lateral Malleolus) 1. Cleansed with: Clean wound with Normal Saline 2. Anesthetic Topical  Lidocaine 4% cream to wound bed prior to debridement 3. Peri-wound Care: Skin Prep 4. Dressing Applied: Hydrogel Other dressing (specify in notes) 5. Secondary Dressing Applied Bordered Foam Dressing Dry Gauze Notes sorbact Electronic Signature(s) Signed: 09/11/2016 3:14:00 PM By: Christin Fudge MD, FACS Previous Signature: 09/11/2016 2:56:18 PM Version By: Alric Quan Entered By: Christin Fudge on 09/11/2016 15:14:00 [name] ([id number]) -------------------------------------------------------------------------------- [place] Details Patient Name: ABDIEL, BLACKERBY. Date of Service: 09/11/2016 2:30 PM Medical Record Number: [id number] Patient Account Number: [id number] Date of Birth/Sex: [birth year] (82 y.o. Male) Treating RN: Ahmed Prima Primary Care Maanya Hippert: [name] Other Clinician: Referring Ken Bonn: [name] Treating Dannya Pitkin/Extender: [name] in Treatment: 15 Active Inactive ` Abuse / Safety / Falls / Self Care Management Nursing Diagnoses: Potential for falls Goals: Patient will remain injury free Date Initiated: 05/29/2016 Target Resolution Date: 08/02/2016 Goal Status: Active Interventions: Assess fall risk on admission and as needed Assess impairment of mobility on admission and as needed per policy Notes: ` Nutrition Nursing Diagnoses: Imbalanced nutrition Goals: Patient/caregiver agrees to and verbalizes understanding of need to use nutritional supplements and/or vitamins as prescribed Date Initiated: 05/29/2016 Target Resolution Date: 08/02/2016 Goal Status: Active Interventions: Assess patient nutrition upon admission and as needed per policy Notes: ` Orientation to the Wound Care Program Nursing Diagnoses: KAVAN, DEVAN ([id number]) Knowledge deficit related to the wound healing center program Goals: Patient/caregiver will verbalize understanding of the [facility]  Program Date Initiated: 05/29/2016 Target Resolution Date: 06/14/2016 Goal Status: Active Interventions: Provide education on orientation to the wound center Notes: ` Pain, Acute or Chronic Nursing Diagnoses: Pain, acute or chronic: actual or potential Potential alteration in comfort, pain Goals: Patient will verbalize adequate pain control and receive pain control interventions during procedures as needed Date Initiated: 05/29/2016 Target Resolution Date: 08/02/2016 Goal Status: Active Interventions: Complete pain assessment as per visit requirements Notes: ` Wound/Skin Impairment Nursing Diagnoses: Impaired tissue integrity Knowledge deficit related to ulceration/compromised skin integrity Goals: Ulcer/skin breakdown will have a volume reduction of 80% by week 12 Date Initiated: 05/29/2016 Target Resolution Date: 08/02/2016 Goal Status: Active Interventions: Assess patient/caregiver ability to perform ulcer/skin care regimen upon admission and as needed Assess ulceration(s) every visit Notes: VADIM, CENTOLA ([id number]) Electronic Signature(s) Signed: 09/11/2016 2:56:18 PM By: Alric Quan Entered By: Alric Quan on 09/11/2016 14:39:38 Hooton,  [name] ([id number]) -------------------------------------------------------------------------------- Pain Assessment Details Patient Name: DANIS, PEMBLETON. Date of Service: 09/11/2016 2:30 PM Medical Record Number: [id number] Patient Account Number: [id number] Date of Birth/Sex: [birth year] (82 y.o. Male) Treating RN: Ahmed Prima Primary Care Jennise Both: [name] Other Clinician: Referring Demone Lyles: [name] Treating Nichael Ehly/Extender: [name] in Treatment: 15 Active Problems Location of Pain Severity and Description of Pain Patient Has Paino No Site Locations With Dressing Change: No Pain Management and Medication Current Pain Management: Electronic Signature(s) Signed: 09/11/2016  2:56:18 PM By: Alric Quan Entered By: Alric Quan on 09/11/2016 14:30:55 [name] ([id number]) -------------------------------------------------------------------------------- Patient/Caregiver Education Details Patient Name: VERTIS, SCHEIB. Date of Service: 09/11/2016 2:30 PM Medical Record Number: [id number] Patient Account Number: [id number] Date of Birth/Gender: [birth year] (82 y.o. Male) Treating RN: Ahmed Prima Primary Care Physician: [name] Other Clinician: Referring Physician: [name] Treating Physician/Extender: [name] in Treatment: 15 Education Assessment Education Provided To: Patient Education Topics Provided Wound/Skin Impairment: Handouts: Other: change dressing as ordered Methods: Demonstration, Explain/Verbal Responses: State content correctly Electronic Signature(s) Signed: 09/11/2016 2:56:18 PM By: Alric Quan Entered By: Alric Quan on 09/11/2016 14:40:23 [name] ([id number]) -------------------------------------------------------------------------------- Wound Assessment Details Patient Name: SIMRAN, MANNIS. Date of Service: 09/11/2016 2:30 PM Medical Record Number: [id number] Patient Account Number: [id number] Date of Birth/Sex: [birth year] (82 y.o. Male) Treating RN: Ahmed Prima Primary Care Kasmira Cacioppo: [name] Other Clinician: Referring Jovanny Stephanie: [name] Treating Hercules Hasler/Extender: [name] in Treatment: 15 Wound Status Wound Number: 1 Primary Pressure Ulcer Etiology: Wound Location: Left Malleolus - Lateral Wound Open Wounding Event: Pressure Injury Status: Date Acquired: 05/01/2016 Comorbid Cataracts, Anemia, Sleep Apnea, Weeks Of Treatment: 15 History: Arrhythmia, Hypertension, Dementia Clustered Wound: No Photos Photo Uploaded By: Alric Quan on 09/11/2016 14:41:43 Wound Measurements Length: (cm) 0.2 Width: (cm) 0.1 Depth: (cm)  0.1 Area: (cm) 0.016 Volume: (cm) 0.002 % Reduction in Area: 91.8% % Reduction in Volume: 90% Epithelialization: None Tunneling: No Undermining: No Wound Description Classification: Category/Stage II Foul Odor Aft Wound Margin: Distinct, outline attached Slough/Fibrin Exudate Amount: Large Exudate Type: Serous Exudate Color: amber er Cleansing: No o No Wound Bed Granulation Amount: Medium (34-66%) Exposed Structure Granulation Quality: Pink Fat Layer (Subcutaneous Tissue) Exposed: Yes Necrotic Amount: Medium (34-66%) Necrotic Quality: Eschar, [address], [place]. ([id number]) Periwound Skin Texture Texture Color No Abnormalities Noted: No No Abnormalities Noted: No Erythema: Yes Moisture Erythema Location: Circumferential No Abnormalities Noted: No Temperature / Pain Temperature: No Abnormality Tenderness on Palpation: Yes Wound Preparation Ulcer Cleansing: Rinsed/Irrigated with Saline Topical Anesthetic Applied: Other: lidocaine 4%, Treatment Notes Wound #1 (Left, Lateral Malleolus) 1. Cleansed with: Clean wound with Normal Saline 2. Anesthetic Topical Lidocaine 4% cream to wound bed prior to debridement 3. Peri-wound Care: Skin Prep 4. Dressing Applied: Hydrogel Other dressing (specify in notes) 5. Secondary Dressing Applied Bordered Foam Dressing Dry Gauze Notes sorbact Electronic Signature(s) Signed: 09/11/2016 2:56:18 PM By: Alric Quan Entered By: Alric Quan on 09/11/2016 14:39:02 [name] ([id number]) -------------------------------------------------------------------------------- Vitals Details Patient Name: DAVARI, LOPES. Date of Service: 09/11/2016 2:30 PM Medical Record Number: [id number] Patient Account Number: [id number] Date of Birth/Sex: 19-Jun-1934 (82 y.o. Male) Treating RN: Ahmed Prima Primary Care Saraiah Bhat: [name] Other Clinician: Referring Naly Schwanz: [name] Treating  Izzac Rockett/Extender: [name] in Treatment: 15 Vital Signs Time Taken: 14:30 Temperature (F): 97.5 Height (in): 69 Pulse (bpm): 56 Weight (lbs): 174.8 Respiratory Rate (breaths/min): 18 Body Mass Index (BMI):  25.8 Blood Pressure (mmHg): 122/63 Reference Range: 80 - 120 mg / dl Electronic Signature(s) Signed: 09/11/2016 2:56:18 PM By: Alric Quan Entered By: Alric Quan on 09/11/2016 14:31:25

## 2016-09-13 NOTE — Progress Notes (Signed)
[name], [name] ([id number]) Visit Report for 09/11/2016 Chief Complaint Document Details Patient Name: [name], [name]. Date of Service: 09/11/2016 2:30 PM Medical Record Number: [id number] Patient Account Number: [id number] Date of Birth/Sex: [birth year] (82 y.o. Male) Treating RN: Ahmed Prima Primary Care Provider: [name] Other Clinician: Referring Provider: [name] Treating Provider/Extender: [name] in Treatment: 15 Information Obtained from: Patient Chief Complaint Patient is at the clinic for treatment of an open pressure ulcer to the left ankle which she's had for about 5 weeks Electronic Signature(s) Signed: 09/11/2016 3:14:07 PM By: Christin Fudge MD, FACS Entered By: Christin Fudge on 09/11/2016 15:14:07 [name] ([id number]) -------------------------------------------------------------------------------- HPI Details Patient Name: [name], [name]. Date of Service: 09/11/2016 2:30 PM Medical Record Number: [id number] Patient Account Number: [id number] Date of Birth/Sex: 1935-04-16 (82 y.o. Male) Treating RN: Ahmed Prima Primary Care Provider: [name] Other Clinician: Referring Provider: [name] Treating Provider/Extender: [name] in Treatment: 15 History of Present Illness Location: left ankle laterally Quality: Patient reports experiencing a dull pain to affected area(s). Severity: Patient states wound are getting worse. Duration: Patient has had the wound for < 5 weeks prior to presenting for treatment Timing: Pain in wound is Intermittent (comes and goes Context: The wound appeared gradually over time Modifying Factors: Other treatment(s) tried include:put on Bactroban and doxycycline Associated Signs and Symptoms: Patient reports having difficulty standing for long periods. HPI Description: 81 year old gentleman who is known to have a history of dementia but is fairly functional and is able to  ambulate has been having a pressure ulcer to the left ankle due to laying in bed for longer than he should. Past history of atrial fibrillation, dementia, hyperlipidemia, hypertension. He's also had some orthopedic related surgeries in the past and was a former smoker and is given up in 1969. Recently seen by his PCP who put him on doxycycline and asked him to apply Bactroban and see [place] at the wound clinic. 06/05/2016 -- had a x-ray of the left ankle which showed no acute bony pathology. Electronic Signature(s) Signed: 09/11/2016 3:14:14 PM By: Christin Fudge MD, FACS Entered By: Christin Fudge on 09/11/2016 15:14:13 [name] ([id number]) -------------------------------------------------------------------------------- Physical Exam Details Patient Name: [name], [name]. Date of Service: 09/11/2016 2:30 PM Medical Record Number: [id number] Patient Account Number: [id number] Date of Birth/Sex: April 20, 1935 (82 y.o. Male) Treating RN: Ahmed Prima Primary Care Provider: [name] Other Clinician: Referring Provider: [name] Treating Provider/Extender: [name] in Treatment: 15 Constitutional . Pulse regular. Respirations normal and unlabored. Afebrile. . Eyes Nonicteric. Reactive to light. Ears, Nose, Mouth, and Throat Lips, teeth, and gums WNL.Marland Kitchen Moist mucosa without lesions. Neck supple and nontender. No palpable supraclavicular or cervical adenopathy. Normal sized without goiter. Respiratory WNL. No retractions.. Cardiovascular Pedal Pulses WNL. No clubbing, cyanosis or edema. Chest Breasts symmetical and no nipple discharge.. Breast tissue WNL, no masses, lumps, or tenderness.. Lymphatic No adneopathy. No adenopathy. No adenopathy. Musculoskeletal Adexa without tenderness or enlargement.. Digits and nails w/o clubbing, cyanosis, infection, petechiae, ischemia, or inflammatory conditions.. Integumentary (Hair, Skin) No suspicious lesions. No  crepitus or fluctuance. No peri-wound warmth or erythema. No masses.Marland Kitchen Psychiatric Judgement and insight Intact.. No evidence of depression, anxiety, or agitation.. Notes the wound is looking very good is shallow and the ulceration is just a few millimeters Electronic Signature(s) Signed: 09/11/2016 3:14:37 PM By: Christin Fudge MD, FACS Entered By: Christin Fudge on 09/11/2016 15:14:36 [name] ([id number]) -------------------------------------------------------------------------------- Physician  Orders Details Patient Name: [name], [name]. Date of Service: 09/11/2016 2:30 PM Medical Record Number: [id number] Patient Account Number: [id number] Date of Birth/Sex: 09-Oct-1934 (82 y.o. Male) Treating RN: Ahmed Prima Primary Care Provider: [name] Other Clinician: Referring Provider: [name] Treating Provider/Extender: [name] in Treatment: 15 Verbal / Phone Orders: Yes Clinician: Carolyne Fiscal, Debi Read Back and Verified: Yes Diagnosis Coding Wound Cleansing Wound #1 Left,Lateral Malleolus o Clean wound with Normal Saline. o May Shower, gently pat wound dry prior to applying new dressing. Anesthetic Wound #1 Left,Lateral Malleolus o Topical Lidocaine 4% cream applied to wound bed prior to debridement - for clinic use Skin Barriers/Peri-Wound Care Wound #1 Left,Lateral Malleolus o Skin Prep Primary Wound Dressing Wound #1 Left,Lateral Malleolus o Hydrogel o Other: - sorbact Secondary Dressing Wound #1 Left,Lateral Malleolus o Dry Gauze o Boardered Foam Dressing Follow-up Appointments Wound #1 Left,Lateral Malleolus o Return Appointment in 1 week. Edema Control Wound #1 Left,Lateral Malleolus o Elevate legs to the level of the heart and pump ankles as often as possible Off-Loading Wound #1 Left,Lateral Malleolus o Turn and reposition every 2 hours o Other: - keep pressure off of affected area [name], [name].  ([id number]) Additional Orders / Instructions Wound #1 Left,Lateral Malleolus o Increase protein intake. Medications-please add to medication list. Wound #1 Left,Lateral Malleolus o Other: - Vitamin A, Vitamin C, Zinc, MVI Electronic Signature(s) Signed: 09/11/2016 2:56:18 PM By: Alric Quan Signed: 09/11/2016 4:13:28 PM By: Christin Fudge MD, FACS Entered By: Alric Quan on 09/11/2016 14:51:33 [name] ([id number]) -------------------------------------------------------------------------------- Problem List Details Patient Name: [name], [name]. Date of Service: 09/11/2016 2:30 PM Medical Record Number: [id number] Patient Account Number: [id number] Date of Birth/Sex: Apr 06, 1935 (82 y.o. Male) Treating RN: Ahmed Prima Primary Care Provider: [name] Other Clinician: Referring Provider: [name] Treating Provider/Extender: [name] in Treatment: 15 Active Problems ICD-10 Encounter Code Description Active Date Diagnosis [phone] Pressure ulcer of left ankle, stage 2 05/29/2016 Yes F02.80 Dementia in other diseases classified elsewhere without 05/29/2016 Yes behavioral disturbance Inactive Problems Resolved Problems Electronic Signature(s) Signed: 09/11/2016 3:13:54 PM By: Christin Fudge MD, FACS Entered By: Christin Fudge on 09/11/2016 15:13:53 [name] ([id number]) -------------------------------------------------------------------------------- Progress Note Details Patient Name: [name], [name]. Date of Service: 09/11/2016 2:30 PM Medical Record Number: [id number] Patient Account Number: [id number] Date of Birth/Sex: 1934-06-19 (82 y.o. Male) Treating RN: Ahmed Prima Primary Care Provider: [name] Other Clinician: Referring Provider: [name] Treating Provider/Extender: [name] in Treatment: 15 Subjective Chief Complaint Information obtained from Patient Patient is at the clinic for  treatment of an open pressure ulcer to the left ankle which she's had for about 5 weeks History of Present Illness (HPI) The following HPI elements were documented for the patient's wound: Location: left ankle laterally Quality: Patient reports experiencing a dull pain to affected area(s). Severity: Patient states wound are getting worse. Duration: Patient has had the wound for < 5 weeks prior to presenting for treatment Timing: Pain in wound is Intermittent (comes and goes Context: The wound appeared gradually over time Modifying Factors: Other treatment(s) tried include:put on Bactroban and doxycycline Associated Signs and Symptoms: Patient reports having difficulty standing for long periods. 82 year old gentleman who is known to have a history of dementia but is fairly functional and is able to ambulate has been having a pressure ulcer to the left ankle due to laying in bed for longer than he should. Past history of atrial fibrillation,  dementia, hyperlipidemia, hypertension. He's also had some orthopedic related surgeries in the past and was a former smoker and is given up in 1969. Recently seen by his PCP who put him on doxycycline and asked him to apply Bactroban and see [place] at the wound clinic. 06/05/2016 -- had a x-ray of the left ankle which showed no acute bony pathology. Objective Constitutional Pulse regular. Respirations normal and unlabored. Afebrile. Vitals Time Taken: 2:30 PM, Height: 69 in, Weight: 174.8 lbs, BMI: 25.8, Temperature: 97.5 F, Pulse: 56 bpm, Respiratory Rate: 18 breaths/min, Blood Pressure: 122/63 mmHg. [name], [name]. ([id number]) Eyes Nonicteric. Reactive to light. Ears, Nose, Mouth, and Throat Lips, teeth, and gums WNL.Marland Kitchen Moist mucosa without lesions. Neck supple and nontender. No palpable supraclavicular or cervical adenopathy. Normal sized without goiter. Respiratory WNL. No retractions.. Cardiovascular Pedal Pulses WNL. No clubbing, cyanosis or  edema. Chest Breasts symmetical and no nipple discharge.. Breast tissue WNL, no masses, lumps, or tenderness.. Lymphatic No adneopathy. No adenopathy. No adenopathy. Musculoskeletal Adexa without tenderness or enlargement.. Digits and nails w/o clubbing, cyanosis, infection, petechiae, ischemia, or inflammatory conditions.Marland Kitchen Psychiatric Judgement and insight Intact.. No evidence of depression, anxiety, or agitation.. General Notes: the wound is looking very good is shallow and the ulceration is just a few millimeters Integumentary (Hair, Skin) No suspicious lesions. No crepitus or fluctuance. No peri-wound warmth or erythema. No masses.. Wound #1 status is Open. Original cause of wound was Pressure Injury. The wound is located on the Left,Lateral Malleolus. The wound measures 0.2cm length x 0.1cm width x 0.1cm depth; 0.016cm^2 area and 0.002cm^3 volume. There is Fat Layer (Subcutaneous Tissue) Exposed exposed. There is no tunneling or undermining noted. There is a large amount of serous drainage noted. The wound margin is distinct with the outline attached to the wound base. There is medium (34-66%) pink granulation within the wound bed. There is a medium (34-66%) amount of necrotic tissue within the wound bed including Eschar and Adherent Slough. The periwound skin appearance exhibited: Erythema. The surrounding wound skin color is noted with erythema which is circumferential. Periwound temperature was noted as No Abnormality. The periwound has tenderness on palpation. Assessment [name], [name] ([id number]) Active Problems ICD-10 [phone] - Pressure ulcer of left ankle, stage 2 F02.80 - Dementia in other diseases classified elsewhere without behavioral disturbance Plan Wound Cleansing: Wound #1 Left,Lateral Malleolus: Clean wound with Normal Saline. May Shower, gently pat wound dry prior to applying new dressing. Anesthetic: Wound #1 Left,Lateral Malleolus: Topical Lidocaine 4%  cream applied to wound bed prior to debridement - for clinic use Skin Barriers/Peri-Wound Care: Wound #1 Left,Lateral Malleolus: Skin Prep Primary Wound Dressing: Wound #1 Left,Lateral Malleolus: Hydrogel Other: - sorbact Secondary Dressing: Wound #1 Left,Lateral Malleolus: Dry Gauze Boardered Foam Dressing Follow-up Appointments: Wound #1 Left,Lateral Malleolus: Return Appointment in 1 week. Edema Control: Wound #1 Left,Lateral Malleolus: Elevate legs to the level of the heart and pump ankles as often as possible Off-Loading: Wound #1 Left,Lateral Malleolus: Turn and reposition every 2 hours Other: - keep pressure off of affected area Additional Orders / Instructions: Wound #1 Left,Lateral Malleolus: Increase protein intake. Medications-please add to medication list.: Wound #1 Left,Lateral Malleolus: Other: - Vitamin A, Vitamin C, Zinc, MVI [name], Jared M. ([id number]) there is a minimal open area over the wound and I have recommended: 1. Sorbact with hydrogel to be placed into the wound and a protective bordered foam. 2. Off-Loading has been discussed in great detail 3. adequate protein, vitamin A, vitamin C and zinc 4.  Regular visits to the wound center. I anticipate discharge soon Electronic Signature(s) Signed: 09/11/2016 3:16:07 PM By: Christin Fudge MD, FACS Entered By: Christin Fudge on 09/11/2016 15:16:07 [name] ([id number]) -------------------------------------------------------------------------------- SuperBill Details Patient Name: [name], [name]. Date of Service: 09/11/2016 Medical Record Number: [id number] Patient Account Number: [id number] Date of Birth/Sex: 28-Nov-1934 (82 y.o. Male) Treating RN: Ahmed Prima Primary Care Provider: [name] Other Clinician: Referring Provider: [name] Treating Provider/Extender: [name] in Treatment: 15 Diagnosis Coding ICD-10 Codes Code Description [phone] Pressure ulcer of  left ankle, stage 2 F02.80 Dementia in other diseases classified elsewhere without behavioral disturbance Facility Procedures CPT4 Code: [id number] Description: 99213 - WOUND CARE VISIT-LEV 3 EST PT Modifier: Quantity: 1 Physician Procedures CPT4: Description Modifier Quantity Code [id number] 99213 - WC PHYS LEVEL 3 - EST PT 1 ICD-10 Description Diagnosis L89.522 Pressure ulcer of left ankle, stage 2 F02.80 Dementia in other diseases classified elsewhere without behavioral disturbance Electronic Signature(s) Signed: 09/12/2016 3:55:17 PM By: Christin Fudge MD, FACS Signed: 09/12/2016 4:07:26 PM By: Alric Quan Previous Signature: 09/11/2016 3:16:45 PM Version By: Christin Fudge MD, FACS Entered By: Alric Quan on 09/12/2016 07:57:41

## 2016-09-15 DIAGNOSIS — Z9989 Dependence on other enabling machines and devices: Secondary | ICD-10-CM | POA: Diagnosis not present

## 2016-09-15 DIAGNOSIS — Z0001 Encounter for general adult medical examination with abnormal findings: Secondary | ICD-10-CM | POA: Diagnosis not present

## 2016-09-15 DIAGNOSIS — I482 Chronic atrial fibrillation: Secondary | ICD-10-CM | POA: Diagnosis not present

## 2016-09-15 DIAGNOSIS — I10 Essential (primary) hypertension: Secondary | ICD-10-CM | POA: Diagnosis not present

## 2016-09-15 DIAGNOSIS — G4733 Obstructive sleep apnea (adult) (pediatric): Secondary | ICD-10-CM | POA: Diagnosis not present

## 2016-09-15 DIAGNOSIS — D693 Immune thrombocytopenic purpura: Secondary | ICD-10-CM | POA: Diagnosis not present

## 2016-09-15 DIAGNOSIS — E782 Mixed hyperlipidemia: Secondary | ICD-10-CM | POA: Diagnosis not present

## 2016-09-15 DIAGNOSIS — F0390 Unspecified dementia without behavioral disturbance: Secondary | ICD-10-CM | POA: Diagnosis not present

## 2016-09-18 ENCOUNTER — Encounter: Payer: PPO | Admitting: Surgery

## 2016-09-18 DIAGNOSIS — L89523 Pressure ulcer of left ankle, stage 3: Secondary | ICD-10-CM | POA: Diagnosis not present

## 2016-09-18 DIAGNOSIS — L89522 Pressure ulcer of left ankle, stage 2: Secondary | ICD-10-CM | POA: Diagnosis not present

## 2016-09-20 NOTE — Progress Notes (Signed)
ECTOR, LAUREL ([id number]) Visit Report for 09/18/2016 Arrival Information Details Patient Name: [name], [name]. Date of Service: 09/18/2016 2:30 PM Medical Record Number: [id number] Patient Account Number: [id number] Date of Birth/Sex: May 30, 1934 (82 y.o. Male) Treating RN: Ahmed Prima Primary Care Gearld Kerstein: [name] Other Clinician: Referring Raymone Pembroke: [name] Treating Alucard Fearnow/Extender: [name] in Treatment: 16 Visit Information History Since Last Visit All ordered tests and consults were completed: No Patient Arrived: [name] Added or deleted any medications: No Arrival Time: 14:41 Any new allergies or adverse reactions: No Accompanied By: wife Had a fall or experienced change in No Transfer Assistance: EasyPivot activities of daily living that may affect Patient Lift risk of falls: Patient Identification Verified: Yes Signs or symptoms of abuse/neglect since last No Secondary Verification Process Yes visito Completed: Hospitalized since last visit: No Patient Requires Transmission- No Has Dressing in Place as Prescribed: Yes Based Precautions: Pain Present Now: No Patient Has Alerts: Yes Patient Alerts: ASA Electronic Signature(s) Signed: 09/18/2016 4:23:04 PM By: Alric Quan Entered By: Alric Quan on 09/18/2016 14:41:56 [name] ([id number]) -------------------------------------------------------------------------------- Clinic Level of Care Assessment Details Patient Name: [name]. Date of Service: 09/18/2016 2:30 PM Medical Record Number: [id number] Patient Account Number: [id number] Date of Birth/Sex: [birth year] (82 y.o. Male) Treating RN: Ahmed Prima Primary Care Leshia Kope: [name] Other Clinician: Referring Kinsley Holderman: [name] Treating Lucrezia Dehne/Extender: [name] in Treatment: 16 Clinic Level of Care Assessment Items TOOL 4 Quantity Score X - Use when only an EandM is  performed on FOLLOW-UP visit 1 0 ASSESSMENTS - Nursing Assessment / Reassessment X - Reassessment of Co-morbidities (includes updates in patient status) 1 10 X - Reassessment of Adherence to Treatment Plan 1 5 ASSESSMENTS - Wound and Skin Assessment / Reassessment X - Simple Wound Assessment / Reassessment - one wound 1 5 []  - Complex Wound Assessment / Reassessment - multiple wounds 0 []  - Dermatologic / Skin Assessment (not related to wound area) 0 ASSESSMENTS - Focused Assessment []  - Circumferential Edema Measurements - multi extremities 0 []  - Nutritional Assessment / Counseling / Intervention 0 []  - Lower Extremity Assessment (monofilament, tuning fork, pulses) 0 []  - Peripheral Arterial Disease Assessment (using hand held doppler) 0 ASSESSMENTS - Ostomy and/or Continence Assessment and Care []  - Incontinence Assessment and Management 0 []  - Ostomy Care Assessment and Management (repouching, etc.) 0 PROCESS - Coordination of Care X - Simple Patient / Family Education for ongoing care 1 15 []  - Complex (extensive) Patient / Family Education for ongoing care 0 []  - Staff obtains Programmer, systems, Records, Test Results / Process Orders 0 []  - Staff telephones HHA, Nursing Homes / Clarify orders / etc 0 []  - Routine Transfer to another Facility (non-emergent condition) 0 MIHIR, FLANIGAN. ([id number]) []  - Routine Hospital Admission (non-emergent condition) 0 []  - New Admissions / Biomedical engineer / Ordering NPWT, Apligraf, etc. 0 []  - Emergency Hospital Admission (emergent condition) 0 X - Simple Discharge Coordination 1 10 []  - Complex (extensive) Discharge Coordination 0 PROCESS - Special Needs []  - Pediatric / Minor Patient Management 0 []  - Isolation Patient Management 0 []  - Hearing / Language / Visual special needs 0 []  - Assessment of Community assistance (transportation, D/C planning, etc.) 0 []  - Additional assistance / Altered mentation 0 []  - Support Surface(s)  Assessment (bed, cushion, seat, etc.) 0 INTERVENTIONS - Wound Cleansing / Measurement X - Simple Wound Cleansing - one wound 1 5 []  - Complex Wound Cleansing -  multiple wounds 0 []  - Wound Imaging (photographs - any number of wounds) 0 []  - Wound Tracing (instead of photographs) 0 []  - Simple Wound Measurement - one wound 0 []  - Complex Wound Measurement - multiple wounds 0 INTERVENTIONS - Wound Dressings X - Small Wound Dressing one or multiple wounds 1 10 []  - Medium Wound Dressing one or multiple wounds 0 []  - Large Wound Dressing one or multiple wounds 0 X - Application of Medications - topical 1 5 []  - Application of Medications - injection 0 INTERVENTIONS - Miscellaneous []  - External ear exam 0 DARROLL, BREDESON. ([id number]) []  - Specimen Collection (cultures, biopsies, blood, body fluids, etc.) 0 []  - Specimen(s) / Culture(s) sent or taken to Lab for analysis 0 []  - Patient Transfer (multiple staff / [name] / Similar devices) 0 []  - Simple Staple / Suture removal (25 or less) 0 []  - Complex Staple / Suture removal (26 or more) 0 []  - Hypo / Hyperglycemic Management (close monitor of Blood Glucose) 0 []  - Ankle / Brachial Index (ABI) - do not check if billed separately 0 X - Vital Signs 1 5 Has the patient been seen at the hospital within the last three years: Yes Total Score: 70 Level Of Care: New/Established - Level 2 Electronic Signature(s) Signed: 09/18/2016 4:23:04 PM By: Alric Quan Entered By: Alric Quan on 09/18/2016 15:51:39 [name] ([id number]) -------------------------------------------------------------------------------- Encounter Discharge Information Details Patient Name: CHAU, SAWIN. Date of Service: 09/18/2016 2:30 PM Medical Record Number: [id number] Patient Account Number: [id number] Date of Birth/Sex: 13-Aug-1934 (82 y.o. Male) Treating RN: Ahmed Prima Primary Care Terryann Verbeek: [name] Other Clinician: Referring  Domonik Levario: [name] Treating Santrice Muzio/Extender: [name] in Treatment: 65 Encounter Discharge Information Items Schedule Follow-up Appointment: No Medication Reconciliation completed No and provided to Patient/Care Domonique Brouillard: Provided on Clinical Summary of Care: 09/18/2016 Form Type Recipient Paper Patient ET Electronic Signature(s) Signed: 09/18/2016 3:02:30 PM By: Ruthine Dose Entered By: Ruthine Dose on 09/18/2016 15:02:30 [name] ([id number]) -------------------------------------------------------------------------------- Lower Extremity Assessment Details Patient Name: ALPHEUS, STIFF. Date of Service: 09/18/2016 2:30 PM Medical Record Number: [id number] Patient Account Number: [id number] Date of Birth/Sex: 1934/07/17 (82 y.o. Male) Treating RN: Ahmed Prima Primary Care Yovanni Frenette: [name] Other Clinician: Referring Lashan Gluth: [name] Treating Krue Peterka/Extender: [name] in Treatment: 16 Vascular Assessment Pulses: Dorsalis Pedis Palpable: [Left:Yes] Posterior Tibial Extremity colors, hair growth, and conditions: Extremity Color: [Left:Normal] Temperature of Extremity: [Left:Warm] Capillary Refill: [Left:< 3 seconds] Toe Nail Assessment Left: Right: Thick: No Discolored: No Deformed: No Improper Length and Hygiene: No Electronic Signature(s) Signed: 09/18/2016 4:23:04 PM By: Alric Quan Entered By: Alric Quan on 09/18/2016 16:04:47 [name] ([id number]) -------------------------------------------------------------------------------- Multi Wound Chart Details Patient Name: [name]. Date of Service: 09/18/2016 2:30 PM Medical Record Number: [id number] Patient Account Number: [id number] Date of Birth/Sex: [birth year] (82 y.o. Male) Treating RN: Ahmed Prima Primary Care Athaliah Baumbach: [name] Other Clinician: Referring Glender Augusta: [name] Treating Jamonta Goerner/Extender:  [name] in Treatment: 66 Photos: [1:No Photos] [N/A:N/A] Wound Location: [1:Left Malleolus - Lateral] [N/A:N/A] Wounding Event: [1:Pressure Injury] [N/A:N/A] Primary Etiology: [1:Pressure Ulcer] [N/A:N/A] Comorbid History: [1:Cataracts, Anemia, Sleep Apnea, Arrhythmia, Hypertension, Dementia] [N/A:N/A] Date Acquired: [1:05/01/2016] [N/A:N/A] Weeks of Treatment: [1:16] [N/A:N/A] Wound Status: [1:Open] [N/A:N/A] Measurements L x W x D 0.2x0.1x0.1 [N/A:N/A] (cm) Area (cm) : [1:0.016] [N/A:N/A] Volume (cm) : [1:0.002] [N/A:N/A] % Reduction in Area: [1:91.80%] [N/A:N/A] % Reduction in Volume: 90.00% [N/A:N/A] Classification: [  1:Category/Stage II] [N/A:N/A] Exudate Amount: [1:Large] [N/A:N/A] Exudate Type: [1:Serous] [N/A:N/A] Exudate Color: [1:amber] [N/A:N/A] Wound Margin: [1:Distinct, outline attached] [N/A:N/A] Granulation Amount: [1:None Present (0%)] [N/A:N/A] Necrotic Amount: [1:Large (67-100%)] [N/A:N/A] Exposed Structures: [1:Fat Layer (Subcutaneous Tissue) Exposed: Yes] [N/A:N/A] Epithelialization: [1:None] [N/A:N/A] Periwound Skin Texture: No Abnormalities Noted [N/A:N/A] Periwound Skin [1:No Abnormalities Noted] [N/A:N/A] Moisture: Periwound Skin Color: Erythema: Yes [N/A:N/A] Erythema Location: [1:Circumferential] [N/A:N/A] Temperature: [1:No Abnormality] [N/A:N/A] Tenderness on [1:Yes] [N/A:N/A] Palpation: Wound Preparation: [1:Ulcer Cleansing: Rinsed/Irrigated with Saline] [N/A:N/A] Topical Anesthetic Applied: Other: lidocaine 4% Treatment Notes Electronic Signature(s) Signed: 09/18/2016 3:31:25 PM By: Christin Fudge MD, FACS Entered By: Christin Fudge on 09/18/2016 15:31:25 [name] ([id number]) -------------------------------------------------------------------------------- Naplate Details Patient Name: JAMAURI, KRUZEL. Date of Service: 09/18/2016 2:30 PM Medical Record Number: [id number] Patient Account Number:  [id number] Date of Birth/Sex: [birth year] (82 y.o. Male) Treating RN: Ahmed Prima Primary Care Clete Kuch: [name] Other Clinician: Referring Jeris Easterly: [name] Treating Posey Petrik/Extender: [name] in Treatment: 16 Active Inactive ` Abuse / Safety / Falls / Self Care Management Nursing Diagnoses: Potential for falls Goals: Patient will remain injury free Date Initiated: 05/29/2016 Target Resolution Date: 08/02/2016 Goal Status: Active Interventions: Assess fall risk on admission and as needed Assess impairment of mobility on admission and as needed per policy Notes: ` Nutrition Nursing Diagnoses: Imbalanced nutrition Goals: Patient/caregiver agrees to and verbalizes understanding of need to use nutritional supplements and/or vitamins as prescribed Date Initiated: 05/29/2016 Target Resolution Date: 08/02/2016 Goal Status: Active Interventions: Assess patient nutrition upon admission and as needed per policy Notes: ` Orientation to the Wound Care Program Nursing Diagnoses: SHAVON, ZENZ ([id number]) Knowledge deficit related to the wound healing center program Goals: Patient/caregiver will verbalize understanding of the [facility] Program Date Initiated: 05/29/2016 Target Resolution Date: 06/14/2016 Goal Status: Active Interventions: Provide education on orientation to the wound center Notes: ` Pain, Acute or Chronic Nursing Diagnoses: Pain, acute or chronic: actual or potential Potential alteration in comfort, pain Goals: Patient will verbalize adequate pain control and receive pain control interventions during procedures as needed Date Initiated: 05/29/2016 Target Resolution Date: 08/02/2016 Goal Status: Active Interventions: Complete pain assessment as per visit requirements Notes: ` Wound/Skin Impairment Nursing Diagnoses: Impaired tissue integrity Knowledge deficit related to ulceration/compromised skin  integrity Goals: Ulcer/skin breakdown will have a volume reduction of 80% by week 12 Date Initiated: 05/29/2016 Target Resolution Date: 08/02/2016 Goal Status: Active Interventions: Assess patient/caregiver ability to perform ulcer/skin care regimen upon admission and as needed Assess ulceration(s) every visit Notes: LENA, FIELDHOUSE ([id number]) Electronic Signature(s) Signed: 09/18/2016 4:23:04 PM By: Alric Quan Entered By: Alric Quan on 09/18/2016 14:54:40 [name] ([id number]) -------------------------------------------------------------------------------- Pain Assessment Details Patient Name: [name]. Date of Service: 09/18/2016 2:30 PM Medical Record Number: [id number] Patient Account Number: [id number] Date of Birth/Sex: 18-Jul-1934 (82 y.o. Male) Treating RN: Ahmed Prima Primary Care Chanae Gemma: [name] Other Clinician: Referring Carreen Milius: [name] Treating Tamesha Ellerbrock/Extender: [name] in Treatment: 16 Active Problems Location of Pain Severity and Description of Pain Patient Has Paino No Site Locations With Dressing Change: No Pain Management and Medication Current Pain Management: Electronic Signature(s) Signed: 09/18/2016 4:23:04 PM By: Alric Quan Entered By: Alric Quan on 09/18/2016 14:42:05 [name] ([id number]) -------------------------------------------------------------------------------- Wound Assessment Details Patient Name: KOHLER, PELLERITO. Date of Service: 09/18/2016 2:30 PM Medical Record Number: [id number] Patient Account Number: [id number] Date of Birth/Sex: [birth year] (82 y.o. Male) Treating RN: Ahmed Prima Primary Care Cloyce Paterson: Gilford Rile  III, John Other Clinician: Referring Amarie Tarte: [name] Treating Ryane Canavan/Extender: [name] in Treatment: 16 Wound Status Wound Number: 1 Primary Pressure Ulcer Etiology: Wound Location: Left Malleolus - Lateral Wound  Open Wounding Event: Pressure Injury Status: Date Acquired: 05/01/2016 Comorbid Cataracts, Anemia, Sleep Apnea, Weeks Of Treatment: 16 History: Arrhythmia, Hypertension, Dementia Clustered Wound: No Photos Photo Uploaded By: Alric Quan on 09/18/2016 15:54:18 Wound Measurements Length: (cm) 0.2 Width: (cm) 0.1 Depth: (cm) 0.1 Area: (cm) 0.016 Volume: (cm) 0.002 % Reduction in Area: 91.8% % Reduction in Volume: 90% Epithelialization: None Tunneling: No Undermining: No Wound Description Classification: Category/Stage II Foul Odor Afte Wound Margin: Distinct, outline attached Slough/Fibrino Exudate Amount: Large Exudate Type: Serous Exudate Color: amber r Cleansing: No No Wound Bed Granulation Amount: None Present (0%) Exposed Structure Necrotic Amount: Large (67-100%) Fat Layer (Subcutaneous Tissue) Exposed: Yes Necrotic Quality: Adherent Slough Periwound Skin Texture KENDYN, ZAMAN. ([id number]) Texture Color No Abnormalities Noted: No No Abnormalities Noted: No Erythema: Yes Moisture Erythema Location: Circumferential No Abnormalities Noted: No Temperature / Pain Temperature: No Abnormality Tenderness on Palpation: Yes Wound Preparation Ulcer Cleansing: Rinsed/Irrigated with Saline Topical Anesthetic Applied: Other: lidocaine 4%, Electronic Signature(s) Signed: 09/18/2016 4:23:04 PM By: Alric Quan Entered By: Alric Quan on 09/18/2016 14:54:12 [name] ([id number]) -------------------------------------------------------------------------------- Vitals Details Patient Name: YUTAKA, HOLBERG. Date of Service: 09/18/2016 2:30 PM Medical Record Number: [id number] Patient Account Number: [id number] Date of Birth/Sex: 1934-07-01 (82 y.o. Male) Treating RN: Ahmed Prima Primary Care Hanan Mcwilliams: [name] Other Clinician: Referring Luismiguel Lamere: [name] Treating Vasilisa Vore/Extender: [name] in Treatment: 16 Vital  Signs Time Taken: 14:43 Temperature (F): 97.5 Height (in): 69 Pulse (bpm): 60 Weight (lbs): 174.8 Respiratory Rate (breaths/min): 18 Body Mass Index (BMI): 25.8 Blood Pressure (mmHg): 122/64 Reference Range: 80 - 120 mg / dl Electronic Signature(s) Signed: 09/18/2016 4:23:04 PM By: Alric Quan Entered By: Alric Quan on 09/18/2016 16:04:20

## 2016-09-20 NOTE — Progress Notes (Signed)
[name], [name] ([id number]) Visit Report for 09/18/2016 Chief Complaint Document Details Patient Name: [name], [name]. Date of Service: 09/18/2016 2:30 PM Medical Record Number: [id number] Patient Account Number: [id number] Date of Birth/Sex: 07/04/1934 (82 y.o. Male) Treating RN: Ahmed Prima Primary Care Provider: [name] Other Clinician: Referring Provider: [name] Treating Provider/Extender: [name] in Treatment: 16 Information Obtained from: Patient Chief Complaint Patient is at the clinic for treatment of an open pressure ulcer to the left ankle which she's had for about 5 weeks Electronic Signature(s) Signed: 09/18/2016 3:31:33 PM By: Christin Fudge MD, FACS Entered By: Christin Fudge on 09/18/2016 15:31:33 [name] ([id number]) -------------------------------------------------------------------------------- HPI Details Patient Name: [name]. Date of Service: 09/18/2016 2:30 PM Medical Record Number: [id number] Patient Account Number: [id number] Date of Birth/Sex: [birth year] (82 y.o. Male) Treating RN: Ahmed Prima Primary Care Provider: [name] Other Clinician: Referring Provider: [name] Treating Provider/Extender: [name] in Treatment: 16 History of Present Illness Location: left ankle laterally Quality: Patient reports experiencing a dull pain to affected area(s). Severity: Patient states wound are getting worse. Duration: Patient has had the wound for < 5 weeks prior to presenting for treatment Timing: Pain in wound is Intermittent (comes and goes Context: The wound appeared gradually over time Modifying Factors: Other treatment(s) tried include:put on Bactroban and doxycycline Associated Signs and Symptoms: Patient reports having difficulty standing for long periods. HPI Description: 82 year old gentleman who is known to have a history of dementia but is fairly functional and is able to  ambulate has been having a pressure ulcer to the left ankle due to laying in bed for longer than he should. Past history of atrial fibrillation, dementia, hyperlipidemia, hypertension. He's also had some orthopedic related surgeries in the past and was a former smoker and is given up in 1969. Recently seen by his PCP who put him on doxycycline and asked him to apply Bactroban and see [place] at the wound clinic. 06/05/2016 -- had a x-ray of the left ankle which showed no acute bony pathology. Electronic Signature(s) Signed: 09/18/2016 3:31:43 PM By: Christin Fudge MD, FACS Entered By: Christin Fudge on 09/18/2016 15:31:43 [name] ([id number]) -------------------------------------------------------------------------------- Physical Exam Details Patient Name: [name]. Date of Service: 09/18/2016 2:30 PM Medical Record Number: [id number] Patient Account Number: [id number] Date of Birth/Sex: 11/07/1934 (82 y.o. Male) Treating RN: Ahmed Prima Primary Care Provider: [name] Other Clinician: Referring Provider: [name] Treating Provider/Extender: [name] in Treatment: 16 Constitutional . Pulse regular. Respirations normal and unlabored. Afebrile. . Eyes Nonicteric. Reactive to light. Ears, Nose, Mouth, and Throat Lips, teeth, and gums WNL.Marland Kitchen Moist mucosa without lesions. Neck supple and nontender. No palpable supraclavicular or cervical adenopathy. Normal sized without goiter. Respiratory WNL. No retractions.. Breath sounds WNL, No rubs, rales, rhonchi, or wheeze.. Cardiovascular . Pedal Pulses WNL. No clubbing, cyanosis or edema. Lymphatic No adneopathy. No adenopathy. No adenopathy. Musculoskeletal Adexa without tenderness or enlargement.. Digits and nails w/o clubbing, cyanosis, infection, petechiae, ischemia, or inflammatory conditions.. Integumentary (Hair, Skin) No suspicious lesions. No crepitus or fluctuance. No peri-wound warmth or  erythema. No masses.Marland Kitchen Psychiatric Judgement and insight Intact.. No evidence of depression, anxiety, or agitation.. Notes the wound is looking excellent and gentle removal of the eschar over it does not reveal any deeper ulcer. Electronic Signature(s) Signed: 09/18/2016 3:32:10 PM By: Christin Fudge MD, FACS Entered By: Christin Fudge on 09/18/2016 15:32:10 [name] ([id number]) -------------------------------------------------------------------------------- Physician Orders Details  Patient Name: [name]. Date of Service: 09/18/2016 2:30 PM Medical Record Number: [id number] Patient Account Number: [id number] Date of Birth/Sex: [birth year] (82 y.o. Male) Treating RN: Ahmed Prima Primary Care Provider: [name] Other Clinician: Referring Provider: [name] Treating Provider/Extender: [name] in Treatment: 3 Verbal / Phone Orders: Yes Clinician: Carolyne Fiscal, Debi Read Back and Verified: Yes Diagnosis Coding Wound Cleansing Wound #1 Left,Lateral Malleolus o Clean wound with Normal Saline. o May Shower, gently pat wound dry prior to applying new dressing. Anesthetic Wound #1 Left,Lateral Malleolus o Topical Lidocaine 4% cream applied to wound bed prior to debridement - for clinic use Skin Barriers/Peri-Wound Care Wound #1 Left,Lateral Malleolus o Skin Prep Primary Wound Dressing Wound #1 Left,Lateral Malleolus o Hydrogel o Other: - sorbact Secondary Dressing Wound #1 Left,Lateral Malleolus o Dry Gauze o Boardered Foam Dressing Follow-up Appointments Wound #1 Left,Lateral Malleolus o Return Appointment in 1 week. Edema Control Wound #1 Left,Lateral Malleolus o Elevate legs to the level of the heart and pump ankles as often as possible Off-Loading Wound #1 Left,Lateral Malleolus o Turn and reposition every 2 hours o Other: - keep pressure off of affected area OLUMIDE, DOLINGER. ([id number]) Additional Orders  / Instructions Wound #1 Left,Lateral Malleolus o Increase protein intake. Medications-please add to medication list. Wound #1 Left,Lateral Malleolus o Other: - Vitamin A, Vitamin C, Zinc, MVI Electronic Signature(s) Signed: 09/18/2016 4:13:25 PM By: Christin Fudge MD, FACS Signed: 09/18/2016 4:23:04 PM By: Alric Quan Entered By: Alric Quan on 09/18/2016 14:55:09 [name] ([id number]) -------------------------------------------------------------------------------- Problem List Details Patient Name: MISHON, BLUBAUGH. Date of Service: 09/18/2016 2:30 PM Medical Record Number: [id number] Patient Account Number: [id number] Date of Birth/Sex: Jun 18, 1934 (82 y.o. Male) Treating RN: Ahmed Prima Primary Care Provider: [name] Other Clinician: Referring Provider: [name] Treating Provider/Extender: [name] in Treatment: 16 Active Problems ICD-10 Encounter Code Description Active Date Diagnosis [phone] Pressure ulcer of left ankle, stage 2 05/29/2016 Yes F02.80 Dementia in other diseases classified elsewhere without 05/29/2016 Yes behavioral disturbance Inactive Problems Resolved Problems Electronic Signature(s) Signed: 09/18/2016 3:31:20 PM By: Christin Fudge MD, FACS Entered By: Christin Fudge on 09/18/2016 15:31:20 [name] ([id number]) -------------------------------------------------------------------------------- Progress Note Details Patient Name: MAYANK, TEUSCHER. Date of Service: 09/18/2016 2:30 PM Medical Record Number: [id number] Patient Account Number: [id number] Date of Birth/Sex: 23-Nov-1934 (82 y.o. Male) Treating RN: Ahmed Prima Primary Care Provider: [name] Other Clinician: Referring Provider: [name] Treating Provider/Extender: [name] in Treatment: 16 Subjective Chief Complaint Information obtained from Patient Patient is at the clinic for treatment of an open pressure ulcer  to the left ankle which she's had for about 5 weeks History of Present Illness (HPI) The following HPI elements were documented for the patient's wound: Location: left ankle laterally Quality: Patient reports experiencing a dull pain to affected area(s). Severity: Patient states wound are getting worse. Duration: Patient has had the wound for < 5 weeks prior to presenting for treatment Timing: Pain in wound is Intermittent (comes and goes Context: The wound appeared gradually over time Modifying Factors: Other treatment(s) tried include:put on Bactroban and doxycycline Associated Signs and Symptoms: Patient reports having difficulty standing for long periods. 82 year old gentleman who is known to have a history of dementia but is fairly functional and is able to ambulate has been having a pressure ulcer to the left ankle due to laying in bed for longer than he should. Past history of atrial fibrillation, dementia, hyperlipidemia,  hypertension. He's also had some orthopedic related surgeries in the past and was a former smoker and is given up in 1969. Recently seen by his PCP who put him on doxycycline and asked him to apply Bactroban and see [place] at the wound clinic. 06/05/2016 -- had a x-ray of the left ankle which showed no acute bony pathology. Objective Constitutional Pulse regular. Respirations normal and unlabored. Afebrile. Vitals Time Taken: 2:43 PM, Height: 69 in, Weight: 174.8 lbs, BMI: 25.8, Temperature: 97.5 F, Pulse: 60 bpm, Respiratory Rate: 18 breaths/min, Blood Pressure: 122/64 mmHg. MARTINEZ, BOXX. ([id number]) Eyes Nonicteric. Reactive to light. Ears, Nose, Mouth, and Throat Lips, teeth, and gums WNL.Marland Kitchen Moist mucosa without lesions. Neck supple and nontender. No palpable supraclavicular or cervical adenopathy. Normal sized without goiter. Respiratory WNL. No retractions.. Breath sounds WNL, No rubs, rales, rhonchi, or wheeze.. Cardiovascular Pedal Pulses WNL. No  clubbing, cyanosis or edema. Lymphatic No adneopathy. No adenopathy. No adenopathy. Musculoskeletal Adexa without tenderness or enlargement.. Digits and nails w/o clubbing, cyanosis, infection, petechiae, ischemia, or inflammatory conditions.Marland Kitchen Psychiatric Judgement and insight Intact.. No evidence of depression, anxiety, or agitation.. General Notes: the wound is looking excellent and gentle removal of the eschar over it does not reveal any deeper ulcer. Integumentary (Hair, Skin) No suspicious lesions. No crepitus or fluctuance. No peri-wound warmth or erythema. No masses.. Wound #1 status is Open. Original cause of wound was Pressure Injury. The wound is located on the Left,Lateral Malleolus. The wound measures 0.2cm length x 0.1cm width x 0.1cm depth; 0.016cm^2 area and 0.002cm^3 volume. There is Fat Layer (Subcutaneous Tissue) Exposed exposed. There is no tunneling or undermining noted. There is a large amount of serous drainage noted. The wound margin is distinct with the outline attached to the wound base. There is no granulation within the wound bed. There is a large (67- 100%) amount of necrotic tissue within the wound bed including Adherent Slough. The periwound skin appearance exhibited: Erythema. The surrounding wound skin color is noted with erythema which is circumferential. Periwound temperature was noted as No Abnormality. The periwound has tenderness on palpation. Assessment Active Problems ICD-10 CHRISTOPHE, RISING ([id number]) L89.522 - Pressure ulcer of left ankle, stage 2 F02.80 - Dementia in other diseases classified elsewhere without behavioral disturbance Plan Wound Cleansing: Wound #1 Left,Lateral Malleolus: Clean wound with Normal Saline. May Shower, gently pat wound dry prior to applying new dressing. Anesthetic: Wound #1 Left,Lateral Malleolus: Topical Lidocaine 4% cream applied to wound bed prior to debridement - for clinic use Skin Barriers/Peri-Wound  Care: Wound #1 Left,Lateral Malleolus: Skin Prep Primary Wound Dressing: Wound #1 Left,Lateral Malleolus: Hydrogel Other: - sorbact Secondary Dressing: Wound #1 Left,Lateral Malleolus: Dry Gauze Boardered Foam Dressing Follow-up Appointments: Wound #1 Left,Lateral Malleolus: Return Appointment in 1 week. Edema Control: Wound #1 Left,Lateral Malleolus: Elevate legs to the level of the heart and pump ankles as often as possible Off-Loading: Wound #1 Left,Lateral Malleolus: Turn and reposition every 2 hours Other: - keep pressure off of affected area Additional Orders / Instructions: Wound #1 Left,Lateral Malleolus: Increase protein intake. Medications-please add to medication list.: Wound #1 Left,Lateral Malleolus: Other: - Vitamin A, Vitamin C, Zinc, MVI [place], Bruk M. ([id number]) I believe the wound may have closed with a supple scar and we will keep in under close observation for another week. If by next week there is no discharge or deterioration of the wound we will discharge him. Continue local care with sore back and a bordered foam Electronic Signature(s) Signed: 09/18/2016 4:15:29  PM By: Christin Fudge MD, FACS Previous Signature: 09/18/2016 3:33:06 PM Version By: Christin Fudge MD, FACS Entered By: Christin Fudge on 09/18/2016 16:15:29 [name] ([id number]) -------------------------------------------------------------------------------- SuperBill Details Patient Name: DAQUAVION, CATALA. Date of Service: 09/18/2016 Medical Record Number: [id number] Patient Account Number: [id number] Date of Birth/Sex: 1934/10/01 (82 y.o. Male) Treating RN: Ahmed Prima Primary Care Provider: [name] Other Clinician: Referring Provider: [name] Treating Provider/Extender: [name] in Treatment: 16 Diagnosis Coding ICD-10 Codes Code Description [phone] Pressure ulcer of left ankle, stage 2 F02.80 Dementia in other diseases classified elsewhere  without behavioral disturbance Facility Procedures CPT4 Code: [id number] Description: [phone] - WOUND CARE VISIT-LEV 2 EST PT Modifier: Quantity: 1 Physician Procedures CPT4: Description Modifier Quantity Code [id number] - WC PHYS LEVEL 3 - EST PT 1 ICD-10 Description Diagnosis L89.522 Pressure ulcer of left ankle, stage 2 F02.80 Dementia in other diseases classified elsewhere without behavioral disturbance Electronic Signature(s) Signed: 09/18/2016 4:13:25 PM By: Christin Fudge MD, FACS Signed: 09/18/2016 4:23:04 PM By: Alric Quan Previous Signature: 09/18/2016 3:33:23 PM Version By: Christin Fudge MD, FACS Entered By: Alric Quan on 09/18/2016 15:51:48

## 2016-09-25 ENCOUNTER — Encounter: Payer: PPO | Admitting: Surgery

## 2016-09-25 DIAGNOSIS — M9903 Segmental and somatic dysfunction of lumbar region: Secondary | ICD-10-CM | POA: Diagnosis not present

## 2016-09-25 DIAGNOSIS — M545 Low back pain: Secondary | ICD-10-CM | POA: Diagnosis not present

## 2016-09-25 DIAGNOSIS — M9904 Segmental and somatic dysfunction of sacral region: Secondary | ICD-10-CM | POA: Diagnosis not present

## 2016-09-25 DIAGNOSIS — M461 Sacroiliitis, not elsewhere classified: Secondary | ICD-10-CM | POA: Diagnosis not present

## 2016-09-25 DIAGNOSIS — L89529 Pressure ulcer of left ankle, unspecified stage: Secondary | ICD-10-CM | POA: Diagnosis not present

## 2016-09-25 DIAGNOSIS — L89522 Pressure ulcer of left ankle, stage 2: Secondary | ICD-10-CM | POA: Diagnosis not present

## 2016-09-27 NOTE — Progress Notes (Signed)
KONSTANTINE, GERVASI ([id number]) Visit Report for 09/25/2016 Arrival Information Details Patient Name: [name], [name]. Date of Service: 09/25/2016 2:30 PM Medical Record Number: [id number] Patient Account Number: [id number] Date of Birth/Sex: 1934/09/28 (82 y.o. Male) Treating RN: Ahmed Prima Primary Care Guhan Bruington: [name] Other Clinician: Referring Yaniah Thiemann: [name] Treating Jhonnie Aliano/Extender: [name] in Treatment: 31 Visit Information History Since Last Visit All ordered tests and consults were completed: No Patient Arrived: [name] Added or deleted any medications: No Arrival Time: 14:30 Any new allergies or adverse reactions: No Accompanied By: wife Had a fall or experienced change in No Transfer Assistance: EasyP[employer] activities of daily living that may affect Patient Lift risk of falls: Patient Identification Verified: Yes Signs or symptoms of abuse/neglect since last No Secondary Verification Process Yes visito Completed: Hospitalized since last visit: No Patient Requires Transmission- No Has Dressing in Place as Prescribed: Yes Based Precautions: Pain Present Now: No Patient Has Alerts: Yes Patient Alerts: ASA Electronic Signature(s) Signed: 09/25/2016 4:08:27 PM By: Alric Quan Entered By: Alric Quan on 09/25/2016 14:32:12 [name] [name] ([id number]) -------------------------------------------------------------------------------- Clinic Level of Care Assessment Details Patient Name: [name] [name]. Date of Service: 09/25/2016 2:30 PM Medical Record Number: [id number] Patient Account Number: [id number] Date of Birth/Sex: [birth year] (82 y.o. Male) Treating RN: Ahmed Prima Primary Care Robby Pirani: [name] Other Clinician: Referring Joshua Zeringue: [name] Treating Windel Keziah/Extender: [name] in Treatment: 17 Clinic Level of Care Assessment Items TOOL 4 Quantity Score X - Use when only an EandM is  performed on FOLLOW-UP visit 1 0 ASSESSMENTS - Nursing Assessment / Reassessment X - Reassessment of Co-morbidities (includes updates in patient status) 1 10 X - Reassessment of Adherence to Treatment Plan 1 5 ASSESSMENTS - Wound and Skin Assessment / Reassessment X - Simple Wound Assessment / Reassessment - one wound 1 5 []  - Complex Wound Assessment / Reassessment - multiple wounds 0 []  - Dermatologic / Skin Assessment (not related to wound area) 0 ASSESSMENTS - Focused Assessment []  - Circumferential Edema Measurements - multi extremities 0 []  - Nutritional Assessment / Counseling / Intervention 0 []  - Lower Extremity Assessment (monofilament, tuning fork, pulses) 0 []  - Peripheral Arterial Disease Assessment (using hand held doppler) 0 ASSESSMENTS - Ostomy and/or Continence Assessment and Care []  - Incontinence Assessment and Management 0 []  - Ostomy Care Assessment and Management (repouching, etc.) 0 PROCESS - Coordination of Care X - Simple Patient / Family Education for ongoing care 1 15 []  - Complex (extensive) Patient / Family Education for ongoing care 0 []  - Staff obtains Programmer, systems, Records, Test Results / Process Orders 0 []  - Staff telephones HHA, Nursing Homes / Clarify orders / etc 0 []  - Routine Transfer to another Facility (non-emergent condition) 0 [name], [name]. ([id number]) []  - Routine Hospital Admission (non-emergent condition) 0 []  - New Admissions / Biomedical engineer / Ordering NPWT, Apligraf, etc. 0 []  - Emergency Hospital Admission (emergent condition) 0 X - Simple Discharge Coordination 1 10 []  - Complex (extensive) Discharge Coordination 0 PROCESS - Special Needs []  - Pediatric / Minor Patient Management 0 []  - Isolation Patient Management 0 []  - Hearing / Language / Visual special needs 0 []  - Assessment of Community assistance (transportation, D/C planning, etc.) 0 []  - Additional assistance / Altered mentation 0 []  - Support Surface(s)  Assessment (bed, cushion, seat, etc.) 0 INTERVENTIONS - Wound Cleansing / Measurement X - Simple Wound Cleansing - one wound 1 5 []  - Complex Wound Cleansing -  multiple wounds 0 X - Wound Imaging (photographs - any number of wounds) 1 5 []  - Wound Tracing (instead of photographs) 0 []  - Simple Wound Measurement - one wound 0 []  - Complex Wound Measurement - multiple wounds 0 INTERVENTIONS - Wound Dressings []  - Small Wound Dressing one or multiple wounds 0 []  - Medium Wound Dressing one or multiple wounds 0 []  - Large Wound Dressing one or multiple wounds 0 []  - Application of Medications - topical 0 []  - Application of Medications - injection 0 INTERVENTIONS - Miscellaneous []  - External ear exam 0 [name], [name]. ([id number]) []  - Specimen Collection (cultures, biopsies, blood, body fluids, etc.) 0 []  - Specimen(s) / Culture(s) sent or taken to Lab for analysis 0 []  - Patient Transfer (multiple staff / [name] / Similar devices) 0 []  - Simple Staple / Suture removal (25 or less) 0 []  - Complex Staple / Suture removal (26 or more) 0 []  - Hypo / Hyperglycemic Management (close monitor of Blood Glucose) 0 []  - Ankle / Brachial Index (ABI) - do not check if billed separately 0 X - Vital Signs 1 5 Has the patient been seen at the hospital within the last three years: Yes Total Score: 60 Level Of Care: New/Established - Level 2 Electronic Signature(s) Signed: 09/25/2016 4:08:27 PM By: Alric Quan Entered By: Alric Quan on 09/25/2016 Inwood, [name] ([id number]) -------------------------------------------------------------------------------- Encounter Discharge Information Details Patient Name: [name], [name]. Date of Service: 09/25/2016 2:30 PM Medical Record Number: [id number] Patient Account Number: [id number] Date of Birth/Sex: 09/28/1934 (82 y.o. Male) Treating RN: Ahmed Prima Primary Care Zhuri Krass: [name] Other Clinician: Referring  Renata Gambino: [name] Treating Ladislao Cohenour/Extender: [name] in Treatment: 7 Encounter Discharge Information Items Discharge Pain Level: 0 Discharge Condition: Stable Ambulatory Status: Cane Discharge Destination: Home Transportation: Private Auto Accompanied By: wife Schedule Follow-up Appointment: Yes Medication Reconciliation completed No and provided to Patient/Care Felton Buczynski: Provided on Clinical Summary of Care: 09/25/2016 Form Type Recipient Paper Patient ET Electronic Signature(s) Signed: 09/25/2016 2:55:57 PM By: Ruthine Dose Entered By: Ruthine Dose on 09/25/2016 14:55:57 [name] [name] ([id number]) -------------------------------------------------------------------------------- Lower Extremity Assessment Details Patient Name: [name] [name]. Date of Service: 09/25/2016 2:30 PM Medical Record Number: [id number] Patient Account Number: [id number] Date of Birth/Sex: August 09, 1934 (82 y.o. Male) Treating RN: Ahmed Prima Primary Care Elsey Holts: [name] Other Clinician: Referring Brynnlie Unterreiner: [name] Treating Lucindy Borel/Extender: [name] in Treatment: 17 Vascular Assessment Pulses: Dorsalis Pedis Palpable: [Left:Yes] Posterior Tibial Extremity colors, hair growth, and conditions: Extremity Color: [Left:Normal] Temperature of Extremity: [Left:Warm] Capillary Refill: [Left:< 3 seconds] Toe Nail Assessment Left: Right: Thick: No Discolored: No Deformed: No Improper Length and Hygiene: No Electronic Signature(s) Signed: 09/25/2016 4:08:27 PM By: Alric Quan Entered By: Alric Quan on 09/25/2016 14:47:41 Fleer, [name] ([id number]) -------------------------------------------------------------------------------- Multi Wound Chart Details Patient Name: [name] [name]. Date of Service: 09/25/2016 2:30 PM Medical Record Number: [id number] Patient Account Number: [id number] Date of Birth/Sex: 1934-08-22 (82 y.o.  Male) Treating RN: Ahmed Prima Primary Care Umaima Scholten: [name] Other Clinician: Referring Tishina Lown: [name] Treating Grisell Bissette/Extender: [name] in Treatment: 17 Vital Signs Height(in): 69 Pulse(bpm): 53 Weight(lbs): 174.8 Blood Pressure 139/55 (mmHg): Body Mass Index(BMI): 26 Temperature(F): 97.4 Respiratory Rate 18 (breaths/min): Photos: [1:No Photos] [N/A:N/A] Wound Location: [1:Left Malleolus - Lateral] [N/A:N/A] Wounding Event: [1:Pressure Injury] [N/A:N/A] Primary Etiology: [1:Pressure Ulcer] [N/A:N/A] Comorbid History: [1:Cataracts, Anemia, Sleep Apnea, Arrhythmia, Hypertension, Dementia] [N/A:N/A] Date Acquired: [1:05/01/2016] [N/A:N/A] Weeks  of Treatment: [1:17] [N/A:N/A] Wound Status: [1:Open] [N/A:N/A] Measurements L x W x D 0x0x0 [N/A:N/A] (cm) Area (cm) : [1:0] [N/A:N/A] Volume (cm) : [1:0] [N/A:N/A] % Reduction in Area: [1:100.00%] [N/A:N/A] % Reduction in Volume: 100.00% [N/A:N/A] Classification: [1:Category/Stage II] [N/A:N/A] Exudate Amount: [1:None Present] [N/A:N/A] Wound Margin: [1:Distinct, outline attached] [N/A:N/A] Granulation Amount: [1:None Present (0%)] [N/A:N/A] Necrotic Amount: [1:None Present (0%)] [N/A:N/A] Exposed Structures: [1:Fat Layer (Subcutaneous Tissue) Exposed: Yes] [N/A:N/A] Epithelialization: [1:Large (67-100%)] [N/A:N/A] Periwound Skin Texture: No Abnormalities Noted [N/A:N/A] Periwound Skin [1:No Abnormalities Noted] [N/A:N/A] Moisture: Periwound Skin Color: Erythema: Yes [N/A:N/A] Erythema Location: [1:Circumferential] [N/A:N/A] Temperature: No Abnormality N/A N/A Tenderness on Yes N/A N/A Palpation: Wound Preparation: Ulcer Cleansing: N/A N/A Rinsed/Irrigated with Saline Topical Anesthetic Applied: None Treatment Notes Electronic Signature(s) Signed: 09/25/2016 2:55:50 PM By: Christin Fudge MD, FACS Entered By: Christin Fudge on 09/25/2016 14:55:50 [name] [name]  ([id number]) -------------------------------------------------------------------------------- Sharpes Details Patient Name: [name], [name]. Date of Service: 09/25/2016 2:30 PM Medical Record Number: [id number] Patient Account Number: [id number] Date of Birth/Sex: Jan 31, 1935 (82 y.o. Male) Treating RN: Ahmed Prima Primary Care Jericho Cieslik: [name] Other Clinician: Referring Kitara Hebb: [name] Treating Smriti Barkow/Extender: [name] in Treatment: 17 Active Inactive Electronic Signature(s) Signed: 09/25/2016 4:08:27 PM By: Alric Quan Entered By: Alric Quan on 09/25/2016 14:49:54 [name] [name] ([id number]) -------------------------------------------------------------------------------- Pain Assessment Details Patient Name: [name], [name]. Date of Service: 09/25/2016 2:30 PM Medical Record Number: [id number] Patient Account Number: [id number] Date of Birth/Sex: 08-15-1934 (82 y.o. Male) Treating RN: Ahmed Prima Primary Care Shantana Christon: [name] Other Clinician: Referring Ganesh Deeg: [name] Treating Fischer Halley/Extender: [name] in Treatment: 15 Active Problems Location of Pain Severity and Description of Pain Patient Has Paino No Site Locations With Dressing Change: No Pain Management and Medication Current Pain Management: Electronic Signature(s) Signed: 09/25/2016 4:08:27 PM By: Alric Quan Entered By: Alric Quan on 09/25/2016 14:33:52 [name] [name] ([id number]) -------------------------------------------------------------------------------- Patient/Caregiver Education Details Patient Name: [name], [name]. Date of Service: 09/25/2016 2:30 PM Medical Record Number: [id number] Patient Account Number: [id number] Date of Birth/Gender: July 26, 1934 (82 y.o. Male) Treating RN: Ahmed Prima Primary Care Physician: [name] Other Clinician: Referring Physician: [name] Treating Physician/Extender: [name] in Treatment: 17 Education Assessment Education Provided To: Patient Education Topics Provided Wound/Skin Impairment: Other: Keep protected, clean and dry. Please call our office if you have any questions or Handouts: concerns. Methods: Explain/Verbal Responses: State content correctly Electronic Signature(s) Signed: 09/25/2016 4:08:27 PM By: Alric Quan Entered By: Alric Quan on 09/25/2016 14:49:34 [name] [name] ([id number]) -------------------------------------------------------------------------------- Wound Assessment Details Patient Name: [name], [name]. Date of Service: 09/25/2016 2:30 PM Medical Record Number: [id number] Patient Account Number: [id number] Date of Birth/Sex: 01-09-1935 (82 y.o. Male) Treating RN: Ahmed Prima Primary Care Elzie Knisley: [name] Other Clinician: Referring Kal Chait: [name] Treating Kamela Blansett/Extender: [name] in Treatment: 17 Wound Status Wound Number: 1 Primary Pressure Ulcer Etiology: Wound Location: Left Malleolus - Lateral Wound Open Wounding Event: Pressure Injury Status: Date Acquired: 05/01/2016 Comorbid Cataracts, Anemia, Sleep Apnea, Weeks Of Treatment: 17 History: Arrhythmia, Hypertension, Dementia Clustered Wound: No Photos Photo Uploaded By: Alric Quan on 09/25/2016 16:04:38 Wound Measurements Length: (cm) 0 % Reducti Width: (cm) 0 % Reducti Depth: (cm) 0 Epithelia Area: (cm) 0 Tunnelin Volume: (cm) 0 Undermin on in Area: 100% on in Volume: 100% lization: Large (67-100%) g: No ing: No Wound Description Classification: Category/Stage II Wound Margin: Distinct, outline attached Exudate Amount: None  Present Foul Odor After Cleansing: No Slough/Fibrino No Wound Bed Granulation Amount: None Present (0%) Exposed Structure Necrotic Amount: None Present (0%) Fat Layer (Subcutaneous Tissue) Exposed:  Yes Periwound Skin Texture Texture Color No Abnormalities Noted: No No Abnormalities Noted: No Erythema: Yes Moisture [name], [name]. ([id number]) No Abnormalities Noted: No Erythema Location: Circumferential Temperature / Pain Temperature: No Abnormality Tenderness on Palpation: Yes Wound Preparation Ulcer Cleansing: Rinsed/Irrigated with Saline Topical Anesthetic Applied: None Electronic Signature(s) Signed: 09/25/2016 4:08:27 PM By: Alric Quan Entered By: Alric Quan on 09/25/2016 14:47:29 Defrancesco, [name] ([id number]) -------------------------------------------------------------------------------- Vitals Details Patient Name: [name] [name]. Date of Service: 09/25/2016 2:30 PM Medical Record Number: [id number] Patient Account Number: [id number] Date of Birth/Sex: May 01, 1935 (82 y.o. Male) Treating RN: Ahmed Prima Primary Care Shanequa Whitenight: [name] Other Clinician: Referring Deejay Koppelman: [name] Treating Cordero Surette/Extender: [name] in Treatment: 17 Vital Signs Time Taken: 14:33 Temperature (F): 97.4 Height (in): 69 Pulse (bpm): 53 Weight (lbs): 174.8 Respiratory Rate (breaths/min): 18 Body Mass Index (BMI): 25.8 Blood Pressure (mmHg): 139/55 Reference Range: 80 - 120 mg / dl Electronic Signature(s) Signed: 09/25/2016 4:08:27 PM By: Alric Quan Entered By: Alric Quan on 09/25/2016 14:34:38

## 2016-09-27 NOTE — Progress Notes (Signed)
WILFERD, RITSON ([id number]) Visit Report for 09/25/2016 Chief Complaint Document Details Patient Name: [name], [name]. Date of Service: 09/25/2016 2:30 PM Medical Record Number: [id number] Patient Account Number: [id number] Date of Birth/Sex: 1935-05-03 (82 y.o. Male) Treating RN: Ahmed Prima Primary Care Provider: [name] Other Clinician: Referring Provider: [name] Treating Provider/Extender: [name] in Treatment: 17 Information Obtained from: Patient Chief Complaint Patient is at the clinic for treatment of an open pressure ulcer to the left ankle which she's had for about 5 weeks Electronic Signature(s) Signed: 09/25/2016 2:55:57 PM By: Christin Fudge MD, FACS Entered By: Christin Fudge on 09/25/2016 14:55:57 [name] ([id number]) -------------------------------------------------------------------------------- HPI Details Patient Name: [name]. Date of Service: 09/25/2016 2:30 PM Medical Record Number: [id number] Patient Account Number: [id number] Date of Birth/Sex: [birth year] (82 y.o. Male) Treating RN: Ahmed Prima Primary Care Provider: [name] Other Clinician: Referring Provider: [name] Treating Provider/Extender: [name] in Treatment: 17 History of Present Illness Location: left ankle laterally Quality: Patient reports experiencing a dull pain to affected area(s). Severity: Patient states wound are getting worse. Duration: Patient has had the wound for < 5 weeks prior to presenting for treatment Timing: Pain in wound is Intermittent (comes and goes Context: The wound appeared gradually over time Modifying Factors: Other treatment(s) tried include:put on Bactroban and doxycycline Associated Signs and Symptoms: Patient reports having difficulty standing for long periods. HPI Description: 82 year old gentleman who is known to have a history of dementia but is fairly functional and is able to  ambulate has been having a pressure ulcer to the left ankle due to laying in bed for longer than he should. Past history of atrial fibrillation, dementia, hyperlipidemia, hypertension. He's also had some orthopedic related surgeries in the past and was a former smoker and is given up in 1969. Recently seen by his PCP who put him on doxycycline and asked him to apply Bactroban and see [place] at the wound clinic. 06/05/2016 -- had a x-ray of the left ankle which showed no acute bony pathology. Electronic Signature(s) Signed: 09/25/2016 2:56:16 PM By: Christin Fudge MD, FACS Entered By: Christin Fudge on 09/25/2016 14:56:15 [name] ([id number]) -------------------------------------------------------------------------------- Physical Exam Details Patient Name: [name]. Date of Service: 09/25/2016 2:30 PM Medical Record Number: [id number] Patient Account Number: [id number] Date of Birth/Sex: 12-16-1934 (82 y.o. Male) Treating RN: Ahmed Prima Primary Care Provider: [name] Other Clinician: Referring Provider: [name] Treating Provider/Extender: [name] in Treatment: 17 Constitutional . Pulse regular. Respirations normal and unlabored. Afebrile. . Eyes Nonicteric. Reactive to light. Ears, Nose, Mouth, and Throat Lips, teeth, and gums WNL.Marland Kitchen Moist mucosa without lesions. Neck supple and nontender. No palpable supraclavicular or cervical adenopathy. Normal sized without goiter. Respiratory WNL. No retractions.. Breath sounds WNL, No rubs, rales, rhonchi, or wheeze.. Cardiovascular Heart rhythm and rate regular, no murmur or gallop.. Pedal Pulses WNL. No clubbing, cyanosis or edema. Chest Breasts symmetical and no nipple discharge.. Breast tissue WNL, no masses, lumps, or tenderness.. Lymphatic No adneopathy. No adenopathy. No adenopathy. Musculoskeletal Adexa without tenderness or enlargement.. Digits and nails w/o clubbing, cyanosis, infection,  petechiae, ischemia, or inflammatory conditions.. Integumentary (Hair, Skin) No suspicious lesions. No crepitus or fluctuance. No peri-wound warmth or erythema. No masses.Marland Kitchen Psychiatric Judgement and insight Intact.. No evidence of depression, anxiety, or agitation.. Notes the wound is healed and he has a supple scar. Electronic Signature(s) Signed: 09/25/2016 2:58:33 PM By: Christin Fudge MD, FACS Entered  By: Christin Fudge on 09/25/2016 14:58:32 [name] ([id number]) -------------------------------------------------------------------------------- Physician Orders Details Patient Name: [name]. Date of Service: 09/25/2016 2:30 PM Medical Record Number: [id number] Patient Account Number: [id number] Date of Birth/Sex: November 05, 1934 (82 y.o. Male) Treating RN: Ahmed Prima Primary Care Provider: [name] Other Clinician: Referring Provider: [name] Treating Provider/Extender: [name] in Treatment: 47 Verbal / Phone Orders: Yes Clinician: Carolyne Fiscal, Debi Read Back and Verified: Yes Diagnosis Coding Discharge From [facility] Services o Discharge from [place] protected, clean and dry. Please call our office if you have any questions or concerns. Electronic Signature(s) Signed: 09/25/2016 4:08:27 PM By: Alric Quan Signed: 09/25/2016 4:20:02 PM By: Christin Fudge MD, FACS Entered By: Alric Quan on 09/25/2016 14:48:57 [name] ([id number]) -------------------------------------------------------------------------------- Problem List Details Patient Name: [name]. Date of Service: 09/25/2016 2:30 PM Medical Record Number: [id number] Patient Account Number: [id number] Date of Birth/Sex: 1934/09/28 (82 y.o. Male) Treating RN: Ahmed Prima Primary Care Provider: [name] Other Clinician: Referring Provider: [name] Treating Provider/Extender: [name] in Treatment: 17 Active  Problems ICD-10 Encounter Code Description Active Date Diagnosis [id number] Pressure ulcer of left ankle, stage 2 05/29/2016 Yes F02.80 Dementia in other diseases classified elsewhere without 05/29/2016 Yes behavioral disturbance Inactive Problems Resolved Problems Electronic Signature(s) Signed: 09/25/2016 2:55:43 PM By: Christin Fudge MD, FACS Entered By: Christin Fudge on 09/25/2016 14:55:43 [name] ([id number]) -------------------------------------------------------------------------------- Progress Note Details Patient Name: YANNI, QUIROA. Date of Service: 09/25/2016 2:30 PM Medical Record Number: [id number] Patient Account Number: [id number] Date of Birth/Sex: 11/08/1934 (82 y.o. Male) Treating RN: Ahmed Prima Primary Care Provider: [name] Other Clinician: Referring Provider: [name] Treating Provider/Extender: [name] in Treatment: 17 Subjective Chief Complaint Information obtained from Patient Patient is at the clinic for treatment of an open pressure ulcer to the left ankle which she's had for about 5 weeks History of Present Illness (HPI) The following HPI elements were documented for the patient's wound: Location: left ankle laterally Quality: Patient reports experiencing a dull pain to affected area(s). Severity: Patient states wound are getting worse. Duration: Patient has had the wound for < 5 weeks prior to presenting for treatment Timing: Pain in wound is Intermittent (comes and goes Context: The wound appeared gradually over time Modifying Factors: Other treatment(s) tried include:put on Bactroban and doxycycline Associated Signs and Symptoms: Patient reports having difficulty standing for long periods. 82 year old gentleman who is known to have a history of dementia but is fairly functional and is able to ambulate has been having a pressure ulcer to the left ankle due to laying in bed for longer than he should. Past  history of atrial fibrillation, dementia, hyperlipidemia, hypertension. He's also had some orthopedic related surgeries in the past and was a former smoker and is given up in 1969. Recently seen by his PCP who put him on doxycycline and asked him to apply Bactroban and see [place] at the wound clinic. 06/05/2016 -- had a x-ray of the left ankle which showed no acute bony pathology. Objective Constitutional Pulse regular. Respirations normal and unlabored. Afebrile. Vitals Time Taken: 2:33 PM, Height: 69 in, Weight: 174.8 lbs, BMI: 25.8, Temperature: 97.4 F, Pulse: 53 bpm, Respiratory Rate: 18 breaths/min, Blood Pressure: 139/55 mmHg. LEONTE, HORRIGAN. ([id number]) Eyes Nonicteric. Reactive to light. Ears, Nose, Mouth, and Throat Lips, teeth, and gums WNL.Marland Kitchen Moist mucosa without lesions. Neck supple and nontender. No palpable supraclavicular or cervical adenopathy.  Normal sized without goiter. Respiratory WNL. No retractions.. Breath sounds WNL, No rubs, rales, rhonchi, or wheeze.. Cardiovascular Heart rhythm and rate regular, no murmur or gallop.. Pedal Pulses WNL. No clubbing, cyanosis or edema. Chest Breasts symmetical and no nipple discharge.. Breast tissue WNL, no masses, lumps, or tenderness.. Lymphatic No adneopathy. No adenopathy. No adenopathy. Musculoskeletal Adexa without tenderness or enlargement.. Digits and nails w/o clubbing, cyanosis, infection, petechiae, ischemia, or inflammatory conditions.Marland Kitchen Psychiatric Judgement and insight Intact.. No evidence of depression, anxiety, or agitation.. General Notes: the wound is healed and he has a supple scar. Integumentary (Hair, Skin) No suspicious lesions. No crepitus or fluctuance. No peri-wound warmth or erythema. No masses.. Wound #1 status is Open. Original cause of wound was Pressure Injury. The wound is located on the Left,Lateral Malleolus. The wound measures 0cm length x 0cm width x 0cm depth; 0cm^2 area and 0cm^3 volume.  There is Fat Layer (Subcutaneous Tissue) Exposed exposed. There is no tunneling or undermining noted. There is a none present amount of drainage noted. The wound margin is distinct with the outline attached to the wound base. There is no granulation within the wound bed. There is no necrotic tissue within the wound bed. The periwound skin appearance exhibited: Erythema. The surrounding wound skin color is noted with erythema which is circumferential. Periwound temperature was noted as No Abnormality. The periwound has tenderness on palpation. Assessment Active Problems DONALD, MEMOLI ([id number]) ICD-10 L89.522 - Pressure ulcer of left ankle, stage 2 F02.80 - Dementia in other diseases classified elsewhere without behavioral disturbance Plan Discharge From [facility] Services: Discharge from [place] protected, clean and dry. Please call our office if you have any questions or concerns. The wound is healed. He has not had any drainage from this for over 2 weeks and after review I have asked him to protect this supple scar over the next 2 week. He is discharged on the wound care services and be seen back only if needed. Electronic Signature(s) Signed: 09/25/2016 2:59:20 PM By: Christin Fudge MD, FACS Entered By: Christin Fudge on 09/25/2016 14:59:19 [name] ([id number]) -------------------------------------------------------------------------------- SuperBill Details Patient Name: BLANCA, THORNTON. Date of Service: 09/25/2016 Medical Record Number: [id number] Patient Account Number: [id number] Date of Birth/Sex: 02/11/1935 (82 y.o. Male) Treating RN: Ahmed Prima Primary Care Provider: [name] Other Clinician: Referring Provider: [name] Treating Provider/Extender: [name] in Treatment: 17 Diagnosis Coding ICD-10 Codes Code Description [phone] Pressure ulcer of left ankle, stage 2 F02.80 Dementia in other diseases classified  elsewhere without behavioral disturbance Facility Procedures CPT4 Code: [id number] Description: [id number] - WOUND CARE VISIT-LEV 2 EST PT Modifier: Quantity: 1 Physician Procedures CPT4: Description Modifier Quantity Code [id number] - WC PHYS LEVEL 2 - EST PT 1 ICD-10 Description Diagnosis L89.522 Pressure ulcer of left ankle, stage 2 F02.80 Dementia in other diseases classified elsewhere without behavioral disturbance Electronic Signature(s) Signed: 09/25/2016 4:08:27 PM By: Alric Quan Signed: 09/25/2016 4:20:02 PM By: Christin Fudge MD, FACS Previous Signature: 09/25/2016 2:59:33 PM Version By: Christin Fudge MD, FACS Entered By: Alric Quan on 09/25/2016 15:04:16

## 2016-09-30 DIAGNOSIS — L89520 Pressure ulcer of left ankle, unstageable: Secondary | ICD-10-CM | POA: Diagnosis not present

## 2016-10-02 ENCOUNTER — Ambulatory Visit: Payer: PPO | Admitting: Surgery

## 2016-10-09 ENCOUNTER — Ambulatory Visit: Payer: PPO | Admitting: Surgery

## 2016-10-13 DIAGNOSIS — L89520 Pressure ulcer of left ankle, unstageable: Secondary | ICD-10-CM | POA: Diagnosis not present

## 2016-10-13 DIAGNOSIS — M25562 Pain in left knee: Secondary | ICD-10-CM | POA: Diagnosis not present

## 2016-10-13 DIAGNOSIS — M5442 Lumbago with sciatica, left side: Secondary | ICD-10-CM | POA: Diagnosis not present

## 2016-10-13 DIAGNOSIS — M1711 Unilateral primary osteoarthritis, right knee: Secondary | ICD-10-CM | POA: Diagnosis not present

## 2016-10-14 DIAGNOSIS — L89520 Pressure ulcer of left ankle, unstageable: Secondary | ICD-10-CM | POA: Diagnosis not present

## 2016-10-16 ENCOUNTER — Ambulatory Visit: Payer: PPO | Admitting: Surgery

## 2016-10-16 ENCOUNTER — Encounter: Payer: PPO | Attending: Surgery | Admitting: Surgery

## 2016-10-16 DIAGNOSIS — L89522 Pressure ulcer of left ankle, stage 2: Secondary | ICD-10-CM | POA: Diagnosis not present

## 2016-10-16 DIAGNOSIS — I10 Essential (primary) hypertension: Secondary | ICD-10-CM | POA: Diagnosis not present

## 2016-10-16 DIAGNOSIS — Z87891 Personal history of nicotine dependence: Secondary | ICD-10-CM | POA: Diagnosis not present

## 2016-10-16 DIAGNOSIS — D649 Anemia, unspecified: Secondary | ICD-10-CM | POA: Insufficient documentation

## 2016-10-16 DIAGNOSIS — E785 Hyperlipidemia, unspecified: Secondary | ICD-10-CM | POA: Diagnosis not present

## 2016-10-16 DIAGNOSIS — F0280 Dementia in other diseases classified elsewhere without behavioral disturbance: Secondary | ICD-10-CM | POA: Diagnosis not present

## 2016-10-16 DIAGNOSIS — I4891 Unspecified atrial fibrillation: Secondary | ICD-10-CM | POA: Diagnosis not present

## 2016-10-16 DIAGNOSIS — L89529 Pressure ulcer of left ankle, unspecified stage: Secondary | ICD-10-CM | POA: Diagnosis not present

## 2016-10-18 NOTE — Progress Notes (Signed)
DEMARRIO, MENGES ([id number]) Visit Report for 10/16/2016 Arrival Information Details Patient Name: [name], [name]. Date of Service: 10/16/2016 3:30 PM Medical Record Number: [id number] Patient Account Number: [id number] Date of Birth/Sex: 12/04/1934 (82 y.o. Male) Treating RN: Ahmed Prima Primary Care Lyne Khurana: [name] Other Clinician: Referring Bessie Boyte: [name] Treating Maddux Vanscyoc/Extender: [name] in Treatment: 26 Visit Information History Since Last Visit All ordered tests and consults were completed: No Patient Arrived: [name] Added or deleted any medications: No Arrival Time: 15:39 Any new allergies or adverse reactions: No Accompanied By: wife Had a fall or experienced change in No Transfer Assistance: EasyP[employer] activities of daily living that may affect Patient Lift risk of falls: Patient Identification Verified: Yes Signs or symptoms of abuse/neglect since last No Secondary Verification Process Yes visito Completed: Hospitalized since last visit: No Patient Requires Transmission- No Pain Present Now: No Based Precautions: Patient Has Alerts: Yes Patient Alerts: ASA Electronic Signature(s) Signed: 10/16/2016 5:25:16 PM By: Alric Quan Entered By: Alric Quan on 10/16/2016 15:40:02 [name] ([id number]) -------------------------------------------------------------------------------- Clinic Level of Care Assessment Details Patient Name: [name]. Date of Service: 10/16/2016 3:30 PM Medical Record Number: [id number] Patient Account Number: [id number] Date of Birth/Sex: [birth year] (82 y.o. Male) Treating RN: Ahmed Prima Primary Care Brenson Hartman: [name] Other Clinician: Referring Rohith Fauth: [name] Treating Lin Hackmann/Extender: [name] in Treatment: 20 Clinic Level of Care Assessment Items TOOL 4 Quantity Score '[]'  - Use when only an EandM is performed on FOLLOW-UP visit  0 ASSESSMENTS - Nursing Assessment / Reassessment '[]'  - Reassessment of Co-morbidities (includes updates in patient status) 0 X - Reassessment of Adherence to Treatment Plan 1 5 ASSESSMENTS - Wound and Skin Assessment / Reassessment X - Simple Wound Assessment / Reassessment - one wound 1 5 '[]'  - Complex Wound Assessment / Reassessment - multiple wounds 0 '[]'  - Dermatologic / Skin Assessment (not related to wound area) 0 ASSESSMENTS - Focused Assessment '[]'  - Circumferential Edema Measurements - multi extremities 0 '[]'  - Nutritional Assessment / Counseling / Intervention 0 '[]'  - Lower Extremity Assessment (monofilament, tuning fork, pulses) 0 '[]'  - Peripheral Arterial Disease Assessment (using hand held doppler) 0 ASSESSMENTS - Ostomy and/or Continence Assessment and Care '[]'  - Incontinence Assessment and Management 0 '[]'  - Ostomy Care Assessment and Management (repouching, etc.) 0 PROCESS - Coordination of Care X - Simple Patient / Family Education for ongoing care 1 15 '[]'  - Complex (extensive) Patient / Family Education for ongoing care 0 '[]'  - Staff obtains Programmer, systems, Records, Test Results / Process Orders 0 '[]'  - Staff telephones HHA, Nursing Homes / Clarify orders / etc 0 '[]'  - Routine Transfer to another Facility (non-emergent condition) 0 KONG, PACKETT. ([id number]) '[]'  - Routine Hospital Admission (non-emergent condition) 0 '[]'  - New Admissions / Biomedical engineer / Ordering NPWT, Apligraf, etc. 0 '[]'  - Emergency Hospital Admission (emergent condition) 0 X - Simple Discharge Coordination 1 10 '[]'  - Complex (extensive) Discharge Coordination 0 PROCESS - Special Needs '[]'  - Pediatric / Minor Patient Management 0 '[]'  - Isolation Patient Management 0 '[]'  - Hearing / Language / Visual special needs 0 '[]'  - Assessment of Community assistance (transportation, D/C planning, etc.) 0 '[]'  - Additional assistance / Altered mentation 0 '[]'  - Support Surface(s) Assessment (bed, cushion, seat, etc.)  0 INTERVENTIONS - Wound Cleansing / Measurement X - Simple Wound Cleansing - one wound 1 5 '[]'  - Complex Wound Cleansing - multiple wounds 0 X - Wound Imaging (photographs -  any number of wounds) 1 5 '[]'  - Wound Tracing (instead of photographs) 0 X - Simple Wound Measurement - one wound 1 5 '[]'  - Complex Wound Measurement - multiple wounds 0 INTERVENTIONS - Wound Dressings X - Small Wound Dressing one or multiple wounds 1 10 '[]'  - Medium Wound Dressing one or multiple wounds 0 '[]'  - Large Wound Dressing one or multiple wounds 0 '[]'  - Application of Medications - topical 0 '[]'  - Application of Medications - injection 0 INTERVENTIONS - Miscellaneous '[]'  - External ear exam 0 FERRON, ISHMAEL. ([id number]) '[]'  - Specimen Collection (cultures, biopsies, blood, body fluids, etc.) 0 '[]'  - Specimen(s) / Culture(s) sent or taken to Lab for analysis 0 '[]'  - Patient Transfer (multiple staff / [name] / Similar devices) 0 '[]'  - Simple Staple / Suture removal (25 or less) 0 '[]'  - Complex Staple / Suture removal (26 or more) 0 '[]'  - Hypo / Hyperglycemic Management (close monitor of Blood Glucose) 0 '[]'  - Ankle / Brachial Index (ABI) - do not check if billed separately 0 X - Vital Signs 1 5 Has the patient been seen at the hospital within the last three years: Yes Total Score: 65 Level Of Care: New/Established - Level 2 Electronic Signature(s) Signed: 10/16/2016 5:25:16 PM By: Alric Quan Entered By: Alric Quan on 10/16/2016 16:11:50 [name] ([id number]) -------------------------------------------------------------------------------- Encounter Discharge Information Details Patient Name: [name]. Date of Service: 10/16/2016 3:30 PM Medical Record Number: [id number] Patient Account Number: [id number] Date of Birth/Sex: October 25, 1934 (82 y.o. Male) Treating RN: Ahmed Prima Primary Care Deloras Reichard: [name] Other Clinician: Referring Brayah Urquilla: [name] Treating  Marleigh Kaylor/Extender: [name] in Treatment: 20 Encounter Discharge Information Items Discharge Pain Level: 0 Discharge Condition: Stable Ambulatory Status: Cane Discharge Destination: Home Transportation: Private Auto Accompanied By: wife Schedule Follow-up Appointment: Yes Medication Reconciliation completed No and provided to Patient/Care Tiquan Bouch: Provided on Clinical Summary of Care: 10/16/2016 Form Type Recipient Paper Patient ET Electronic Signature(s) Signed: 10/16/2016 4:13:09 PM By: Ruthine Dose Entered By: Ruthine Dose on 10/16/2016 16:13:09 [name] ([id number]) -------------------------------------------------------------------------------- Lower Extremity Assessment Details Patient Name: BYRAN, BILOTTI. Date of Service: 10/16/2016 3:30 PM Medical Record Number: [id number] Patient Account Number: [id number] Date of Birth/Sex: November 30, 1934 (82 y.o. Male) Treating RN: Ahmed Prima Primary Care Tharon Kitch: [name] Other Clinician: Referring Saliah Crisp: [name] Treating Issacc Merlo/Extender: [name] in Treatment: 20 Vascular Assessment Pulses: Dorsalis Pedis Palpable: [Left:Yes] Posterior Tibial Extremity colors, hair growth, and conditions: Extremity Color: [Left:Normal] Temperature of Extremity: [Left:Warm] Capillary Refill: [Left:< 3 seconds] Toe Nail Assessment Left: Right: Thick: No Discolored: No Deformed: No Improper Length and Hygiene: No Electronic Signature(s) Signed: 10/16/2016 5:25:16 PM By: Alric Quan Entered By: Alric Quan on 10/16/2016 15:54:48 [name] ([id number]) -------------------------------------------------------------------------------- Multi Wound Chart Details Patient Name: [name]. Date of Service: 10/16/2016 3:30 PM Medical Record Number: [id number] Patient Account Number: [id number] Date of Birth/Sex: [birth year] (82 y.o. Male) Treating RN: Ahmed Prima Primary Care Delania Ferg: [name] Other Clinician: Referring Alizza Sacra: [name] Treating Tinsley Everman/Extender: [name] in Treatment: 20 Vital Signs Height(in): 69 Pulse(bpm): 56 Weight(lbs): 180 Blood Pressure 125/66 (mmHg): Body Mass Index(BMI): 27 Temperature(F): 98.2 Respiratory Rate 18 (breaths/min): Photos: [2:No Photos] [N/A:N/A] Wound Location: [2:Left, Lateral Malleolus] [N/A:N/A] Wounding Event: [2:Pressure Injury] [N/A:N/A] Primary Etiology: [2:Pressure Ulcer] [N/A:N/A] Comorbid History: [2:Cataracts, Anemia, Sleep Apnea, Arrhythmia, Hypertension, Dementia] [N/A:N/A] Date Acquired: [2:10/10/2016] [N/A:N/A] Weeks of Treatment: [2:0] [N/A:N/A] Wound Status: [2:Open] [N/A:N/A]  Measurements L x W x D 0.4x0.3x0.1 [N/A:N/A] (cm) Area (cm) : [2:0.094] [N/A:N/A] Volume (cm) : [2:0.009] [N/A:N/A] % Reduction in Area: [2:-203.20%] [N/A:N/A] % Reduction in Volume: -200.00% [N/A:N/A] Classification: [2:Category/Stage II] [N/A:N/A] Exudate Amount: [2:Large] [N/A:N/A] Exudate Type: [2:Serous] [N/A:N/A] Exudate Color: [2:amber] [N/A:N/A] Wound Margin: [2:Distinct, outline attached] [N/A:N/A] Granulation Amount: [2:Large (67-100%)] [N/A:N/A] Granulation Quality: [2:Red] [N/A:N/A] Necrotic Amount: [2:Small (1-33%)] [N/A:N/A] Epithelialization: [2:None] [N/A:N/A] Periwound Skin Texture: No Abnormalities Noted [N/A:N/A] Periwound Skin [2:No Abnormalities Noted] [N/A:N/A] Moisture: Periwound Skin Color: No Abnormalities Noted [N/A:N/A] Temperature: No Abnormality N/A N/A Tenderness on Yes N/A N/A Palpation: Wound Preparation: Ulcer Cleansing: N/A N/A Rinsed/Irrigated with Saline Topical Anesthetic Applied: Other: lidocaine 4% Procedures Performed: Cellular or Tissue Based N/A N/A Product Treatment Notes Electronic Signature(s) Signed: 10/16/2016 4:17:50 PM By: Christin Fudge MD, FACS Entered By: Christin Fudge on 10/16/2016  16:17:49 [name] ([id number]) -------------------------------------------------------------------------------- [facility] Details Patient Name: LONALD, TROIANI. Date of Service: 10/16/2016 3:30 PM Medical Record Number: [id number] Patient Account Number: [id number] Date of Birth/Sex: 1934-07-06 (82 y.o. Male) Treating RN: Ahmed Prima Primary Care Clella Mckeel: [name] Other Clinician: Referring Lelar Farewell: [name] Treating Svetlana Bagby/Extender: [name] in Treatment: 20 Active Inactive ` Abuse / Safety / Falls / Self Care Management Nursing Diagnoses: Potential for falls Goals: Patient will not experience any injury related to falls Date Initiated: 10/16/2016 Target Resolution Date: 01/10/2017 Goal Status: Active Patient will remain injury free Date Target Resolution Date Initiated: 05/29/2016 Inactivated: 09/25/2016 Date: 08/02/2016 Goal Status: Met Interventions: Assess fall risk on admission and as needed Assess impairment of mobility on admission and as needed per policy Notes: ` Nutrition Nursing Diagnoses: Imbalanced nutrition Goals: Patient/caregiver agrees to and verbalizes understanding of need to use nutritional supplements and/or vitamins as prescribed Date Initiated: 05/29/2016 Target Resolution Date: 01/10/2017 Goal Status: Active Interventions: Assess patient nutrition upon admission and as needed per policy Notes: JYLES, SONTAG ([id number]) Orientation to the Wound Care Program Nursing Diagnoses: Knowledge deficit related to the wound healing center program Goals: Patient/caregiver will verbalize understanding of the [facility] Program Date Initiated: 05/29/2016 Target Resolution Date: 11/08/2016 Goal Status: Active Interventions: Provide education on orientation to the wound center Notes: ` Pain, Acute or Chronic Nursing Diagnoses: Pain, acute or chronic: actual or potential Potential  alteration in comfort, pain Goals: Patient will verbalize adequate pain control and receive pain control interventions during procedures as needed Date Initiated: 05/29/2016 Target Resolution Date: 02/07/2017 Goal Status: Active Interventions: Complete pain assessment as per visit requirements Notes: ` Wound/Skin Impairment Nursing Diagnoses: Impaired tissue integrity Knowledge deficit related to ulceration/compromised skin integrity Goals: Ulcer/skin breakdown will have a volume reduction of 80% by week 12 Date Initiated: 05/29/2016 Target Resolution Date: 02/07/2017 Goal Status: Active Interventions: Assess patient/caregiver ability to perform ulcer/skin care regimen upon admission and as needed DANTON, PALMATEER ([id number]) Assess ulceration(s) every visit Notes: Electronic Signature(s) Signed: 10/16/2016 5:25:16 PM By: Alric Quan Entered By: Alric Quan on 10/16/2016 15:54:53 [name] ([id number]) -------------------------------------------------------------------------------- Pain Assessment Details Patient Name: [name]. Date of Service: 10/16/2016 3:30 PM Medical Record Number: [id number] Patient Account Number: [id number] Date of Birth/Sex: 08-15-1934 (82 y.o. Male) Treating RN: Ahmed Prima Primary Care Avi Archuleta: [name] Other Clinician: Referring Tammi Boulier: [name] Treating Lequisha Cammack/Extender: [name] in Treatment: 20 Active Problems Location of Pain Severity and Description of Pain Patient Has Paino No Site Locations With Dressing Change: No Pain Management and Medication Current Pain Management: Electronic  Signature(s) Signed: 10/16/2016 5:25:16 PM By: Alric Quan Entered By: Alric Quan on 10/16/2016 15:40:08 [name] ([id number]) -------------------------------------------------------------------------------- Patient/Caregiver Education Details Patient Name: RAHIM, ASTORGA. Date  of Service: 10/16/2016 3:30 PM Medical Record Number: [id number] Patient Account Number: [id number] Date of Birth/Gender: [birth year] (82 y.o. Male) Treating RN: Ahmed Prima Primary Care Physician: [name] Other Clinician: Referring Physician: [name] Treating Physician/Extender: [name] in Treatment: 20 Education Assessment Education Provided To: Patient Education Topics Provided Welcome To [facility]: Handouts: Welcome To [facility] Methods: Explain/Verbal Responses: State content correctly Wound/Skin Impairment: Handouts: Other: change dressing as ordered Methods: Demonstration, Explain/Verbal Responses: State content correctly Electronic Signature(s) Signed: 10/16/2016 5:25:16 PM By: Alric Quan Entered By: Alric Quan on 10/16/2016 15:53:44 [name] ([id number]) -------------------------------------------------------------------------------- Wound Assessment Details Patient Name: [name]. Date of Service: 10/16/2016 3:30 PM Medical Record Number: [id number] Patient Account Number: [id number] Date of Birth/Sex: 06-23-1934 (82 y.o. Male) Treating RN: Ahmed Prima Primary Care Cayle Cordoba: [name] Other Clinician: Referring Ayse Mccartin: [name] Treating Mazin Emma/Extender: [name] in Treatment: 20 Wound Status Wound Number: 2 Primary Pressure Ulcer Etiology: Wound Location: Left, Lateral Malleolus Wound Open Wounding Event: Pressure Injury Status: Date Acquired: 10/10/2016 Comorbid Cataracts, Anemia, Sleep Apnea, Weeks Of Treatment: 0 History: Arrhythmia, Hypertension, Dementia Clustered Wound: No Wound Measurements Length: (cm) 0.4 Width: (cm) 0.3 Depth: (cm) 0.1 Area: (cm) 0.094 Volume: (cm) 0.009 % Reduction in Area: -203.2% % Reduction in Volume: -200% Epithelialization: None Tunneling: No Undermining: No Wound Description Classification:  Category/Stage II Wound Margin: Distinct, outline attached Exudate Amount: Large Exudate Type: Serous Exudate Color: amber Foul Odor After Cleansing: No Slough/Fibrino Yes Wound Bed Granulation Amount: Large (67-100%) Granulation Quality: Red Necrotic Amount: Small (1-33%) Necrotic Quality: Adherent Slough Periwound Skin Texture Texture Color No Abnormalities Noted: No No Abnormalities Noted: No Moisture Temperature / Pain No Abnormalities Noted: No Temperature: No Abnormality Tenderness on Palpation: Yes Wound Preparation Ulcer Cleansing: Rinsed/Irrigated with Saline Topical Anesthetic Applied: Other: lidocaine 4%, EINER, MEALS. ([id number]) Treatment Notes Wound #2 (Left, Lateral Malleolus) 1. Cleansed with: Clean wound with Normal Saline 2. Anesthetic Topical Lidocaine 4% cream to wound bed prior to debridement 3. Peri-wound Care: Skin Prep 4. Dressing Applied: Other dressing (specify in notes) 5. Secondary Dressing Applied ABD Pad Dry Gauze Kerlix/Conform 7. Secured with Tape Notes kerlix, coban, steri-strips, Affinity, mepitel Electronic Signature(s) Signed: 10/16/2016 5:25:16 PM By: Alric Quan Entered By: Alric Quan on 10/16/2016 15:57:44 [name] ([id number]) -------------------------------------------------------------------------------- Vitals Details Patient Name: DANH, BAYUS. Date of Service: 10/16/2016 3:30 PM Medical Record Number: [id number] Patient Account Number: [id number] Date of Birth/Sex: 07/14/1934 (82 y.o. Male) Treating RN: Ahmed Prima Primary Care Anisten Tomassi: [name] Other Clinician: Referring Amdrew Oboyle: [name] Treating Zyrah Wiswell/Extender: [name] in Treatment: 20 Vital Signs Time Taken: 15:40 Temperature (F): 98.2 Height (in): 69 Pulse (bpm): 56 Source: Stated Respiratory Rate (breaths/min): 18 Weight (lbs): 180 Blood Pressure (mmHg): 125/66 Source: Measured Reference  Range: 80 - 120 mg / dl Body Mass Index (BMI): 26.6 Electronic Signature(s) Signed: 10/16/2016 5:25:16 PM By: Alric Quan Entered By: Alric Quan on 10/16/2016 15:42:35

## 2016-10-18 NOTE — Progress Notes (Addendum)
MICHIAL, DISNEY ([id number]) Visit Report for 10/16/2016 Chief Complaint Document Details Patient Name: [name], [name]. Date of Service: 10/16/2016 3:30 PM Medical Record Number: [id number] Patient Account Number: [id number] Date of Birth/Sex: [birth year] (82 y.o. Male) Treating RN: Ahmed Prima Primary Care Provider: [name] Other Clinician: Referring Provider: [name] Treating Provider/Extender: [name] in Treatment: 20 Information Obtained from: Patient Chief Complaint Patient is at the clinic for treatment of an open pressure ulcer to the left ankle which she's had for about 5 weeks Electronic Signature(s) Signed: 10/16/2016 4:18:19 PM By: Christin Fudge MD, FACS Entered By: Christin Fudge on 10/16/2016 16:18:19 [name] ([id number]) -------------------------------------------------------------------------------- Cellular or Tissue Based Product Details Patient Name: [name], [name]. Date of Service: 10/16/2016 3:30 PM Medical Record Number: [id number] Patient Account Number: [id number] Date of Birth/Sex: [birth year] (82 y.o. Male) Treating RN: [name] Primary Care Provider: [name] Other Clinician: Referring Provider: [name] Treating Provider/Extender: [name] in Treatment: 20 Cellular or Tissue Based Wound #2 Left,Lateral Malleolus Product Type Applied to: Performed By: Physician Christin Fudge, MD Cellular or Tissue Based Other Product Type: Pre-procedure Yes - 16:03 Verification/Time Out Taken: Location: trunk / arms / legs Wound Size (sq cm): 0.12 Product Size (sq cm): 3 Waste Size (sq cm): 0 Amount of Product Applied (sq cm): 3 Lot #: [id number] Order #: [id number] Expiration Date: 10/23/2016 Fenestrated: No Reconstituted: No Secured: Yes Secured With: Steri-Strips Dressing Applied: Yes Primary Dressing: mepitel Procedural Pain: 0 Post Procedural Pain: 0 Response to Treatment: Procedure was  tolerated well Post Procedure Diagnosis Same as Pre-procedure Notes Donated Affinity 1.5 x 1.5 applied to patient. No charge to patient. Electronic Signature(s) Signed: 10/31/2016 8:08:56 AM By: [name], [name], RN, CWS, [name], [name] Previous Signature: 10/16/2016 4:18:11 PM Version By: Christin Fudge MD, FACS Entered By: [name] [name], RN, CWS, Kim on 10/31/2016 08:08:56 [name], [name] ([id number]) -------------------------------------------------------------------------------- HPI Details Patient Name: [name], [name]. Date of Service: 10/16/2016 3:30 PM Medical Record Number: [id number] Patient Account Number: [id number] Date of Birth/Sex: [birth year] (82 y.o. Male) Treating RN: Ahmed Prima Primary Care Provider: [name] Other Clinician: Referring Provider: [name] Treating Provider/Extender: [name] in Treatment: 20 History of Present Illness Location: left ankle laterally Quality: Patient reports experiencing a dull pain to affected area(s). Severity: Patient states wound are getting worse. Duration: Patient has had the wound for < 5 weeks prior to presenting for treatment Timing: Pain in wound is Intermittent (comes and goes Context: The wound appeared gradually over time Modifying Factors: Other treatment(s) tried include:put on Bactroban and doxycycline Associated Signs and Symptoms: Patient reports having difficulty standing for long periods. HPI Description: 82 year old gentleman who is known to have a history of dementia but is fairly functional and is able to ambulate has been having a pressure ulcer to the left ankle due to laying in bed for longer than he should. Past history of atrial fibrillation, dementia, hyperlipidemia, hypertension. He's also had some orthopedic related surgeries in the past and was a former smoker and is given up in 1969. Recently seen by his PCP who put him on doxycycline and asked him to apply Bactroban and see [place] at the  wound clinic. 06/05/2016 -- had a x-ray of the left ankle which showed no acute bony pathology. 10/16/2016 -- the patient had been recently discharged on May 24 and was doing fine. His wife noticed some drainage from the wound and hence brought him back today.  Nothing else has changed in his health. Electronic Signature(s) Signed: 10/16/2016 4:18:48 PM By: Christin Fudge MD, FACS Entered By: Christin Fudge on 10/16/2016 16:18:48 [name] ([id number]) -------------------------------------------------------------------------------- Physical Exam Details Patient Name: [name], [name]. Date of Service: 10/16/2016 3:30 PM Medical Record Number: [id number] Patient Account Number: [id number] Date of Birth/Sex: 09/21/1934 (82 y.o. Male) Treating RN: Ahmed Prima Primary Care Provider: [name] Other Clinician: Referring Provider: [name] Treating Provider/Extender: [name] in Treatment: 20 Constitutional . Pulse regular. Respirations normal and unlabored. Afebrile. . Eyes Nonicteric. Reactive to light. Ears, Nose, Mouth, and Throat Lips, teeth, and gums WNL.Marland Kitchen Moist mucosa without lesions. Neck supple and nontender. No palpable supraclavicular or cervical adenopathy. Normal sized without goiter. Respiratory WNL. No retractions.. Breath sounds WNL, No rubs, rales, rhonchi, or wheeze.. Cardiovascular Heart rhythm and rate regular, no murmur or gallop.. Pedal Pulses WNL. No clubbing, cyanosis or edema. Chest Breasts symmetical and no nipple discharge.. Breast tissue WNL, no masses, lumps, or tenderness.. Lymphatic No adneopathy. No adenopathy. No adenopathy. Musculoskeletal Adexa without tenderness or enlargement.. Digits and nails w/o clubbing, cyanosis, infection, petechiae, ischemia, or inflammatory conditions.. Integumentary (Hair, Skin) No suspicious lesions. No crepitus or fluctuance. No peri-wound warmth or erythema. No  masses.Marland Kitchen Psychiatric Judgement and insight Intact.. No evidence of depression, anxiety, or agitation.. Notes the wound on the left lateral ankle seems to have healthy granulation tissue and there is minimal debris which I sharply removed and there was no evidence of cellulitis. There was a donated sample of Affinity 1.5 cm x 1.5 cm and I have applied this with the usual precautions and bolstered in place. Electronic Signature(s) Signed: 10/16/2016 4:19:36 PM By: Christin Fudge MD, FACS Entered By: Christin Fudge on 10/16/2016 16:19:35 [name] ([id number]) -------------------------------------------------------------------------------- Physician Orders Details Patient Name: [name], [name]. Date of Service: 10/16/2016 3:30 PM Medical Record Number: [id number] Patient Account Number: [id number] Date of Birth/Sex: February 14, 1935 (82 y.o. Male) Treating RN: Ahmed Prima Primary Care Provider: [name] Other Clinician: Referring Provider: [name] Treating Provider/Extender: [name] in Treatment: 20 Verbal / Phone Orders: Yes Clinician: Carolyne Fiscal, Debi Read Back and Verified: Yes Diagnosis Coding Wound Cleansing Wound #2 Left,Lateral Malleolus o Clean wound with Normal Saline. Anesthetic Wound #2 Left,Lateral Malleolus o Topical Lidocaine 4% cream applied to wound bed prior to debridement Primary Wound Dressing Wound #2 Left,Lateral Malleolus o Other: - Donated Affinity 1.5 x 1.5 applied. Secondary Dressing Wound #2 Left,Lateral Malleolus o ABD pad o Dry Gauze o Conform/Kerlix o Mepitel One - Secured with steri-strips, mepitel Dressing Change Frequency Wound #2 Left,Lateral Malleolus o Change dressing every week Follow-up Appointments Wound #2 Left,Lateral Malleolus o Return Appointment in 1 week. Edema Control Wound #2 Left,Lateral Malleolus o Elevate legs to the level of the heart and pump ankles as often as  possible Electronic Signature(s) Signed: 10/16/2016 4:29:10 PM By: Christin Fudge MD, FACS Signed: 10/16/2016 5:25:16 PM By: Larena Sox ([id number]) Previous Signature: 10/16/2016 4:29:00 PM Version By: Christin Fudge MD, FACS Entered By: Alric Quan on 10/16/2016 16:28:58 [name], [name] ([id number]) -------------------------------------------------------------------------------- Problem List Details Patient Name: [name], [name]. Date of Service: 10/16/2016 3:30 PM Medical Record Number: [id number] Patient Account Number: [id number] Date of Birth/Sex: [birth year] (82 y.o. Male) Treating RN: Ahmed Prima Primary Care Provider: [name] Other Clinician: Referring Provider: [name] Treating Provider/Extender: [name] in Treatment: 20 Active Problems ICD-10 Encounter Code Description Active Date Diagnosis [phone] Pressure ulcer  of left ankle, stage 2 05/29/2016 Yes F02.80 Dementia in other diseases classified elsewhere without 05/29/2016 Yes behavioral disturbance Inactive Problems Resolved Problems Electronic Signature(s) Signed: 10/16/2016 4:17:44 PM By: Christin Fudge MD, FACS Entered By: Christin Fudge on 10/16/2016 16:17:43 [name] ([id number]) -------------------------------------------------------------------------------- Progress Note Details Patient Name: [name], [name]. Date of Service: 10/16/2016 3:30 PM Medical Record Number: [id number] Patient Account Number: [id number] Date of Birth/Sex: Dec 31, 1934 (82 y.o. Male) Treating RN: Ahmed Prima Primary Care Provider: [name] Other Clinician: Referring Provider: [name] Treating Provider/Extender: [name] in Treatment: 20 Subjective Chief Complaint Information obtained from Patient Patient is at the clinic for treatment of an open pressure ulcer to the left ankle which she's had for about 5 weeks History of Present Illness  (HPI) The following HPI elements were documented for the patient's wound: Location: left ankle laterally Quality: Patient reports experiencing a dull pain to affected area(s). Severity: Patient states wound are getting worse. Duration: Patient has had the wound for < 5 weeks prior to presenting for treatment Timing: Pain in wound is Intermittent (comes and goes Context: The wound appeared gradually over time Modifying Factors: Other treatment(s) tried include:put on Bactroban and doxycycline Associated Signs and Symptoms: Patient reports having difficulty standing for long periods. 82 year old gentleman who is known to have a history of dementia but is fairly functional and is able to ambulate has been having a pressure ulcer to the left ankle due to laying in bed for longer than he should. Past history of atrial fibrillation, dementia, hyperlipidemia, hypertension. He's also had some orthopedic related surgeries in the past and was a former smoker and is given up in 1969. Recently seen by his PCP who put him on doxycycline and asked him to apply Bactroban and see [place] at the wound clinic. 06/05/2016 -- had a x-ray of the left ankle which showed no acute bony pathology. 10/16/2016 -- the patient had been recently discharged on May 24 and was doing fine. His wife noticed some drainage from the wound and hence brought him back today. Nothing else has changed in his health. Objective Constitutional Pulse regular. Respirations normal and unlabored. Afebrile. Vitals Time Taken: 3:40 PM, Height: 69 in, Source: Stated, Weight: 180 lbs, Source: Measured, BMI: 26.6, [name], Jared M. ([id number]) Temperature: 98.2 F, Pulse: 56 bpm, Respiratory Rate: 18 breaths/min, Blood Pressure: 125/66 mmHg. Eyes Nonicteric. Reactive to light. Ears, Nose, Mouth, and Throat Lips, teeth, and gums WNL.Marland Kitchen Moist mucosa without lesions. Neck supple and nontender. No palpable supraclavicular or cervical adenopathy.  Normal sized without goiter. Respiratory WNL. No retractions.. Breath sounds WNL, No rubs, rales, rhonchi, or wheeze.. Cardiovascular Heart rhythm and rate regular, no murmur or gallop.. Pedal Pulses WNL. No clubbing, cyanosis or edema. Chest Breasts symmetical and no nipple discharge.. Breast tissue WNL, no masses, lumps, or tenderness.. Lymphatic No adneopathy. No adenopathy. No adenopathy. Musculoskeletal Adexa without tenderness or enlargement.. Digits and nails w/o clubbing, cyanosis, infection, petechiae, ischemia, or inflammatory conditions.Marland Kitchen Psychiatric Judgement and insight Intact.. No evidence of depression, anxiety, or agitation.. General Notes: the wound on the left lateral ankle seems to have healthy granulation tissue and there is minimal debris which I sharply removed and there was no evidence of cellulitis. There was a donated sample of Affinity 1.5 cm x 1.5 cm and I have applied this with the usual precautions and bolstered in place. Integumentary (Hair, Skin) No suspicious lesions. No crepitus or fluctuance. No peri-wound warmth or erythema. No masses.. Wound #2 status  is Open. Original cause of wound was Pressure Injury. The wound is located on the Left,Lateral Malleolus. The wound measures 0.4cm length x 0.3cm width x 0.1cm depth; 0.094cm^2 area and 0.009cm^3 volume. There is no tunneling or undermining noted. There is a large amount of serous drainage noted. The wound margin is distinct with the outline attached to the wound base. There is large (67-100%) red granulation within the wound bed. There is a small (1-33%) amount of necrotic tissue within the wound bed including Adherent Slough. Periwound temperature was noted as No Abnormality. The periwound has tenderness on palpation. Assessment [name], [name] ([id number]) Active Problems ICD-10 [phone] - Pressure ulcer of left ankle, stage 2 F02.80 - Dementia in other diseases classified elsewhere without  behavioral disturbance Having reviewed the wound and having healthy granulation tissue, I was able to apply a donated sample of Affinity skin substitute, and this has been bolstered in place. He will leave the dressing intact for a week and next week when he comes the dressing will be taken down and we will use Sorbact with hydrogel and re- bolster it and leave it on for another week. The patient and his wife understand the treatment plan. Procedures Wound #2 Pre-procedure diagnosis of Wound #2 is a Pressure Ulcer located on the Left,Lateral Malleolus. A skin graft procedure using a bioengineered skin substitute/cellular or tissue based product was performed by Christin Fudge, MD. Other was applied and secured with Steri-Strips. 3 sq cm of product was utilized and 0 sq cm was wasted. Post Application, mepitel was applied. A Time Out was conducted at 16:03, prior to the start of the procedure. The procedure was tolerated well with a pain level of 0 throughout and a pain level of 0 following the procedure. Post procedure Diagnosis Wound #2: Same as Pre-Procedure General Notes: Donated Affinity 1.5 x 1.5 applied to patient. No charge to patient. Plan Wound Cleansing: Wound #2 Left,Lateral Malleolus: Clean wound with Normal Saline. Anesthetic: Wound #2 Left,Lateral Malleolus: Topical Lidocaine 4% cream applied to wound bed prior to debridement Primary Wound Dressing: Wound #2 Left,Lateral Malleolus: Other: - Donated Affinity 1.5 x 1.5 applied. Secondary Dressing: Wound #2 Left,Lateral Malleolus: ABD pad [name], [name] ([id number]) Dry Gauze Conform/Kerlix Mepitel One - Secured with steri-strips, mepitel Dressing Change Frequency: Wound #2 Left,Lateral Malleolus: Change dressing every week Follow-up Appointments: Wound #2 Left,Lateral Malleolus: Return Appointment in 1 week. Edema Control: Wound #2 Left,Lateral Malleolus: Elevate legs to the level of the heart and pump ankles as  often as possible Having reviewed the wound and having healthy granulation tissue, I was able to apply a donated sample of Affinity skin substitute, and this has been bolstered in place. He will leave the dressing intact for a week and next week when he comes the dressing will be taken down and we will use Sorbact with hydrogel and re- bolster it and leave it on for another week. The patient and his wife understand the treatment plan. Electronic Signature(s) Signed: 10/31/2016 8:09:08 AM By: [name], [name], RN, CWS, [name], [name] Signed: 11/03/2016 7:58:58 AM By: Christin Fudge MD, FACS Previous Signature: 10/16/2016 4:32:46 PM Version By: Christin Fudge MD, FACS Previous Signature: 10/16/2016 4:21:31 PM Version By: Christin Fudge MD, FACS Entered By: [name] [name], RN, CWS, Kim on 10/31/2016 08:09:08 BECKEM, TOMBERLIN ([id number]) -------------------------------------------------------------------------------- SuperBill Details Patient Name: REUBIN, BUSHNELL. Date of Service: 10/16/2016 Medical Record Number: [id number] Patient Account Number: [id number] Date of Birth/Sex: Dec 10, 1934 (82 y.o. Male) Treating RN: Carolyne Fiscal, Debi Primary  Care Provider: [name] Other Clinician: Referring Provider: [name] Treating Provider/Extender: [name] in Treatment: 20 Diagnosis Coding ICD-10 Codes Code Description [phone] Pressure ulcer of left ankle, stage 2 F02.80 Dementia in other diseases classified elsewhere without behavioral disturbance Facility Procedures CPT4 Code: [id number] Description: [phone] - WOUND CARE VISIT-LEV 2 EST PT Modifier: Quantity: 1 Physician Procedures CPT4: Description Modifier Quantity Code [phone] [id number] - WC PHYS SKIN SUB GRAFT TRNK/ARM/LEG 1 ICD-10 Description Diagnosis L89.522 Pressure ulcer of left ankle, stage 2 F02.80 Dementia in other diseases classified elsewhere without behavioral  disturbance Notes Donated Affinity 1.5 x 1.5. No charge for product  to patient. Electronic Signature(s) Signed: 10/17/2016 8:47:13 AM By: [name], [name], RN, CWS, [name], [name] Signed: 10/17/2016 4:13:16 PM By: Christin Fudge MD, FACS Previous Signature: 10/16/2016 4:24:51 PM Version By: Christin Fudge MD, FACS Entered By: [name] [name], RN, CWS, Kim on 10/17/2016 08:47:13

## 2016-10-23 ENCOUNTER — Encounter: Payer: PPO | Admitting: Physician Assistant

## 2016-10-23 ENCOUNTER — Ambulatory Visit: Payer: PPO | Admitting: Surgery

## 2016-10-23 DIAGNOSIS — L89529 Pressure ulcer of left ankle, unspecified stage: Secondary | ICD-10-CM | POA: Diagnosis not present

## 2016-10-23 DIAGNOSIS — L89522 Pressure ulcer of left ankle, stage 2: Secondary | ICD-10-CM | POA: Diagnosis not present

## 2016-10-25 NOTE — Progress Notes (Signed)
JAMAAR, HOWES ([id number]) Visit Report for 10/23/2016 Chief Complaint Document Details Patient Name: [name], [name]. Date of Service: 10/23/2016 3:30 PM Medical Record Number: [id number] Patient Account Number: [id number] Date of Birth/Sex: [birth year] (82 y.o. Male) Treating RN: Ahmed Prima Primary Care Provider: [name] Other Clinician: Referring Provider: [name] Treating Provider/Extender: Melburn Hake, Monaye Blackie Weeks in Treatment: 21 Information Obtained from: Patient Chief Complaint Patient is at the clinic for treatment of an open pressure ulcer to the left ankle Electronic Signature(s) Signed: 10/23/2016 5:49:59 PM By: Worthy Keeler PA-C Entered By: Worthy Keeler on 10/23/2016 17:06:35 [name] ([id number]) -------------------------------------------------------------------------------- HPI Details Patient Name: [name], [name]. Date of Service: 10/23/2016 3:30 PM Medical Record Number: [id number] Patient Account Number: [id number] Date of Birth/Sex: 06/25/1934 (82 y.o. Male) Treating RN: Ahmed Prima Primary Care Provider: [name] Other Clinician: Referring Provider: [name] Treating Provider/Extender: Melburn Hake, Lisa-Marie Rueger Weeks in Treatment: 21 History of Present Illness Location: left ankle laterally Quality: Patient reports experiencing a dull pain to affected area(s). Duration: Patient has had the wound for < 5 weeks prior to presenting for treatment Timing: Pain in wound is Intermittent (comes and goes Context: The wound appeared gradually over time Modifying Factors: Other treatment(s) tried include:put on Bactroban and doxycycline Associated Signs and Symptoms: Patient reports having difficulty standing for long periods. HPI Description: 82 year old gentleman who is known to have a history of dementia but is fairly functional and is able to ambulate has been having a pressure ulcer to the left ankle due to laying in bed  for longer than he should. Past history of atrial fibrillation, dementia, hyperlipidemia, hypertension. He's also had some orthopedic related surgeries in the past and was a former smoker and is given up in 1969. Recently seen by his PCP who put him on doxycycline and asked him to apply Bactroban and see [place] at the wound clinic. 06/05/2016 -- had a x-ray of the left ankle which showed no acute bony pathology. 10/16/2016 -- the patient had been recently discharged on May 24 and was doing fine. His wife noticed some drainage from the wound and hence brought him back today. Nothing else has changed in his health. 10/23/16 patient's wound appears to be doing even better on this week's evaluation in my opinion. There is just a very small opening and minimal discharge noted. He is pleased with how things have been progressing and he tells me that he tries to where the cushioned boots when he sleeps at night but that it's not the most comfortable and often he feels like the only way to get comfortable is to sleep without it. Obviously I think this can be bad for the wound as there is pressure then applied to the lateral malleolus. Electronic Signature(s) Signed: 10/23/2016 5:49:59 PM By: Worthy Keeler PA-C Entered By: Worthy Keeler on 10/23/2016 17:08:24 KIYAAN, HAQ ([id number]) -------------------------------------------------------------------------------- Physical Exam Details Patient Name: [name], [name]. Date of Service: 10/23/2016 3:30 PM Medical Record Number: [id number] Patient Account Number: [id number] Date of Birth/Sex: 1934-10-20 (82 y.o. Male) Treating RN: Ahmed Prima Primary Care Provider: [name] Other Clinician: Referring Provider: [name] Treating Provider/Extender: Melburn Hake, Ethin Drummond Weeks in Treatment: 21 Constitutional Well-nourished and well-hydrated in no acute distress. Respiratory normal breathing without difficulty. clear to auscultation  bilaterally. Psychiatric this patient is able to make decisions and demonstrates good insight into disease process. Alert and Oriented x 3. pleasant and cooperative. Notes There does  not appear to be any significant drainage on evaluation today. There definitely is no syndicate slough and the wound appears to be almost closed with only a small opening centrally there were .1 cm length versus width. Electronic Signature(s) Signed: 10/23/2016 5:49:59 PM By: Worthy Keeler PA-C Entered By: Worthy Keeler on 10/23/2016 17:09:29 [name] ([id number]) -------------------------------------------------------------------------------- Physician Orders Details Patient Name: [name], [name]. Date of Service: 10/23/2016 3:30 PM Medical Record Number: [id number] Patient Account Number: [id number] Date of Birth/Sex: 1934-08-28 (82 y.o. Male) Treating RN: Ahmed Prima Primary Care Provider: [name] Other Clinician: Referring Provider: [name] Treating Provider/Extender: Melburn Hake, Aalani Aikens Weeks in Treatment: 21 Verbal / Phone Orders: Yes ClinicianCarolyne Fiscal, Debi Read Back and Verified: Yes Diagnosis Coding Wound Cleansing Wound #2 Left,Lateral Malleolus o Clean wound with Normal Saline. o Cleanse wound with mild soap and water o May Shower, gently pat wound dry prior to applying new dressing. Anesthetic Wound #2 Left,Lateral Malleolus o Topical Lidocaine 4% cream applied to wound bed prior to debridement Skin Barriers/Peri-Wound Care Wound #2 Left,Lateral Malleolus o Skin Prep Primary Wound Dressing Wound #2 Left,Lateral Malleolus o Hydrogel o Other: - Sorbact Secondary Dressing Wound #2 Left,Lateral Malleolus o Dry Gauze o Boardered Foam Dressing Dressing Change Frequency Wound #2 Left,Lateral Malleolus o Change dressing every other day. Follow-up Appointments Wound #2 Left,Lateral Malleolus o Return Appointment in 1 week. Edema  Control Wound #2 Left,Lateral Malleolus o Elevate legs to the level of the heart and pump ankles as often as possible [name], [name]. ([id number]) Additional Orders / Instructions Wound #2 Left,Lateral Malleolus o Increase protein intake. Notes We are going to reinitiate the Sorbact dressing with a covering Boarder Foam Dressing as this seems to have done well for him in the past. We will see how things do over the next week although I think he is getting to the point of this improving and fully healing although it's gonna be important as I discussed in great detail with him today to keep pressure off of this area. He voiced understanding and his wife was present for this conversation as well. Electronic Signature(s) Signed: 10/23/2016 5:49:59 PM By: Worthy Keeler PA-C Previous Signature: 10/23/2016 5:00:36 PM Version By: Alric Quan Entered By: Worthy Keeler on 10/23/2016 17:11:04 [name] ([id number]) -------------------------------------------------------------------------------- Progress Note Details Patient Name: [name], [name]. Date of Service: 10/23/2016 3:30 PM Medical Record Number: [id number] Patient Account Number: [id number] Date of Birth/Sex: 1935/01/28 (82 y.o. Male) Treating RN: Ahmed Prima Primary Care Provider: [name] Other Clinician: Referring Provider: [name] Treating Provider/Extender: Melburn Hake, Linard Daft Weeks in Treatment: 21 Subjective Chief Complaint Information obtained from Patient Patient is at the clinic for treatment of an open pressure ulcer to the left ankle History of Present Illness (HPI) The following HPI elements were documented for the patient's wound: Location: left ankle laterally Quality: Patient reports experiencing a dull pain to affected area(s). Duration: Patient has had the wound for < 5 weeks prior to presenting for treatment Timing: Pain in wound is Intermittent (comes and goes Context: The  wound appeared gradually over time Modifying Factors: Other treatment(s) tried include:put on Bactroban and doxycycline Associated Signs and Symptoms: Patient reports having difficulty standing for long periods. 82 year old gentleman who is known to have a history of dementia but is fairly functional and is able to ambulate has been having a pressure ulcer to the left ankle due to laying in bed for longer than he should.  Past history of atrial fibrillation, dementia, hyperlipidemia, hypertension. He's also had some orthopedic related surgeries in the past and was a former smoker and is given up in 1969. Recently seen by his PCP who put him on doxycycline and asked him to apply Bactroban and see [place] at the wound clinic. 06/05/2016 -- had a x-ray of the left ankle which showed no acute bony pathology. 10/16/2016 -- the patient had been recently discharged on May 24 and was doing fine. His wife noticed some drainage from the wound and hence brought him back today. Nothing else has changed in his health. 10/23/16 patient's wound appears to be doing even better on this week's evaluation in my opinion. There is just a very small opening and minimal discharge noted. He is pleased with how things have been progressing and he tells me that he tries to where the cushioned boots when he sleeps at night but that it's not the most comfortable and often he feels like the only way to get comfortable is to sleep without it. Obviously I think this can be bad for the wound as there is pressure then applied to the lateral malleolus. Objective [name], [name]. ([id number]) Constitutional Well-nourished and well-hydrated in no acute distress. Vitals Time Taken: 3:27 PM, Height: 69 in, Weight: 180 lbs, BMI: 26.6, Pulse: 54 bpm, Respiratory Rate: 18 breaths/min, Blood Pressure: 116/52 mmHg. Respiratory normal breathing without difficulty. clear to auscultation bilaterally. Psychiatric this patient is able to make  decisions and demonstrates good insight into disease process. Alert and Oriented x 3. pleasant and cooperative. General Notes: There does not appear to be any significant drainage on evaluation today. There definitely is no syndicate slough and the wound appears to be almost closed with only a small opening centrally there were .1 cm length versus width. Integumentary (Hair, Skin) Wound #2 status is Open. Original cause of wound was Pressure Injury. The wound is located on the Left,Lateral Malleolus. The wound measures 0.3cm length x 0.3cm width x 0.1cm depth; 0.071cm^2 area and 0.007cm^3 volume. There is no tunneling or undermining noted. There is a large amount of serous drainage noted. The wound margin is distinct with the outline attached to the wound base. There is large (67-100%) red granulation within the wound bed. There is a small (1-33%) amount of necrotic tissue within the wound bed including Adherent Slough. Periwound temperature was noted as No Abnormality. The periwound has tenderness on palpation. Plan Wound Cleansing: Wound #2 Left,Lateral Malleolus: Clean wound with Normal Saline. Cleanse wound with mild soap and water May Shower, gently pat wound dry prior to applying new dressing. Anesthetic: Wound #2 Left,Lateral Malleolus: Topical Lidocaine 4% cream applied to wound bed prior to debridement Skin Barriers/Peri-Wound Care: Wound #2 Left,Lateral Malleolus: Skin Prep Primary Wound Dressing: Wound #2 Left,Lateral Malleolus: Hydrogel Other: - [place] ([id number]) Secondary Dressing: Wound #2 Left,Lateral Malleolus: Dry Gauze Boardered Foam Dressing Dressing Change Frequency: Wound #2 Left,Lateral Malleolus: Change dressing every other day. Follow-up Appointments: Wound #2 Left,Lateral Malleolus: Return Appointment in 1 week. Edema Control: Wound #2 Left,Lateral Malleolus: Elevate legs to the level of the heart and pump ankles as often as  possible Additional Orders / Instructions: Wound #2 Left,Lateral Malleolus: Increase protein intake. General Notes: We are going to reinitiate the Sorbact dressing with a covering Boarder Foam Dressing as this seems to have done well for him in the past. We will see how things do over the next week although I think he is getting to the  point of this improving and fully healing although it's gonna be important as I discussed in great detail with him today to keep pressure off of this area. He voiced understanding and his wife was present for this conversation as well. Electronic Signature(s) Signed: 10/23/2016 5:49:59 PM By: Worthy Keeler PA-C Entered By: Worthy Keeler on 10/23/2016 17:11:14 [name] ([id number]) -------------------------------------------------------------------------------- SuperBill Details Patient Name: [name], [name]. Date of Service: 10/23/2016 Medical Record Number: [id number] Patient Account Number: [id number] Date of Birth/Sex: 12-26-1934 (82 y.o. Male) Treating RN: Ahmed Prima Primary Care Provider: [name] Other Clinician: Referring Provider: [name] Treating Provider/Extender: Melburn Hake, Sloka Volante Weeks in Treatment: 21 Diagnosis Coding ICD-10 Codes Code Description [phone] Pressure ulcer of left ankle, stage 2 F02.80 Dementia in other diseases classified elsewhere without behavioral disturbance Facility Procedures CPT4 Code: [id number] Description: 99213 - WOUND CARE VISIT-LEV 3 EST PT Modifier: Quantity: 1 Physician Procedures CPT4: Description Modifier Quantity Code [id number] 99213 - WC PHYS LEVEL 3 - EST PT 1 ICD-10 Description Diagnosis L89.522 Pressure ulcer of left ankle, stage 2 F02.80 Dementia in other diseases classified elsewhere without behavioral disturbance Electronic Signature(s) Signed: 10/23/2016 5:49:59 PM By: Worthy Keeler PA-C Previous Signature: 10/23/2016 5:00:36 PM Version By: Alric Quan Entered By:  Worthy Keeler on 10/23/2016 17:11:31

## 2016-10-27 NOTE — Progress Notes (Signed)
KHARON, HIXON ([id number]) Visit Report for 10/23/2016 Arrival Information Details Patient Name: [name], [name]. Date of Service: 10/23/2016 3:30 PM Medical Record Number: [id number] Patient Account Number: [id number] Date of Birth/Sex: [birth year] (82 y.o. Male) Treating RN: Ahmed Prima Primary Care Landynn Dupler: [name] Other Clinician: Referring Jakori Burkett: [name] Treating Indalecio Malmstrom/Extender: Melburn Hake, HOYT Weeks in Treatment: 21 Visit Information History Since Last Visit All ordered tests and consults were completed: No Patient Arrived: [name] Added or deleted any medications: No Arrival Time: 15:26 Any new allergies or adverse reactions: No Accompanied By: wife Had a fall or experienced change in No Transfer Assistance: EasyPivot activities of daily living that may affect Patient Lift risk of falls: Patient Identification Verified: Yes Signs or symptoms of abuse/neglect since last No Secondary Verification Process Yes visito Completed: Hospitalized since last visit: No Patient Requires Transmission- No Has Dressing in Place as Prescribed: Yes Based Precautions: Pain Present Now: No Patient Has Alerts: Yes Patient Alerts: ASA Electronic Signature(s) Signed: 10/23/2016 5:00:36 PM By: Alric Quan Entered By: Alric Quan on 10/23/2016 15:26:52 [name] ([id number]) -------------------------------------------------------------------------------- Clinic Level of Care Assessment Details Patient Name: [name]. Date of Service: 10/23/2016 3:30 PM Medical Record Number: [id number] Patient Account Number: [id number] Date of Birth/Sex: Oct 27, 1934 (82 y.o. Male) Treating RN: Ahmed Prima Primary Care Irean Kendricks: [name] Other Clinician: Referring Fermina Mishkin: [name] Treating Mate Alegria/Extender: Melburn Hake, HOYT Weeks in Treatment: 21 Clinic Level of Care Assessment Items TOOL 4 Quantity Score X - Use when only an EandM  is performed on FOLLOW-UP visit 1 0 ASSESSMENTS - Nursing Assessment / Reassessment X - Reassessment of Co-morbidities (includes updates in patient status) 1 10 X - Reassessment of Adherence to Treatment Plan 1 5 ASSESSMENTS - Wound and Skin Assessment / Reassessment X - Simple Wound Assessment / Reassessment - one wound 1 5 _0  - Complex Wound Assessment / Reassessment - multiple wounds 0 _1  - Dermatologic / Skin Assessment (not related to wound area) 0 ASSESSMENTS - Focused Assessment _2  - Circumferential Edema Measurements - multi extremities 0 _3  - Nutritional Assessment / Counseling / Intervention 0 _4  - Lower Extremity Assessment (monofilament, tuning fork, pulses) 0 _5  - Peripheral Arterial Disease Assessment (using hand held doppler) 0 ASSESSMENTS - Ostomy and/or Continence Assessment and Care _6  - Incontinence Assessment and Management 0 _7  - Ostomy Care Assessment and Management (repouching, etc.) 0 PROCESS - Coordination of Care X - Simple Patient / Family Education for ongoing care 1 15 _8  - Complex (extensive) Patient / Family Education for ongoing care 0 _9  - Staff obtains Programmer, systems, Records, Test Results / Process Orders 0 _10  - Staff telephones HHA, Nursing Homes / Clarify orders / etc 0 _11  - Routine Transfer to another Facility (non-emergent condition) 0 [name], [name]. ([id number]) _12  - Routine Hospital Admission (non-emergent condition) 0 _13  - New Admissions / Biomedical engineer / Ordering NPWT, Apligraf, etc. 0 _14  - Emergency Hospital Admission (emergent condition) 0 X - Simple Discharge Coordination 1 10 _15  - Complex (extensive) Discharge Coordination 0 PROCESS - Special Needs _16  - Pediatric / Minor Patient Management 0 _17  - Isolation Patient Management 0 _18  - Hearing / Language / Visual special needs 0 _19  - Assessment of Community assistance (transportation, D/C planning, etc.) 0 _20  - Additional assistance / Altered mentation 0 _21  - Support Surface(s)  Assessment (bed, cushion, seat, etc.) 0 INTERVENTIONS - Wound Cleansing / Measurement X - Simple Wound Cleansing - one wound 1 5 _22  - Complex  Wound Cleansing - multiple wounds 0 X - Wound Imaging (photographs - any number of wounds) 1 5 _0  - Wound Tracing (instead of photographs) 0 X - Simple Wound Measurement - one wound 1 5 _1  - Complex Wound Measurement - multiple wounds 0 INTERVENTIONS - Wound Dressings X - Small Wound Dressing one or multiple wounds 1 10 _2  - Medium Wound Dressing one or multiple wounds 0 _3  - Large Wound Dressing one or multiple wounds 0 X - Application of Medications - topical 1 5 <[id number]>_9<\/[id number]>_1  - Application of Medications - injection 0 INTERVENTIONS - Miscellaneous _5  - External ear exam 0 [name], [name]. ([id number]) _6  - Specimen Collection (cultures, biopsies, blood, body fluids, etc.) 0 _7  - Specimen(s) / Culture(s) sent or taken to Lab for analysis 0 _8  - Patient Transfer (multiple staff / [name] / Similar devices) 0 _9  - Simple Staple / Suture removal (25 or less) 0 _10  - Complex Staple / Suture removal (26 or more) 0 _11  - Hypo / Hyperglycemic Management (close monitor of Blood Glucose) 0 _12  - Ankle / Brachial Index (ABI) - do not check if billed separately 0 X - Vital Signs 1 5 Has the patient been seen at the hospital within the last three years: Yes Total Score: 80 Level Of Care: New/Established - Level 3 Electronic Signature(s) Signed: 10/23/2016 5:00:36 PM By: Alric Quan Entered By: Alric Quan on 10/23/2016 16:19:46 [name] ([id number]) -------------------------------------------------------------------------------- Encounter Discharge Information Details Patient Name: [name], [name]. Date of Service: 10/23/2016 3:30 PM Medical Record Number: [id number] Patient Account Number: [id number] Date of Birth/Sex: Dec 11, 1934 (82 y.o. Male) Treating RN: Ahmed Prima Primary Care Raia Amico: [name] Other Clinician: Referring  Markeria Goetsch: [name] Treating Kaylanni Ezelle/Extender: Melburn Hake, HOYT Weeks in Treatment: 21 Encounter Discharge Information Items Discharge Pain Level: 0 Discharge Condition: Stable Ambulatory Status: Cane Discharge Destination: Home Transportation: Private Auto Accompanied By: wife Schedule Follow-up Appointment: Yes Medication Reconciliation completed No and provided to Patient/Care Son Barkan: Provided on Clinical Summary of Care: 10/23/2016 Form Type Recipient Paper Patient ET Electronic Signature(s) Signed: 10/23/2016 5:00:36 PM By: Alric Quan Previous Signature: 10/23/2016 4:02:26 PM Version By: Ruthine Dose Entered By: Alric Quan on 10/23/2016 16:21:01 [name] ([id number]) -------------------------------------------------------------------------------- Lower Extremity Assessment Details Patient Name: [name], [name]. Date of Service: 10/23/2016 3:30 PM Medical Record Number: [id number] Patient Account Number: [id number] Date of Birth/Sex: 1934/10/20 (82 y.o. Male) Treating RN: Ahmed Prima Primary Care Leona Pressly: [name] Other Clinician: Referring Misti Towle: [name] Treating Bowen Kia/Extender: Melburn Hake, HOYT Weeks in Treatment: 21 Vascular Assessment Pulses: Dorsalis Pedis Palpable: [Left:Yes] Posterior Tibial Extremity colors, hair growth, and conditions: Extremity Color: [Left:Normal] Temperature of Extremity: [Left:Warm] Capillary Refill: [Left:< 3 seconds] Electronic Signature(s) Signed: 10/23/2016 5:00:36 PM By: Alric Quan Entered By: Alric Quan on 10/23/2016 15:33:42 Ho, Jared Au ([id number]) -------------------------------------------------------------------------------- Multi Wound Chart Details Patient Name: [name]. Date of Service: 10/23/2016 3:30 PM Medical Record Number: [id number] Patient Account Number: [id number] Date of Birth/Sex: December 08, 1934 (82 y.o. Male) Treating RN: Ahmed Prima Primary Care Jamiee Milholland: [name] Other Clinician: Referring Ezmae Speers: [name] Treating English Craighead/Extender: Melburn Hake, HOYT Weeks in Treatment: 21 Vital Signs Height(in): 69 Pulse(bpm): 54 Weight(lbs): 180 Blood Pressure 116/52 (mmHg): Body Mass Index(BMI): 27 Temperature(F): Respiratory Rate 18 (breaths/min): Photos: [2:No Photos] [N/A:N/A] Wound Location: [2:Left Malleolus - Lateral] [N/A:N/A] Wounding Event: [2:Pressure Injury] [N/A:N/A] Primary Etiology: [2:Pressure Ulcer] [N/A:N/A] Comorbid History: [2:Cataracts, Anemia, Sleep Apnea, Arrhythmia, Hypertension, Dementia] [N/A:N/A] Date Acquired: [2:10/10/2016] [N/A:N/A]  Weeks of Treatment: [2:1] [N/A:N/A] Wound Status: [2:Open] [N/A:N/A] Measurements L x W x D 0.3x0.3x0.1 [N/A:N/A] (cm) Area (cm) : [2:0.071] [N/A:N/A] Volume (cm) : [2:0.007] [N/A:N/A] % Reduction in Area: [2:24.50%] [N/A:N/A] % Reduction in Volume: 22.20% [N/A:N/A] Classification: [2:Category/Stage II] [N/A:N/A] Exudate Amount: [2:Large] [N/A:N/A] Exudate Type: [2:Serous] [N/A:N/A] Exudate Color: [2:amber] [N/A:N/A] Wound Margin: [2:Distinct, outline attached] [N/A:N/A] Granulation Amount: [2:Large (67-100%)] [N/A:N/A] Granulation Quality: [2:Red] [N/A:N/A] Necrotic Amount: [2:Small (1-33%)] [N/A:N/A] Epithelialization: [2:None] [N/A:N/A] Periwound Skin Texture: No Abnormalities Noted [N/A:N/A] Periwound Skin [2:No Abnormalities Noted] [N/A:N/A] Moisture: Periwound Skin Color: No Abnormalities Noted [N/A:N/A] Temperature: No Abnormality N/A N/A Tenderness on Yes N/A N/A Palpation: Wound Preparation: Ulcer Cleansing: N/A N/A Rinsed/Irrigated with Saline Topical Anesthetic Applied: Other: lidocaine 4% Treatment Notes Electronic Signature(s) Signed: 10/23/2016 5:00:36 PM By: Alric Quan Entered By: Alric Quan on 10/23/2016 15:54:32 [name], [name]  ([id number]) -------------------------------------------------------------------------------- [place] Details Patient Name: [name], [name]. Date of Service: 10/23/2016 3:30 PM Medical Record Number: [id number] Patient Account Number: [id number] Date of Birth/Sex: 02-Oct-1934 (82 y.o. Male) Treating RN: Ahmed Prima Primary Care Ubaldo Daywalt: [name] Other Clinician: Referring Brek Reece: [name] Treating Evonne Rinks/Extender: Melburn Hake, HOYT Weeks in Treatment: 21 Active Inactive ` Abuse / Safety / Falls / Self Care Management Nursing Diagnoses: Potential for falls Goals: Patient will not experience any injury related to falls Date Initiated: 10/16/2016 Target Resolution Date: 01/10/2017 Goal Status: Active Patient will remain injury free Date Target Resolution Date Initiated: 05/29/2016 Inactivated: 09/25/2016 Date: 08/02/2016 Goal Status: Met Interventions: Assess fall risk on admission and as needed Assess impairment of mobility on admission and as needed per policy Notes: ` Nutrition Nursing Diagnoses: Imbalanced nutrition Goals: Patient/caregiver agrees to and verbalizes understanding of need to use nutritional supplements and/or vitamins as prescribed Date Initiated: 05/29/2016 Target Resolution Date: 01/10/2017 Goal Status: Active Interventions: Assess patient nutrition upon admission and as needed per policy Notes: [name], [name] ([id number]) Orientation to the Wound Care Program Nursing Diagnoses: Knowledge deficit related to the wound healing center program Goals: Patient/caregiver will verbalize understanding of the [facility] Program Date Initiated: 05/29/2016 Target Resolution Date: 11/08/2016 Goal Status: Active Interventions: Provide education on orientation to the wound center Notes: ` Pain, Acute or Chronic Nursing Diagnoses: Pain, acute or chronic: actual or potential Potential alteration in comfort,  pain Goals: Patient will verbalize adequate pain control and receive pain control interventions during procedures as needed Date Initiated: 05/29/2016 Target Resolution Date: 02/07/2017 Goal Status: Active Interventions: Complete pain assessment as per visit requirements Notes: ` Wound/Skin Impairment Nursing Diagnoses: Impaired tissue integrity Knowledge deficit related to ulceration/compromised skin integrity Goals: Ulcer/skin breakdown will have a volume reduction of 80% by week 12 Date Initiated: 05/29/2016 Target Resolution Date: 02/07/2017 Goal Status: Active Interventions: Assess patient/caregiver ability to perform ulcer/skin care regimen upon admission and as needed [name], [name] ([id number]) Assess ulceration(s) every visit Notes: Electronic Signature(s) Signed: 10/23/2016 5:00:36 PM By: Alric Quan Entered By: Alric Quan on 10/23/2016 15:54:18 [name] ([id number]) -------------------------------------------------------------------------------- Pain Assessment Details Patient Name: [name]. Date of Service: 10/23/2016 3:30 PM Medical Record Number: [id number] Patient Account Number: [id number] Date of Birth/Sex: 06/16/1934 (82 y.o. Male) Treating RN: Ahmed Prima Primary Care Sharalee Witman: [name] Other Clinician: Referring Kayler Rise: [name] Treating Sanyah Molnar/Extender: Melburn Hake, HOYT Weeks in Treatment: 21 Active Problems Location of Pain Severity and Description of Pain Patient Has Paino No Site Locations With Dressing Change: No Pain Management and Medication Current Pain Management: Electronic  Signature(s) Signed: 10/23/2016 5:00:36 PM By: Alric Quan Entered By: Alric Quan on 10/23/2016 15:26:58 [name] ([id number]) -------------------------------------------------------------------------------- Patient/Caregiver Education Details Patient Name: [name], [name]. Date of Service: 10/23/2016  3:30 PM Medical Record Number: [id number] Patient Account Number: [id number] Date of Birth/Gender: [birth year] (82 y.o. Male) Treating RN: Ahmed Prima Primary Care Physician: [name] Other Clinician: Referring Physician: [name] Treating Physician/Extender: Sharalyn Ink in Treatment: 21 Education Assessment Education Provided To: Patient Education Topics Provided Wound/Skin Impairment: Handouts: Other: change dressing as ordered Methods: Demonstration, Explain/Verbal Responses: State content correctly Electronic Signature(s) Signed: 10/23/2016 5:00:36 PM By: Alric Quan Entered By: Alric Quan on 10/23/2016 16:21:15 [name] ([id number]) -------------------------------------------------------------------------------- Wound Assessment Details Patient Name: [name], [name]. Date of Service: 10/23/2016 3:30 PM Medical Record Number: [id number] Patient Account Number: [id number] Date of Birth/Sex: 03-Mar-1935 (82 y.o. Male) Treating RN: Ahmed Prima Primary Care Gayla Benn: [name] Other Clinician: Referring Alaster Asfaw: [name] Treating Zya Finkle/Extender: Melburn Hake, HOYT Weeks in Treatment: 21 Wound Status Wound Number: 2 Primary Pressure Ulcer Etiology: Wound Location: Left Malleolus - Lateral Wound Open Wounding Event: Pressure Injury Status: Date Acquired: 10/10/2016 Comorbid Cataracts, Anemia, Sleep Apnea, Weeks Of Treatment: 1 History: Arrhythmia, Hypertension, Dementia Clustered Wound: No Photos Photo Uploaded By: Montey Hora on 10/23/2016 16:15:12 Wound Measurements Length: (cm) 0.3 Width: (cm) 0.3 Depth: (cm) 0.1 Area: (cm) 0.071 Volume: (cm) 0.007 % Reduction in Area: 24.5% % Reduction in Volume: 22.2% Epithelialization: None Tunneling: No Undermining: No Wound Description Classification: Category/Stage II Foul Odor Aft Wound Margin: Distinct, outline attached Slough/Fibrin Exudate Amount:  Large Exudate Type: Serous Exudate Color: amber er Cleansing: No o Yes Wound Bed Granulation Amount: Large (67-100%) Granulation Quality: Red Necrotic Amount: Small (1-33%) Necrotic Quality: [address] [place]. ([id number]) Periwound Skin Texture Texture Color No Abnormalities Noted: No No Abnormalities Noted: No Moisture Temperature / Pain No Abnormalities Noted: No Temperature: No Abnormality Tenderness on Palpation: Yes Wound Preparation Ulcer Cleansing: Rinsed/Irrigated with Saline Topical Anesthetic Applied: Other: lidocaine 4%, Treatment Notes Wound #2 (Left, Lateral Malleolus) 1. Cleansed with: Clean wound with Normal Saline 2. Anesthetic Topical Lidocaine 4% cream to wound bed prior to debridement 4. Dressing Applied: Hydrogel Other dressing (specify in notes) 5. Secondary Dressing Applied Bordered Foam Dressing Dry Gauze Notes Sorbact Electronic Signature(s) Signed: 10/23/2016 5:00:36 PM By: Alric Quan Entered By: Alric Quan on 10/23/2016 15:53:49 [name] ([id number]) -------------------------------------------------------------------------------- [place] Details Patient Name: FAWZI, MELMAN. Date of Service: 10/23/2016 3:30 PM Medical Record Number: [id number] Patient Account Number: [id number] Date of Birth/Sex: October 22, 1934 (82 y.o. Male) Treating RN: Ahmed Prima Primary Care Quintella Mura: [name] Other Clinician: Referring Aundreya Souffrant: [name] Treating Ola Raap/Extender: Melburn Hake, HOYT Weeks in Treatment: 21 Vital Signs Time Taken: 15:27 Pulse (bpm): 54 Height (in): 69 Respiratory Rate (breaths/min): 18 Weight (lbs): 180 Blood Pressure (mmHg): 116/52 Body Mass Index (BMI): 26.6 Reference Range: 80 - 120 mg / dl Electronic Signature(s) Signed: 10/23/2016 5:00:36 PM By: Alric Quan Entered By: Alric Quan on 10/23/2016 15:27:11

## 2016-10-29 DIAGNOSIS — M1712 Unilateral primary osteoarthritis, left knee: Secondary | ICD-10-CM | POA: Diagnosis not present

## 2016-10-30 ENCOUNTER — Ambulatory Visit: Payer: PPO | Admitting: Surgery

## 2016-10-30 ENCOUNTER — Encounter: Payer: PPO | Admitting: Surgery

## 2016-10-30 DIAGNOSIS — L89529 Pressure ulcer of left ankle, unspecified stage: Secondary | ICD-10-CM | POA: Diagnosis not present

## 2016-10-30 DIAGNOSIS — L89522 Pressure ulcer of left ankle, stage 2: Secondary | ICD-10-CM | POA: Diagnosis not present

## 2016-10-31 NOTE — Progress Notes (Signed)
PHENG, PROKOP ([id number]) Visit Report for 10/30/2016 Arrival Information Details Patient Name: [name], [name]. Date of Service: 10/30/2016 2:30 PM Medical Record Number: [id number] Patient Account Number: [id number] Date of Birth/Sex: [birth year] (82 y.o. Male) Treating RN: Ahmed Prima Primary Care Taisei Bonnette: [name] Other Clinician: Referring Aengus Sauceda: [name] Treating Alvenia Treese/Extender: [name] in Treatment: 3 Visit Information History Since Last Visit All ordered tests and consults were completed: No Patient Arrived: Gilford Rile Added or deleted any medications: No Arrival Time: 14:20 Any new allergies or adverse reactions: No Accompanied By: wife Had a fall or experienced change in No Transfer Assistance: EasyPivot activities of daily living that may affect Patient Lift risk of falls: Patient Identification Verified: Yes Signs or symptoms of abuse/neglect since last No Secondary Verification Process Yes visito Completed: Hospitalized since last visit: No Patient Requires Transmission- No Has Dressing in Place as Prescribed: Yes Based Precautions: Pain Present Now: No Patient Has Alerts: Yes Patient Alerts: ASA Electronic Signature(s) Signed: 10/30/2016 4:15:33 PM By: Alric Quan Entered By: Alric Quan on 10/30/2016 14:22:48 [name] ([id number]) -------------------------------------------------------------------------------- Clinic Level of Care Assessment Details Patient Name: [name]. Date of Service: 10/30/2016 2:30 PM Medical Record Number: [id number] Patient Account Number: [id number] Date of Birth/Sex: [birth year] (82 y.o. Male) Treating RN: Ahmed Prima Primary Care  [place]: [name] Other Clinician: Referring Markeda Narvaez: [name] Treating Shamere Campas/Extender: [name] in Treatment: 22 Clinic Level of Care Assessment Items TOOL 4 Quantity Score X - Use when only an EandM is  performed on FOLLOW-UP visit 1 0 ASSESSMENTS - Nursing Assessment / Reassessment X - Reassessment of Co-morbidities (includes updates in patient status) 1 10 X - Reassessment of Adherence to Treatment Plan 1 5 ASSESSMENTS - Wound and Skin Assessment / Reassessment X - Simple Wound Assessment / Reassessment - one wound 1 5 []  - Complex Wound Assessment / Reassessment - multiple wounds 0 []  - Dermatologic / Skin Assessment (not related to wound area) 0 ASSESSMENTS - Focused Assessment []  - Circumferential Edema Measurements - multi extremities 0 []  - Nutritional Assessment / Counseling / Intervention 0 []  - Lower Extremity Assessment (monofilament, tuning fork, pulses) 0 []  - Peripheral Arterial Disease Assessment (using hand held doppler) 0 ASSESSMENTS - Ostomy and/or Continence Assessment and Care []  - Incontinence Assessment and Management 0 []  - Ostomy Care Assessment and Management (repouching, etc.) 0 PROCESS - Coordination of Care X - Simple Patient / Family Education for ongoing care 1 15 []  - Complex (extensive) Patient / Family Education for ongoing care 0 []  - Staff obtains Programmer, systems, Records, Test Results / Process Orders 0 []  - Staff telephones HHA, Nursing Homes / Clarify orders / etc 0 []  - Routine Transfer to another Facility (non-emergent condition) 0 ISSACC, MERLO. ([id number]) []  - Routine Hospital Admission (non-emergent condition) 0 []  - New Admissions / Biomedical engineer / Ordering NPWT, Apligraf, etc. 0 []  - Emergency Hospital Admission (emergent condition) 0 X - Simple Discharge Coordination 1 10 []  - Complex (extensive) Discharge Coordination 0 PROCESS - Special Needs []  - Pediatric / Minor Patient Management 0 []  - Isolation Patient Management 0 []  - Hearing / Language / Visual special needs 0 []  - Assessment of Community assistance (transportation, D/C planning, etc.) 0 []  - Additional assistance / Altered mentation 0 []  - Support Surface(s)  Assessment (bed, cushion, seat, etc.) 0 INTERVENTIONS - Wound Cleansing / Measurement X - Simple Wound Cleansing - one wound 1 5 []  - Complex Wound Cleansing -  multiple wounds 0 X - Wound Imaging (photographs - any number of wounds) 1 5 []  - Wound Tracing (instead of photographs) 0 []  - Simple Wound Measurement - one wound 0 []  - Complex Wound Measurement - multiple wounds 0 INTERVENTIONS - Wound Dressings []  - Small Wound Dressing one or multiple wounds 0 []  - Medium Wound Dressing one or multiple wounds 0 []  - Large Wound Dressing one or multiple wounds 0 []  - Application of Medications - topical 0 []  - Application of Medications - injection 0 INTERVENTIONS - Miscellaneous []  - External ear exam 0 TAYJON, HALLADAY. ([id number]) []  - Specimen Collection (cultures, biopsies, blood, body fluids, etc.) 0 []  - Specimen(s) / Culture(s) sent or taken to Lab for analysis 0 []  - Patient Transfer (multiple staff / [name] / Similar devices) 0 []  - Simple Staple / Suture removal (25 or less) 0 []  - Complex Staple / Suture removal (26 or more) 0 []  - Hypo / Hyperglycemic Management (close monitor of Blood Glucose) 0 []  - Ankle / Brachial Index (ABI) - do not check if billed separately 0 X - Vital Signs 1 5 Has the patient been seen at the hospital within the last three years: Yes Total Score: 60 Level Of Care: New/Established - Level 2 Electronic Signature(s) Signed: 10/30/2016 4:15:33 PM By: Alric Quan Entered By: Alric Quan on 10/30/2016 15:58:34 [name] ([id number]) -------------------------------------------------------------------------------- Encounter Discharge Information Details Patient Name: [name], [name]. Date of Service: 10/30/2016 2:30 PM Medical Record Number: [id number] Patient Account Number: [id number] Date of Birth/Sex: [birth year] (82 y.o. Male) Treating RN: Ahmed Prima Primary Care Ahlam Piscitelli: [name] Other Clinician: Referring  Lenis Nettleton: [name] Treating Jerian Morais/Extender: [name] in Treatment: 22 Encounter Discharge Information Items Discharge Pain Level: 0 Discharge Condition: Stable Ambulatory Status: Walker Discharge Destination: Home Transportation: Private Auto Accompanied By: wife Schedule Follow-up Appointment: No Medication Reconciliation completed No and provided to Patient/Care Adelina Collard: Provided on Clinical Summary of Care: 10/30/2016 Form Type Recipient Paper Patient ET Electronic Signature(s) Signed: 10/30/2016 2:43:24 PM By: Ruthine Dose Entered By: Ruthine Dose on 10/30/2016 14:43:23 [name] ([id number]) -------------------------------------------------------------------------------- Lower Extremity Assessment Details Patient Name: [name]. Date of Service: 10/30/2016 2:30 PM Medical Record Number: [id number] Patient Account Number: [id number] Date of Birth/Sex: [birth year] (82 y.o. Male) Treating RN: Ahmed Prima Primary Care Harshith Pursell: [name] Other Clinician: Referring Cleland Simkins: [name] Treating Haytham Maher/Extender: [name] in Treatment: 22 Vascular Assessment Pulses: Dorsalis Pedis Palpable: [Left:Yes] Posterior Tibial Extremity colors, hair growth, and conditions: Extremity Color: [Left:Normal] Temperature of Extremity: [Left:Warm] Capillary Refill: [Left:< 3 seconds] Electronic Signature(s) Signed: 10/30/2016 4:15:33 PM By: Alric Quan Entered By: Alric Quan on 10/30/2016 14:35:42 [place], [name] ([id number]) -------------------------------------------------------------------------------- Multi Wound Chart Details Patient Name: [name]. Date of Service: 10/30/2016 2:30 PM Medical Record Number: [id number] Patient Account Number: [id number] Date of Birth/Sex: 1934-08-13 (82 y.o. Male) Treating RN: Ahmed Prima Primary Care Kanylah Muench: [name] Other Clinician: Referring  Atthew Coutant: [name] Treating Maleigh Bagot/Extender: [name] in Treatment: 22 Vital Signs Height(in): 69 Pulse(bpm): 53 Weight(lbs): 180 Blood Pressure 133/53 (mmHg): Body Mass Index(BMI): 27 Temperature(F): 97.7 Respiratory Rate 18 (breaths/min): Photos: [2:No Photos] [N/A:N/A] Wound Location: [2:Left Malleolus - Lateral] [N/A:N/A] Wounding Event: [2:Pressure Injury] [N/A:N/A] Primary Etiology: [2:Pressure Ulcer] [N/A:N/A] Comorbid History: [2:Cataracts, Anemia, Sleep Apnea, Arrhythmia, Hypertension, Dementia] [N/A:N/A] Date Acquired: [2:10/10/2016] [N/A:N/A] Weeks of Treatment: [2:2] [N/A:N/A] Wound Status: [2:Open] [N/A:N/A] Measurements L x W x D 0x0x0 [N/A:N/A] (  cm) Area (cm) : [2:0] [N/A:N/A] Volume (cm) : [2:0] [N/A:N/A] % Reduction in Area: [2:100.00%] [N/A:N/A] % Reduction in Volume: 100.00% [N/A:N/A] Classification: [2:Category/Stage II] [N/A:N/A] Exudate Amount: [2:None Present] [N/A:N/A] Wound Margin: [2:Distinct, outline attached] [N/A:N/A] Granulation Amount: [2:None Present (0%)] [N/A:N/A] Necrotic Amount: [2:None Present (0%)] [N/A:N/A] Epithelialization: [2:Large (67-100%)] [N/A:N/A] Periwound Skin Texture: No Abnormalities Noted [N/A:N/A] Periwound Skin [2:No Abnormalities Noted] [N/A:N/A] Moisture: Periwound Skin Color: No Abnormalities Noted [N/A:N/A] Temperature: [2:No Abnormality] [N/A:N/A] Tenderness on [2:Yes] [N/A:N/A] Palpation: KHIRY, PASQUARIELLO ([id number]) Wound Preparation: Ulcer Cleansing: N/A N/A Rinsed/Irrigated with Saline Topical Anesthetic Applied: None Treatment Notes Electronic Signature(s) Signed: 10/30/2016 2:44:19 PM By: Christin Fudge MD, FACS Entered By: Christin Fudge on 10/30/2016 14:44:19 [name] ([id number]) -------------------------------------------------------------------------------- Sunrise Details Patient Name: SAMIL, MECHAM. Date of Service: 10/30/2016 2:30  PM Medical Record Number: [id number] Patient Account Number: [id number] Date of Birth/Sex: July 15, 1934 (82 y.o. Male) Treating RN: Ahmed Prima Primary Care Ridley Dileo: [name] Other Clinician: Referring Jonella Redditt: [name] Treating Jadea Shiffer/Extender: [name] in Treatment: 22 Active Inactive Electronic Signature(s) Signed: 10/30/2016 4:15:33 PM By: Alric Quan Entered By: Alric Quan on 10/30/2016 14:36:17 [name] ([id number]) -------------------------------------------------------------------------------- Pain Assessment Details Patient Name: BARAN, KUHRT. Date of Service: 10/30/2016 2:30 PM Medical Record Number: [id number] Patient Account Number: [id number] Date of Birth/Sex: 1935/02/28 (82 y.o. Male) Treating RN: Ahmed Prima Primary Care Paolo Okane: [name] Other Clinician: Referring Mylissa Lambe: [name] Treating Yanelie Abraha/Extender: [name] in Treatment: 22 Active Problems Location of Pain Severity and Description of Pain Patient Has Paino No Site Locations With Dressing Change: No Pain Management and Medication Current Pain Management: Electronic Signature(s) Signed: 10/30/2016 4:15:33 PM By: Alric Quan Entered By: Alric Quan on 10/30/2016 14:22:54 [name] ([id number]) -------------------------------------------------------------------------------- Patient/Caregiver Education Details Patient Name: FRUTOSO, DIMARE. Date of Service: 10/30/2016 2:30 PM Medical Record Number: [id number] Patient Account Number: [id number] Date of Birth/Gender: 18-Sep-1934 (82 y.o. Male) Treating RN: Ahmed Prima Primary Care Physician: [name] Other Clinician: Referring Physician: [name] Treating Physician/Extender: [name] in Treatment: 22 Education Assessment Education Provided To: Patient and Caregiver Education Topics Provided Wound/Skin Impairment: Other:  Keep area clean and dry. Keep pressure off of area. Please call our office if you have Handouts: any questions Methods: Explain/Verbal Responses: State content correctly Electronic Signature(s) Signed: 10/30/2016 4:15:33 PM By: Alric Quan Entered By: Alric Quan on 10/30/2016 14:38:08 [name] ([id number]) -------------------------------------------------------------------------------- Wound Assessment Details Patient Name: NORLAN, RANN. Date of Service: 10/30/2016 2:30 PM Medical Record Number: [id number] Patient Account Number: [id number] Date of Birth/Sex: 10/10/1934 (82 y.o. Male) Treating RN: Ahmed Prima Primary Care Tarin Johndrow: [name] Other Clinician: Referring Lennox Leikam: [name] Treating Neng Albee/Extender: [name] in Treatment: 22 Wound Status Wound Number: 2 Primary Pressure Ulcer Etiology: Wound Location: Left Malleolus - Lateral Wound Open Wounding Event: Pressure Injury Status: Date Acquired: 10/10/2016 Comorbid Cataracts, Anemia, Sleep Apnea, Weeks Of Treatment: 2 History: Arrhythmia, Hypertension, Dementia Clustered Wound: No Photos Photo Uploaded By: Alric Quan on 10/30/2016 15:34:58 Wound Measurements Length: (cm) 0 % Reducti Width: (cm) 0 % Reducti Depth: (cm) 0 Epithelia Area: (cm) 0 Tunnelin Volume: (cm) 0 Undermin on in Area: 100% on in Volume: 100% lization: Large (67-100%) g: No ing: No Wound Description Classification: Category/Stage II Wound Margin: Distinct, outline attached Exudate Amount: None Present Foul Odor After Cleansing: No Slough/Fibrino No Wound Bed Granulation Amount: None Present (0%) Necrotic Amount: None Present (0%) Periwound  Skin Texture Texture Color No Abnormalities Noted: No No Abnormalities Noted: No Moisture Temperature / Pain DURWARD, MATRANGA. ([id number]) No Abnormalities Noted: No Temperature: No Abnormality Tenderness on Palpation: Yes Wound  Preparation Ulcer Cleansing: Rinsed/Irrigated with Saline Topical Anesthetic Applied: None Electronic Signature(s) Signed: 10/30/2016 4:15:33 PM By: Alric Quan Entered By: Alric Quan on 10/30/2016 14:35:21 [name] ([id number]) -------------------------------------------------------------------------------- Vitals Details Patient Name: [name]. Date of Service: 10/30/2016 2:30 PM Medical Record Number: [id number] Patient Account Number: [id number] Date of Birth/Sex: [birth year] (82 y.o. Male) Treating RN: Ahmed Prima Primary Care Ilijah Doucet: [name] Other Clinician: Referring Marston Mccadden: [name] Treating Paden Senger/Extender: [name] in Treatment: 22 Vital Signs Time Taken: 14:22 Temperature (F): 97.7 Height (in): 69 Pulse (bpm): 53 Weight (lbs): 180 Respiratory Rate (breaths/min): 18 Body Mass Index (BMI): 26.6 Blood Pressure (mmHg): 133/53 Reference Range: 80 - 120 mg / dl Electronic Signature(s) Signed: 10/30/2016 4:15:33 PM By: Alric Quan Entered By: Alric Quan on 10/30/2016 14:25:18

## 2016-10-31 NOTE — Progress Notes (Signed)
[name], [name] ([id number]) Visit Report for 10/30/2016 Chief Complaint Document Details Patient Name: [name], [name]. Date of Service: 10/30/2016 2:30 PM Medical Record Number: [id number] Patient Account Number: [id number] Date of Birth/Sex: Mar 09, 1935 (82 y.o. Male) Treating RN: Ahmed Prima Primary Care Provider: [name] Other Clinician: Referring Provider: [name] Treating Provider/Extender: [name] in Treatment: 22 Information Obtained from: Patient Chief Complaint Patient is at the clinic for treatment of an open pressure ulcer to the left ankle Electronic Signature(s) Signed: 10/30/2016 2:44:28 PM By: Christin Fudge MD, FACS Entered By: Christin Fudge on 10/30/2016 14:44:27 [name] ([id number]) -------------------------------------------------------------------------------- HPI Details Patient Name: [name], [name]. Date of Service: 10/30/2016 2:30 PM Medical Record Number: [id number] Patient Account Number: [id number] Date of Birth/Sex: [birth year] (82 y.o. Male) Treating RN: Ahmed Prima Primary Care Provider: [name] Other Clinician: Referring Provider: [name] Treating Provider/Extender: [name] in Treatment: 22 History of Present Illness Location: left ankle laterally Quality: Patient reports experiencing a dull pain to affected area(s). Duration: Patient has had the wound for < 5 weeks prior to presenting for treatment Timing: Pain in wound is Intermittent (comes and goes Context: The wound appeared gradually over time Modifying Factors: Other treatment(s) tried include:put on Bactroban and doxycycline Associated Signs and Symptoms: Patient reports having difficulty standing for long periods. HPI Description: 82 year old gentleman who is known to have a history of dementia but is fairly functional and is able to ambulate has been having a pressure ulcer to the left ankle due to laying in bed for  longer than he should. Past history of atrial fibrillation, dementia, hyperlipidemia, hypertension. He's also had some orthopedic related surgeries in the past and was a former smoker and is given up in 1969. Recently seen by his PCP who put him on doxycycline and asked him to apply Bactroban and see [place] at the wound clinic. 06/05/2016 -- had a x-ray of the left ankle which showed no acute bony pathology. 10/16/2016 -- the patient had been recently discharged on May 24 and was doing fine. His wife noticed some drainage from the wound and hence brought him back today. Nothing else has changed in his health. 10/23/16 patient's wound appears to be doing even better on this week's evaluation in my opinion. There is just a very small opening and minimal discharge noted. He is pleased with how things have been progressing and he tells me that he tries to where the cushioned boots when he sleeps at night but that it's not the most comfortable and often he feels like the only way to get comfortable is to sleep without it. Obviously I think this can be bad for the wound as there is pressure then applied to the lateral malleolus. Electronic Signature(s) Signed: 10/30/2016 2:44:58 PM By: Christin Fudge MD, FACS Entered By: Christin Fudge on 10/30/2016 14:44:57 [name] ([id number]) -------------------------------------------------------------------------------- Physical Exam Details Patient Name: [name], [name]. Date of Service: 10/30/2016 2:30 PM Medical Record Number: [id number] Patient Account Number: [id number] Date of Birth/Sex: 1934-09-12 (82 y.o. Male) Treating RN: Ahmed Prima Primary Care Provider: [name] Other Clinician: Referring Provider: [name] Treating Provider/Extender: [name] in Treatment: 22 Constitutional . Pulse regular. Respirations normal and unlabored. Afebrile. . Eyes Nonicteric. Reactive to light. Ears, Nose, Mouth, and Throat Lips,  teeth, and gums WNL.Marland Kitchen Moist mucosa without lesions. Neck supple and nontender. No palpable supraclavicular or cervical adenopathy. Normal sized without goiter. Respiratory WNL. No retractions.. Cardiovascular  Pedal Pulses WNL. No clubbing, cyanosis or edema. Lymphatic No adneopathy. No adenopathy. No adenopathy. Musculoskeletal Adexa without tenderness or enlargement.. Digits and nails w/o clubbing, cyanosis, infection, petechiae, ischemia, or inflammatory conditions.. Integumentary (Hair, Skin) No suspicious lesions. No crepitus or fluctuance. No peri-wound warmth or erythema. No masses.Marland Kitchen Psychiatric Judgement and insight Intact.. No evidence of depression, anxiety, or agitation.. Notes the wound looks excellent and is closed. Electronic Signature(s) Signed: 10/30/2016 2:46:59 PM By: Christin Fudge MD, FACS Entered By: Christin Fudge on 10/30/2016 14:46:58 [name] ([id number]) -------------------------------------------------------------------------------- Physician Orders Details Patient Name: [name], [name]. Date of Service: 10/30/2016 2:30 PM Medical Record Number: [id number] Patient Account Number: [id number] Date of Birth/Sex: May 09, 1934 (82 y.o. Male) Treating RN: Ahmed Prima Primary Care Provider: [name] Other Clinician: Referring Provider: [name] Treating Provider/Extender: [name] in Treatment: 22 Verbal / Phone Orders: Yes Clinician: Carolyne Fiscal, Debi Read Back and Verified: Yes Diagnosis Coding Discharge From [facility] Services o Discharge from [place] area clean and dry. Keep pressure off of area. Please call our office if you have any questions or concerns. Electronic Signature(s) Signed: 10/30/2016 4:03:32 PM By: Christin Fudge MD, FACS Signed: 10/30/2016 4:15:33 PM By: Alric Quan Entered By: Alric Quan on 10/30/2016 14:37:23 [name]  ([id number]) -------------------------------------------------------------------------------- Problem List Details Patient Name: [name], [name]. Date of Service: 10/30/2016 2:30 PM Medical Record Number: [id number] Patient Account Number: [id number] Date of Birth/Sex: [birth year] (82 y.o. Male) Treating RN: Ahmed Prima Primary Care Provider: [name] Other Clinician: Referring Provider: [name] Treating Provider/Extender: [name] in Treatment: 22 Active Problems ICD-10 Encounter Code Description Active Date Diagnosis [phone] Pressure ulcer of left ankle, stage 2 05/29/2016 Yes F02.80 Dementia in other diseases classified elsewhere without 05/29/2016 Yes behavioral disturbance Inactive Problems Resolved Problems Electronic Signature(s) Signed: 10/30/2016 2:44:15 PM By: Christin Fudge MD, FACS Entered By: Christin Fudge on 10/30/2016 14:44:14 [name] ([id number]) -------------------------------------------------------------------------------- Progress Note Details Patient Name: [name], [name]. Date of Service: 10/30/2016 2:30 PM Medical Record Number: [id number] Patient Account Number: [id number] Date of Birth/Sex: 25-Jul-1934 (82 y.o. Male) Treating RN: Ahmed Prima Primary Care Provider: [name] Other Clinician: Referring Provider: [name] Treating Provider/Extender: [name] in Treatment: 22 Subjective Chief Complaint Information obtained from Patient Patient is at the clinic for treatment of an open pressure ulcer to the left ankle History of Present Illness (HPI) The following HPI elements were documented for the patient's wound: Location: left ankle laterally Quality: Patient reports experiencing a dull pain to affected area(s). Duration: Patient has had the wound for < 5 weeks prior to presenting for treatment Timing: Pain in wound is Intermittent (comes and goes Context: The wound appeared gradually  over time Modifying Factors: Other treatment(s) tried include:put on Bactroban and doxycycline Associated Signs and Symptoms: Patient reports having difficulty standing for long periods. 81 year old gentleman who is known to have a history of dementia but is fairly functional and is able to ambulate has been having a pressure ulcer to the left ankle due to laying in bed for longer than he should. Past history of atrial fibrillation, dementia, hyperlipidemia, hypertension. He's also had some orthopedic related surgeries in the past and was a former smoker and is given up in 1969. Recently seen by his PCP who put him on doxycycline and asked him to apply Bactroban and see [place] at the wound clinic. 06/05/2016 -- had a x-ray of the left ankle which showed  no acute bony pathology. 10/16/2016 -- the patient had been recently discharged on May 24 and was doing fine. His wife noticed some drainage from the wound and hence brought him back today. Nothing else has changed in his health. 10/23/16 patient's wound appears to be doing even better on this week's evaluation in my opinion. There is just a very small opening and minimal discharge noted. He is pleased with how things have been progressing and he tells me that he tries to where the cushioned boots when he sleeps at night but that it's not the most comfortable and often he feels like the only way to get comfortable is to sleep without it. Obviously I think this can be bad for the wound as there is pressure then applied to the lateral malleolus. [name], [name]. ([id number]) Objective Constitutional Pulse regular. Respirations normal and unlabored. Afebrile. Vitals Time Taken: 2:22 PM, Height: 69 in, Weight: 180 lbs, BMI: 26.6, Temperature: 97.7 F, Pulse: 53 bpm, Respiratory Rate: 18 breaths/min, Blood Pressure: 133/53 mmHg. Eyes Nonicteric. Reactive to light. Ears, Nose, Mouth, and Throat Lips, teeth, and gums WNL.Marland Kitchen Moist mucosa without  lesions. Neck supple and nontender. No palpable supraclavicular or cervical adenopathy. Normal sized without goiter. Respiratory WNL. No retractions.. Cardiovascular Pedal Pulses WNL. No clubbing, cyanosis or edema. Lymphatic No adneopathy. No adenopathy. No adenopathy. Musculoskeletal Adexa without tenderness or enlargement.. Digits and nails w/o clubbing, cyanosis, infection, petechiae, ischemia, or inflammatory conditions.Marland Kitchen Psychiatric Judgement and insight Intact.. No evidence of depression, anxiety, or agitation.. General Notes: the wound looks excellent and is closed. Integumentary (Hair, Skin) No suspicious lesions. No crepitus or fluctuance. No peri-wound warmth or erythema. No masses.. Wound #2 status is Open. Original cause of wound was Pressure Injury. The wound is located on the Left,Lateral Malleolus. The wound measures 0cm length x 0cm width x 0cm depth; 0cm^2 area and 0cm^3 volume. There is no tunneling or undermining noted. There is a none present amount of drainage noted. The wound margin is distinct with the outline attached to the wound base. There is no granulation within the wound bed. There is no necrotic tissue within the wound bed. Periwound temperature was noted as No Abnormality. The periwound has tenderness on palpation. [name], [name] ([id number]) Assessment Active Problems ICD-10 [phone] - Pressure ulcer of left ankle, stage 2 F02.80 - Dementia in other diseases classified elsewhere without behavioral disturbance Plan Discharge From [facility] Services: Discharge from [place] area clean and dry. Keep pressure off of area. Please call our office if you have any questions or concerns. the wound is closed and there is excellent progress over the last couple of weeks. I have discharged him from the wound care services and will see him back only if there is drainage or any recurrence of ulceration. Electronic Signature(s) Signed: 10/30/2016  2:47:29 PM By: Christin Fudge MD, FACS Entered By: Christin Fudge on 10/30/2016 14:47:28 [name] ([id number]) -------------------------------------------------------------------------------- SuperBill Details Patient Name: [name], [name]. Date of Service: 10/30/2016 Medical Record Number: [id number] Patient Account Number: [id number] Date of Birth/Sex: Sep 08, 1934 (82 y.o. Male) Treating RN: Ahmed Prima Primary Care Provider: [name] Other Clinician: Referring Provider: [name] Treating Provider/Extender: [name] in Treatment: 22 Diagnosis Coding ICD-10 Codes Code Description [phone] Pressure ulcer of left ankle, stage 2 F02.80 Dementia in other diseases classified elsewhere without behavioral disturbance Facility Procedures CPT4 Code: [id number] Description: [phone] - WOUND CARE VISIT-LEV 2 EST PT Modifier: Quantity: 1 Physician Procedures CPT4: Description  Modifier Quantity Code [id number] - WC PHYS LEVEL 2 - EST PT 1 ICD-10 Description Diagnosis L89.522 Pressure ulcer of left ankle, stage 2 F02.80 Dementia in other diseases classified elsewhere without behavioral disturbance Electronic Signature(s) Signed: 10/30/2016 4:03:32 PM By: Christin Fudge MD, FACS Signed: 10/30/2016 4:15:33 PM By: Alric Quan Previous Signature: 10/30/2016 2:47:41 PM Version By: Christin Fudge MD, FACS Entered By: Alric Quan on 10/30/2016 15:58:42

## 2016-11-06 ENCOUNTER — Encounter: Payer: PPO | Admitting: Surgery

## 2016-11-07 ENCOUNTER — Ambulatory Visit: Payer: PPO | Admitting: Physician Assistant

## 2016-11-13 ENCOUNTER — Encounter: Payer: PPO | Attending: Physician Assistant | Admitting: Physician Assistant

## 2016-11-13 DIAGNOSIS — G4730 Sleep apnea, unspecified: Secondary | ICD-10-CM | POA: Diagnosis not present

## 2016-11-13 DIAGNOSIS — E785 Hyperlipidemia, unspecified: Secondary | ICD-10-CM | POA: Insufficient documentation

## 2016-11-13 DIAGNOSIS — H269 Unspecified cataract: Secondary | ICD-10-CM | POA: Insufficient documentation

## 2016-11-13 DIAGNOSIS — I10 Essential (primary) hypertension: Secondary | ICD-10-CM | POA: Insufficient documentation

## 2016-11-13 DIAGNOSIS — Z87891 Personal history of nicotine dependence: Secondary | ICD-10-CM | POA: Insufficient documentation

## 2016-11-13 DIAGNOSIS — I4891 Unspecified atrial fibrillation: Secondary | ICD-10-CM | POA: Diagnosis not present

## 2016-11-13 DIAGNOSIS — L89522 Pressure ulcer of left ankle, stage 2: Secondary | ICD-10-CM | POA: Diagnosis not present

## 2016-11-13 DIAGNOSIS — F0280 Dementia in other diseases classified elsewhere without behavioral disturbance: Secondary | ICD-10-CM | POA: Insufficient documentation

## 2016-11-13 DIAGNOSIS — D649 Anemia, unspecified: Secondary | ICD-10-CM | POA: Insufficient documentation

## 2016-11-15 NOTE — Progress Notes (Signed)
AARIN, BLUETT ([id number]) Visit Report for 11/13/2016 Chief Complaint Document Details Patient Name: [name], [name]. Date of Service: 11/13/2016 2:30 PM Medical Record Number: [id number] Patient Account Number: [id number] Date of Birth/Sex: [birth year] (82 y.o. Male) Treating RN: Ahmed Prima Primary Care Provider: [name] Other Clinician: Referring Provider: [name] Treating Provider/Extender: Melburn Hake, Hutton Pellicane Weeks in Treatment: 24 Information Obtained from: Patient Chief Complaint Patient is at the clinic for treatment of an open pressure ulcer to the left ankle Electronic Signature(s) Signed: 11/13/2016 5:22:22 PM By: Worthy Keeler PA-C Entered By: Worthy Keeler on 11/13/2016 17:13:45 [name] ([id number]) -------------------------------------------------------------------------------- Debridement Details Patient Name: [name]. Date of Service: 11/13/2016 2:30 PM Medical Record Number: [id number] Patient Account Number: [id number] Date of Birth/Sex: 1934/08/04 (82 y.o. Male) Treating RN: Ahmed Prima Primary Care Provider: [name] Other Clinician: Referring Provider: [name] Treating Provider/Extender: Melburn Hake, Ladale Sherburn Weeks in Treatment: 24 Debridement Performed for Wound #2 Left,Lateral Malleolus Assessment: Performed By: Physician STONE III, Nasreen Goedecke E., PA-C Debridement: Debridement Pre-procedure Verification/Time Out Yes - 15:11 Taken: Start Time: 15:12 Pain Control: Lidocaine 4% Topical Solution Level: Skin/Subcutaneous Tissue Total Area Debrided (L x 0.2 (cm) x 0.3 (cm) = 0.06 (cm) W): Tissue and other Viable, Non-Viable, Exudate, Fibrin/Slough, Subcutaneous material debrided: Instrument: Curette Bleeding: Minimum Hemostasis Achieved: Pressure End Time: 15:14 Procedural Pain: 0 Post Procedural Pain: 0 Response to Treatment: Procedure was tolerated well Post Debridement Measurements of Total  Wound Length: (cm) 0.2 Stage: Category/Stage II Width: (cm) 0.3 Depth: (cm) 0.2 Volume: (cm) 0.009 Character of Wound/Ulcer Post Requires Further Debridement: Debridement Post Procedure Diagnosis Same as Pre-procedure Electronic Signature(s) Signed: 11/13/2016 4:28:11 PM By: Alric Quan Signed: 11/13/2016 5:22:22 PM By: Worthy Keeler PA-C Entered By: Alric Quan on 11/13/2016 15:16:47 [name] ([id number]) -------------------------------------------------------------------------------- HPI Details Patient Name: [name], [name]. Date of Service: 11/13/2016 2:30 PM Medical Record Number: [id number] Patient Account Number: [id number] Date of Birth/Sex: [birth year] (82 y.o. Male) Treating RN: Ahmed Prima Primary Care Provider: [name] Other Clinician: Referring Provider: [name] Treating Provider/Extender: Melburn Hake, Areanna Gengler Weeks in Treatment: 24 History of Present Illness Location: left ankle laterally Quality: Patient reports experiencing a dull pain to affected area(s). Duration: Patient has had the wound for < 5 weeks prior to presenting for treatment Timing: Pain in wound is Intermittent (comes and goes Context: The wound appeared gradually over time Modifying Factors: Other treatment(s) tried include:put on Bactroban and doxycycline Associated Signs and Symptoms: Patient reports having difficulty standing for long periods. HPI Description: 82 year old gentleman who is known to have a history of dementia but is fairly functional and is able to ambulate has been having a pressure ulcer to the left ankle due to laying in bed for longer than he should. Past history of atrial fibrillation, dementia, hyperlipidemia, hypertension. He's also had some orthopedic related surgeries in the past and was a former smoker and is given up in 1969. Recently seen by his PCP who put him on doxycycline and asked him to apply Bactroban and see [place] at the wound  clinic. 06/05/2016 -- had a x-ray of the left ankle which showed no acute bony pathology. 10/16/2016 -- the patient had been recently discharged on May 24 and was doing fine. His wife noticed some drainage from the wound and hence brought him back today. Nothing else has changed in his health. 10/23/16 patient's wound appears to be doing even better on this week's evaluation in  my opinion. There is just a very small opening and minimal discharge noted. He is pleased with how things have been progressing and he tells me that he tries to where the cushioned boots when he sleeps at night but that it's not the most comfortable and often he feels like the only way to get comfortable is to sleep without it. Obviously I think this can be bad for the wound as there is pressure then applied to the lateral malleolus. 11/13/16 on evaluation today patient's wound over the left ankle unfortunately has reopened. He is having some mild discomfort only but nothing significant. He has been intermittent at best in regard to wearing his cushion for nighttime to prevent pressure to the lateral ankle region. His wife tells me that she has been trying to get him to where this more regularly and wonders if that is appropriate. Electronic Signature(s) Signed: 11/13/2016 5:22:22 PM By: Worthy Keeler PA-C Entered By: Worthy Keeler on 11/13/2016 17:14:15 [name] ([id number]) -------------------------------------------------------------------------------- Physical Exam Details Patient Name: [name], [name]. Date of Service: 11/13/2016 2:30 PM Medical Record Number: [id number] Patient Account Number: [id number] Date of Birth/Sex: 03-09-1935 (82 y.o. Male) Treating RN: Ahmed Prima Primary Care Provider: [name] Other Clinician: Referring Provider: [name] Treating Provider/Extender: Melburn Hake, Shahrzad Koble Weeks in Treatment: 11 Constitutional Well-nourished and well-hydrated in no acute  distress. Respiratory normal breathing without difficulty. Psychiatric this patient is able to make decisions and demonstrates good insight into disease process. Alert and Oriented x 3. pleasant and cooperative. Notes Patient's wound does have some Slough covering the central portion of the wound bed which did required debridement today. Fortunately this cleaned up very well and he has a good granular surface is some fascia Exposure but this is minimal. there's no evidence of infection. Electronic Signature(s) Signed: 11/13/2016 5:22:22 PM By: Worthy Keeler PA-C Entered By: Worthy Keeler on 11/13/2016 17:15:27 [name] ([id number]) -------------------------------------------------------------------------------- Physician Orders Details Patient Name: [name], [name]. Date of Service: 11/13/2016 2:30 PM Medical Record Number: [id number] Patient Account Number: [id number] Date of Birth/Sex: 1934/06/02 (82 y.o. Male) Treating RN: Ahmed Prima Primary Care Provider: [name] Other Clinician: Referring Provider: [name] Treating Provider/Extender: Melburn Hake, Ailanie Ruttan Weeks in Treatment: 21 Verbal / Phone Orders: Yes Clinician: Carolyne Fiscal, Debi Read Back and Verified: Yes Diagnosis Coding ICD-10 Coding Code Description [phone] Pressure ulcer of left ankle, stage 2 F02.80 Dementia in other diseases classified elsewhere without behavioral disturbance Wound Cleansing Wound #2 Left,Lateral Malleolus o Clean wound with Normal Saline. o Cleanse wound with mild soap and water o May Shower, gently pat wound dry prior to applying new dressing. Anesthetic Wound #2 Left,Lateral Malleolus o Topical Lidocaine 4% cream applied to wound bed prior to debridement Skin Barriers/Peri-Wound Care Wound #2 Left,Lateral Malleolus o Skin Prep Primary Wound Dressing Wound #2 Left,Lateral Malleolus o Prisma Ag - moisten with saline Secondary Dressing Wound #2  Left,Lateral Malleolus o Dry Gauze o Boardered Foam Dressing Dressing Change Frequency Wound #2 Left,Lateral Malleolus o Change dressing every other day. Follow-up Appointments Wound #2 Left,Lateral Malleolus o Return Appointment in 1 week. [name], MOHS. ([id number]) Edema Control Wound #2 Left,Lateral Malleolus o Elevate legs to the level of the heart and pump ankles as often as possible Off-Loading Wound #2 Left,Lateral Malleolus o Turn and reposition every 2 hours o Other: - elevate heels while in bed Additional Orders / Instructions Wound #2 Left,Lateral Malleolus o Increase protein intake. Notes We  will reinitiate dressing with silver collagen for the next week. Hopefully we can get this one to close backup appropriately and quickly and then he may just need to be sure to wear his cushion boot night for the extended period of time 1-2 months in order to ensure this fully closes before discontinuing. We will see him for fault evaluation in one week to see were things stand at that point. Electronic Signature(s) Signed: 11/13/2016 5:22:22 PM By: Worthy Keeler PA-C Previous Signature: 11/13/2016 4:28:11 PM Version By: Alric Quan Entered By: Worthy Keeler on 11/13/2016 17:16:55 [name] ([id number]) -------------------------------------------------------------------------------- Problem List Details Patient Name: [name], MAU. Date of Service: 11/13/2016 2:30 PM Medical Record Number: [id number] Patient Account Number: [id number] Date of Birth/Sex: 09-09-1934 (82 y.o. Male) Treating RN: Ahmed Prima Primary Care Provider: [name] Other Clinician: Referring Provider: [name] Treating Provider/Extender: Melburn Hake, Terrion Gencarelli Weeks in Treatment: 24 Active Problems ICD-10 Encounter Code Description Active Date Diagnosis L89.523 Pressure ulcer of left ankle, stage 3 05/29/2016 Yes F02.80 Dementia in other diseases classified  elsewhere without 05/29/2016 Yes behavioral disturbance Inactive Problems Resolved Problems Electronic Signature(s) Signed: 11/13/2016 5:22:22 PM By: Worthy Keeler PA-C Entered By: Worthy Keeler on 11/13/2016 17:20:17 [name] ([id number]) -------------------------------------------------------------------------------- Progress Note Details Patient Name: [name]. Date of Service: 11/13/2016 2:30 PM Medical Record Number: [id number] Patient Account Number: [id number] Date of Birth/Sex: Jul 29, 1934 (82 y.o. Male) Treating RN: Ahmed Prima Primary Care Provider: [name] Other Clinician: Referring Provider: [name] Treating Provider/Extender: Melburn Hake, Lathaniel Legate Weeks in Treatment: 24 Subjective Chief Complaint Information obtained from Patient Patient is at the clinic for treatment of an open pressure ulcer to the left ankle History of Present Illness (HPI) The following HPI elements were documented for the patient's wound: Location: left ankle laterally Quality: Patient reports experiencing a dull pain to affected area(s). Duration: Patient has had the wound for < 5 weeks prior to presenting for treatment Timing: Pain in wound is Intermittent (comes and goes Context: The wound appeared gradually over time Modifying Factors: Other treatment(s) tried include:put on Bactroban and doxycycline Associated Signs and Symptoms: Patient reports having difficulty standing for long periods. 82 year old gentleman who is known to have a history of dementia but is fairly functional and is able to ambulate has been having a pressure ulcer to the left ankle due to laying in bed for longer than he should. Past history of atrial fibrillation, dementia, hyperlipidemia, hypertension. He's also had some orthopedic related surgeries in the past and was a former smoker and is given up in 1969. Recently seen by his PCP who put him on doxycycline and asked him to apply  Bactroban and see [place] at the wound clinic. 06/05/2016 -- had a x-ray of the left ankle which showed no acute bony pathology. 10/16/2016 -- the patient had been recently discharged on May 24 and was doing fine. His wife noticed some drainage from the wound and hence brought him back today. Nothing else has changed in his health. 10/23/16 patient's wound appears to be doing even better on this week's evaluation in my opinion. There is just a very small opening and minimal discharge noted. He is pleased with how things have been progressing and he tells me that he tries to where the cushioned boots when he sleeps at night but that it's not the most comfortable and often he feels like the only way to get comfortable is to sleep without it.  Obviously I think this can be bad for the wound as there is pressure then applied to the lateral malleolus. 11/13/16 on evaluation today patient's wound over the left ankle unfortunately has reopened. He is having some mild discomfort only but nothing significant. He has been intermittent at best in regard to wearing his cushion for nighttime to prevent pressure to the lateral ankle region. His wife tells me that she has been trying to get him to where this more regularly and wonders if that is appropriate. [name], [name]. ([id number]) Objective Constitutional Well-nourished and well-hydrated in no acute distress. Vitals Time Taken: 2:44 PM, Height: 69 in, Weight: 180 lbs, BMI: 26.6, Temperature: 97.7 F, Pulse: 62 bpm, Respiratory Rate: 18 breaths/min, Blood Pressure: 124/57 mmHg. Respiratory normal breathing without difficulty. Psychiatric this patient is able to make decisions and demonstrates good insight into disease process. Alert and Oriented x 3. pleasant and cooperative. General Notes: Patient's wound does have some Slough covering the central portion of the wound bed which did required debridement today. Fortunately this cleaned up very well and he  has a good granular surface is some fascia Exposure but this is minimal. there's no evidence of infection. Integumentary (Hair, Skin) Wound #2 status is Open. Original cause of wound was Pressure Injury. The wound is located on the Left,Lateral Malleolus. The wound measures 0.2cm length x 0.3cm width x 0.1cm depth; 0.047cm^2 area and 0.005cm^3 volume. There is no tunneling or undermining noted. There is a large amount of serous drainage noted. The wound margin is distinct with the outline attached to the wound base. There is no granulation within the wound bed. There is a large (67-100%) amount of necrotic tissue within the wound bed including Adherent Slough. Periwound temperature was noted as No Abnormality. The periwound has tenderness on palpation. Assessment Active Problems ICD-10 L89.523 - Pressure ulcer of left ankle, stage 3 F02.80 - Dementia in other diseases classified elsewhere without behavioral disturbance [name], [name]. ([id number]) Procedures Wound #2 Pre-procedure diagnosis of Wound #2 is a Pressure Ulcer located on the Left,Lateral Malleolus . There was a Skin/Subcutaneous Tissue Debridement ([id number]) debridement with total area of 0.06 sq cm performed by STONE III, Mamoudou Mulvehill E., PA-C. with the following instrument(s): Curette to remove Viable and Non-Viable tissue/material including Exudate, Fibrin/Slough, and Subcutaneous after achieving pain control using Lidocaine 4% Topical Solution. A time out was conducted at 15:11, prior to the start of the procedure. A Minimum amount of bleeding was controlled with Pressure. The procedure was tolerated well with a pain level of 0 throughout and a pain level of 0 following the procedure. Post Debridement Measurements: 0.2cm length x 0.3cm width x 0.2cm depth; 0.009cm^3 volume. Post debridement Stage noted as Category/Stage II. Character of Wound/Ulcer Post Debridement requires further debridement. Post procedure Diagnosis Wound  #2: Same as Pre-Procedure Plan Wound Cleansing: Wound #2 Left,Lateral Malleolus: Clean wound with Normal Saline. Cleanse wound with mild soap and water May Shower, gently pat wound dry prior to applying new dressing. Anesthetic: Wound #2 Left,Lateral Malleolus: Topical Lidocaine 4% cream applied to wound bed prior to debridement Skin Barriers/Peri-Wound Care: Wound #2 Left,Lateral Malleolus: Skin Prep Primary Wound Dressing: Wound #2 Left,Lateral Malleolus: Prisma Ag - moisten with saline Secondary Dressing: Wound #2 Left,Lateral Malleolus: Dry Gauze Boardered Foam Dressing Dressing Change Frequency: Wound #2 Left,Lateral Malleolus: Change dressing every other day. Follow-up Appointments: Wound #2 Left,Lateral Malleolus: Return Appointment in 1 week. Edema Control: [name], [name] ([id number]) Wound #2 Left,Lateral Malleolus: Elevate legs to the level  of the heart and pump ankles as often as possible Off-Loading: Wound #2 Left,Lateral Malleolus: Turn and reposition every 2 hours Other: - elevate heels while in bed Additional Orders / Instructions: Wound #2 Left,Lateral Malleolus: Increase protein intake. General Notes: We will reinitiate dressing with silver collagen for the next week. Hopefully we can get this one to close backup appropriately and quickly and then he may just need to be sure to wear his cushion boot night for the extended period of time 1-2 months in order to ensure this fully closes before discontinuing. We will see him for fault evaluation in one week to see were things stand at that point. Electronic Signature(s) Signed: 11/13/2016 5:22:22 PM By: Worthy Keeler PA-C Entered By: Worthy Keeler on 11/13/2016 17:20:36 [name], [name] ([id number]) -------------------------------------------------------------------------------- SuperBill Details Patient Name: [name], [name]. Date of Service: 11/13/2016 Medical Record Number: [id number] Patient  Account Number: [id number] Date of Birth/Sex: 07-12-1934 (82 y.o. Male) Treating RN: Ahmed Prima Primary Care Provider: [name] Other Clinician: Referring Provider: [name] Treating Provider/Extender: Melburn Hake, Madisin Hasan Weeks in Treatment: 24 Diagnosis Coding ICD-10 Codes Code Description [phone] Pressure ulcer of left ankle, stage 3 F02.80 Dementia in other diseases classified elsewhere without behavioral disturbance Facility Procedures CPT4 Code: [id number] Description: 46270 - DEB SUBQ TISSUE 20 SQ CM/< ICD-10 Description Diagnosis L89.523 Pressure ulcer of left ankle, stage 3 Modifier: Quantity: 1 Physician Procedures CPT4 Code: [id number] Description: 11042 - WC PHYS SUBQ TISS 20 SQ CM ICD-10 Description Diagnosis L89.523 Pressure ulcer of left ankle, stage 3 Modifier: Quantity: 1 Electronic Signature(s) Signed: 11/13/2016 5:22:22 PM By: Worthy Keeler PA-C Entered By: Worthy Keeler on 11/13/2016 17:20:49

## 2016-11-17 ENCOUNTER — Other Ambulatory Visit (INDEPENDENT_AMBULATORY_CARE_PROVIDER_SITE_OTHER): Payer: Self-pay | Admitting: Vascular Surgery

## 2016-11-17 DIAGNOSIS — I6523 Occlusion and stenosis of bilateral carotid arteries: Secondary | ICD-10-CM

## 2016-11-17 DIAGNOSIS — L89520 Pressure ulcer of left ankle, unstageable: Secondary | ICD-10-CM | POA: Diagnosis not present

## 2016-11-17 NOTE — Progress Notes (Signed)
EURAL, HOLZSCHUH ([id number]) Visit Report for 11/13/2016 Arrival Information Details Patient Name: [name], [name]. Date of Service: 11/13/2016 2:30 PM Medical Record Number: [id number] Patient Account Number: [id number] Date of Birth/Sex: [birth year] (82 y.o. Male) Treating RN: Ahmed Prima Primary Care Nimisha Rathel: [name] Other Clinician: Referring Maisa Bedingfield: [name] Treating Floy Angert/Extender: Melburn Hake, HOYT Weeks in Treatment: 24 Visit Information History Since Last Visit All ordered tests and consults were completed: No Patient Arrived: [name] Added or deleted any medications: No Arrival Time: 14:43 Any new allergies or adverse reactions: No Accompanied By: wife Had a fall or experienced change in No Transfer Assistance: EasyPivot activities of daily living that may affect Patient Lift risk of falls: Patient Identification Verified: Yes Signs or symptoms of abuse/neglect since last No Secondary Verification Process Yes visito Completed: Hospitalized since last visit: No Patient Requires Transmission- No Has Dressing in Place as Prescribed: Yes Based Precautions: Pain Present Now: No Patient Has Alerts: Yes Patient Alerts: ASA Electronic Signature(s) Signed: 11/13/2016 4:28:11 PM By: Alric Quan Entered By: Alric Quan on 11/13/2016 14:44:10 [name] ([id number]) -------------------------------------------------------------------------------- Encounter Discharge Information Details Patient Name: [name], [name]. Date of Service: 11/13/2016 2:30 PM Medical Record Number: [id number] Patient Account Number: [id number] Date of Birth/Sex: 1935/02/03 (82 y.o. Male) Treating RN: Ahmed Prima Primary Care Christina Gintz: [name] Other Clinician: Referring Doyle Kunath: [name] Treating Willy Pinkerton/Extender: Melburn Hake, HOYT Weeks in Treatment: 24 Encounter Discharge Information Items Discharge Pain Level: 0 Discharge Condition:  Stable Ambulatory Status: Cane Discharge Destination: Home Transportation: Private Auto Accompanied By: wife Schedule Follow-up Appointment: Yes Medication Reconciliation completed No and provided to Patient/Care Levy Cedano: Provided on Clinical Summary of Care: 11/13/2016 Form Type Recipient Paper Patient ET Electronic Signature(s) Signed: 11/13/2016 3:26:54 PM By: Ruthine Dose Entered By: Ruthine Dose on 11/13/2016 15:26:54 [name] ([id number]) -------------------------------------------------------------------------------- Lower Extremity Assessment Details Patient Name: [name], [name]. Date of Service: 11/13/2016 2:30 PM Medical Record Number: [id number] Patient Account Number: [id number] Date of Birth/Sex: 1934-05-20 (82 y.o. Male) Treating RN: Ahmed Prima Primary Care Keanu Lesniak: [name] Other Clinician: Referring Danilynn Jemison: [name] Treating Sabino Denning/Extender: Melburn Hake, HOYT Weeks in Treatment: 24 Vascular Assessment Pulses: Dorsalis Pedis Palpable: [Left:Yes] Posterior Tibial Extremity colors, hair growth, and conditions: Extremity Color: [Left:Normal] Temperature of Extremity: [Left:Warm] Capillary Refill: [Left:< 3 seconds] Toe Nail Assessment Left: Right: Thick: No Discolored: No Deformed: No Improper Length and Hygiene: No Electronic Signature(s) Signed: 11/13/2016 4:28:11 PM By: Alric Quan Entered By: Alric Quan on 11/13/2016 14:53:16 [name] ([id number]) -------------------------------------------------------------------------------- Multi Wound Chart Details Patient Name: [name]. Date of Service: 11/13/2016 2:30 PM Medical Record Number: [id number] Patient Account Number: [id number] Date of Birth/Sex: 04/04/1935 (82 y.o. Male) Treating RN: Ahmed Prima Primary Care Leeloo Silverthorne: [name] Other Clinician: Referring Robinette Esters: [name] Treating Cindy Brindisi/Extender: Melburn Hake,  HOYT Weeks in Treatment: 24 Vital Signs Height(in): 69 Pulse(bpm): 62 Weight(lbs): 180 Blood Pressure 124/57 (mmHg): Body Mass Index(BMI): 27 Temperature(F): 97.7 Respiratory Rate 18 (breaths/min): Photos: [2:No Photos] [N/A:N/A] Wound Location: [2:Left Malleolus - Lateral] [N/A:N/A] Wounding Event: [2:Pressure Injury] [N/A:N/A] Primary Etiology: [2:Pressure Ulcer] [N/A:N/A] Comorbid History: [2:Cataracts, Anemia, Sleep Apnea, Arrhythmia, Hypertension, Dementia] [N/A:N/A] Date Acquired: [2:10/10/2016] [N/A:N/A] Weeks of Treatment: [2:4] [N/A:N/A] Wound Status: [2:Open] [N/A:N/A] Measurements L x W x D 0.2x0.3x0.1 [N/A:N/A] (cm) Area (cm) : [2:0.047] [N/A:N/A] Volume (cm) : [2:0.005] [N/A:N/A] % Reduction in Area: [2:50.00%] [N/A:N/A] % Reduction in Volume: 44.40% [N/A:N/A] Classification: [2:Category/Stage II] [N/A:N/A] Exudate Amount: [2:Large] [N/A:N/A] Exudate  Type: [2:Serous] [N/A:N/A] Exudate Color: [2:amber] [N/A:N/A] Wound Margin: [2:Distinct, outline attached] [N/A:N/A] Granulation Amount: [2:None Present (0%)] [N/A:N/A] Necrotic Amount: [2:Large (67-100%)] [N/A:N/A] Epithelialization: [2:Large (67-100%)] [N/A:N/A] Periwound Skin Texture: No Abnormalities Noted [N/A:N/A] Periwound Skin [2:No Abnormalities Noted] [N/A:N/A] Moisture: Periwound Skin Color: No Abnormalities Noted [N/A:N/A] Temperature: [2:No Abnormality] [N/A:N/A] Tenderness on Yes N/A N/A Palpation: Wound Preparation: Ulcer Cleansing: N/A N/A Rinsed/Irrigated with Saline Topical Anesthetic Applied: Other: lidocaine 4% Treatment Notes Electronic Signature(s) Signed: 11/13/2016 4:28:11 PM By: Alric Quan Entered By: Alric Quan on 11/13/2016 14:57:40 [name] ([id number]) -------------------------------------------------------------------------------- Multi-Disciplinary Care Plan Details Patient Name: [name], [name]. Date of Service: 11/13/2016 2:30 PM Medical Record  Number: [id number] Patient Account Number: [id number] Date of Birth/Sex: Jun 16, 1934 (81 y.o. Male) Treating RN: Ahmed Prima Primary Care Rice Walsh: [name] Other Clinician: Referring Egon Dittus: [name] Treating Ethridge Sollenberger/Extender: Melburn Hake, HOYT Weeks in Treatment: 24 Active Inactive ` Abuse / Safety / Falls / Self Care Management Nursing Diagnoses: Potential for falls Goals: Patient will not experience any injury related to falls Date Initiated: 10/16/2016 Target Resolution Date: 01/10/2017 Goal Status: Active Patient will remain injury free Date Initiated: 05/29/2016 Target Resolution Date: 08/02/2016 Goal Status: Active Patient will remain injury free related to falls Date Initiated: 11/13/2016 Target Resolution Date: 02/07/2017 Goal Status: Active Interventions: Assess fall risk on admission and as needed Assess impairment of mobility on admission and as needed per policy Assess personal safety and home safety (as indicated) on admission and as needed Notes: ` Nutrition Nursing Diagnoses: Imbalanced nutrition Goals: Patient/caregiver agrees to and verbalizes understanding of need to use nutritional supplements and/or vitamins as prescribed Date Initiated: 05/29/2016 Target Resolution Date: 01/10/2017 Goal Status: Active Interventions: [name], [name] ([id number]) Assess patient nutrition upon admission and as needed per policy Notes: ` Orientation to the Wound Care Program Nursing Diagnoses: Knowledge deficit related to the wound healing center program Goals: Patient/caregiver will verbalize understanding of the [facility] Date Initiated: 05/29/2016 Target Resolution Date: 11/08/2016 Goal Status: Active Interventions: Provide education on orientation to the wound center Notes: ` Pain, Acute or Chronic Nursing Diagnoses: Pain, acute or chronic: actual or potential Potential alteration in comfort, pain Goals: Patient will  verbalize adequate pain control and receive pain control interventions during procedures as needed Date Initiated: 05/29/2016 Target Resolution Date: 02/07/2017 Goal Status: Active Interventions: Complete pain assessment as per visit requirements Notes: ` Wound/Skin Impairment Nursing Diagnoses: Impaired tissue integrity Knowledge deficit related to ulceration/compromised skin integrity Goals: Ulcer/skin breakdown will have a volume reduction of 80% by week 12 Date Initiated: 05/29/2016 Target Resolution Date: 02/07/2017 [name], [name] ([id number]) Goal Status: Active Interventions: Assess patient/caregiver ability to perform ulcer/skin care regimen upon admission and as needed Assess ulceration(s) every visit Notes: Electronic Signature(s) Signed: 11/13/2016 4:28:11 PM By: Alric Quan Entered By: Alric Quan on 11/13/2016 14:57:19 [name] ([id number]) -------------------------------------------------------------------------------- Pain Assessment Details Patient Name: [name]. Date of Service: 11/13/2016 2:30 PM Medical Record Number: [id number] Patient Account Number: [id number] Date of Birth/Sex: Sep 20, 1934 (82 y.o. Male) Treating RN: Ahmed Prima Primary Care Dillion Stowers: [name] Other Clinician: Referring Makinzee Durley: [name] Treating Beckie Viscardi/Extender: Melburn Hake, HOYT Weeks in Treatment: 24 Active Problems Location of Pain Severity and Description of Pain Patient Has Paino No Site Locations With Dressing Change: No Pain Management and Medication Current Pain Management: Electronic Signature(s) Signed: 11/13/2016 4:28:11 PM By: Alric Quan Entered By: Alric Quan on 11/13/2016 14:44:18 [name] ([id number]) -------------------------------------------------------------------------------- Patient/Caregiver Education Details Patient  Name: [name], [name]. Date of Service: 11/13/2016 2:30 PM Medical Record  Number: [id number] Patient Account Number: [id number] Date of Birth/Gender: 1934-08-06 (82 y.o. Male) Treating RN: Ahmed Prima Primary Care Physician: [name] Other Clinician: Referring Physician: [name] Treating Physician/Extender: Sharalyn Ink in Treatment: 24 Education Assessment Education Provided To: Patient Education Topics Provided Wound/Skin Impairment: Handouts: Other: change dressing as ordered Methods: Demonstration, Explain/Verbal Responses: State content correctly Electronic Signature(s) Signed: 11/13/2016 4:28:11 PM By: Alric Quan Entered By: Alric Quan on 11/13/2016 14:58:13 [name] ([id number]) -------------------------------------------------------------------------------- Wound Assessment Details Patient Name: [name], [name]. Date of Service: 11/13/2016 2:30 PM Medical Record Number: [id number] Patient Account Number: [id number] Date of Birth/Sex: [birth year] (82 y.o. Male) Treating RN: Ahmed Prima Primary Care Tanza Pellot: [name] Other Clinician: Referring Merinda Victorino: [name] Treating Demond Shallenberger/Extender: Melburn Hake, HOYT Weeks in Treatment: 24 Wound Status Wound Number: 2 Primary Pressure Ulcer Etiology: Wound Location: Left Malleolus - Lateral Wound Open Wounding Event: Pressure Injury Status: Date Acquired: 10/10/2016 Comorbid Cataracts, Anemia, Sleep Apnea, Weeks Of Treatment: 4 History: Arrhythmia, Hypertension, Dementia Clustered Wound: No Photos Photo Uploaded By: Alric Quan on 11/13/2016 16:13:04 Wound Measurements Length: (cm) 0.2 Width: (cm) 0.3 Depth: (cm) 0.1 Area: (cm) 0.047 Volume: (cm) 0.005 % Reduction in Area: 50% % Reduction in Volume: 44.4% Epithelialization: Large (67-100%) Tunneling: No Undermining: No Wound Description Classification: Category/Stage II Foul Odor Afte Wound Margin: Distinct, outline attached Slough/Fibrino Exudate Amount:  Large Exudate Type: Serous Exudate Color: amber r Cleansing: No Yes Wound Bed Granulation Amount: None Present (0%) Necrotic Amount: Large (67-100%) Necrotic Quality: Adherent [address] JSHON, IBE. ([id number]) Texture Color No Abnormalities Noted: No No Abnormalities Noted: No Moisture Temperature / Pain No Abnormalities Noted: No Temperature: No Abnormality Tenderness on Palpation: Yes Wound Preparation Ulcer Cleansing: Rinsed/Irrigated with Saline Topical Anesthetic Applied: Other: lidocaine 4%, Treatment Notes Wound #2 (Left, Lateral Malleolus) 1. Cleansed with: Clean wound with Normal Saline 2. Anesthetic Topical Lidocaine 4% cream to wound bed prior to debridement 3. Peri-wound Care: Skin Prep 4. Dressing Applied: Prisma Ag 5. Secondary Dressing Applied Bordered Foam Dressing Dry Gauze Electronic Signature(s) Signed: 11/13/2016 4:28:11 PM By: Alric Quan Entered By: Alric Quan on 11/13/2016 14:52:38 [name] ([id number]) -------------------------------------------------------------------------------- [place] Details Patient Name: [name], [name]. Date of Service: 11/13/2016 2:30 PM Medical Record Number: [id number] Patient Account Number: [id number] Date of Birth/Sex: Sep 23, 1934 (82 y.o. Male) Treating RN: Ahmed Prima Primary Care Idalie Canto: [name] Other Clinician: Referring Isair Inabinet: [name] Treating Jilda Kress/Extender: Melburn Hake, HOYT Weeks in Treatment: 24 Vital Signs Time Taken: 14:44 Temperature (F): 97.7 Height (in): 69 Pulse (bpm): 62 Weight (lbs): 180 Respiratory Rate (breaths/min): 18 Body Mass Index (BMI): 26.6 Blood Pressure (mmHg): 124/57 Reference Range: 80 - 120 mg / dl Electronic Signature(s) Signed: 11/13/2016 4:28:11 PM By: Alric Quan Entered By: Alric Quan on 11/13/2016 14:46:52

## 2016-11-18 ENCOUNTER — Ambulatory Visit (INDEPENDENT_AMBULATORY_CARE_PROVIDER_SITE_OTHER): Payer: PPO | Admitting: Vascular Surgery

## 2016-11-18 ENCOUNTER — Encounter (INDEPENDENT_AMBULATORY_CARE_PROVIDER_SITE_OTHER): Payer: Self-pay | Admitting: Vascular Surgery

## 2016-11-18 ENCOUNTER — Ambulatory Visit (INDEPENDENT_AMBULATORY_CARE_PROVIDER_SITE_OTHER): Payer: PPO

## 2016-11-18 VITALS — BP 113/64 | HR 60 | Resp 17 | Ht 69.0 in | Wt 180.0 lb

## 2016-11-18 DIAGNOSIS — L97321 Non-pressure chronic ulcer of left ankle limited to breakdown of skin: Secondary | ICD-10-CM

## 2016-11-18 DIAGNOSIS — I10 Essential (primary) hypertension: Secondary | ICD-10-CM | POA: Diagnosis not present

## 2016-11-18 DIAGNOSIS — I739 Peripheral vascular disease, unspecified: Secondary | ICD-10-CM

## 2016-11-18 DIAGNOSIS — I6523 Occlusion and stenosis of bilateral carotid arteries: Secondary | ICD-10-CM

## 2016-11-18 DIAGNOSIS — I779 Disorder of arteries and arterioles, unspecified: Secondary | ICD-10-CM

## 2016-11-18 DIAGNOSIS — E785 Hyperlipidemia, unspecified: Secondary | ICD-10-CM

## 2016-11-18 DIAGNOSIS — F0390 Unspecified dementia without behavioral disturbance: Secondary | ICD-10-CM | POA: Diagnosis not present

## 2016-11-18 NOTE — Assessment & Plan Note (Signed)
His carotid duplex today reveals velocities that are basically stable, but are now in the 60-79% range on the right and then the 40-59% range on the left. Given his dementia, we have been following him on an annual basis. I think that would be reasonable to continue. Continue current medical regimen.

## 2016-11-18 NOTE — Assessment & Plan Note (Signed)
Seems to be progressing ?

## 2016-11-18 NOTE — Assessment & Plan Note (Signed)
lipid control important in reducing the progression of atherosclerotic disease.   

## 2016-11-18 NOTE — Assessment & Plan Note (Signed)
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 He has known atherosclerotic disease and it is highly likely that he has significant peripheral arterial disease that is resulting in poor wound healing. I have recommended ABIs be done in the near future at the patient's convenience. If this shows the suspected findings of significant peripheral arterial disease, I have already discussed with he and his wife that he should undergo an angiogram with possible revascularization for a critical and potentially limb threatening situation. They voiced their understanding and are agreeable to proceed with our plan of care

## 2016-11-18 NOTE — Assessment & Plan Note (Signed)
blood pressure control important in reducing the progression of atherosclerotic disease. On appropriate oral medications.  

## 2016-11-18 NOTE — Progress Notes (Signed)
MRN : [id number]  [name] is a 82 y.o. (October 21, 1934) male who presents with chief complaint of  Chief Complaint  Patient presents with  . Carotid    1 year follow up  .  History of Present Illness: Patient returns in follow up of his carotid disease.  He has no complaints today. He denies focal neurologic symptoms. Specifically, the patient denies amaurosis fugax, speech or swallowing difficulties, or arm or leg weakness or numbness. His carotid duplex today reveals velocities that are basically stable, but are now in the 60-79% range on the right and then the 40-59% range on the left. Given his dementia, we have been following him on an annual basis. A new problem has developed this year. He has a nonhealing ulceration on the left lateral ankle. This has been there for about 6 months. It does not appear particularly painful. Multiple local wound cares have been tried without success. He is really not walking much so it is difficult to assess for claudication. He has not had his lower extremity perfusion checked recently to his wife's knowledge.  Current Outpatient Prescriptions  Medication Sig Dispense Refill  . amLODipine (NORVASC) 5 MG tablet Take 5 mg by mouth daily.     . Ascorbic Acid (VITAMIN C) 1000 MG tablet Take by mouth.    Marland Kitchen aspirin EC 81 MG tablet Take 81 mg by mouth every other day.     . b complex vitamins tablet Take 1 tablet by mouth daily.    . busPIRone (BUSPAR) 10 MG tablet Take by mouth.    . cyanocobalamin 500 MCG tablet Take 500 mcg by mouth daily.    Marland Kitchen donepezil (ARICEPT) 10 MG tablet Take 10 mg by mouth at bedtime.     . famotidine (PEPCID) 20 MG tablet Take 1 tablet (20 mg total) by mouth daily.    . finasteride (PROSCAR) 5 MG tablet Take by mouth.    . Flaxseed, Linseed, (FLAX SEED OIL PO) Take 1 capsule by mouth daily.    Marland Kitchen HYDROcodone-acetaminophen (NORCO/VICODIN) 5-325 MG tablet Take 1 tablet by mouth every 6 (six) hours as needed for moderate pain. 12  tablet 0  . memantine (NAMENDA) 10 MG tablet     . metoprolol tartrate (LOPRESSOR) 25 MG tablet Take 1 tablet (25 mg total) by mouth 2 (two) times daily.    . Multiple Vitamin (MULTI-VITAMINS) TABS Take 1 tablet by mouth daily.     . Omega-3 Fatty Acids (FISH OIL) 1000 MG CAPS Take 1 capsule by mouth every other day.     . tamsulosin (FLOMAX) 0.4 MG CAPS capsule TAKE ONE CAPSULE BY MOUTH ONCE DAILY 90 capsule 1   No current facility-administered medications for this visit.     Past Medical History:  Diagnosis Date  . Absolute anemia 09/11/2014  . Anxiety   . Atrial fibrillation ([place]) 01/23/2014  . BP (high blood pressure) 11/07/2013  . Carotid artery disease ([place]) 09/11/2014  . Complete rotator cuff rupture of left shoulder 12/30/2013  . Dementia 09/11/2014  . Depression   . Heart murmur   . HLD (hyperlipidemia) 01/23/2014  . Hypertension   . Obstructive apnea 11/07/2013  . Personal history of other diseases of the circulatory system 01/23/2014  . Sleep apnea   . 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 Back Surgery- Tumor Removal    . SHOULDER SURGERY Right 2007  Social History Social History  Substance Use Topics  . Smoking status: Former Research scientist (life sciences)  . Smokeless tobacco: Never Used  . Alcohol use No  Married  Family History Family History  Problem Relation Age of Onset  . Bladder Cancer Neg Hx   . Prostate cancer Neg Hx   . Kidney cancer Neg Hx   No bleeding disorders, clotting disorders, autoimmune diseases, or aneurysms  Allergies  Allergen Reactions  . Morphine And Related Other (See Comments)    Severe confusion  . Tramadol Anxiety    Severe confusion      REVIEW OF SYSTEMS (Negative unless checked)  Constitutional: [] Weight loss  [] Fever  [] Chills Cardiac: [] Chest pain   [] Chest pressure   [x] Palpitations   [] Shortness of breath when laying flat   [] Shortness of breath at rest   [] Shortness of breath  with exertion. Vascular:  [] Pain in legs with walking   [] Pain in legs at rest   [] Pain in legs when laying flat   [] Claudication   [] Pain in feet when walking  [] Pain in feet at rest  [] Pain in feet when laying flat   [] History of DVT   [] Phlebitis   [] Swelling in legs   [] Varicose veins   [x] Non-healing ulcers Pulmonary:   [] Uses home oxygen   [] Productive cough   [] Hemoptysis   [] Wheeze  [] COPD   [] Asthma Neurologic:  [] Dizziness  [] Blackouts   [] Seizures   [] History of stroke   [] History of TIA  [] Aphasia   [] Temporary blindness   [] Dysphagia   [] Weakness or numbness in arms   [] Weakness or numbness in legs X positive for dementia Musculoskeletal:  [x] Arthritis   [] Joint swelling   [] Joint pain   [] Low back pain Hematologic:  [] Easy bruising  [] Easy bleeding   [] Hypercoagulable state   [x] Anemic  [] Hepatitis Gastrointestinal:  [] Blood in stool   [] Vomiting blood  [] Gastroesophageal reflux/heartburn   [] Difficulty swallowing. Genitourinary:  [] Chronic kidney disease   [] Difficult urination  [] Frequent urination  [] Burning with urination   [] Blood in urine Skin:  [] Rashes   [x] Ulcers   [x] Wounds Psychological:  [x] History of anxiety   [x]  History of major depression.  Physical Examination  Vitals:   11/18/16 1416 11/18/16 1417  BP: 102/60 113/64  Pulse: (!) 52 60  Resp: 17   Weight: 180 lb (81.6 kg)   Height: 5\' 9"  (1.753 m)    Body mass index is 26.58 kg/m. Gen:  WD/WN, NAD Head: [place]/AT, No temporalis wasting. Ear/Nose/Throat: Hearing grossly intact, nares w/o erythema or drainage, trachea midline Eyes: Conjunctiva clear. Sclera non-icteric Neck: Supple.  No bruit or JVD.  Pulmonary:  Good air movement, equal and clear to auscultation bilaterally.  Cardiac: Regular but slow Vascular:  Vessel Right Left  Radial Palpable Palpable                      Popliteal 1+ Palpable 1+ Palpable  PT 2+ Palpable Trace Palpable  DP 1+ Palpable 1+ Palpable   Gastrointestinal: soft,  non-tender/non-distended.  Musculoskeletal: M/S 5/5 throughout.  No deformity or atrophy. Using a wheelchair Neurologic: CN 2-12 intact. Sensation grossly intact in extremities.  Symmetrical.  Speech is difficult to understand. Motor exam as listed above. Psychiatric: Judgment and insight are poor. Patient is a poor historian. Dermatologic: Circular ulceration on the left lateral ankle about 1-2 cm in size. Mild surrounding erythema.     CBC Lab Results  Component Value Date   WBC 6.2 02/23/2016   HGB 10.7 (  L) 02/23/2016   HCT 31.2 (L) 02/23/2016   MCV 93.4 02/23/2016   PLT 184 02/23/2016    BMET    Component Value Date/Time   NA 140 02/17/2016 0415   NA 142 01/10/2014 0914   K 4.1 02/17/2016 0415   K 4.1 01/10/2014 0914   CL 113 (H) 02/17/2016 0415   CL 108 (H) 01/10/2014 0914   CO2 23 02/17/2016 0415   CO2 31 01/10/2014 0914   GLUCOSE 120 (H) 02/17/2016 0415   GLUCOSE 99 01/10/2014 0914   BUN 17 02/17/2016 0415   BUN 18 01/10/2014 0914   CREATININE 1.07 02/17/2016 0415   CREATININE 1.13 01/10/2014 0914   CALCIUM 7.5 (L) 02/17/2016 0415   CALCIUM 8.4 (L) 01/10/2014 0914   GFRNONAA >60 02/17/2016 0415   GFRNONAA >60 01/10/2014 0914   GFRAA >60 02/17/2016 0415   GFRAA >60 01/10/2014 0914   CrCl cannot be calculated (Patient's most recent lab result is older than the maximum 21 days allowed.).  COAG Lab Results  Component Value Date   INR 1.37 02/17/2016   INR 1.14 02/16/2016    Radiology No results found.    Assessment/Plan HLD (hyperlipidemia) lipid control important in reducing the progression of atherosclerotic disease.    Dementia Seems to be progressing  Carotid artery disease ([place]) His carotid duplex today reveals velocities that are basically stable, but are now in the 60-79% range on the right and then the 40-59% range on the left. Given his dementia, we have been following him on an annual basis. I think that would be reasonable to continue.  Continue current medical regimen.  BP (high blood pressure) blood pressure control important in reducing the progression of atherosclerotic disease. On appropriate oral medications.   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 He has known atherosclerotic disease and it is highly likely that he has significant peripheral arterial disease that is resulting in poor wound healing. I have recommended ABIs be done in the near future at the patient's convenience. If this shows the suspected findings of significant peripheral arterial disease, I have already discussed with he and his wife that he should undergo an angiogram with possible revascularization for a critical and potentially limb threatening situation. They voiced their understanding and are agreeable to proceed with our plan of care    Leotis Pain, MD  11/18/2016 3:05 PM    This note was created with Dragon medical transcription system.  Any errors from dictation are purely unintentional

## 2016-11-18 NOTE — Patient Instructions (Signed)

## 2016-11-20 ENCOUNTER — Ambulatory Visit (INDEPENDENT_AMBULATORY_CARE_PROVIDER_SITE_OTHER): Payer: PPO

## 2016-11-20 ENCOUNTER — Encounter: Payer: PPO | Admitting: Surgery

## 2016-11-20 ENCOUNTER — Ambulatory Visit: Payer: PPO | Admitting: Surgery

## 2016-11-20 DIAGNOSIS — L89522 Pressure ulcer of left ankle, stage 2: Secondary | ICD-10-CM | POA: Diagnosis not present

## 2016-11-20 DIAGNOSIS — L89529 Pressure ulcer of left ankle, unspecified stage: Secondary | ICD-10-CM | POA: Diagnosis not present

## 2016-11-20 DIAGNOSIS — L97321 Non-pressure chronic ulcer of left ankle limited to breakdown of skin: Secondary | ICD-10-CM

## 2016-11-21 NOTE — Progress Notes (Signed)
TARELL, SCHOLLMEYER ([id number]) Visit Report for 11/20/2016 Chief Complaint Document Details Patient Name: Jared, Ho. Date of Service: 11/20/2016 1:30 PM Medical Record Number: [id number] Patient Account Number: [id number] Date of Birth/Sex: 11-24-1934 (82 y.o. Male) Treating RN: [name] Primary Care Provider: [name] Other Clinician: Referring Provider: [name] Treating Provider/Extender: [name] in Treatment: 25 Information Obtained from: Patient Chief Complaint Patient is at the clinic for treatment of an open pressure ulcer to the left ankle Electronic Signature(s) Signed: 11/20/2016 1:55:22 PM By: Christin Fudge MD, FACS Entered By: Christin Fudge on 11/20/2016 13:55:22 [name] ([id number]) -------------------------------------------------------------------------------- HPI Details Patient Name: [name], [name]. Date of Service: 11/20/2016 1:30 PM Medical Record Number: [id number] Patient Account Number: [id number] Date of Birth/Sex: Nov 02, 1934 (82 y.o. Male) Treating RN: [name] Primary Care Provider: [name] Other Clinician: Referring Provider: [name] Treating Provider/Extender: [name] in Treatment: 25 History of Present Illness Location: left ankle laterally Quality: Patient reports experiencing a dull pain to affected area(s). Duration: Patient has had the wound for < 5 weeks prior to presenting for treatment Timing: Pain in wound is Intermittent (comes and goes Context: The wound appeared gradually over time Modifying Factors: Other treatment(s) tried include:put on Bactroban and doxycycline Associated Signs and Symptoms: Patient reports having difficulty standing for long periods. HPI Description: 82 year old gentleman who is known to have a history of dementia but is fairly functional and is able to ambulate has been having a pressure ulcer to the left ankle due to laying in bed for  longer than he should. Past history of atrial fibrillation, dementia, hyperlipidemia, hypertension. He's also had some orthopedic related surgeries in the past and was a former smoker and is given up in 1969. Recently seen by his PCP who put him on doxycycline and asked him to apply Bactroban and see [place] at the wound clinic. 06/05/2016 -- had a x-ray of the left ankle which showed no acute bony pathology. 10/16/2016 -- the patient had been recently discharged on May 24 and was doing fine. His wife noticed some drainage from the wound and hence brought him back today. Nothing else has changed in his health. 10/23/16 patient's wound appears to be doing even better on this week's evaluation in my opinion. There is just a very small opening and minimal discharge noted. He is pleased with how things have been progressing and he tells me that he tries to where the cushioned boots when he sleeps at night but that it's not the most comfortable and often he feels like the only way to get comfortable is to sleep without it. Obviously I think this can be bad for the wound as there is pressure then applied to the lateral malleolus. 11/13/16 on evaluation today patient's wound over the left ankle unfortunately has reopened. He is having some mild discomfort only but nothing significant. He has been intermittent at best in regard to wearing his cushion for nighttime to prevent pressure to the lateral ankle region. His wife tells me that she has been trying to get him to where this more regularly and wonders if that is appropriate. Electronic Signature(s) Signed: 11/20/2016 1:55:27 PM By: Christin Fudge MD, FACS Entered By: Christin Fudge on 11/20/2016 13:55:26 [name] ([id number]) -------------------------------------------------------------------------------- Physical Exam Details Patient Name: [name], [name]. Date of Service: 11/20/2016 1:30 PM Medical Record Number: [id number] Patient Account  Number: [id number] Date of Birth/Sex: October 09, 1934 (82 y.o. Male) Treating RN: [name] Primary  Care Provider: [name] Other Clinician: Referring Provider: [name] Treating Provider/Extender: [name] in Treatment: 25 Constitutional . Pulse regular. Respirations normal and unlabored. Afebrile. . Eyes Nonicteric. Reactive to light. Ears, Nose, Mouth, and Throat Lips, teeth, and gums WNL.Marland Kitchen Moist mucosa without lesions. Neck supple and nontender. No palpable supraclavicular or cervical adenopathy. Normal sized without goiter. Respiratory WNL. No retractions.. Breath sounds WNL, No rubs, rales, rhonchi, or wheeze.. Cardiovascular Heart rhythm and rate regular, no murmur or gallop.. Pedal Pulses WNL. No clubbing, cyanosis or edema. Chest Breasts symmetical and no nipple discharge.. Breast tissue WNL, no masses, lumps, or tenderness.. Lymphatic No adneopathy. No adenopathy. No adenopathy. Musculoskeletal Adexa without tenderness or enlargement.. Digits and nails w/o clubbing, cyanosis, infection, petechiae, ischemia, or inflammatory conditions.. Integumentary (Hair, Skin) No suspicious lesions. No crepitus or fluctuance. No peri-wound warmth or erythema. No masses.Marland Kitchen Psychiatric Judgement and insight Intact.. No evidence of depression, anxiety, or agitation.. Notes on the left lateral ankle on the lateral malleolus he has a pinpoint opening and there is no drainage Electronic Signature(s) Signed: 11/20/2016 1:55:48 PM By: Christin Fudge MD, FACS Entered By: Christin Fudge on 11/20/2016 13:55:48 [name] ([id number]) -------------------------------------------------------------------------------- Physician Orders Details Patient Name: [name], [name]. Date of Service: 11/20/2016 1:30 PM Medical Record Number: [id number] Patient Account Number: [id number] Date of Birth/Sex: Jan 26, 1935 (82 y.o. Male) Treating RN: [name] Primary Care Provider: [name] Other Clinician: Referring Provider: [name] Treating Provider/Extender: [name] in Treatment: 34 Verbal / Phone Orders: No Diagnosis Coding Wound Cleansing Wound #2 Left,Lateral Malleolus o Clean wound with Normal Saline. o Cleanse wound with mild soap and water o May Shower, gently pat wound dry prior to applying new dressing. Anesthetic Wound #2 Left,Lateral Malleolus o Topical Lidocaine 4% cream applied to wound bed prior to debridement Skin Barriers/Peri-Wound Care Wound #2 Left,Lateral Malleolus o Skin Prep Primary Wound Dressing Wound #2 Left,Lateral Malleolus o Prisma Ag - moisten with saline Secondary Dressing Wound #2 Left,Lateral Malleolus o Dry Gauze o Boardered Foam Dressing Dressing Change Frequency Wound #2 Left,Lateral Malleolus o Change dressing every other day. Follow-up Appointments Wound #2 Left,Lateral Malleolus o Return Appointment in 1 week. Edema Control Wound #2 Left,Lateral Malleolus o Elevate legs to the level of the heart and pump ankles as often as possible [name], [name]. ([id number]) Off-Loading Wound #2 Left,Lateral Malleolus o Turn and reposition every 2 hours o Other: - elevate heels while in bed Additional Orders / Instructions Wound #2 Left,Lateral Malleolus o Increase protein intake. Electronic Signature(s) Signed: 11/20/2016 3:35:12 PM By: [name], [name], RN, CWS, [name], [name] Signed: 11/20/2016 4:39:35 PM By: Christin Fudge MD, FACS Entered By: [name], [name], RN, CWS, Kim on 11/20/2016 13:47:40 [name], [name] ([id number]) -------------------------------------------------------------------------------- Problem List Details Patient Name: [name], [name]. Date of Service: 11/20/2016 1:30 PM Medical Record Number: [id number] Patient Account Number: [id number] Date of Birth/Sex: 1934/12/27 (82 y.o. Male) Treating RN: [name] Primary Care Provider: [name] Other  Clinician: Referring Provider: [name] Treating Provider/Extender: [name] in Treatment: 25 Active Problems ICD-10 Encounter Code Description Active Date Diagnosis [phone] Pressure ulcer of left ankle, stage 3 05/29/2016 Yes F02.80 Dementia in other diseases classified elsewhere without 05/29/2016 Yes behavioral disturbance Inactive Problems Resolved Problems Electronic Signature(s) Signed: 11/20/2016 1:55:09 PM By: Christin Fudge MD, FACS Entered By: Christin Fudge on 11/20/2016 13:55:09 [name] ([id number]) -------------------------------------------------------------------------------- Progress Note Details Patient Name: [name]. Date of Service: 11/20/2016  1:30 PM Medical Record Number: [id number] Patient Account Number: [id number] Date of Birth/Sex: [birth year] (82 y.o. Male) Treating RN: [name] Primary Care Provider: [name] Other Clinician: Referring Provider: [name] Treating Provider/Extender: [name] in Treatment: 25 Subjective Chief Complaint Information obtained from Patient Patient is at the clinic for treatment of an open pressure ulcer to the left ankle History of Present Illness (HPI) The following HPI elements were documented for the patient's wound: Location: left ankle laterally Quality: Patient reports experiencing a dull pain to affected area(s). Duration: Patient has had the wound for < 5 weeks prior to presenting for treatment Timing: Pain in wound is Intermittent (comes and goes Context: The wound appeared gradually over time Modifying Factors: Other treatment(s) tried include:put on Bactroban and doxycycline Associated Signs and Symptoms: Patient reports having difficulty standing for long periods. 82 year old gentleman who is known to have a history of dementia but is fairly functional and is able to ambulate has been having a pressure ulcer to the left ankle due to laying in bed for  longer than he should. Past history of atrial fibrillation, dementia, hyperlipidemia, hypertension. He's also had some orthopedic related surgeries in the past and was a former smoker and is given up in 1969. Recently seen by his PCP who put him on doxycycline and asked him to apply Bactroban and see [place] at the wound clinic. 06/05/2016 -- had a x-ray of the left ankle which showed no acute bony pathology. 10/16/2016 -- the patient had been recently discharged on May 24 and was doing fine. His wife noticed some drainage from the wound and hence brought him back today. Nothing else has changed in his health. 10/23/16 patient's wound appears to be doing even better on this week's evaluation in my opinion. There is just a very small opening and minimal discharge noted. He is pleased with how things have been progressing and he tells me that he tries to where the cushioned boots when he sleeps at night but that it's not the most comfortable and often he feels like the only way to get comfortable is to sleep without it. Obviously I think this can be bad for the wound as there is pressure then applied to the lateral malleolus. 11/13/16 on evaluation today patient's wound over the left ankle unfortunately has reopened. He is having some mild discomfort only but nothing significant. He has been intermittent at best in regard to wearing his cushion for nighttime to prevent pressure to the lateral ankle region. His wife tells me that she has been trying to get him to where this more regularly and wonders if that is appropriate. [name], [name]. ([id number]) Objective Constitutional Pulse regular. Respirations normal and unlabored. Afebrile. Vitals Time Taken: 1:24 PM, Height: 69 in, Weight: 180 lbs, BMI: 26.6, Temperature: 97.5 F, Pulse: 59 bpm, Respiratory Rate: 18 breaths/min, Blood Pressure: 121/56 mmHg. Eyes Nonicteric. Reactive to light. Ears, Nose, Mouth, and Throat Lips, teeth, and gums WNL.Marland Kitchen  Moist mucosa without lesions. Neck supple and nontender. No palpable supraclavicular or cervical adenopathy. Normal sized without goiter. Respiratory WNL. No retractions.. Breath sounds WNL, No rubs, rales, rhonchi, or wheeze.. Cardiovascular Heart rhythm and rate regular, no murmur or gallop.. Pedal Pulses WNL. No clubbing, cyanosis or edema. Chest Breasts symmetical and no nipple discharge.. Breast tissue WNL, no masses, lumps, or tenderness.. Lymphatic No adneopathy. No adenopathy. No adenopathy. Musculoskeletal Adexa without tenderness or enlargement.. Digits and nails w/o clubbing, cyanosis, infection, petechiae, ischemia, or inflammatory conditions.Marland Kitchen  Psychiatric Judgement and insight Intact.. No evidence of depression, anxiety, or agitation.. General Notes: on the left lateral ankle on the lateral malleolus he has a pinpoint opening and there is no drainage Integumentary (Hair, Skin) No suspicious lesions. No crepitus or fluctuance. No peri-wound warmth or erythema. No masses.Marland Kitchen [name], [name]. ([id number]) Wound #2 status is Open. Original cause of wound was Pressure Injury. The wound is located on the Left,Lateral Malleolus. The wound measures 0.3cm length x 0.3cm width x 0.1cm depth; 0.071cm^2 area and 0.007cm^3 volume. There is no tunneling or undermining noted. There is a medium amount of serous drainage noted. The wound margin is distinct with the outline attached to the wound base. There is no granulation within the wound bed. There is a large (67-100%) amount of necrotic tissue within the wound bed including Adherent Slough. The periwound skin appearance exhibited: Hemosiderin Staining. The periwound skin appearance did not exhibit: Callus, Crepitus, Excoriation, Induration, Rash, Scarring, Dry/Scaly, Maceration, Atrophie Blanche, Cyanosis, Ecchymosis, Mottled, Pallor, Rubor, Erythema. Periwound temperature was noted as No Abnormality. The periwound has tenderness on  palpation. Assessment Active Problems ICD-10 L89.523 - Pressure ulcer of left ankle, stage 3 F02.80 - Dementia in other diseases classified elsewhere without behavioral disturbance Plan Wound Cleansing: Wound #2 Left,Lateral Malleolus: Clean wound with Normal Saline. Cleanse wound with mild soap and water May Shower, gently pat wound dry prior to applying new dressing. Anesthetic: Wound #2 Left,Lateral Malleolus: Topical Lidocaine 4% cream applied to wound bed prior to debridement Skin Barriers/Peri-Wound Care: Wound #2 Left,Lateral Malleolus: Skin Prep Primary Wound Dressing: Wound #2 Left,Lateral Malleolus: Prisma Ag - moisten with saline Secondary Dressing: Wound #2 Left,Lateral Malleolus: Dry Gauze Boardered Foam Dressing Dressing Change Frequency: Wound #2 Left,Lateral Malleolus: Change dressing every other day. Follow-up Appointments: [name], [name] ([id number]) Wound #2 Left,Lateral Malleolus: Return Appointment in 1 week. Edema Control: Wound #2 Left,Lateral Malleolus: Elevate legs to the level of the heart and pump ankles as often as possible Off-Loading: Wound #2 Left,Lateral Malleolus: Turn and reposition every 2 hours Other: - elevate heels while in bed Additional Orders / Instructions: Wound #2 Left,Lateral Malleolus: Increase protein intake. the ankle is looking very good and there is not much to do except cover with some silver collagen and a bordered foam and insist on him off loading this area well. And to stand he has a arterial duplex study planned that Dr. Bunnie Domino office today -- he was seen there for an unrelated problem and due to the chronicity of this wound Dr. dew has recommended a vascular study. We will see him back next week Electronic Signature(s) Signed: 11/20/2016 1:57:24 PM By: Christin Fudge MD, FACS Entered By: Christin Fudge on 11/20/2016 13:57:24 [name]  ([id number]) -------------------------------------------------------------------------------- SuperBill Details Patient Name: [name], [name]. Date of Service: 11/20/2016 Medical Record Number: [id number] Patient Account Number: [id number] Date of Birth/Sex: 16-Aug-1934 (82 y.o. Male) Treating RN: [name] Primary Care Provider: [name] Other Clinician: Referring Provider: [name] Treating Provider/Extender: [name] in Treatment: 25 Diagnosis Coding ICD-10 Codes Code Description [phone] Pressure ulcer of left ankle, stage 3 F02.80 Dementia in other diseases classified elsewhere without behavioral disturbance Facility Procedures CPT4 Code: [id number] Description: [phone] - WOUND CARE VISIT-LEV 2 EST PT Modifier: Quantity: 1 Physician Procedures CPT4: Description Modifier Quantity Code [id number] - WC PHYS LEVEL 3 - EST PT 1 ICD-10 Description Diagnosis L89.523 Pressure ulcer of left ankle, stage 3 F02.80 Dementia in other diseases classified elsewhere without behavioral disturbance  Electronic Signature(s) Signed: 11/20/2016 1:57:33 PM By: Christin Fudge MD, FACS Entered By: Christin Fudge on 11/20/2016 13:57:33

## 2016-11-24 ENCOUNTER — Telehealth (INDEPENDENT_AMBULATORY_CARE_PROVIDER_SITE_OTHER): Payer: Self-pay | Admitting: Vascular Surgery

## 2016-11-24 NOTE — Progress Notes (Signed)
[name], [name] ([id number]) Visit Report for 11/20/2016 Arrival Information Details Patient Name: [name], [name]. Date of Service: 11/20/2016 1:30 PM Medical Record Number: [id number] Patient Account Number: [id number] Date of Birth/Sex: 06/22/1934 (82 y.o. Male) Treating Ho: [name] Primary Care [name]: [name] Other Clinician: Referring [name]: [name] Treating [name]/Extender: [name] in Treatment: 25 Visit Information History Since Last Visit Added or deleted any medications: No Patient Arrived: [name] Any new allergies or adverse reactions: No Arrival Time: 13:23 Had a fall or experienced change in No Accompanied By: wife activities of daily living that may affect Transfer Assistance: None risk of falls: Patient Identification Verified: Yes Signs or symptoms of abuse/neglect since last No Secondary Verification Process Completed: Yes visito Patient Requires Transmission-Based No Hospitalized since last visit: No Precautions: Pain Present Now: No Patient Has Alerts: Yes Patient Alerts: ASA Electronic Signature(s) Signed: 11/20/2016 3:35:12 PM By: [name], [name], Ho, CWS, [name], [name] Entered By: [name] on 11/20/2016 13:24:07 [name] ([id number]) -------------------------------------------------------------------------------- Clinic Level of Care Assessment Details Patient Name: [name], [name]. Date of Service: 11/20/2016 1:30 PM Medical Record Number: [id number] Patient Account Number: [id number] Date of Birth/Sex: 12/24/1934 (82 y.o. Male) Treating Ho: [name] Primary Care Rakan Soffer: [name] Other Clinician: Referring Keyshon Stein: [name] Treating Dafina Suk/Extender: [name] in Treatment: 25 Clinic Level of Care Assessment Items TOOL 4 Quantity Score []  - Use when only an EandM is performed on FOLLOW-UP visit 0 ASSESSMENTS - Nursing Assessment / Reassessment []  - Reassessment  of Co-morbidities (includes updates in patient status) 0 X - Reassessment of Adherence to Treatment Plan 1 5 ASSESSMENTS - Wound and Skin Assessment / Reassessment X - Simple Wound Assessment / Reassessment - one wound 1 5 []  - Complex Wound Assessment / Reassessment - multiple wounds 0 []  - Dermatologic / Skin Assessment (not related to wound area) 0 ASSESSMENTS - Focused Assessment []  - Circumferential Edema Measurements - multi extremities 0 []  - Nutritional Assessment / Counseling / Intervention 0 []  - Lower Extremity Assessment (monofilament, tuning fork, pulses) 0 []  - Peripheral Arterial Disease Assessment (using hand held doppler) 0 ASSESSMENTS - Ostomy and/or Continence Assessment and Care []  - Incontinence Assessment and Management 0 []  - Ostomy Care Assessment and Management (repouching, etc.) 0 PROCESS - Coordination of Care X - Simple Patient / Family Education for ongoing care 1 15 []  - Complex (extensive) Patient / Family Education for ongoing care 0 []  - Staff obtains Programmer, systems, Records, Test Results / Process Orders 0 []  - Staff telephones HHA, Nursing Homes / Clarify orders / etc 0 []  - Routine Transfer to another Facility (non-emergent condition) 0 [name], [name]. ([id number]) []  - Routine Hospital Admission (non-emergent condition) 0 []  - New Admissions / Biomedical engineer / Ordering NPWT, Apligraf, etc. 0 []  - Emergency Hospital Admission (emergent condition) 0 X - Simple Discharge Coordination 1 10 []  - Complex (extensive) Discharge Coordination 0 PROCESS - Special Needs []  - Pediatric / Minor Patient Management 0 []  - Isolation Patient Management 0 []  - Hearing / Language / Visual special needs 0 []  - Assessment of Community assistance (transportation, D/C planning, etc.) 0 []  - Additional assistance / Altered mentation 0 []  - Support Surface(s) Assessment (bed, cushion, seat, etc.) 0 INTERVENTIONS - Wound Cleansing / Measurement []  - Simple Wound  Cleansing - one wound 0 []  - Complex Wound Cleansing - multiple wounds 0 []  - Wound Imaging (photographs - any number of  wounds) 0 []  - Wound Tracing (instead of photographs) 0 []  - Simple Wound Measurement - one wound 0 []  - Complex Wound Measurement - multiple wounds 0 INTERVENTIONS - Wound Dressings []  - Small Wound Dressing one or multiple wounds 0 X - Medium Wound Dressing one or multiple wounds 1 15 []  - Large Wound Dressing one or multiple wounds 0 []  - Application of Medications - topical 0 []  - Application of Medications - injection 0 INTERVENTIONS - Miscellaneous []  - External ear exam 0 [name], [name]. ([id number]) []  - Specimen Collection (cultures, biopsies, blood, body fluids, etc.) 0 []  - Specimen(s) / Culture(s) sent or taken to Lab for analysis 0 []  - Patient Transfer (multiple staff / [name] / Similar devices) 0 []  - Simple Staple / Suture removal (25 or less) 0 []  - Complex Staple / Suture removal (26 or more) 0 []  - Hypo / Hyperglycemic Management (close monitor of Blood Glucose) 0 []  - Ankle / Brachial Index (ABI) - do not check if billed separately 0 X - Vital Signs 1 5 Has the patient been seen at the hospital within the last three years: Yes Total Score: 55 Level Of Care: New/Established - Level 2 Electronic Signature(s) Signed: 11/20/2016 3:35:12 PM By: [name], [name], Ho, CWS, [name], [name] Entered By: [name] on 11/20/2016 13:48:10 [name] ([id number]) -------------------------------------------------------------------------------- Encounter Discharge Information Details Patient Name: [name], [name]. Date of Service: 11/20/2016 1:30 PM Medical Record Number: [id number] Patient Account Number: [id number] Date of Birth/Sex: June 15, 1934 (82 y.o. Male) Treating Ho: [name] Primary Care [name]: [name] Other Clinician: Referring [name]: [name] Treating [name]: [name] in Treatment:  25 Encounter Discharge Information Items Discharge Pain Level: 0 Discharge Condition: Stable Ambulatory Status: Cane Discharge Destination: Home Transportation: Private Auto Accompanied By: wife Schedule Follow-up Appointment: Yes Medication Reconciliation completed Yes and provided to Patient/Care Makell Cyr: Provided on Clinical Summary of Care: 11/20/2016 Form Type Recipient Paper Patient ET Electronic Signature(s) Signed: 11/20/2016 1:55:49 PM By: [name], [name], Ho, CWS, [name], [name] Previous Signature: 11/20/2016 1:48:19 PM Version By: Ruthine Dose Entered By: [name] [name], Ho, CWS, Jared on 11/20/2016 13:55:49 [name] ([id number]) -------------------------------------------------------------------------------- Lower Extremity Assessment Details Patient Name: MACLAIN, COHRON. Date of Service: 11/20/2016 1:30 PM Medical Record Number: [id number] Patient Account Number: [id number] Date of Birth/Sex: 04-Apr-1935 (82 y.o. Male) Treating Ho: [name] Primary Care Kayley Zeiders: [name] Other Clinician: Referring Conn Trombetta: [name] Treating Lajuane Leatham/Extender: [name] in Treatment: 25 Vascular Assessment Pulses: Dorsalis Pedis Palpable: [Left:Yes] Posterior Tibial Extremity colors, hair growth, and conditions: Extremity Color: [Left:Normal] Hair Growth on Extremity: [Left:No] Temperature of Extremity: [Left:Warm] Capillary Refill: [Left:> 3 seconds] Dependent Rubor: [Left:No] Blanched when Elevated: [Left:No] Lipodermatosclerosis: [Left:No] Toe Nail Assessment Left: Right: Thick: No Discolored: No Improper Length and Hygiene: No Electronic Signature(s) Signed: 11/20/2016 3:35:12 PM By: [name], [name], Ho, CWS, [name], [name] Entered By: [name] on 11/20/2016 13:30:53 [name] ([id number]) -------------------------------------------------------------------------------- Multi Wound Chart Details Patient Name: NETHAN, CAUDILLO. Date of Service: 11/20/2016 1:30 PM Medical Record Number: [id number] Patient Account Number: [id number] Date of Birth/Sex: January 15, 1935 (82 y.o. Male) Treating Ho: [name] Primary Care Areli Jowett: [name] Other Clinician: Referring Ravindra Baranek: [name] Treating Danaka Llera/Extender: [name] in Treatment: 25 Vital Signs Height(in): 69 Pulse(bpm): 59 Weight(lbs): 180 Blood Pressure 121/56 (mmHg): Body Mass Index(BMI): 27 Temperature(F): 97.5 Respiratory Rate 18 (breaths/min): Photos: [N/A:N/A] Wound  Location: Left Malleolus - Lateral N/A N/A Wounding Event: Pressure Injury N/A N/A Primary Etiology: Pressure Ulcer N/A N/A Comorbid History: Cataracts, Anemia, Sleep N/A N/A Apnea, Arrhythmia, Hypertension, Dementia Date Acquired: 10/10/2016 N/A N/A Weeks of Treatment: 5 N/A N/A Wound Status: Open N/A N/A Measurements L x W x D 0.3x0.3x0.1 N/A N/A (cm) Area (cm) : 0.071 N/A N/A Volume (cm) : 0.007 N/A N/A % Reduction in Area: 24.50% N/A N/A % Reduction in Volume: 22.20% N/A N/A Classification: Category/[name] II N/A N/A Exudate Amount: Medium N/A N/A Exudate Type: Serous N/A N/A Exudate Color: amber N/A N/A Wound Margin: Distinct, outline attached N/A N/A Granulation Amount: None Present (0%) N/A N/A Necrotic Amount: Large (67-100%) N/A N/A Exposed Structures: Fascia: No N/A N/A Fat Layer (Subcutaneous [name], [name]. ([id number]) Tissue) Exposed: No Tendon: No Muscle: No Joint: No Bone: No Epithelialization: Large (67-100%) N/A N/A Periwound Skin Texture: Excoriation: No N/A N/A Induration: No Callus: No Crepitus: No Rash: No Scarring: No Periwound Skin Maceration: No N/A N/A Moisture: Dry/Scaly: No Periwound Skin Color: Hemosiderin Staining: Yes N/A N/A Atrophie Blanche: No Cyanosis: No Ecchymosis: No Erythema: No Mottled: No Pallor: No Rubor: No Temperature: No Abnormality N/A N/A Tenderness on Yes N/A  N/A Palpation: Wound Preparation: Ulcer Cleansing: N/A N/A Rinsed/Irrigated with Saline Topical Anesthetic Applied: Other: lidocaine 4% Treatment Notes Wound #2 (Left, Lateral Malleolus) 1. Cleansed with: Clean wound with Normal Saline 2. Anesthetic Topical Lidocaine 4% cream to wound bed prior to debridement 4. Dressing Applied: Prisma Ag 5. Secondary Dressing Applied Bordered Foam Dressing Electronic Signature(s) Signed: 11/20/2016 1:55:16 PM By: Christin Fudge MD, FACS Entered By: Christin Fudge on 11/20/2016 13:55:16 [name], [name] ([id number]) [name], [name] ([id number]) -------------------------------------------------------------------------------- [place] Details Patient Name: [name], [name]. Date of Service: 11/20/2016 1:30 PM Medical Record Number: [id number] Patient Account Number: [id number] Date of Birth/Sex: 10/27/1934 (82 y.o. Male) Treating Ho: [name] Primary Care Glinda Natzke: [name] Other Clinician: Referring Zuriel Yeaman: [name] Treating Symantha Steeber/Extender: [name] in Treatment: 25 Active Inactive ` Abuse / Safety / Falls / Self Care Management Nursing Diagnoses: Potential for falls Goals: Patient will not experience any injury related to falls Date Initiated: 10/16/2016 Target Resolution Date: 01/10/2017 Goal Status: Active Patient will remain injury free Date Initiated: 05/29/2016 Target Resolution Date: 08/02/2016 Goal Status: Active Patient will remain injury free related to falls Date Initiated: 11/13/2016 Target Resolution Date: 02/07/2017 Goal Status: Active Interventions: Assess fall risk on admission and as needed Assess impairment of mobility on admission and as needed per policy Assess personal safety and home safety (as indicated) on admission and as needed Notes: ` Nutrition Nursing Diagnoses: Imbalanced nutrition Goals: Patient/caregiver agrees to and verbalizes understanding of  need to use nutritional supplements and/or vitamins as prescribed Date Initiated: 05/29/2016 Target Resolution Date: 01/10/2017 Goal Status: Active Interventions: [name], [name] ([id number]) Assess patient nutrition upon admission and as needed per policy Notes: ` Orientation to the Wound Care Program Nursing Diagnoses: Knowledge deficit related to the wound healing center program Goals: Patient/caregiver will verbalize understanding of the [facility] Program Date Initiated: 05/29/2016 Target Resolution Date: 11/08/2016 Goal Status: Active Interventions: Provide education on orientation to the wound center Notes: ` Pain, Acute or Chronic Nursing Diagnoses: Pain, acute or chronic: actual or potential Potential alteration in comfort, pain Goals: Patient will verbalize adequate pain control and receive pain control interventions during procedures as needed Date Initiated: 05/29/2016 Target Resolution Date: 02/07/2017 Goal Status: Active Interventions: Complete pain assessment  as per visit requirements Notes: ` Wound/Skin Impairment Nursing Diagnoses: Impaired tissue integrity Knowledge deficit related to ulceration/compromised skin integrity Goals: Ulcer/skin breakdown will have a volume reduction of 80% by week 12 Date Initiated: 05/29/2016 Target Resolution Date: 02/07/2017 [name], [name] ([id number]) Goal Status: Active Interventions: Assess patient/caregiver ability to perform ulcer/skin care regimen upon admission and as needed Assess ulceration(s) every visit Notes: Electronic Signature(s) Signed: 11/20/2016 3:35:12 PM By: [name], [name], Ho, CWS, [name], [name] Entered By: [name] on 11/20/2016 13:32:39 [name], ROANHORSE ([id number]) -------------------------------------------------------------------------------- Pain Assessment Details Patient Name: VICTORHUGO, PREIS. Date of Service: 11/20/2016 1:30 PM Medical Record Number: [id number] Patient  Account Number: [id number] Date of Birth/Sex: April 16, 1935 (82 y.o. Male) Treating Ho: [name] Primary Care Dalten Ambrosino: [name] Other Clinician: Referring Ameirah Khatoon: [name] Treating Colen Eltzroth/Extender: [name] in Treatment: 25 Active Problems Location of Pain Severity and Description of Pain Patient Has Paino No Site Locations With Dressing Change: No Pain Management and Medication Current Pain Management: Goals for Pain Management Topical or injectable lidocaine is offered to patient for acute pain when surgical debridement is performed. If needed, Patient is instructed to use over the counter pain medication for the following 24-48 hours after debridement. Wound care MDs do not prescribed pain medications. Patient has chronic pain or uncontrolled pain. Patient has been instructed to make an appointment with their Primary Care Physician for pain management. Electronic Signature(s) Signed: 11/20/2016 3:35:12 PM By: [name], [name], Ho, CWS, [name], [name] Entered By: [name] on 11/20/2016 13:24:15 [name] ([id number]) -------------------------------------------------------------------------------- Patient/Caregiver Education Details Patient Name: ARIO, MCDIARMID. Date of Service: 11/20/2016 1:30 PM Medical Record Number: [id number] Patient Account Number: [id number] Date of Birth/Gender: 09/14/1934 (82 y.o. Male) Treating Ho: [name] Primary Care Physician: [name] Other Clinician: Referring Physician: Lynett Ho Treating Physician/Extender: [name] in Treatment: 25 Education Assessment Education Provided To: Patient Education Topics Provided Wound/Skin Impairment: Handouts: Caring for Your Ulcer Methods: Demonstration Responses: State content correctly Electronic Signature(s) Signed: 11/20/2016 3:35:12 PM By: [name], [name], Ho, CWS, [name], [name] Entered By: [name] on 11/20/2016  13:56:05 [name] ([id number]) -------------------------------------------------------------------------------- Wound Assessment Details Patient Name: MADYX, DELFIN. Date of Service: 11/20/2016 1:30 PM Medical Record Number: [id number] Patient Account Number: [id number] Date of Birth/Sex: 1934-12-22 (82 y.o. Male) Treating Ho: [name] Primary Care Bufford Helms: [name] Other Clinician: Referring Doral Digangi: [name] Treating Luisangel Wainright/Extender: [name] in Treatment: 25 Wound Status Wound Number: 2 Primary Pressure Ulcer Etiology: Wound Location: Left Malleolus - Lateral Wound Open Wounding Event: Pressure Injury Status: Date Acquired: 10/10/2016 Comorbid Cataracts, Anemia, Sleep Apnea, Weeks Of Treatment: 5 History: Arrhythmia, Hypertension, Dementia Clustered Wound: No Photos Wound Measurements Length: (cm) 0.3 Width: (cm) 0.3 Depth: (cm) 0.1 Area: (cm) 0.071 Volume: (cm) 0.007 % Reduction in Area: 24.5% % Reduction in Volume: 22.2% Epithelialization: Large (67-100%) Tunneling: No Undermining: No Wound Description Classification: Category/[name] II Foul Odor Afte Wound Margin: Distinct, outline attached Slough/Fibrino Exudate Amount: Medium Exudate Type: Serous Exudate Color: amber r Cleansing: No Yes Wound Bed Granulation Amount: None Present (0%) Exposed Structure Necrotic Amount: Large (67-100%) Fascia Exposed: No Necrotic Quality: Adherent Slough Fat Layer (Subcutaneous Tissue) Exposed: No Tendon Exposed: No Muscle Exposed: No Joint Exposed: No Bone Exposed: No Jakes, [place] M. ([id number]) Periwound Skin Texture Texture Color No Abnormalities Noted: No No Abnormalities Noted: No Callus: No Atrophie Blanche: No Crepitus:  No Cyanosis: No Excoriation: No Ecchymosis: No Induration: No Erythema: No Rash: No Hemosiderin Staining: Yes Scarring: No Mottled: No Pallor: No Moisture Rubor: No No Abnormalities  Noted: No Dry / Scaly: No Temperature / Pain Maceration: No Temperature: No Abnormality Tenderness on Palpation: Yes Wound Preparation Ulcer Cleansing: Rinsed/Irrigated with Saline Topical Anesthetic Applied: Other: lidocaine 4%, Treatment Notes Wound #2 (Left, Lateral Malleolus) 1. Cleansed with: Clean wound with Normal Saline 2. Anesthetic Topical Lidocaine 4% cream to wound bed prior to debridement 4. Dressing Applied: Prisma Ag 5. Secondary Dressing Applied Bordered Foam Dressing Electronic Signature(s) Signed: 11/20/2016 3:35:12 PM By: [name], [name], Ho, CWS, [name], [name] Entered By: [name] on 11/20/2016 13:30:01 SHAMOND, SKELTON ([id number]) -------------------------------------------------------------------------------- Vitals Details Patient Name: HOLLISTER, WESSLER. Date of Service: 11/20/2016 1:30 PM Medical Record Number: [id number] Patient Account Number: [id number] Date of Birth/Sex: [birth year] (82 y.o. Male) Treating Ho: [name] Primary Care Obryan Radu: [name] Other Clinician: Referring Lorely Bubb: [name] Treating Anabeth Chilcott/Extender: [name] in Treatment: 25 Vital Signs Time Taken: 13:24 Temperature (F): 97.5 Height (in): 69 Pulse (bpm): 59 Weight (lbs): 180 Respiratory Rate (breaths/min): 18 Body Mass Index (BMI): 26.6 Blood Pressure (mmHg): 121/56 Reference Range: 80 - 120 mg / dl Electronic Signature(s) Signed: 11/20/2016 3:35:12 PM By: [name], [name], Ho, CWS, [name], [name] Entered By: [name] on 11/20/2016 13:24:34

## 2016-11-24 NOTE — Telephone Encounter (Signed)
Still waiting on JD to call with results from the [facility]AP [place] that was done last week. Was told by JD that he would call Friday so that they would know whether or not to move forward with the Knee replacement. Advised wife that JD is not in office to day but I would document a message to get her response tomorrow.

## 2016-11-25 NOTE — Telephone Encounter (Signed)
I inform the patient with the results from [facility]

## 2016-11-25 NOTE — Telephone Encounter (Signed)
Arterial studies were normal.  Can proceed with knee replacement and does not need any arterial work.  Can see [place] PRN.

## 2016-11-27 ENCOUNTER — Encounter: Payer: PPO | Admitting: Surgery

## 2016-11-27 ENCOUNTER — Ambulatory Visit: Payer: PPO | Admitting: Surgery

## 2016-11-27 DIAGNOSIS — L89529 Pressure ulcer of left ankle, unspecified stage: Secondary | ICD-10-CM | POA: Diagnosis not present

## 2016-11-27 DIAGNOSIS — L89522 Pressure ulcer of left ankle, stage 2: Secondary | ICD-10-CM | POA: Diagnosis not present

## 2016-11-28 ENCOUNTER — Encounter (INDEPENDENT_AMBULATORY_CARE_PROVIDER_SITE_OTHER): Payer: Self-pay | Admitting: Vascular Surgery

## 2016-11-29 NOTE — Progress Notes (Signed)
Jared, Ho ([id number]) Visit Report for 11/27/2016 Arrival Information Details Patient Name: [name], [name]. Date of Service: 11/27/2016 2:30 PM Medical Record Number: [id number] Patient Account Number: [id number] Date of Birth/Sex: 1934/11/07 (82 y.o. Male) Treating RN: [name] Primary Care [name]: [name] Other Clinician: Referring [name]: [name] Treating [name]/Extender: [name] in Treatment: 9 Visit Information History Since Last Visit All ordered tests and consults were completed: No Patient Arrived: [name] Added or deleted any medications: No Arrival Time: 14:38 Any new allergies or adverse reactions: No Accompanied By: wife Had a fall or experienced change in No Transfer Assistance: [employer] activities of daily living that may affect Patient Lift risk of falls: Patient Identification Verified: Yes Signs or symptoms of abuse/neglect since last No Secondary Verification Process Yes visito Completed: Hospitalized since last visit: No Patient Requires Transmission- No Has Dressing in Place as Prescribed: Yes Based Precautions: Pain Present Now: No Patient Has Alerts: Yes Patient Alerts: ASA Electronic Signature(s) Signed: 11/27/2016 4:39:46 PM By: Alric Quan Entered By: Alric Quan on 11/27/2016 14:38:43 [name] ([id number]) -------------------------------------------------------------------------------- Clinic Level of Care Assessment Details Patient Name: [name]. Date of Service: 11/27/2016 2:30 PM Medical Record Number: [id number] Patient Account Number: [id number] Date of Birth/Sex: February 03, 1935 (82 y.o. Male) Treating RN: [name] Primary Care [name]: [name] Other Clinician: Referring Sahiba Granholm: [name] Treating Kinley Ferrentino/Extender: [name] in Treatment: 26 Clinic Level of Care Assessment Items TOOL 4 Quantity Score X - Use when only an EandM is  performed on FOLLOW-UP visit 1 0 ASSESSMENTS - Nursing Assessment / Reassessment X - Reassessment of Co-morbidities (includes updates in patient status) 1 10 X - Reassessment of Adherence to Treatment Plan 1 5 ASSESSMENTS - Wound and Skin Assessment / Reassessment X - Simple Wound Assessment / Reassessment - one wound 1 5 []  - Complex Wound Assessment / Reassessment - multiple wounds 0 []  - Dermatologic / Skin Assessment (not related to wound area) 0 ASSESSMENTS - Focused Assessment []  - Circumferential Edema Measurements - multi extremities 0 []  - Nutritional Assessment / Counseling / Intervention 0 []  - Lower Extremity Assessment (monofilament, tuning fork, pulses) 0 []  - Peripheral Arterial Disease Assessment (using hand held doppler) 0 ASSESSMENTS - Ostomy and/or Continence Assessment and Care []  - Incontinence Assessment and Management 0 []  - Ostomy Care Assessment and Management (repouching, etc.) 0 PROCESS - Coordination of Care X - Simple Patient / Family Education for ongoing care 1 15 []  - Complex (extensive) Patient / Family Education for ongoing care 0 []  - Staff obtains Programmer, systems, Records, Test Results / Process Orders 0 []  - Staff telephones HHA, Nursing Homes / Clarify orders / etc 0 []  - Routine Transfer to another Facility (non-emergent condition) 0 [name]. ([id number]) []  - Routine Hospital Admission (non-emergent condition) 0 []  - New Admissions / Biomedical engineer / Ordering NPWT, Apligraf, etc. 0 []  - Emergency Hospital Admission (emergent condition) 0 X - Simple Discharge Coordination 1 10 []  - Complex (extensive) Discharge Coordination 0 PROCESS - Special Needs []  - Pediatric / Minor Patient Management 0 []  - Isolation Patient Management 0 []  - Hearing / Language / Visual special needs 0 []  - Assessment of Community assistance (transportation, D/C planning, etc.) 0 []  - Additional assistance / Altered mentation 0 []  - Support Surface(s)  Assessment (bed, cushion, seat, etc.) 0 INTERVENTIONS - Wound Cleansing / Measurement X - Simple Wound Cleansing - one wound 1 5 []  - Complex Wound Cleansing -  multiple wounds 0 X - Wound Imaging (photographs - any number of wounds) 1 5 []  - Wound Tracing (instead of photographs) 0 []  - Simple Wound Measurement - one wound 0 []  - Complex Wound Measurement - multiple wounds 0 INTERVENTIONS - Wound Dressings []  - Small Wound Dressing one or multiple wounds 0 []  - Medium Wound Dressing one or multiple wounds 0 []  - Large Wound Dressing one or multiple wounds 0 []  - Application of Medications - topical 0 []  - Application of Medications - injection 0 INTERVENTIONS - Miscellaneous []  - External ear exam 0 [name]. ([id number]) []  - Specimen Collection (cultures, biopsies, blood, body fluids, etc.) 0 []  - Specimen(s) / Culture(s) sent or taken to Lab for analysis 0 []  - Patient Transfer (multiple staff / [name] / Similar devices) 0 []  - Simple Staple / Suture removal (25 or less) 0 []  - Complex Staple / Suture removal (26 or more) 0 []  - Hypo / Hyperglycemic Management (close monitor of Blood Glucose) 0 []  - Ankle / Brachial Index (ABI) - do not check if billed separately 0 X - Vital Signs 1 5 Has the patient been seen at the hospital within the last three years: Yes Total Score: 60 Level Of Care: New/Established - Level 2 Electronic Signature(s) Signed: 11/27/2016 4:39:46 PM By: Alric Quan Entered By: Alric Quan on 11/27/2016 [name], [name]. ([id number]) -------------------------------------------------------------------------------- Encounter Discharge Information Details Patient Name: [name]. Date of Service: 11/27/2016 2:30 PM Medical Record Number: [id number] Patient Account Number: [id number] Date of Birth/Sex: 1934/10/25 (82 y.o. Male) Treating RN: [name] Primary Care [name]: [name] Other Clinician: Referring  [name]: [name] Treating [name]/Extender: [name] in Treatment: 29 Encounter Discharge Information Items Discharge Pain Level: 0 Discharge Condition: Stable Ambulatory Status: Cane Discharge Destination: Home Transportation: Private Auto Accompanied By: wife Schedule Follow-up Appointment: No Medication Reconciliation completed No and provided to Patient/Care Ranvir Renovato: Provided on Clinical Summary of Care: 11/27/2016 Form Type Recipient Paper Patient ET Electronic Signature(s) Signed: 11/27/2016 2:55:57 PM By: Ruthine Dose Entered By: Ruthine Dose on 11/27/2016 14:55:57 [name] ([id number]) -------------------------------------------------------------------------------- Lower Extremity Assessment Details Patient Name: [name]. Date of Service: 11/27/2016 2:30 PM Medical Record Number: [id number] Patient Account Number: [id number] Date of Birth/Sex: 1934-08-26 (82 y.o. Male) Treating RN: [name] Primary Care Alexandar Weisenberger: [name] Other Clinician: Referring Thelbert Gartin: [name] Treating Zadrian Mccauley/Extender: [name] in Treatment: 26 Vascular Assessment Pulses: Dorsalis Pedis Palpable: [Left:Yes] Posterior Tibial Extremity colors, hair growth, and conditions: Extremity Color: [Left:Normal] Temperature of Extremity: [Left:Warm] Capillary Refill: [Left:> 3 seconds] Toe Nail Assessment Left: Right: Thick: No Discolored: No Deformed: No Improper Length and Hygiene: No Electronic Signature(s) Signed: 11/27/2016 4:39:46 PM By: Alric Quan Entered By: Alric Quan on 11/27/2016 14:49:15 Ho, Jared Au ([id number]) -------------------------------------------------------------------------------- Multi Wound Chart Details Patient Name: [name]. Date of Service: 11/27/2016 2:30 PM Medical Record Number: [id number] Patient Account Number: [id number] Date of Birth/Sex: 01/22/1935 (82 y.o.  Male) Treating RN: [name] Primary Care Maeleigh Buschman: [name] Other Clinician: Referring Noreta Kue: [name] Treating Rhylen Pulido/Extender: [name] in Treatment: 26 Vital Signs Height(in): 69 Pulse(bpm): 67 Weight(lbs): 180 Blood Pressure 137/69 (mmHg): Body Mass Index(BMI): 27 Temperature(F): Respiratory Rate 18 (breaths/min): Photos: [2:No Photos] [N/A:N/A] Wound Location: [2:Left Malleolus - Lateral] [N/A:N/A] Wounding Event: [2:Pressure Injury] [N/A:N/A] Primary Etiology: [2:Pressure Ulcer] [N/A:N/A] Comorbid History: [2:Cataracts, Anemia, Sleep Apnea, Arrhythmia, Hypertension, Dementia] [N/A:N/A] Date Acquired: [2:10/10/2016] [N/A:N/A] Weeks of  Treatment: [2:6] [N/A:N/A] Wound Status: [2:Open] [N/A:N/A] Measurements L x W x D 0x0x0 [N/A:N/A] (cm) Area (cm) : [2:0] [N/A:N/A] Volume (cm) : [2:0] [N/A:N/A] % Reduction in Area: [2:100.00%] [N/A:N/A] % Reduction in Volume: 100.00% [N/A:N/A] Classification: [2:Category/Stage II] [N/A:N/A] Exudate Amount: [2:None Present] [N/A:N/A] Wound Margin: [2:Distinct, outline attached] [N/A:N/A] Granulation Amount: [2:None Present (0%)] [N/A:N/A] Necrotic Amount: [2:None Present (0%)] [N/A:N/A] Exposed Structures: [2:Fascia: No Fat Layer (Subcutaneous Tissue) Exposed: No Tendon: No Muscle: No Joint: No Bone: No] [N/A:N/A] Epithelialization: [2:Large (67-100%)] [N/A:N/A] Periwound Skin Texture: [N/A:N/A] Excoriation: No Induration: No Callus: No Crepitus: No Rash: No Scarring: No Periwound Skin Maceration: No N/A N/A Moisture: Dry/Scaly: No Periwound Skin Color: Hemosiderin Staining: Yes N/A N/A Atrophie Blanche: No Cyanosis: No Ecchymosis: No Erythema: No Mottled: No Pallor: No Rubor: No Temperature: No Abnormality N/A N/A Tenderness on Yes N/A N/A Palpation: Wound Preparation: Ulcer Cleansing: N/A N/A Rinsed/Irrigated with Saline Topical Anesthetic Applied: None Treatment  Notes Electronic Signature(s) Signed: 11/27/2016 2:55:34 PM By: Christin Fudge MD, FACS Entered By: Christin Fudge on 11/27/2016 14:55:34 [name] ([id number]) -------------------------------------------------------------------------------- [place] Details Patient Name: [name], [name]. Date of Service: 11/27/2016 2:30 PM Medical Record Number: [id number] Patient Account Number: [id number] Date of Birth/Sex: 07-06-1934 (82 y.o. Male) Treating RN: [name] Primary Care Ebony Rickel: [name] Other Clinician: Referring Jairy Angulo: [name] Treating Leatrice Parilla/Extender: [name] in Treatment: 49 Active Inactive Electronic Signature(s) Signed: 11/27/2016 4:39:46 PM By: Alric Quan Entered By: Alric Quan on 11/27/2016 14:49:39 [name] ([id number]) -------------------------------------------------------------------------------- Pain Assessment Details Patient Name: [name], [name]. Date of Service: 11/27/2016 2:30 PM Medical Record Number: [id number] Patient Account Number: [id number] Date of Birth/Sex: 1934/07/29 (82 y.o. Male) Treating RN: [name] Primary Care Shaylan Tutton: [name] Other Clinician: Referring Alanea Woolridge: [name] Treating Jasson Siegmann/Extender: [name] in Treatment: 34 Active Problems Location of Pain Severity and Description of Pain Patient Has Paino No Site Locations With Dressing Change: No Pain Management and Medication Current Pain Management: Electronic Signature(s) Signed: 11/27/2016 4:39:46 PM By: Alric Quan Entered By: Alric Quan on 11/27/2016 14:38:51 [name] ([id number]) -------------------------------------------------------------------------------- Patient/Caregiver Education Details Patient Name: [name], [name]. Date of Service: 11/27/2016 2:30 PM Medical Record Number: [id number] Patient Account Number: [id number] Date of  Birth/Gender: 12-19-1934 (82 y.o. Male) Treating RN: [name] Primary Care Physician: [name] Other Clinician: Referring Physician: Lynett Ho Treating Physician/Extender: [name] in Treatment: 49 Education Assessment Education Provided To: Patient and Caregiver wife Education Topics Provided Wound/Skin Impairment: Handouts: Other: Please call our office if you have any questions or concerns. Methods: Explain/Verbal Responses: State content correctly Electronic Signature(s) Signed: 11/27/2016 4:39:46 PM By: Alric Quan Entered By: Alric Quan on 11/27/2016 14:51:17 [name] ([id number]) -------------------------------------------------------------------------------- Wound Assessment Details Patient Name: [name], [name]. Date of Service: 11/27/2016 2:30 PM Medical Record Number: [id number] Patient Account Number: [id number] Date of Birth/Sex: [birth year] (82 y.o. Male) Treating RN: [name] Primary Care Flora Parks: [name] Other Clinician: Referring Tynasia Mccaul: [name] Treating Nkenge Sonntag/Extender: [name] in Treatment: 26 Wound Status Wound Number: 2 Primary Pressure Ulcer Etiology: Wound Location: Left Malleolus - Lateral Wound Open Wounding Event: Pressure Injury Status: Date Acquired: 10/10/2016 Comorbid Cataracts, Anemia, Sleep Apnea, Weeks Of Treatment: 6 History: Arrhythmia, Hypertension, Dementia Clustered Wound: No Photos Photo Uploaded By: Alric Quan on 11/27/2016 15:02:29 Wound Measurements Length: (cm) 0 % Reductio Width: (cm) 0 % Reductio Depth: (cm) 0 Epithelial Area: (cm) 0 Tunneling Volume: (  cm) 0 Undermini n in Area: 100% n in Volume: 100% ization: Large (67-100%) : No ng: No Wound Description Classification: Category/Stage II Wound Margin: Distinct, outline attached Exudate Amount: None Present Foul Odor After Cleansing: No Slough/Fibrino No Wound  Bed Granulation Amount: None Present (0%) Exposed Structure Necrotic Amount: None Present (0%) Fascia Exposed: No Fat Layer (Subcutaneous Tissue) Exposed: No Tendon Exposed: No Muscle Exposed: No Joint Exposed: No [name], [name] ([id number]) Bone Exposed: No Periwound Skin Texture Texture Color No Abnormalities Noted: No No Abnormalities Noted: No Callus: No Atrophie Blanche: No Crepitus: No Cyanosis: No Excoriation: No Ecchymosis: No Induration: No Erythema: No Rash: No Hemosiderin Staining: Yes Scarring: No Mottled: No Pallor: No Moisture Rubor: No No Abnormalities Noted: No Dry / Scaly: No Temperature / Pain Maceration: No Temperature: No Abnormality Tenderness on Palpation: Yes Wound Preparation Ulcer Cleansing: Rinsed/Irrigated with Saline Topical Anesthetic Applied: None Electronic Signature(s) Signed: 11/27/2016 4:39:46 PM By: Alric Quan Entered By: Alric Quan on 11/27/2016 14:48:40 [name] ([id number]) -------------------------------------------------------------------------------- Vitals Details Patient Name: [name]. Date of Service: 11/27/2016 2:30 PM Medical Record Number: [id number] Patient Account Number: [id number] Date of Birth/Sex: 1934/10/02 (82 y.o. Male) Treating RN: [name] Primary Care Malaia Buchta: [name] Other Clinician: Referring Shaquaya Wuellner: [name] Treating Tenaya Hilyer/Extender: [name] in Treatment: 26 Vital Signs Time Taken: 14:38 Pulse (bpm): 67 Height (in): 69 Respiratory Rate (breaths/min): 18 Weight (lbs): 180 Blood Pressure (mmHg): 137/69 Body Mass Index (BMI): 26.6 Reference Range: 80 - 120 mg / dl Electronic Signature(s) Signed: 11/27/2016 4:39:46 PM By: Alric Quan Entered By: Alric Quan on 11/27/2016 14:40:37

## 2016-12-01 NOTE — Progress Notes (Addendum)
HAI, GRABE ([id number]) Visit Report for 11/27/2016 Chief Complaint Document Details Patient Name: [name], [name]. Date of Service: 11/27/2016 2:30 PM Medical Record Number: [id number] Patient Account Number: [id number] Date of Birth/Sex: [birth year] (82 y.o. Male) Treating RN: Ahmed Prima Primary Care Provider: [name] Other Clinician: Referring Provider: [name] Treating Provider/Extender: [name] in Treatment: 26 Information Obtained from: Patient Chief Complaint Patient is at the clinic for treatment of an open pressure ulcer to the left ankle Electronic Signature(s) Signed: 11/27/2016 2:55:44 PM By: Christin Fudge MD, FACS Entered By: Christin Fudge on 11/27/2016 14:55:43 [name] ([id number]) -------------------------------------------------------------------------------- HPI Details Patient Name: [name], [name]. Date of Service: 11/27/2016 2:30 PM Medical Record Number: [id number] Patient Account Number: [id number] Date of Birth/Sex: 06-02-1934 (82 y.o. Male) Treating RN: Ahmed Prima Primary Care Provider: [name] Other Clinician: Referring Provider: [name] Treating Provider/Extender: [name] in Treatment: 26 History of Present Illness Location: left ankle laterally Quality: Patient reports experiencing a dull pain to affected area(s). Duration: Patient has had the wound for < 5 weeks prior to presenting for treatment Timing: Pain in wound is Intermittent (comes and goes Context: The wound appeared gradually over time Modifying Factors: Other treatment(s) tried include:put on Bactroban and doxycycline Associated Signs and Symptoms: Patient reports having difficulty standing for long periods. HPI Description: 82 year old gentleman who is known to have a history of dementia but is fairly functional and is able to ambulate has been having a pressure ulcer to the left ankle due to laying in bed for  longer than he should. Past history of atrial fibrillation, dementia, hyperlipidemia, hypertension. He's also had some orthopedic related surgeries in the past and was a former smoker and is given up in 1969. Recently seen by his PCP who put him on doxycycline and asked him to apply Bactroban and see [place] at the wound clinic. 06/05/2016 -- had a x-ray of the left ankle which showed no acute bony pathology. 10/16/2016 -- the patient had been recently discharged on May 24 and was doing fine. His wife noticed some drainage from the wound and hence brought him back today. Nothing else has changed in his health. 10/23/16 patient's wound appears to be doing even better on this week's evaluation in my opinion. There is just a very small opening and minimal discharge noted. He is pleased with how things have been progressing and he tells me that he tries to where the cushioned boots when he sleeps at night but that it's not the most comfortable and often he feels like the only way to get comfortable is to sleep without it. Obviously I think this can be bad for the wound as there is pressure then applied to the lateral malleolus. 11/13/16 on evaluation today patient's wound over the left ankle unfortunately has reopened. He is having some mild discomfort only but nothing significant. He has been intermittent at best in regard to wearing his cushion for nighttime to prevent pressure to the lateral ankle region. His wife tells me that she has been trying to get him to where this more regularly and wonders if that is appropriate. 11/27/2016 -- he was seen by Dr. Corene Cornea dew on 11/18/2016 and he had noted the chronic ulceration on the left lateral ankle and recommended arterial studies. on 11/24/2016 he noted that the arterial studies were normal and the patient can proceed with knee replacement and does not need any arterial work. Electronic Signature(s) Signed: 11/27/2016 2:55:49 PM  By: Christin Fudge MD,  FACS Previous Signature: 11/27/2016 2:55:18 PM Version By: Christin Fudge MD, FACS Previous Signature: 11/27/2016 2:43:11 PM Version By: Christin Fudge MD, FACS Entered By: Christin Fudge on 11/27/2016 14:55:49 EUSTACIO, ELLEN ([id number], GROB ([id number]) -------------------------------------------------------------------------------- Physical Exam Details Patient Name: [name], [name]. Date of Service: 11/27/2016 2:30 PM Medical Record Number: [id number] Patient Account Number: [id number] Date of Birth/Sex: 06-30-1934 (82 y.o. Male) Treating RN: Ahmed Prima Primary Care Provider: [name] Other Clinician: Referring Provider: [name] Treating Provider/Extender: [name] in Treatment: 26 Constitutional . Pulse regular. Respirations normal and unlabored. Afebrile. . Eyes Nonicteric. Reactive to light. Ears, Nose, Mouth, and Throat Lips, teeth, and gums WNL.Marland Kitchen Moist mucosa without lesions. Neck supple and nontender. No palpable supraclavicular or cervical adenopathy. Normal sized without goiter. Respiratory WNL. No retractions.. Breath sounds WNL, No rubs, rales, rhonchi, or wheeze.. Cardiovascular Heart rhythm and rate regular, no murmur or gallop.. Pedal Pulses WNL. No clubbing, cyanosis or edema. Chest Breasts symmetical and no nipple discharge.. Breast tissue WNL, no masses, lumps, or tenderness.. Lymphatic No adneopathy. No adenopathy. No adenopathy. Musculoskeletal Adexa without tenderness or enlargement.. Digits and nails w/o clubbing, cyanosis, infection, petechiae, ischemia, or inflammatory conditions.. Integumentary (Hair, Skin) No suspicious lesions. No crepitus or fluctuance. No peri-wound warmth or erythema. No masses.Marland Kitchen Psychiatric Judgement and insight Intact.. No evidence of depression, anxiety, or agitation.. Notes the wound on the left ankle and lateral malleolus is completely healed. Electronic Signature(s) Signed:  11/27/2016 2:56:19 PM By: Christin Fudge MD, FACS Entered By: Christin Fudge on 11/27/2016 14:56:18 [name] ([id number]) -------------------------------------------------------------------------------- Physician Orders Details Patient Name: [name], [name]. Date of Service: 11/27/2016 2:30 PM Medical Record Number: [id number] Patient Account Number: [id number] Date of Birth/Sex: 08-18-1934 (82 y.o. Male) Treating RN: Ahmed Prima Primary Care Provider: [name] Other Clinician: Referring Provider: [name] Treating Provider/Extender: [name] in Treatment: 52 Verbal / Phone Orders: Yes Clinician: Carolyne Fiscal, Debi Read Back and Verified: Yes Diagnosis Coding Discharge From [facility] Services o Discharge from [place] area protected. Keep pressure off of area. Please call our office if you have any questions or concerns. Electronic Signature(s) Signed: 11/27/2016 3:53:48 PM By: Christin Fudge MD, FACS Signed: 11/27/2016 4:39:46 PM By: Alric Quan Entered By: Alric Quan on 11/27/2016 14:50:36 [name] ([id number]) -------------------------------------------------------------------------------- Problem List Details Patient Name: ULYSEE, FYOCK. Date of Service: 11/27/2016 2:30 PM Medical Record Number: [id number] Patient Account Number: [id number] Date of Birth/Sex: Jul 16, 1934 (82 y.o. Male) Treating RN: Ahmed Prima Primary Care Provider: [name] Other Clinician: Referring Provider: [name] Treating Provider/Extender: [name] in Treatment: 63 Active Problems ICD-10 Encounter Code Description Active Date Diagnosis [phone] Pressure ulcer of left ankle, stage 3 05/29/2016 Yes F02.80 Dementia in other diseases classified elsewhere without 05/29/2016 Yes behavioral disturbance Inactive Problems Resolved Problems Electronic Signature(s) Signed: 11/27/2016 2:55:29 PM By: Christin Fudge MD,  FACS Entered By: Christin Fudge on 11/27/2016 14:55:28 [name] ([id number]) -------------------------------------------------------------------------------- Progress Note Details Patient Name: [name], [name]. Date of Service: 11/27/2016 2:30 PM Medical Record Number: [id number] Patient Account Number: [id number] Date of Birth/Sex: 1934/06/07 (82 y.o. Male) Treating RN: Ahmed Prima Primary Care Provider: [name] Other Clinician: Referring Provider: [name] Treating Provider/Extender: [name] in Treatment: 42 Subjective Chief Complaint Information obtained from Patient Patient is at the clinic for treatment of an open pressure ulcer to the left ankle History of Present  Illness (HPI) The following HPI elements were documented for the patient's wound: Location: left ankle laterally Quality: Patient reports experiencing a dull pain to affected area(s). Duration: Patient has had the wound for < 5 weeks prior to presenting for treatment Timing: Pain in wound is Intermittent (comes and goes Context: The wound appeared gradually over time Modifying Factors: Other treatment(s) tried include:put on Bactroban and doxycycline Associated Signs and Symptoms: Patient reports having difficulty standing for long periods. 82 year old gentleman who is known to have a history of dementia but is fairly functional and is able to ambulate has been having a pressure ulcer to the left ankle due to laying in bed for longer than he should. Past history of atrial fibrillation, dementia, hyperlipidemia, hypertension. He's also had some orthopedic related surgeries in the past and was a former smoker and is given up in 1969. Recently seen by his PCP who put him on doxycycline and asked him to apply Bactroban and see [place] at the wound clinic. 06/05/2016 -- had a x-ray of the left ankle which showed no acute bony pathology. 10/16/2016 -- the patient had been recently discharged  on May 24 and was doing fine. His wife noticed some drainage from the wound and hence brought him back today. Nothing else has changed in his health. 10/23/16 patient's wound appears to be doing even better on this week's evaluation in my opinion. There is just a very small opening and minimal discharge noted. He is pleased with how things have been progressing and he tells me that he tries to where the cushioned boots when he sleeps at night but that it's not the most comfortable and often he feels like the only way to get comfortable is to sleep without it. Obviously I think this can be bad for the wound as there is pressure then applied to the lateral malleolus. 11/13/16 on evaluation today patient's wound over the left ankle unfortunately has reopened. He is having some mild discomfort only but nothing significant. He has been intermittent at best in regard to wearing his cushion for nighttime to prevent pressure to the lateral ankle region. His wife tells me that she has been trying to get him to where this more regularly and wonders if that is appropriate. 11/27/2016 -- he was seen by Dr. Corene Cornea dew on 11/18/2016 and he had noted the chronic ulceration on the left lateral ankle and recommended arterial studies. on 11/24/2016 he noted that the arterial studies were [name], [name]. ([id number]) normal and the patient can proceed with knee replacement and does not need any arterial work. Objective Constitutional Pulse regular. Respirations normal and unlabored. Afebrile. Vitals Time Taken: 2:38 PM, Height: 69 in, Weight: 180 lbs, BMI: 26.6, Pulse: 67 bpm, Respiratory Rate: 18 breaths/min, Blood Pressure: 137/69 mmHg. Eyes Nonicteric. Reactive to light. Ears, Nose, Mouth, and Throat Lips, teeth, and gums WNL.Marland Kitchen Moist mucosa without lesions. Neck supple and nontender. No palpable supraclavicular or cervical adenopathy. Normal sized without goiter. Respiratory WNL. No retractions.. Breath  sounds WNL, No rubs, rales, rhonchi, or wheeze.. Cardiovascular Heart rhythm and rate regular, no murmur or gallop.. Pedal Pulses WNL. No clubbing, cyanosis or edema. Chest Breasts symmetical and no nipple discharge.. Breast tissue WNL, no masses, lumps, or tenderness.. Lymphatic No adneopathy. No adenopathy. No adenopathy. Musculoskeletal Adexa without tenderness or enlargement.. Digits and nails w/o clubbing, cyanosis, infection, petechiae, ischemia, or inflammatory conditions.Marland Kitchen Psychiatric Judgement and insight Intact.. No evidence of depression, anxiety, or agitation.. General Notes: the wound on the left  ankle and lateral malleolus is completely healed. [name], [name]. ([id number]) Integumentary (Hair, Skin) No suspicious lesions. No crepitus or fluctuance. No peri-wound warmth or erythema. No masses.. Wound #2 status is Open. Original cause of wound was Pressure Injury. The wound is located on the Left,Lateral Malleolus. The wound measures 0cm length x 0cm width x 0cm depth; 0cm^2 area and 0cm^3 volume. There is no tunneling or undermining noted. There is a none present amount of drainage noted. The wound margin is distinct with the outline attached to the wound base. There is no granulation within the wound bed. There is no necrotic tissue within the wound bed. The periwound skin appearance exhibited: Hemosiderin Staining. The periwound skin appearance did not exhibit: Callus, Crepitus, Excoriation, Induration, Rash, Scarring, Dry/Scaly, Maceration, Atrophie Blanche, Cyanosis, Ecchymosis, Mottled, Pallor, Rubor, Erythema. Periwound temperature was noted as No Abnormality. The periwound has tenderness on palpation. Assessment Active Problems ICD-10 L89.523 - Pressure ulcer of left ankle, stage 3 F02.80 - Dementia in other diseases classified elsewhere without behavioral disturbance Plan Discharge From [facility] Services: Discharge from [place] area protected. Keep  pressure off of area. Please call our office if you have any questions or concerns. the wound is healed and I have discussed at length with the patient and his wife wound care and protective foam dressing. At night he should wear a protective boot to prevent him from having pressure on this lateral ankle. He is discharged from the wound care services and be seen back only if needed Electronic Signature(s) [name], [name] ([id number]) Signed: 11/27/2016 2:56:56 PM By: Christin Fudge MD, FACS Entered By: Christin Fudge on 11/27/2016 14:56:56 [name] ([id number]) -------------------------------------------------------------------------------- SuperBill Details Patient Name: [name], [name]. Date of Service: 11/27/2016 Medical Record Number: [id number] Patient Account Number: [id number] Date of Birth/Sex: 30-Aug-1934 (82 y.o. Male) Treating RN: Ahmed Prima Primary Care Provider: [name] Other Clinician: Referring Provider: [name] Treating Provider/Extender: [name] in Treatment: 26 Diagnosis Coding ICD-10 Codes Code Description [phone] Pressure ulcer of left ankle, stage 3 F02.80 Dementia in other diseases classified elsewhere without behavioral disturbance Facility Procedures CPT4 Code: [id number] Description: [phone] - WOUND CARE VISIT-LEV 2 EST PT Modifier: Quantity: 1 Physician Procedures CPT4: Description Modifier Quantity Code [phone] [id number] - WC PHYS LEVEL 3 - EST PT 1 ICD-10 Description Diagnosis L89.523 Pressure ulcer of left ankle, stage 3 F02.80 Dementia in other diseases classified elsewhere without behavioral disturbance Electronic Signature(s) Signed: 11/27/2016 3:53:48 PM By: Christin Fudge MD, FACS Signed: 11/27/2016 4:39:46 PM By: Alric Quan Previous Signature: 11/27/2016 2:57:07 PM Version By: Christin Fudge MD, FACS Entered By: Alric Quan on 11/27/2016 15:01:02

## 2016-12-03 ENCOUNTER — Other Ambulatory Visit: Payer: Self-pay | Admitting: Urology

## 2016-12-03 DIAGNOSIS — N400 Enlarged prostate without lower urinary tract symptoms: Secondary | ICD-10-CM

## 2016-12-03 NOTE — Telephone Encounter (Signed)
Attempted to call pt to make aware needs a 1 year f/u. No answer.

## 2016-12-04 ENCOUNTER — Encounter: Payer: PPO | Admitting: Surgery

## 2016-12-07 ENCOUNTER — Other Ambulatory Visit: Payer: Self-pay | Admitting: Urology

## 2016-12-07 DIAGNOSIS — N400 Enlarged prostate without lower urinary tract symptoms: Secondary | ICD-10-CM

## 2016-12-11 ENCOUNTER — Encounter: Payer: PPO | Admitting: Physician Assistant

## 2016-12-11 DIAGNOSIS — M1712 Unilateral primary osteoarthritis, left knee: Secondary | ICD-10-CM | POA: Diagnosis not present

## 2016-12-15 DIAGNOSIS — I10 Essential (primary) hypertension: Secondary | ICD-10-CM | POA: Diagnosis not present

## 2016-12-15 DIAGNOSIS — D693 Immune thrombocytopenic purpura: Secondary | ICD-10-CM | POA: Diagnosis not present

## 2016-12-18 DIAGNOSIS — F0150 Vascular dementia without behavioral disturbance: Secondary | ICD-10-CM | POA: Diagnosis not present

## 2016-12-18 DIAGNOSIS — I482 Chronic atrial fibrillation: Secondary | ICD-10-CM | POA: Diagnosis not present

## 2016-12-18 DIAGNOSIS — Z8659 Personal history of other mental and behavioral disorders: Secondary | ICD-10-CM | POA: Diagnosis not present

## 2016-12-18 DIAGNOSIS — R2681 Unsteadiness on feet: Secondary | ICD-10-CM | POA: Diagnosis not present

## 2016-12-18 DIAGNOSIS — G4733 Obstructive sleep apnea (adult) (pediatric): Secondary | ICD-10-CM | POA: Diagnosis not present

## 2016-12-18 DIAGNOSIS — F0280 Dementia in other diseases classified elsewhere without behavioral disturbance: Secondary | ICD-10-CM | POA: Diagnosis not present

## 2016-12-18 DIAGNOSIS — G309 Alzheimer's disease, unspecified: Secondary | ICD-10-CM | POA: Diagnosis not present

## 2016-12-22 ENCOUNTER — Other Ambulatory Visit: Payer: PPO

## 2016-12-22 DIAGNOSIS — I482 Chronic atrial fibrillation: Secondary | ICD-10-CM | POA: Diagnosis not present

## 2016-12-22 DIAGNOSIS — G4733 Obstructive sleep apnea (adult) (pediatric): Secondary | ICD-10-CM | POA: Diagnosis not present

## 2016-12-22 DIAGNOSIS — D649 Anemia, unspecified: Secondary | ICD-10-CM | POA: Diagnosis not present

## 2016-12-22 DIAGNOSIS — I10 Essential (primary) hypertension: Secondary | ICD-10-CM | POA: Diagnosis not present

## 2016-12-22 DIAGNOSIS — Z9989 Dependence on other enabling machines and devices: Secondary | ICD-10-CM | POA: Diagnosis not present

## 2016-12-24 ENCOUNTER — Encounter
Admission: RE | Admit: 2016-12-24 | Discharge: 2016-12-24 | Disposition: A | Discharge status: Home / Self Care | Payer: PPO | Source: Ambulatory Visit | Attending: Orthopedic Surgery | Admitting: Orthopedic Surgery

## 2016-12-24 DIAGNOSIS — Z01818 Encounter for other preprocedural examination: Secondary | ICD-10-CM | POA: Insufficient documentation

## 2016-12-24 DIAGNOSIS — M1712 Unilateral primary osteoarthritis, left knee: Secondary | ICD-10-CM | POA: Diagnosis not present

## 2016-12-24 HISTORY — DX: Unspecified osteoarthritis, unspecified site: M19.90

## 2016-12-24 LAB — COMPREHENSIVE METABOLIC PANEL
ALT: 14 U/L — ABNORMAL LOW (ref 17–63)
ANION GAP: 8 (ref 5–15)
AST: 22 U/L (ref 15–41)
Albumin: 4.3 g/dL (ref 3.5–5.0)
Alkaline Phosphatase: 51 U/L (ref 38–126)
BILIRUBIN TOTAL: 0.6 mg/dL (ref 0.3–1.2)
BUN: 23 mg/dL — ABNORMAL HIGH (ref 6–20)
CHLORIDE: 105 mmol/L (ref 101–111)
CO2: 31 mmol/L (ref 22–32)
Calcium: 9.3 mg/dL (ref 8.9–10.3)
Creatinine, Ser: 1.24 mg/dL (ref 0.61–1.24)
GFR, EST NON AFRICAN AMERICAN: 53 mL/min — AB (ref >60)
Glucose, Bld: 100 mg/dL — ABNORMAL HIGH (ref 65–99)
POTASSIUM: 3.9 mmol/L (ref 3.5–5.1)
Sodium: 144 mmol/L (ref 135–145)
Total Protein: 7.4 g/dL (ref 6.5–8.1)

## 2016-12-24 LAB — URINALYSIS, ROUTINE W REFLEX MICROSCOPIC
Bilirubin Urine: NEGATIVE
GLUCOSE, UA: NEGATIVE mg/dL
HGB URINE DIPSTICK: NEGATIVE
Ketones, ur: NEGATIVE mg/dL
Leukocytes, UA: NEGATIVE
Nitrite: NEGATIVE
PROTEIN: NEGATIVE mg/dL
Specific Gravity, Urine: 1.024 (ref 1.005–1.030)
pH: 5 (ref 5.0–8.0)

## 2016-12-24 LAB — CBC
HEMATOCRIT: 40.4 % (ref 40.0–52.0)
Hemoglobin: 13.8 g/dL (ref 13.0–18.0)
MCH: 33 pg (ref 26.0–34.0)
MCHC: 34.2 g/dL (ref 32.0–36.0)
MCV: 96.4 fL (ref 80.0–100.0)
PLATELETS: 174 10*3/uL (ref 150–440)
RBC: 4.19 MIL/uL — ABNORMAL LOW (ref 4.40–5.90)
RDW: 13 % (ref 11.5–14.5)
WBC: 6.6 10*3/uL (ref 3.8–10.6)

## 2016-12-24 LAB — TYPE AND SCREEN
ABO/RH(D): A NEG
ANTIBODY SCREEN: NEGATIVE

## 2016-12-24 LAB — PROTIME-INR
INR: 1.26
Prothrombin Time: 15.9 seconds — ABNORMAL HIGH (ref 11.4–15.2)

## 2016-12-24 LAB — SURGICAL PCR SCREEN
MRSA, PCR: NEGATIVE
STAPHYLOCOCCUS AUREUS: POSITIVE — AB

## 2016-12-24 LAB — C-REACTIVE PROTEIN: CRP: <0.8 mg/dL (ref <1.0)

## 2016-12-24 LAB — APTT: aPTT: 29 seconds (ref 24–36)

## 2016-12-24 LAB — SEDIMENTATION RATE: Sed Rate: 8 mm/hr (ref 0–20)

## 2016-12-24 NOTE — Pre-Procedure Instructions (Signed)
ECG 12-lead5/14/2018 [place] Component Name Value Ref Range  Vent Rate (bpm) 51   PR Interval (msec) 160   QRS Interval (msec) 140   QT Interval (msec) 462   QTc (msec) 425   Result Narrative  Sinus bradycardia with premature atrial complexes Right bundle branch block Abnormal ECG When compared with ECG of 11-Sep-2014 16:07, premature atrial complexes are now present Right bundle branch block has replaced Incomplete right bundle branch block I reviewed and concur with this report. Electronically signed YT:MMITVI MD, Hewitt Blade [phone]) on 11/12/2016 1:57:29 PM

## 2016-12-24 NOTE — Pre-Procedure Instructions (Signed)
Progress Notes - in this encounter  Table of Contents for Progress Notes Hooten, Florinda Marker., MD - 12/11/2016 11:00 AM EDT Kerin Perna, RN - 12/11/2016 11:00 AM EDT   Marry Guan Florinda Marker., MD - 12/11/2016 11:00 AM EDT Formatting of this note may be different from the original. Chief Complaint: Chief Complaint  Patient presents with  . Knee Pain  Left knee degenerative arthrosis, discuss surgery   Reason for Visit: The patient is a 83 y.o. male who presents today with his wife for reevaluation of his left knee. He reports a 2-3 year(s) history of left knee pain. He localizes most of the pain along the medial aspect of the knee. He reports some swelling, no locking, and significant giving way of the knee. The pain is aggravated by any weight bearing, going up and down stairs and rising after sitting. The patient has not appreciated any significant improvement despite use of a cane and intraarticular corticosteroid injection. He is using a cane for ambulation. He does report some low back pain and also has some early dementia.  Of note, the patient developed an ulceration to the left lateral malleolus that required treatment in the [place]. Apparently vascular studies were normal but those results are not available for review. He was evidently cleared by vascular for consideration of knee surgery.   The patient states that the knee pain has progressed to the point that it is significantly interfering with his activities of daily living.  Medications: Current Outpatient Prescriptions  Medication Sig Dispense Refill  . amLODIPine (NORVASC) 5 MG tablet TAKE ONE TABLET BY MOUTH ONCE DAILY 30 tablet 11  . ascorbic acid, vitamin C, (VITAMIN C) 1000 MG tablet Take 1,000 mg by mouth once daily.   Marland Kitchen aspirin 81 MG EC tablet Take 81 mg by mouth every other day.  . busPIRone (BUSPAR) 10 MG tablet Take 1 tablet (10 mg total) by mouth 2 (two) times daily. 60 tablet 11  .  donepezil (ARICEPT) 10 MG tablet Take 1 tablet (10 mg total) by mouth nightly. 90 tablet 3  . finasteride (PROSCAR) 5 mg tablet Take 1 tablet (5 mg total) by mouth once daily. 30 tablet 11  . flaxseed oil Oil Take by mouth once daily.  . memantine (NAMENDA) 10 MG tablet Take 1 tablet (10 mg total) by mouth 2 (two) times daily. 180 tablet 1  . MULTIVITAMIN ORAL Take by mouth once daily.  . mupirocin (BACTROBAN) 2 % ointment Apply topically once daily. With dressing changes until healed 22 g 0  . tamsulosin (FLOMAX) 0.4 mg capsule  . UNABLE TO FIND CPAP   No current facility-administered medications for this visit.   Allergies: Allergies  Allergen Reactions  . Morphine Other (See Comments)  Severe confusion  . Soy Protein Rash  skin eruptions  . Tramadol Anxiety  Severe confusion   Past Medical History: Past Medical History:  Diagnosis Date  . Adenomatous polyps  . Anemia  . Asbestosis (CMS-HCC)  . Atrial fibrillation (CMS-HCC)  . 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  . Cancer (CMS-HCC)  . Dementia  . History of mitral valve prolapse  . Hyperglycemia  . Hyperlipidemia  . Hypertension  . Sleep apnea  . Teratoma   Past Surgical History: Past Surgical History:  Procedure Laterality Date  . Arthroscopic release of the long head of the biceps tnedon plus arthroscopic subacromial decompression followed by mini incision rotator cuff repair. Right 01/27/14  . Attempted  repair of hernia site related to excision of teratoma  . COLONOSCOPY N/A 12/18/2015  Procedure: COLONOSCOPY, FLEXIBLE; DIAGNOSTIC, INCLUDING COLLECTION OF SPECIMEN(S) BY BRUSHING OR WASHING, WHEN PERFORMED (SEPARATE PROCEDURE); Surgeon: Lucrezia Europe Ang Maxie Better, MD; Location: [facility] OR; Service: General Surgery; Laterality: N/A;  . Excision of teratoma  . TONSILLECTOMY   Social History: Social History   Social History  . Marital status: Married  Spouse name: [name]  . Number of children: 1  .  Years of education: 43   Occupational History  . Retired   Social History Main Topics  . Smoking status: Former Smoker  Packs/day: 1.00  Years: 15.00  Types: Cigarettes  Quit date: 05/06/1967  . Smokeless tobacco: Never Used  . Alcohol use No  . Drug use: No  . Sexual activity: No   Other Topics Concern  . Not on file   Social History Narrative  . No narrative on file   Family History: Family History  Problem Relation Age of Onset  . Diabetes Mother  . Heart failure Father  . Parkinsonism Father  . Sleep disorder Father  . Heart disease Brother   Review of Systems: A comprehensive 14 point ROS was performed, reviewed, and the pertinent orthopaedic findings are documented in the HPI.  Exam BP 148/78  Ht 180.3 cm (5\' 11" )  Wt 81 kg (178 lb 9.6 oz)  BMI 24.91 kg/m   General:  Well-developed, well-nourished male seen in no acute distress. Stooped posture. Antalgic gait. Varus thrust to the left knee.  HEENT:  Atraumatic, normocephalic. Pupils are equal and reactive to light. Extraocular motion is intact. Sclera are clear. Oropharynx is clear with moist mucosa.  Neck:  Supple, nontender, and with good ROM. No thyromegaly, adenopathy, JVD, or carotid bruits.  Lungs:  Clear to auscultation bilaterally.  Cardiovascular:  Regular rate and rhythm. Normal S1, S2. No murmur . No appreciable gallops or rubs. Peripheral pulses are palpable. No lower extremity edema. Homan`s test is negative.  Abdomen:  Soft, nontender, nondistended. Bowel sounds are present.  Extremities: Good strength, stability, and range of motion of the upper extremities. Good range of motion of the hips and ankles.  Left Knee: Soft tissue swelling: mild Effusion: minimal Erythema: none Crepitance: mild Tenderness: medial joint line Alignment: relative varus Mediolateral laxity: medial pseudolaxity Posterior sag: negative Patellar tracking: Good tracking without evidence of subluxation or  tilt Atrophy: VMO and vastus medialis atrophy.  Quadriceps tone was fair. Range of motion: 0/0/125 degrees  Neurologic:  Awake, alert, and oriented.  Sensory function is intact to pinprick and light touch.  Motor strength is judged to be 5/5.  Motor coordination is within normal limits.  No apparent clonus. No tremor.   X-rays: I reviewed the left knee radiographs that were performed at [facility] on October 13, 2016. There is narrowing of the medial cartilage space with bone-on-bone articulation and associated varus alignment. Osteophyte formation is noted. Subchondral sclerosis is noted. No evidence of fracture or dislocation.   Impression: Degenerative arthrosis of the left knee  Plan:  The findings were discussed in detail with the patient. The patient was given informational material on total knee replacement. Conservative treatment options were reviewed with the patient. We discussed the risks and benefits of surgical intervention. The usual perioperative course was also discussed in detail. The patient expressed understanding of the risks and benefits of surgical intervention and would like to proceed with plans for left total knee arthroplasty.  I would like to obtain the arterial  studies as well as any recommendations by Drs. Dew/Schnier concerning his vascular status.  MEDICAL CLEARANCE: Per anesthesiology. ACTIVITY: As tolerated. WORK STATUS: Not applicable. THERAPY: Preoperative physical therapy evaluation. MEDICATIONS: New Prescriptions  No medications on file   FOLLOW-UP: Return for preop History & Physical pending surgery date.  James P. Holley Bouche., M.D.  This note was generated in part with voice recognition software and I apologize for any typographical errors that were not detected and corrected.

## 2016-12-24 NOTE — Patient Instructions (Addendum)
  Your procedure is scheduled on: Wednesday December 31, 2016. Report to Same Day Surgery. To find out your arrival time please call [phone] between 1PM - 3PM on Tuesday December 30, 2016.  Remember: Instructions that are not followed completely may result in serious medical risk, up to and including death, or upon the discretion of your surgeon and anesthesiologist your surgery may need to be rescheduled.    _x___ 1. Do not eat food or drink liquids after midnight. No gum chewing or hard candies.     ____ 2. No Alcohol for 24 hours before or after surgery.   ____ 3. Bring all medications with you on the day of surgery if instructed.    __x__ 4. Notify your doctor if there is any change in your medical condition     (cold, fever, infections).    _____ 5. No smoking 24 hours prior to surgery.     Do not wear jewelry, make-up, hairpins, clips or nail polish.  Do not wear lotions, powders, or perfumes.   Do not shave 48 hours prior to surgery. Men may shave face and neck.  Do not bring valuables to the hospital.    [facility] is not responsible for any belongings or valuables.               Contacts, dentures or bridgework may not be worn into surgery.  Leave your suitcase in the car. After surgery it may be brought to your room.  For patients admitted to the hospital, discharge time is determined by your treatment team.   Patients discharged the day of surgery will not be allowed to drive home.    Please read over the following fact sheets that you were given:   [facility] Preparing for Surgery  _x___ Take these medicines the morning of surgery with A SIP OF WATER:    1. amLODipine (NORVASC)   2. busPIRone (BUSPAR)   3. memantine (NAMENDA)    ____ Fleet Enema (as directed)   __x__ Use CHG Soap as directed on instruction sheet  ____ Use inhalers on the day of surgery and bring to hospital day of surgery  ____ Stop metformin 2 days prior to surgery    ____ Take 1/2 of  usual insulin dose the night before surgery and none on the morning of  surgery.   ____ Stop aspirin on   __x__ Stop Anti-inflammatories such as Advil, Aleve, Ibuprofen, Motrin, Naproxen, Naprosyn, Goodies powders or  aspirin products. OK to take Tylenol.   __x__ Stop supplements:  Ascorbic Acid (VITAMIN C),Biotin,Flaxseed, Linseed, (FLAX SEED OIL)   until after surgery.    __x__ Bring C-Pap to the hospital.

## 2016-12-25 LAB — URINE CULTURE: SPECIAL REQUESTS: NORMAL

## 2016-12-25 NOTE — Pre-Procedure Instructions (Signed)
Catharine FAXED TO DR HOOTEN'S OFFICE

## 2016-12-26 NOTE — Pre-Procedure Instructions (Signed)
Received fax from Dr. Clydell Hakim office Margaretha Sheffield) and PCP Dr. Gilford Rile for pt to hold aspirin for 1 week prior to surgery.  Pt's wife has been informed per Elaine's note.

## 2016-12-31 ENCOUNTER — Inpatient Hospital Stay: Payer: PPO | Admitting: Anesthesiology

## 2016-12-31 ENCOUNTER — Encounter
Admission: RE | Disposition: A | Discharge status: Skilled Nursing Facility | Payer: Self-pay | Source: Ambulatory Visit | Attending: Orthopedic Surgery

## 2016-12-31 ENCOUNTER — Inpatient Hospital Stay: Payer: PPO

## 2016-12-31 ENCOUNTER — Inpatient Hospital Stay
Admission: RE | Admit: 2016-12-31 | Discharge: 2017-01-02 | DRG: 470 | Disposition: A | Discharge status: Skilled Nursing Facility | Payer: PPO | Source: Ambulatory Visit | Attending: Orthopedic Surgery | Admitting: Orthopedic Surgery

## 2016-12-31 ENCOUNTER — Encounter: Payer: Self-pay | Admitting: *Deleted

## 2016-12-31 DIAGNOSIS — R1312 Dysphagia, oropharyngeal phase: Secondary | ICD-10-CM | POA: Diagnosis not present

## 2016-12-31 DIAGNOSIS — E785 Hyperlipidemia, unspecified: Secondary | ICD-10-CM | POA: Diagnosis present

## 2016-12-31 DIAGNOSIS — G4730 Sleep apnea, unspecified: Secondary | ICD-10-CM | POA: Diagnosis present

## 2016-12-31 DIAGNOSIS — F0390 Unspecified dementia without behavioral disturbance: Secondary | ICD-10-CM | POA: Diagnosis not present

## 2016-12-31 DIAGNOSIS — F419 Anxiety disorder, unspecified: Secondary | ICD-10-CM | POA: Diagnosis not present

## 2016-12-31 DIAGNOSIS — I10 Essential (primary) hypertension: Secondary | ICD-10-CM | POA: Diagnosis present

## 2016-12-31 DIAGNOSIS — F329 Major depressive disorder, single episode, unspecified: Secondary | ICD-10-CM | POA: Diagnosis present

## 2016-12-31 DIAGNOSIS — F0391 Unspecified dementia with behavioral disturbance: Secondary | ICD-10-CM | POA: Diagnosis not present

## 2016-12-31 DIAGNOSIS — I779 Disorder of arteries and arterioles, unspecified: Secondary | ICD-10-CM | POA: Diagnosis present

## 2016-12-31 DIAGNOSIS — Z96659 Presence of unspecified artificial knee joint: Secondary | ICD-10-CM

## 2016-12-31 DIAGNOSIS — D649 Anemia, unspecified: Secondary | ICD-10-CM | POA: Diagnosis present

## 2016-12-31 DIAGNOSIS — Z9989 Dependence on other enabling machines and devices: Secondary | ICD-10-CM

## 2016-12-31 DIAGNOSIS — R41841 Cognitive communication deficit: Secondary | ICD-10-CM | POA: Diagnosis not present

## 2016-12-31 DIAGNOSIS — M1712 Unilateral primary osteoarthritis, left knee: Secondary | ICD-10-CM | POA: Diagnosis not present

## 2016-12-31 DIAGNOSIS — R262 Difficulty in walking, not elsewhere classified: Secondary | ICD-10-CM | POA: Diagnosis not present

## 2016-12-31 DIAGNOSIS — Z96652 Presence of left artificial knee joint: Secondary | ICD-10-CM | POA: Diagnosis not present

## 2016-12-31 DIAGNOSIS — I4891 Unspecified atrial fibrillation: Secondary | ICD-10-CM | POA: Diagnosis not present

## 2016-12-31 DIAGNOSIS — D291 Benign neoplasm of prostate: Secondary | ICD-10-CM | POA: Diagnosis not present

## 2016-12-31 DIAGNOSIS — M6281 Muscle weakness (generalized): Secondary | ICD-10-CM | POA: Diagnosis not present

## 2016-12-31 DIAGNOSIS — Z87891 Personal history of nicotine dependence: Secondary | ICD-10-CM

## 2016-12-31 DIAGNOSIS — M1990 Unspecified osteoarthritis, unspecified site: Secondary | ICD-10-CM | POA: Diagnosis not present

## 2016-12-31 DIAGNOSIS — F3289 Other specified depressive episodes: Secondary | ICD-10-CM | POA: Diagnosis not present

## 2016-12-31 DIAGNOSIS — Z471 Aftercare following joint replacement surgery: Secondary | ICD-10-CM | POA: Diagnosis not present

## 2016-12-31 HISTORY — PX: KNEE ARTHROPLASTY: SHX992

## 2016-12-31 LAB — ABO/RH: ABO/RH(D): A NEG

## 2016-12-31 SURGERY — ARTHROPLASTY, KNEE, TOTAL, USING IMAGELESS COMPUTER-ASSISTED NAVIGATION
Anesthesia: Spinal | Site: Knee | Laterality: Left | Wound class: Clean

## 2016-12-31 MED ORDER — FINASTERIDE 5 MG PO TABS
5.0000 mg | ORAL_TABLET | Freq: Every day | ORAL | Status: DC
  Administered 2016-12-31 – 2017-01-01 (×2): 5 mg via ORAL
  Filled 2016-12-31 (×2): qty 1

## 2016-12-31 MED ORDER — VASOPRESSIN 20 UNIT/ML IV SOLN
INTRAVENOUS | Status: DC | PRN
  Administered 2016-12-31: 2 [IU] via INTRAVENOUS

## 2016-12-31 MED ORDER — FENTANYL CITRATE (PF) 100 MCG/2ML IJ SOLN
INTRAMUSCULAR | Status: DC | PRN
  Administered 2016-12-31: 50 ug via INTRAVENOUS

## 2016-12-31 MED ORDER — ENOXAPARIN SODIUM 30 MG/0.3ML ~~LOC~~ SOLN
30.0000 mg | Freq: Two times a day (BID) | SUBCUTANEOUS | Status: DC
  Administered 2017-01-01 – 2017-01-02 (×3): 30 mg via SUBCUTANEOUS
  Filled 2016-12-31 (×3): qty 0.3

## 2016-12-31 MED ORDER — PANTOPRAZOLE SODIUM 40 MG PO TBEC
40.0000 mg | DELAYED_RELEASE_TABLET | Freq: Two times a day (BID) | ORAL | Status: DC
  Administered 2016-12-31 – 2017-01-02 (×4): 40 mg via ORAL
  Filled 2016-12-31 (×4): qty 1

## 2016-12-31 MED ORDER — ACETAMINOPHEN 650 MG RE SUPP: 650.0000 mg | Freq: Four times a day (QID) | RECTAL | Status: DC | PRN

## 2016-12-31 MED ORDER — NEOMYCIN-POLYMYXIN B GU 40-200000 IR SOLN
Status: DC | PRN
  Administered 2016-12-31: 14 mL

## 2016-12-31 MED ORDER — BUSPIRONE HCL 10 MG PO TABS
10.0000 mg | ORAL_TABLET | Freq: Two times a day (BID) | ORAL | Status: DC
  Administered 2016-12-31 – 2017-01-02 (×4): 10 mg via ORAL
  Filled 2016-12-31 (×4): qty 1

## 2016-12-31 MED ORDER — DEXAMETHASONE SODIUM PHOSPHATE 10 MG/ML IJ SOLN
INTRAMUSCULAR | Status: AC
  Filled 2016-12-31: qty 1

## 2016-12-31 MED ORDER — SODIUM CHLORIDE 0.9 % IV SOLN
INTRAVENOUS | Status: DC | PRN
  Administered 2016-12-31: 60 mL

## 2016-12-31 MED ORDER — CA PHOSPHATE-CHOLECALCIFEROL 250-500 MG-UNIT PO CHEW: 2.0000 | CHEWABLE_TABLET | Freq: Every day | ORAL | Status: DC

## 2016-12-31 MED ORDER — ONDANSETRON HCL 4 MG/2ML IJ SOLN: 4.0000 mg | Freq: Four times a day (QID) | INTRAMUSCULAR | Status: DC | PRN

## 2016-12-31 MED ORDER — CEFAZOLIN SODIUM-DEXTROSE 2-4 GM/100ML-% IV SOLN
2.0000 g | INTRAVENOUS | Status: AC
  Administered 2016-12-31: 2 g via INTRAVENOUS

## 2016-12-31 MED ORDER — OXYCODONE HCL 5 MG PO TABS
5.0000 mg | ORAL_TABLET | ORAL | Status: DC | PRN
  Administered 2016-12-31 – 2017-01-01 (×3): 5 mg via ORAL
  Filled 2016-12-31 (×3): qty 1

## 2016-12-31 MED ORDER — PHENYLEPHRINE HCL 10 MG/ML IJ SOLN
INTRAMUSCULAR | Status: AC
  Filled 2016-12-31: qty 1

## 2016-12-31 MED ORDER — CALCIUM CARBONATE ANTACID 500 MG PO CHEW
1.0000 | CHEWABLE_TABLET | Freq: Every day | ORAL | Status: DC
  Administered 2017-01-01 – 2017-01-02 (×2): 200 mg via ORAL
  Filled 2016-12-31 (×2): qty 1

## 2016-12-31 MED ORDER — PROPOFOL 10 MG/ML IV BOLUS
INTRAVENOUS | Status: DC | PRN
  Administered 2016-12-31 (×3): 9 mg via INTRAVENOUS

## 2016-12-31 MED ORDER — CHLORHEXIDINE GLUCONATE 4 % EX LIQD: 60.0000 mL | Freq: Once | CUTANEOUS | Status: DC

## 2016-12-31 MED ORDER — DEXAMETHASONE SODIUM PHOSPHATE 4 MG/ML IJ SOLN
INTRAMUSCULAR | Status: DC | PRN
  Administered 2016-12-31: 5 mg via INTRAVENOUS

## 2016-12-31 MED ORDER — SENNOSIDES-DOCUSATE SODIUM 8.6-50 MG PO TABS
1.0000 | ORAL_TABLET | Freq: Two times a day (BID) | ORAL | Status: DC
Start: 2016-12-31 — End: 2017-01-02
  Administered 2016-12-31 – 2017-01-02 (×4): 1 via ORAL
  Filled 2016-12-31 (×4): qty 1

## 2016-12-31 MED ORDER — ONDANSETRON HCL 4 MG/2ML IJ SOLN: 4.0000 mg | Freq: Once | INTRAMUSCULAR | Status: DC | PRN

## 2016-12-31 MED ORDER — ONDANSETRON HCL 4 MG PO TABS: 4.0000 mg | ORAL_TABLET | Freq: Four times a day (QID) | ORAL | Status: DC | PRN

## 2016-12-31 MED ORDER — VITAMIN B-12 1000 MCG PO TABS
5000.0000 ug | ORAL_TABLET | Freq: Every day | ORAL | Status: DC
  Administered 2017-01-01 – 2017-01-02 (×2): 5000 ug via ORAL
  Filled 2016-12-31 (×2): qty 5

## 2016-12-31 MED ORDER — BIOTIN 5000 MCG PO TABS
5000.0000 ug | ORAL_TABLET | Freq: Every day | ORAL | Status: DC
Start: 2016-12-31 — End: 2016-12-31

## 2016-12-31 MED ORDER — FERROUS SULFATE 325 (65 FE) MG PO TABS
325.0000 mg | ORAL_TABLET | Freq: Two times a day (BID) | ORAL | Status: DC
  Administered 2017-01-01 – 2017-01-02 (×3): 325 mg via ORAL
  Filled 2016-12-31 (×3): qty 1

## 2016-12-31 MED ORDER — ACETAMINOPHEN 325 MG PO TABS
650.0000 mg | ORAL_TABLET | Freq: Four times a day (QID) | ORAL | Status: DC | PRN
  Administered 2017-01-01 – 2017-01-02 (×3): 650 mg via ORAL
  Filled 2016-12-31 (×4): qty 2

## 2016-12-31 MED ORDER — TAMSULOSIN HCL 0.4 MG PO CAPS
0.4000 mg | ORAL_CAPSULE | Freq: Every day | ORAL | Status: DC
  Administered 2016-12-31 – 2017-01-02 (×3): 0.4 mg via ORAL
  Filled 2016-12-31 (×3): qty 1

## 2016-12-31 MED ORDER — HYDROMORPHONE HCL 1 MG/ML IJ SOLN
0.5000 mg | INTRAMUSCULAR | Status: DC | PRN
  Administered 2016-12-31 (×2): 0.5 mg via INTRAVENOUS
  Filled 2016-12-31 (×2): qty 1

## 2016-12-31 MED ORDER — ALUM & MAG HYDROXIDE-SIMETH 200-200-20 MG/5ML PO SUSP: 30.0000 mL | ORAL | Status: DC | PRN

## 2016-12-31 MED ORDER — GLYCOPYRROLATE 0.2 MG/ML IJ SOLN
INTRAMUSCULAR | Status: DC | PRN
  Administered 2016-12-31: 0.2 mg via INTRAVENOUS
  Administered 2016-12-31: 0.1 mg via INTRAVENOUS

## 2016-12-31 MED ORDER — BUPIVACAINE HCL (PF) 0.5 % IJ SOLN
INTRAMUSCULAR | Status: DC | PRN
  Administered 2016-12-31: 2.5 mL

## 2016-12-31 MED ORDER — BISACODYL 10 MG RE SUPP
10.0000 mg | Freq: Every day | RECTAL | Status: DC | PRN
  Administered 2017-01-02: 10 mg via RECTAL
  Filled 2016-12-31: qty 1

## 2016-12-31 MED ORDER — DIPHENHYDRAMINE HCL 12.5 MG/5ML PO ELIX: 12.5000 mg | ORAL_SOLUTION | ORAL | Status: DC | PRN

## 2016-12-31 MED ORDER — MEMANTINE HCL 5 MG PO TABS
10.0000 mg | ORAL_TABLET | Freq: Two times a day (BID) | ORAL | Status: DC
  Administered 2016-12-31 – 2017-01-02 (×4): 10 mg via ORAL
  Filled 2016-12-31 (×4): qty 2

## 2016-12-31 MED ORDER — PHENOL 1.4 % MT LIQD
1.0000 | OROMUCOSAL | Status: DC | PRN
  Filled 2016-12-31: qty 177

## 2016-12-31 MED ORDER — PROPOFOL 500 MG/50ML IV EMUL
INTRAVENOUS | Status: DC | PRN
  Administered 2016-12-31: 40 ug/kg/min via INTRAVENOUS

## 2016-12-31 MED ORDER — FAMOTIDINE 20 MG PO TABS: 20.0000 mg | ORAL_TABLET | Freq: Once | ORAL | Status: DC

## 2016-12-31 MED ORDER — DONEPEZIL HCL 5 MG PO TABS
10.0000 mg | ORAL_TABLET | Freq: Every day | ORAL | Status: DC
  Administered 2016-12-31 – 2017-01-01 (×2): 10 mg via ORAL
  Filled 2016-12-31 (×2): qty 1
  Filled 2016-12-31: qty 2

## 2016-12-31 MED ORDER — FENTANYL CITRATE (PF) 100 MCG/2ML IJ SOLN
INTRAMUSCULAR | Status: AC
  Filled 2016-12-31: qty 2

## 2016-12-31 MED ORDER — CHOLECALCIFEROL 10 MCG (400 UNIT) PO TABS
400.0000 [IU] | ORAL_TABLET | Freq: Every day | ORAL | Status: DC
  Administered 2017-01-01 – 2017-01-02 (×2): 400 [IU] via ORAL
  Filled 2016-12-31 (×2): qty 1

## 2016-12-31 MED ORDER — CELECOXIB 200 MG PO CAPS
200.0000 mg | ORAL_CAPSULE | Freq: Two times a day (BID) | ORAL | Status: DC
  Administered 2016-12-31 – 2017-01-02 (×5): 200 mg via ORAL
  Filled 2016-12-31 (×5): qty 1

## 2016-12-31 MED ORDER — TETRACAINE HCL 1 % IJ SOLN
INTRAMUSCULAR | Status: AC
  Filled 2016-12-31: qty 2

## 2016-12-31 MED ORDER — METOCLOPRAMIDE HCL 10 MG PO TABS
10.0000 mg | ORAL_TABLET | Freq: Three times a day (TID) | ORAL | Status: DC
  Administered 2016-12-31 – 2017-01-02 (×7): 10 mg via ORAL
  Filled 2016-12-31 (×7): qty 1

## 2016-12-31 MED ORDER — AMLODIPINE BESYLATE 5 MG PO TABS
5.0000 mg | ORAL_TABLET | Freq: Every day | ORAL | Status: DC
Start: 2017-01-01 — End: 2017-01-02
  Administered 2017-01-01 – 2017-01-02 (×2): 5 mg via ORAL
  Filled 2016-12-31 (×2): qty 1

## 2016-12-31 MED ORDER — TRANEXAMIC ACID 1000 MG/10ML IV SOLN
1000.0000 mg | INTRAVENOUS | Status: AC
  Administered 2016-12-31: 1000 mg via INTRAVENOUS
  Filled 2016-12-31: qty 10

## 2016-12-31 MED ORDER — ADULT MULTIVITAMIN W/MINERALS CH
1.0000 | ORAL_TABLET | Freq: Every day | ORAL | Status: DC
  Administered 2017-01-01 – 2017-01-02 (×2): 1 via ORAL
  Filled 2016-12-31 (×2): qty 1

## 2016-12-31 MED ORDER — VITAMIN C PO CHEW: 2.0000 | CHEWABLE_TABLET | Freq: Every day | ORAL | Status: DC

## 2016-12-31 MED ORDER — TETRACAINE HCL 1 % IJ SOLN
INTRAMUSCULAR | Status: DC | PRN
  Administered 2016-12-31: 5 mg via INTRASPINAL

## 2016-12-31 MED ORDER — ACETAMINOPHEN 10 MG/ML IV SOLN
INTRAVENOUS | Status: AC
  Filled 2016-12-31: qty 100

## 2016-12-31 MED ORDER — EPHEDRINE SULFATE 50 MG/ML IJ SOLN
INTRAMUSCULAR | Status: AC
  Filled 2016-12-31: qty 1

## 2016-12-31 MED ORDER — SODIUM CHLORIDE 0.9 % IV SOLN
INTRAVENOUS | Status: DC
  Administered 2016-12-31 (×2): via INTRAVENOUS

## 2016-12-31 MED ORDER — LIDOCAINE HCL (PF) 2 % IJ SOLN
INTRAMUSCULAR | Status: AC
  Filled 2016-12-31: qty 2

## 2016-12-31 MED ORDER — LIDOCAINE HCL (CARDIAC) 20 MG/ML IV SOLN
INTRAVENOUS | Status: DC | PRN
  Administered 2016-12-31: 100 mg via INTRAVENOUS

## 2016-12-31 MED ORDER — VITAMIN C 500 MG PO TABS
500.0000 mg | ORAL_TABLET | Freq: Every day | ORAL | Status: DC
  Administered 2017-01-01 – 2017-01-02 (×2): 500 mg via ORAL
  Filled 2016-12-31 (×2): qty 1

## 2016-12-31 MED ORDER — SODIUM CHLORIDE 0.9 % IV SOLN
INTRAVENOUS | Status: DC | PRN
  Administered 2016-12-31: 20 ug/min via INTRAVENOUS

## 2016-12-31 MED ORDER — TRANEXAMIC ACID 1000 MG/10ML IV SOLN
1000.0000 mg | Freq: Once | INTRAVENOUS | Status: AC
  Administered 2016-12-31: 1000 mg via INTRAVENOUS
  Filled 2016-12-31: qty 10

## 2016-12-31 MED ORDER — CEFAZOLIN SODIUM-DEXTROSE 2-4 GM/100ML-% IV SOLN
2.0000 g | Freq: Four times a day (QID) | INTRAVENOUS | Status: AC
  Administered 2016-12-31 – 2017-01-01 (×4): 2 g via INTRAVENOUS
  Filled 2016-12-31 (×4): qty 100

## 2016-12-31 MED ORDER — BUPIVACAINE HCL (PF) 0.25 % IJ SOLN
INTRAMUSCULAR | Status: AC
  Filled 2016-12-31: qty 60

## 2016-12-31 MED ORDER — LACTATED RINGERS IV SOLN
INTRAVENOUS | Status: DC
  Administered 2016-12-31 (×2): via INTRAVENOUS

## 2016-12-31 MED ORDER — ACETAMINOPHEN 10 MG/ML IV SOLN
INTRAVENOUS | Status: DC | PRN
  Administered 2016-12-31: 1000 mg via INTRAVENOUS

## 2016-12-31 MED ORDER — BUPIVACAINE LIPOSOME 1.3 % IJ SUSP
INTRAMUSCULAR | Status: AC
  Filled 2016-12-31: qty 20

## 2016-12-31 MED ORDER — GLYCOPYRROLATE 0.2 MG/ML IJ SOLN
INTRAMUSCULAR | Status: AC
  Filled 2016-12-31: qty 1

## 2016-12-31 MED ORDER — ACETAMINOPHEN 10 MG/ML IV SOLN
1000.0000 mg | Freq: Four times a day (QID) | INTRAVENOUS | Status: AC
  Administered 2016-12-31 – 2017-01-01 (×4): 1000 mg via INTRAVENOUS
  Filled 2016-12-31 (×4): qty 100

## 2016-12-31 MED ORDER — POTASSIUM 99 MG PO TABS: 99.0000 mg | ORAL_TABLET | Freq: Every day | ORAL | Status: DC | PRN

## 2016-12-31 MED ORDER — NEOMYCIN-POLYMYXIN B GU 40-200000 IR SOLN
Status: AC
  Filled 2016-12-31: qty 20

## 2016-12-31 MED ORDER — MAGNESIUM HYDROXIDE 400 MG/5ML PO SUSP
30.0000 mL | Freq: Every day | ORAL | Status: DC | PRN
  Administered 2017-01-02: 30 mL via ORAL
  Filled 2016-12-31: qty 30

## 2016-12-31 MED ORDER — FENTANYL CITRATE (PF) 100 MCG/2ML IJ SOLN: 25.0000 ug | INTRAMUSCULAR | Status: DC | PRN

## 2016-12-31 MED ORDER — BUPIVACAINE HCL (PF) 0.25 % IJ SOLN
INTRAMUSCULAR | Status: DC | PRN
  Administered 2016-12-31: 60 mL

## 2016-12-31 MED ORDER — FLAX SEED OIL 1000 MG PO CAPS: 1000.0000 mg | ORAL_CAPSULE | Freq: Every day | ORAL | Status: DC

## 2016-12-31 MED ORDER — CEFAZOLIN SODIUM-DEXTROSE 2-4 GM/100ML-% IV SOLN
INTRAVENOUS | Status: AC
  Filled 2016-12-31: qty 100

## 2016-12-31 MED ORDER — PROPOFOL 10 MG/ML IV BOLUS
INTRAVENOUS | Status: AC
  Filled 2016-12-31: qty 20

## 2016-12-31 MED ORDER — FLEET ENEMA 7-19 GM/118ML RE ENEM: 1.0000 | ENEMA | Freq: Once | RECTAL | Status: DC | PRN

## 2016-12-31 MED ORDER — MENTHOL 3 MG MT LOZG
1.0000 | LOZENGE | OROMUCOSAL | Status: DC | PRN
  Filled 2016-12-31: qty 9

## 2016-12-31 MED ORDER — SODIUM CHLORIDE 0.9 % IJ SOLN
INTRAMUSCULAR | Status: AC
  Filled 2016-12-31: qty 50

## 2016-12-31 SURGICAL SUPPLY — 67 items
BATTERY INSTRU NAVIGATION (MISCELLANEOUS) ×12 IMPLANT
BLADE CLIPPER SURG (BLADE) ×3 IMPLANT
BLADE SAW 1 (BLADE) ×3 IMPLANT
BLADE SAW 1/2 (BLADE) ×3 IMPLANT
BLADE SAW 70X12.5 (BLADE) IMPLANT
CANISTER SUCT 1200ML W/VALVE (MISCELLANEOUS) ×3 IMPLANT
CANISTER SUCT 3000ML PPV (MISCELLANEOUS) ×6 IMPLANT
CAPT KNEE TOTAL 3 ATTUNE ×3 IMPLANT
CATH TRAY METER 16FR LF (MISCELLANEOUS) ×3 IMPLANT
CEMENT HV SMART SET (Cement) ×6 IMPLANT
COOLER POLAR GLACIER W/PUMP (MISCELLANEOUS) ×3 IMPLANT
CUFF TOURN 24 STER (MISCELLANEOUS) IMPLANT
CUFF TOURN 30 STER DUAL PORT (MISCELLANEOUS) ×3 IMPLANT
DRAPE SHEET LG 3/4 BI-LAMINATE (DRAPES) ×3 IMPLANT
DRSG DERMACEA 8X12 NADH (GAUZE/BANDAGES/DRESSINGS) ×3 IMPLANT
DRSG OPSITE POSTOP 4X14 (GAUZE/BANDAGES/DRESSINGS) ×3 IMPLANT
DRSG TEGADERM 4X4.75 (GAUZE/BANDAGES/DRESSINGS) ×3 IMPLANT
DURAPREP 26ML APPLICATOR (WOUND CARE) ×6 IMPLANT
ELECT CAUTERY BLADE 6.4 (BLADE) ×3 IMPLANT
ELECT REM PT RETURN 9FT ADLT (ELECTROSURGICAL) ×3
ELECTRODE REM PT RTRN 9FT ADLT (ELECTROSURGICAL) ×1 IMPLANT
EVACUATOR 1/8 PVC DRAIN (DRAIN) ×3 IMPLANT
EX-PIN ORTHOLOCK NAV 4X150 (PIN) ×6 IMPLANT
GLOVE BIOGEL M STRL SZ7.5 (GLOVE) ×12 IMPLANT
GLOVE BIOGEL PI IND STRL 7.0 (GLOVE) ×1 IMPLANT
GLOVE BIOGEL PI IND STRL 8 (GLOVE) ×2 IMPLANT
GLOVE BIOGEL PI IND STRL 9 (GLOVE) ×1 IMPLANT
GLOVE BIOGEL PI INDICATOR 7.0 (GLOVE) ×2
GLOVE BIOGEL PI INDICATOR 8 (GLOVE) ×4
GLOVE BIOGEL PI INDICATOR 9 (GLOVE) ×2
GLOVE INDICATOR 8.0 STRL GRN (GLOVE) ×3 IMPLANT
GLOVE SURG SYN 9.0  PF PI (GLOVE) ×2
GLOVE SURG SYN 9.0 PF PI (GLOVE) ×1 IMPLANT
GOWN STRL REUS W/ TWL LRG LVL3 (GOWN DISPOSABLE) ×2 IMPLANT
GOWN STRL REUS W/TWL 2XL LVL3 (GOWN DISPOSABLE) ×3 IMPLANT
GOWN STRL REUS W/TWL LRG LVL3 (GOWN DISPOSABLE) ×4
HOLDER FOLEY CATH W/STRAP (MISCELLANEOUS) ×3 IMPLANT
HOOD PEEL AWAY FLYTE STAYCOOL (MISCELLANEOUS) ×6 IMPLANT
KIT RM TURNOVER STRD PROC AR (KITS) ×3 IMPLANT
KNIFE SCULPS 14X20 (INSTRUMENTS) ×3 IMPLANT
LABEL OR SOLS (LABEL) ×3 IMPLANT
NDL SAFETY 18GX1.5 (NEEDLE) ×3 IMPLANT
NEEDLE SPNL 20GX3.5 QUINCKE YW (NEEDLE) ×3 IMPLANT
NS IRRIG 500ML POUR BTL (IV SOLUTION) ×3 IMPLANT
PACK TOTAL KNEE (MISCELLANEOUS) ×3 IMPLANT
PAD WRAPON POLAR KNEE (MISCELLANEOUS) ×1 IMPLANT
PIN DRILL QUICK PACK ×3 IMPLANT
PIN FIXATION 1/8DIA X 3INL (PIN) ×3 IMPLANT
PULSAVAC PLUS IRRIG FAN TIP (DISPOSABLE) ×3
SOL .9 NS 3000ML IRR  AL (IV SOLUTION) ×2
SOL .9 NS 3000ML IRR UROMATIC (IV SOLUTION) ×1 IMPLANT
SOL PREP PVP 2OZ (MISCELLANEOUS) ×3
SOLUTION PREP PVP 2OZ (MISCELLANEOUS) ×1 IMPLANT
SPONGE DRAIN TRACH 4X4 STRL 2S (GAUZE/BANDAGES/DRESSINGS) ×3 IMPLANT
STAPLER SKIN PROX 35W (STAPLE) ×3 IMPLANT
STRAP TIBIA SHORT (MISCELLANEOUS) ×3 IMPLANT
SUCTION FRAZIER HANDLE 10FR (MISCELLANEOUS) ×2
SUCTION TUBE FRAZIER 10FR DISP (MISCELLANEOUS) ×1 IMPLANT
SUT VIC AB 0 CT1 36 (SUTURE) ×3 IMPLANT
SUT VIC AB 1 CT1 36 (SUTURE) ×6 IMPLANT
SUT VIC AB 2-0 CT2 27 (SUTURE) ×3 IMPLANT
SYR 20CC LL (SYRINGE) ×3 IMPLANT
SYR 30ML LL (SYRINGE) ×6 IMPLANT
TIP FAN IRRIG PULSAVAC PLUS (DISPOSABLE) ×1 IMPLANT
TOWEL OR 17X26 4PK STRL BLUE (TOWEL DISPOSABLE) ×3 IMPLANT
TOWER CARTRIDGE SMART MIX (DISPOSABLE) ×3 IMPLANT
WRAPON POLAR PAD KNEE (MISCELLANEOUS) ×3

## 2016-12-31 NOTE — Anesthesia Preprocedure Evaluation (Signed)
Anesthesia Evaluation  Patient identified by MRN, date of birth, ID band Patient awake    Reviewed: Allergy & Precautions, NPO status , Patient's Chart, lab work & pertinent test results  History of Anesthesia Complications Negative for: history of anesthetic complications  Airway Mallampati: II       Dental   Pulmonary sleep apnea and Continuous Positive Airway Pressure Ventilation , former smoker,           Cardiovascular hypertension, Pt. on medications + dysrhythmias Atrial Fibrillation      Neuro/Psych Anxiety Depression Mild short-term memory loss/Dementia    GI/Hepatic Neg liver ROS, neg GERD  ,  Endo/Other  neg diabetes  Renal/GU negative Renal ROS     Musculoskeletal   Abdominal   Peds  Hematology  (+) anemia ,   Anesthesia Other Findings   Reproductive/Obstetrics                             Anesthesia Physical Anesthesia Plan  ASA: III  Anesthesia Plan: Spinal   Post-op Pain Management:    Induction:   PONV Risk Score and Plan:   Airway Management Planned:   Additional Equipment:   Intra-op Plan:   Post-operative Plan:   Informed Consent: I have reviewed the patients History and Physical, chart, labs and discussed the procedure including the risks, benefits and alternatives for the proposed anesthesia with the patient or authorized representative who has indicated his/her understanding and acceptance.     Plan Discussed with:   Anesthesia Plan Comments:         Anesthesia Quick Evaluation

## 2016-12-31 NOTE — Progress Notes (Signed)
[place] responded to an OR for prayer. Pt is awake and alert. Wife is bedside. Pt requested prayer for his knee (complete replacement) and the strength to do the necessary rehab. CH offered the ministry of conversation and prayer.    12/31/16 1400  Clinical Encounter Type  Visited With Patient;Patient and family together  Visit Type Initial;Spiritual support;Post-op  Referral From Nurse  Consult/Referral To Chaplain  Spiritual Encounters  Spiritual Needs Prayer

## 2016-12-31 NOTE — Progress Notes (Signed)
15 minute call to floor. 

## 2016-12-31 NOTE — Progress Notes (Addendum)
Pt arrived from OR. VSS. Sacral pad in place. IV infusing. Polar care on. TED and foot pumps on. Bone foam placed. Taught how to use the IS and TCDB. Wife in room - wants him to go to [facility] rehab if possible. Wife says he takes meds in applesauce. Pain 3/10, declined pain meds at this time. Stated 3 to 4/10 was a comfortable pain level for him. Requested chaplain for prayer and conversation.

## 2016-12-31 NOTE — Transfer of Care (Signed)
Immediate Anesthesia Transfer of Care Note  Patient: [name]  Procedure(s) Performed: Procedure(s): COMPUTER ASSISTED TOTAL KNEE ARTHROPLASTY (Left)  Patient Location: PACU  Anesthesia Type:Spinal  Level of Consciousness: sedated  Airway & Oxygen Therapy: Patient Spontanous Breathing and Patient connected to nasal cannula oxygen  Post-op Assessment: Report given to RN and Post -op Vital signs reviewed and stable  Post vital signs: Reviewed and stable  Last Vitals:  Vitals:   12/31/16 0609  BP: (!) 161/79  Pulse: (!) 51  Resp: 16  Temp: 36.5 C  SpO2: 100%    Last Pain:  Vitals:   12/31/16 0609  TempSrc: Oral         Complications: No apparent anesthesia complications

## 2016-12-31 NOTE — NC FL2 (Signed)
[place] LEVEL OF CARE SCREENING TOOL     IDENTIFICATION  Patient Name: [name] Birthdate: 1935/05/05 Sex: male Admission Date (Current Location): 12/31/2016  [place] and [place] Number:  Engineering geologist and Address:  [facility], [address], [place], [place] [address]      Provider Number: [id number]  Attending Physician Name and Address:  Dereck Leep, MD  Relative Name and Phone Number:       Current Level of Care: Hospital Recommended Level of Care: [place] Prior Approval Number:    Date Approved/Denied:   PASRR Number:  [id number] )  Discharge Plan: SNF    Current Diagnoses: Patient Active Problem List   Diagnosis Date Noted  . S/P total knee arthroplasty 12/31/2016  . Lower limb ulcer, ankle, left, limited to breakdown of skin ([place]) 11/18/2016  . Fall 02/18/2016  . Scalp laceration 02/18/2016  . Splenic laceration 02/18/2016  . Acute blood loss anemia 02/18/2016  . Multiple rib fractures 02/16/2016  . Mild dementia 04/10/2015  . Aggrieved 03/02/2015  . Absolute anemia 09/11/2014  . Carotid artery disease ([place]) 09/11/2014  . Dementia 09/11/2014  . History of repair of rotator cuff 03/02/2014  . Atrial fibrillation with RVR ([place]) 01/23/2014  . Personal history of other diseases of the circulatory system 01/23/2014  . HLD (hyperlipidemia) 01/23/2014  . Complete rotator cuff rupture of left shoulder 12/30/2013  . BP (high blood pressure) 11/07/2013  . Amnesia 11/07/2013  . Obstructive apnea 11/07/2013    Orientation RESPIRATION BLADDER Height & Weight     Self, Place  O2 (2 Liters Oxygen ) Continent Weight:   Height:     BEHAVIORAL SYMPTOMS/MOOD NEUROLOGICAL BOWEL NUTRITION STATUS      Continent Diet (Diet: Clear Liquid to be advanced. )  AMBULATORY STATUS COMMUNICATION OF NEEDS Skin   Extensive Assist Verbally Surgical wounds (Incision: Left Knee )                        Personal Care Assistance Level of Assistance  Bathing, Feeding, Dressing Bathing Assistance: Limited assistance Feeding assistance: Independent Dressing Assistance: Limited assistance     Functional Limitations Info  Sight, Hearing, Speech Sight Info: Adequate Hearing Info: Adequate Speech Info: Adequate    SPECIAL CARE FACTORS FREQUENCY  PT (By licensed PT), OT (By licensed OT)     PT Frequency:  (5) OT Frequency:  (5)            Contractures      Additional Factors Info  Code Status, Allergies, Isolation Precautions Code Status Info:  (Full Code. ) Allergies Info:  (Soybean Oil, Morphine And Related, Tramadol)     Isolation Precautions Info:  (MRSA Nasal Swab. )     Current Medications (12/31/2016):  This is the current hospital active medication list Current Facility-Administered Medications  Medication Dose Route Frequency Provider Last Rate Last Dose  . 0.9 %  sodium chloride infusion   Intravenous Continuous Hooten, Laurice Record, MD 100 mL/hr at 12/31/16 1327    . acetaminophen (OFIRMEV) IV 1,000 mg  1,000 mg Intravenous Q6H Hooten, Laurice Record, MD      . acetaminophen (TYLENOL) tablet 650 mg  650 mg Oral Q6H PRN Hooten, Laurice Record, MD       Or  . acetaminophen (TYLENOL) suppository 650 mg  650 mg Rectal Q6H PRN Hooten, Laurice Record, MD      . alum & mag hydroxide-simeth (MAALOX/MYLANTA)  200-200-20 MG/5ML suspension 30 mL  30 mL Oral Q4H PRN Hooten, Laurice Record, MD      . Derrill Memo ON 01/01/2017] amLODipine (NORVASC) tablet 5 mg  5 mg Oral Daily Hooten, Laurice Record, MD      . Biotin TABS 5,000 mcg  5,000 mcg Oral Daily Hooten, Laurice Record, MD      . bisacodyl (DULCOLAX) suppository 10 mg  10 mg Rectal Daily PRN Hooten, Laurice Record, MD      . busPIRone (BUSPAR) tablet 10 mg  10 mg Oral BID Hooten, Laurice Record, MD      . Ca Phosphate-Cholecalciferol 250-500 MG-UNIT CHEW 2 tablet  2 tablet Oral Daily Hooten, Laurice Record, MD      . ceFAZolin (ANCEF) IVPB 2g/100 mL premix  2 g Intravenous Q6H Hooten, Laurice Record, MD 200 mL/hr at 12/31/16 1355 2 g at 12/31/16 1355  . celecoxib (CELEBREX) capsule 200 mg  200 mg Oral Q12H Hooten, Laurice Record, MD      . diphenhydrAMINE (BENADRYL) 12.5 MG/5ML elixir 12.5-25 mg  12.5-25 mg Oral Q4H PRN Hooten, Laurice Record, MD      . donepezil (ARICEPT) tablet 10 mg  10 mg Oral QHS Hooten, Laurice Record, MD      . Derrill Memo ON 01/01/2017] enoxaparin (LOVENOX) injection 30 mg  30 mg Subcutaneous Q12H Hooten, Laurice Record, MD      . ferrous sulfate tablet 325 mg  325 mg Oral BID WC Hooten, Laurice Record, MD      . finasteride (PROSCAR) tablet 5 mg  5 mg Oral QHS Hooten, Laurice Record, MD      . Flax Seed Oil CAPS 1,000 mg  1,000 mg Oral Daily Hooten, Laurice Record, MD      . HYDROmorphone (DILAUDID) injection 0.5 mg  0.5 mg Intravenous Q2H PRN Hooten, Laurice Record, MD      . magnesium hydroxide (MILK OF MAGNESIA) suspension 30 mL  30 mL Oral Daily PRN Hooten, Laurice Record, MD      . memantine (NAMENDA) tablet 10 mg  10 mg Oral BID Hooten, Laurice Record, MD      . menthol-cetylpyridinium (CEPACOL) lozenge 3 mg  1 lozenge Oral PRN Hooten, Laurice Record, MD       Or  . phenol (CHLORASEPTIC) mouth spray 1 spray  1 spray Mouth/Throat PRN Hooten, Laurice Record, MD      . metoCLOPramide (REGLAN) tablet 10 mg  10 mg Oral TID AC & HS Hooten, Laurice Record, MD      . multivitamin with minerals tablet 1 tablet  1 tablet Oral Daily Hooten, Laurice Record, MD      . ondansetron (ZOFRAN) tablet 4 mg  4 mg Oral Q6H PRN Hooten, Laurice Record, MD       Or  . ondansetron (ZOFRAN) injection 4 mg  4 mg Intravenous Q6H PRN Hooten, Laurice Record, MD      . oxyCODONE (Oxy IR/ROXICODONE) immediate release tablet 5-10 mg  5-10 mg Oral Q4H PRN Hooten, Laurice Record, MD      . pantoprazole (PROTONIX) EC tablet 40 mg  40 mg Oral BID Hooten, Laurice Record, MD      . Potassium TABS 99 mg  99 mg Oral Daily PRN Hooten, Laurice Record, MD      . senna-docusate (Senokot-S) tablet 1 tablet  1 tablet Oral BID Hooten, Laurice Record, MD      . sodium phosphate (FLEET) 7-19 GM/118ML enema 1 enema  1 enema Rectal Once PRN Hooten,  Laurice Record, MD      . tamsulosin (FLOMAX) capsule 0.4 mg  0.4 mg Oral Daily Hooten, Laurice Record, MD      . Vitamin B-12 SUBL 5,000 mcg  5,000 mcg Sublingual Daily Hooten, Laurice Record, MD      . Vitamin C CHEW 2 tablet  2 tablet Oral Daily Hooten, Laurice Record, MD         Discharge Medications: Please see discharge summary for a list of discharge medications.  Relevant Imaging Results:  Relevant Lab Results:   Additional Information  (SSN: [id number])  Ola Fawver, Veronia Beets, LCSW

## 2016-12-31 NOTE — Op Note (Signed)
OPERATIVE NOTE  DATE OF SURGERY:  12/31/2016  PATIENT NAME:  [name]   DOB: 1934/08/05  MRN: [id number]  PRE-OPERATIVE DIAGNOSIS: Degenerative arthrosis of the left knee, primary  POST-OPERATIVE DIAGNOSIS:  Same  PROCEDURE:  Left total knee arthroplasty using computer-assisted navigation  SURGEON:  Marciano Sequin. M.D.  ASSISTANT:  Vance Peper, PA (present and scrubbed throughout the case, critical for assistance with exposure, retraction, instrumentation, and closure)  ANESTHESIA: spinal  ESTIMATED BLOOD LOSS: 50 mL  FLUIDS REPLACED: 1200 mL of crystalloid  TOURNIQUET TIME: 85 minutes  DRAINS: 2 medium Hemovac drains  SOFT TISSUE RELEASES: Anterior cruciate ligament, posterior cruciate ligament, deep medial collateral ligament, patellofemoral ligament  IMPLANTS UTILIZED: DePuy Attune size 8 posterior stabilized femoral component (cemented), size 8 rotating platform tibial component (cemented), 41 mm medialized dome patella (cemented), and a 5 mm stabilized rotating platform polyethylene insert.  INDICATIONS FOR SURGERY: [name] is a 82 y.o. year old male with a long history of progressive knee pain. X-rays demonstrated severe degenerative changes in tricompartmental fashion. The patient had not seen any significant improvement despite conservative nonsurgical intervention. After discussion of the risks and benefits of surgical intervention, the patient expressed understanding of the risks benefits and agree with plans for total knee arthroplasty.   The risks, benefits, and alternatives were discussed at length including but not limited to the risks of infection, bleeding, nerve injury, stiffness, blood clots, the need for revision surgery, cardiopulmonary complications, among others, and they were willing to proceed.  PROCEDURE IN DETAIL: The patient was brought into the operating room and, after adequate spinal anesthesia was achieved, a tourniquet was placed on  the patient's upper thigh. The patient's knee and leg were cleaned and prepped with alcohol and DuraPrep and draped in the usual sterile fashion. A "timeout" was performed as per usual protocol. The lower extremity was exsanguinated using an Esmarch, and the tourniquet was inflated to 300 mmHg. An anterior longitudinal incision was made followed by a standard mid vastus approach. The deep fibers of the medial collateral ligament were elevated in a subperiosteal fashion off of the medial flare of the tibia so as to maintain a continuous soft tissue sleeve. The patella was subluxed laterally and the patellofemoral ligament was incised. Inspection of the knee demonstrated severe degenerative changes with full-thickness loss of articular cartilage. Osteophytes were debrided using a rongeur. Anterior and posterior cruciate ligaments were excised. Two 4.0 mm Schanz pins were inserted in the femur and into the tibia for attachment of the array of trackers used for computer-assisted navigation. Hip center was identified using a circumduction technique. Distal landmarks were mapped using the computer. The distal femur and proximal tibia were mapped using the computer. The distal femoral cutting guide was positioned using computer-assisted navigation so as to achieve a 5 distal valgus cut. The femur was sized and it was felt that a size 8 femoral component was appropriate. A size 8 femoral cutting guide was positioned and the anterior cut was performed and verified using the computer. This was followed by completion of the posterior and chamfer cuts. Femoral cutting guide for the central box was then positioned in the center box cut was performed.  Attention was then directed to the proximal tibia. Medial and lateral menisci were excised. The extramedullary tibial cutting guide was positioned using computer-assisted navigation so as to achieve a 0 varus-valgus alignment and 3 posterior slope. The cut was performed and  verified using the computer. The proximal tibia  was sized and it was felt that a size 8 tibial tray was appropriate. Tibial and femoral trials were inserted followed by insertion of a 5 mm polyethylene insert. This allowed for excellent mediolateral soft tissue balancing both in flexion and in full extension. Finally, the patella was cut and prepared so as to accommodate a 41 mm medialized dome patella. A patella trial was placed and the knee was placed through a range of motion with excellent patellar tracking appreciated. The femoral trial was removed after debridement of posterior osteophytes. The central post-hole for the tibial component was reamed followed by insertion of a keel punch. Tibial trials were then removed. Cut surfaces of bone were irrigated with copious amounts of normal saline with antibiotic solution using pulsatile lavage and then suctioned dry. Polymethylmethacrylate cement was prepared in the usual fashion using a vacuum mixer. Cement was applied to the cut surface of the proximal tibia as well as along the undersurface of a size 8 rotating platform tibial component. Tibial component was positioned and impacted into place. Excess cement was removed using [employer] fast streamer. Cement was then applied to the cut surfaces of the femur as well as along the posterior flanges of the size 8 femoral component. The femoral component was positioned and impacted into place. Excess cement was removed using [employer] fast streamer. A 5 mm polyethylene trial was inserted and the knee was brought into full extension with steady axial compression applied. Finally, cement was applied to the backside of a 41 mm medialized dome patella and the patellar component was positioned and patellar clamp applied. Excess cement was removed using [employer] fast streamer. After adequate curing of the cement, the tourniquet was deflated after a total tourniquet time of 85 minutes. Hemostasis was achieved using electrocautery. The knee was  irrigated with copious amounts of normal saline with antibiotic solution using pulsatile lavage and then suctioned dry. 20 mL of 1.3% Exparel and 60 mL of 0.25% Marcaine in 40 mL of normal saline was injected along the posterior capsule, medial and lateral gutters, and along the arthrotomy site. A 5 mm stabilized rotating platform polyethylene insert was inserted and the knee was placed through a range of motion with excellent mediolateral soft tissue balancing appreciated and excellent patellar tracking noted. 2 medium drains were placed in the wound bed and brought out through separate stab incisions. The medial parapatellar portion of the incision was reapproximated using interrupted sutures of #1 Vicryl. Subcutaneous tissue was approximated in layers using first #0 Vicryl followed #2-0 Vicryl. The skin was approximated with skin staples. A sterile dressing was applied.  The patient tolerated the procedure well and was transported to the recovery room in stable condition.    James P. Holley Bouche., M.D.

## 2016-12-31 NOTE — Anesthesia Post-op Follow-up Note (Signed)
Anesthesia QCDR form completed.        

## 2016-12-31 NOTE — Anesthesia Procedure Notes (Signed)
Spinal  Patient location during procedure: OR Start time: 12/31/2016 7:18 AM End time: 12/31/2016 7:23 AM Staffing Performed: resident/CRNA  Preanesthetic Checklist Completed: patient identified, site marked, surgical consent, pre-op evaluation, timeout performed, IV checked, risks and benefits discussed and monitors and equipment checked Spinal Block Patient position: sitting Prep: Betadine Patient monitoring: heart rate, continuous pulse ox, blood pressure and cardiac monitor Approach: midline Location: L4-5 Injection technique: single-shot Needle Needle type: Introducer and Pencan  Needle gauge: 24 G Needle length: 9 cm Additional Notes Negative paresthesia. Negative blood return. Positive free-flowing CSF. Expiration date of kit checked and confirmed. Patient tolerated procedure well, without complications.

## 2016-12-31 NOTE — H&P (Signed)
The patient has been re-examined, and the chart reviewed, and there have been no interval changes to the documented history and physical.    The risks, benefits, and alternatives have been discussed at length. The patient expressed understanding of the risks benefits and agreed with plans for surgical intervention.  Bashar Milam P. Raigen Jagielski, Jr. M.D.    

## 2016-12-31 NOTE — Evaluation (Signed)
Physical Therapy Evaluation Patient Details Name: [name] MRN: [id number] DOB: January 14, 1935 Today's Date: 12/31/2016   History of Present Illness  82 yo male with onset of L TKA after history of failed OA management, and increasing debility at home per wife.  PMHx:  sleep apnea, OA, falls, a-fib with RVR, dementia, rib fractures, rotator cuff tear, anemia, CAD  Clinical Impression  Pt is up to walk with assistance and noted his level of ability to be better than originally expected.  Pt is slow to move but mainly due to dementia, and is cooperative to work with PT.  Will follow him up with assistance to walk and exercise LLE and then progress to SNF for recovery of all his mobility.  He has a level home with ramp, a SPC, shower bench, should be able to transition there after very short SNF stay.      Follow Up Recommendations SNF    Equipment Recommendations  None recommended by PT    Recommendations for Other Services       Precautions / Restrictions Precautions Precautions: Fall (telemtry) Precaution Comments: ck pulses and O2 sats Required Braces or Orthoses: Knee Immobilizer - Left (if needed, but could do 10 SLR's with cues) Knee Immobilizer - Left: On when out of bed or walking (if needed) Restrictions Weight Bearing Restrictions: No Other Position/Activity Restrictions: bone foam when in bed, has polar care and drain from surgery, foley      Mobility  Bed Mobility Overal bed mobility: Needs Assistance Bed Mobility: Supine to Sit;Sit to Supine     Supine to sit: Min assist Sit to supine: Min assist   General bed mobility comments: assisted with bed rail and under trunk, bed pad for LLE  Transfers Overall transfer level: Needs assistance Equipment used: Rolling walker (2 wheeled);1 person hand held assist Transfers: Sit to/from Stand Sit to Stand: Mod assist         General transfer comment: cued hand placement and assisted with his power  up  Ambulation/Gait Ambulation/Gait assistance: Min assist Ambulation Distance (Feet): 8 Feet Assistive device: Rolling walker (2 wheeled);1 person hand held assist Gait Pattern/deviations: Step-to pattern;Wide base of support;Trunk flexed;Antalgic;Decreased stride length Gait velocity: reduced Gait velocity interpretation: Below normal speed for age/gender General Gait Details: sidesteps up side of bed wiht RW and cued assistance   Stairs            Wheelchair Mobility    Modified Rankin (Stroke Patients Only)       Balance Overall balance assessment: Needs assistance Sitting-balance support: Feet supported Sitting balance-Leahy Scale: Fair     Standing balance support: Bilateral upper extremity supported;During functional activity Standing balance-Leahy Scale: Poor Standing balance comment: sits with minimal warning, related to pain                             Pertinent Vitals/Pain Pain Assessment: Faces Faces Pain Scale: Hurts little more Pain Location: L knee with stretches Pain Intervention(s): Limited activity within patient's tolerance;Monitored during session;Premedicated before session;Repositioned;Ice applied    Home Living Family/patient expects to be discharged to:: Skilled nursing facility Living Arrangements: Spouse/significant other                    Prior Function Level of Independence: Needs assistance   Gait / Transfers Assistance Needed: RW with assist from wife  ADL's / Homemaking Assistance Needed: wife assists with home, pt could toilet himself  Hand Dominance   Dominant Hand: Right    Extremity/Trunk Assessment   Upper Extremity Assessment Upper Extremity Assessment: Overall WFL for tasks assessed    Lower Extremity Assessment Lower Extremity Assessment: Generalized weakness    Cervical / Trunk Assessment Cervical / Trunk Assessment: Normal  Communication   Communication: No difficulties   Cognition Arousal/Alertness: Awake/alert Behavior During Therapy: Flat affect Overall Cognitive Status: History of cognitive impairments - at baseline                                        General Comments General comments (skin integrity, edema, etc.): has clean dry surgical bandage with polar care underneath it    Exercises     Assessment/Plan    PT Assessment Patient needs continued PT services  PT Problem List Decreased strength;Decreased range of motion;Decreased activity tolerance;Decreased balance;Decreased mobility;Decreased coordination;Decreased cognition;Decreased knowledge of use of DME;Decreased safety awareness;Decreased knowledge of precautions;Cardiopulmonary status limiting activity;Decreased skin integrity;Pain       PT Treatment Interventions DME instruction;Gait training;Functional mobility training;Therapeutic activities;Therapeutic exercise;Balance training;Neuromuscular re-education;Patient/family education    PT Goals (Current goals can be found in the Care Plan section)  Acute Rehab PT Goals Patient Stated Goal: to get walking and get home PT Goal Formulation: With patient/family Time For Goal Achievement: 01/14/17 Potential to Achieve Goals: Good    Frequency Min 2X/week   Barriers to discharge Other (comment) (requires more assist, more debilitated than wife can do)      Co-evaluation               AM-PAC PT "6 Clicks" Daily Activity  Outcome Measure Difficulty turning over in bed (including adjusting bedclothes, sheets and blankets)?: Unable Difficulty moving from lying on back to sitting on the side of the bed? : Unable Difficulty sitting down on and standing up from a chair with arms (e.g., wheelchair, bedside commode, etc,.)?: Unable Help needed moving to and from a bed to chair (including a wheelchair)?: A Little Help needed walking in hospital room?: A Little Help needed climbing 3-5 steps with a railing? : Total 6  Click Score: 10    End of Session Equipment Utilized During Treatment: Gait belt Activity Tolerance: Patient tolerated treatment well;Patient limited by fatigue;Patient limited by pain Patient left: in bed;with call bell/phone within reach;with bed alarm set;with family/visitor present Nurse Communication: Mobility status PT Visit Diagnosis: Unsteadiness on feet (R26.81);Muscle weakness (generalized) (M62.81);Difficulty in walking, not elsewhere classified (R26.2);Pain Pain - Right/Left: Left Pain - part of body: Knee    Time: 9629-5284 PT Time Calculation (min) (ACUTE ONLY): 59 min   Charges:   PT Evaluation $PT Eval Moderate Complexity: 1 Mod PT Treatments $Gait Training: 8-22 mins $Therapeutic Activity: 23-37 mins   PT G Codes:   PT G-Codes **NOT FOR INPATIENT CLASS** Functional Assessment Tool Used: AM-PAC 6 Clicks Basic Mobility    Ramond Dial 12/31/2016, 5:41 PM   Mee Hives, PT MS Acute Rehab Dept. Number: [place] [facility] [place]

## 2016-12-31 NOTE — Progress Notes (Addendum)
PHARMACIST - PHYSICIAN ORDER COMMUNICATION  CONCERNING: P&T Medication Policy on Herbal Medications  DESCRIPTION:  This patient's order for:  Flax seed oil and Biotin  has been noted.  This product(s) is classified as an "herbal" or natural product. Due to a lack of definitive safety studies or FDA approval, nonstandard manufacturing practices, plus the potential risk of unknown drug-drug interactions while on inpatient medications, the Pharmacy and Therapeutics Committee does not permit the use of "herbal" or natural products of this type within [facility].   ACTION TAKEN: The pharmacy department is unable to verify this order at this time and your patient has been informed of this safety policy. Please reevaluate patient's clinical condition at discharge and address if the herbal or natural product(s) should be resumed at that time.   Chinita Greenland PharmD Clinical Pharmacist 12/31/2016

## 2016-12-31 NOTE — Progress Notes (Signed)
Wife to bring CPAP machine in for him to use HS for sleep apnea. Received verbal orders from Dr. Rudene Christians for use.

## 2017-01-01 ENCOUNTER — Encounter: Payer: Self-pay | Admitting: Orthopedic Surgery

## 2017-01-01 LAB — BASIC METABOLIC PANEL
Anion gap: 5 (ref 5–15)
BUN: 21 mg/dL — AB (ref 6–20)
CALCIUM: 7.8 mg/dL — AB (ref 8.9–10.3)
CO2: 25 mmol/L (ref 22–32)
CREATININE: 1.34 mg/dL — AB (ref 0.61–1.24)
Chloride: 108 mmol/L (ref 101–111)
GFR calc Af Amer: 56 mL/min — ABNORMAL LOW (ref >60)
GFR, EST NON AFRICAN AMERICAN: 48 mL/min — AB (ref >60)
GLUCOSE: 146 mg/dL — AB (ref 65–99)
POTASSIUM: 3.9 mmol/L (ref 3.5–5.1)
Sodium: 138 mmol/L (ref 135–145)

## 2017-01-01 LAB — CBC
HCT: 34 % — ABNORMAL LOW (ref 40.0–52.0)
Hemoglobin: 11.9 g/dL — ABNORMAL LOW (ref 13.0–18.0)
MCH: 33.5 pg (ref 26.0–34.0)
MCHC: 35 g/dL (ref 32.0–36.0)
MCV: 95.7 fL (ref 80.0–100.0)
PLATELETS: 134 10*3/uL — AB (ref 150–440)
RBC: 3.55 MIL/uL — ABNORMAL LOW (ref 4.40–5.90)
RDW: 13 % (ref 11.5–14.5)
WBC: 13.2 10*3/uL — ABNORMAL HIGH (ref 3.8–10.6)

## 2017-01-01 MED ORDER — OXYCODONE HCL 5 MG PO TABS: 5.0000 mg | ORAL_TABLET | ORAL | 0 refills | Status: AC | PRN

## 2017-01-01 MED ORDER — ENOXAPARIN SODIUM 30 MG/0.3ML ~~LOC~~ SOLN: 30.0000 mg | Freq: Two times a day (BID) | SUBCUTANEOUS | 0 refills | Status: AC

## 2017-01-01 NOTE — Progress Notes (Signed)
Health Team authorization has been received, auth # [id number]. Plan is for patient to D/C to [facility] tomorrow pending medical clearance. [facility] liaison is aware of above. Clinical Social Worker (CSW) met with patient's wife [name] at bedside and made her aware of above. CSW will continue to follow and assist as needed.   [employer], LCSW [phone]

## 2017-01-01 NOTE — Evaluation (Signed)
Occupational Therapy Evaluation Patient Details Name: [name] MRN: [id number] DOB: [birth year] Today's Date: 01/01/2017    History of Present Illness Pt. is an 82 yo male who was admitted to [facility] for a Left TKA. Pt. PMHx includes: sleep apnea, OA, falls, a-fib with RVR, dementia, rib fractures, rotator cuff tear, anemia, CAD   Clinical Impression   Pt. Is an 82 y.o. Male who was admitted to [facility] for a left TKA. Pt. has cognitive deficits from Dementia at baseline. Pt.'s wife reports pt. The deficits worsened after sustaining a fall at church last year. Pt. resides with his wife, who assists with daily ADL care for the pt. Pt.'s wife reports her health is declining, and is limited with how much she will be able to assist the pt. with ADLs. Pt.'s wife reports they plan to move to [place] at the end of the year to be closer to her son. Pt. Presents with weakness, cognitive impairments, limited activity tolerance, pain, and limited functional mobility which hinder his ability to complete ADLs, and IADLs. Pt. Will benefit from skilled OT services for ADL training, A/E training, UE there. Ex., there. Act., and pt./familiy education about cognitive compensatory strategies, home modification, and DME. Pt. Needs SNF level of care at discharge with follow-up OT services.    Follow Up Recommendations  SNF    Equipment Recommendations       Recommendations for Other Services       Precautions / Restrictions                                                      ADL either performed or assessed with clinical judgement   ADL Overall ADL's : Needs assistance/impaired Eating/Feeding: Set up   Grooming: Set up;Minimal assistance   Upper Body Bathing: Moderate assistance   Lower Body Bathing: Maximal assistance   Upper Body Dressing : Minimal assistance   Lower Body Dressing: Maximal assistance               Functional mobility during ADLs: Minimal  assistance General ADL Comments: Pt. and wife education was provided about OT services, knee surgery, positioning, and general reacher use.     Vision         Perception     Praxis      Pertinent Vitals/Pain Pain Assessment: 0-10 Pain Score: 3  Pain Location: Left knee Pain Intervention(s): Limited activity within patient's tolerance;Monitored during session;Premedicated before session;Repositioned     Hand Dominance Right   Extremity/Trunk Assessment Upper Extremity Assessment Upper Extremity Assessment: Overall WFL for tasks assessed           Communication Communication Communication: No difficulties   Cognition Arousal/Alertness: Awake/alert Behavior During Therapy: Flat affect Overall Cognitive Status: History of cognitive impairments - at baseline                                     General Comments       Exercises     Shoulder Instructions      Home Living Family/patient expects to be discharged to:: Skilled nursing facility Living Arrangements: Spouse/significant other  Prior Functioning/Environment Level of Independence: Needs assistance    ADL's / Homemaking Assistance Needed: Wife assists with ADLs, morning care, and IADLs.             OT Problem List: Decreased strength;Decreased range of motion;Pain;Decreased knowledge of use of DME or AE;Decreased safety awareness;Decreased cognition;Decreased activity tolerance      OT Treatment/Interventions: Self-care/ADL training;Therapeutic exercise;Energy conservation;Patient/family education;Therapeutic activities;DME and/or AE instruction    OT Goals(Current goals can be found in the care plan section) Acute Rehab OT Goals Patient Stated Goal: To go to rehab OT Goal Formulation: With patient/family Potential to Achieve Goals: Good  OT Frequency: Min 2X/week   Barriers to D/C:            Co-evaluation               AM-PAC PT "6 Clicks" Daily Activity     Outcome Measure Help from another person eating meals?: A Little Help from another person taking care of personal grooming?: A Little Help from another person toileting, which includes using toliet, bedpan, or urinal?: A Lot Help from another person bathing (including washing, rinsing, drying)?: A Lot Help from another person to put on and taking off regular upper body clothing?: A Little Help from another person to put on and taking off regular lower body clothing?: A Lot 6 Click Score: 15   End of Session    Activity Tolerance: Patient tolerated treatment well Patient left: in bed;with call bell/phone within reach;with bed alarm set  OT Visit Diagnosis: Repeated falls (R29.6);Muscle weakness (generalized) (M62.81)                Time: 9323-5573 OT Time Calculation (min): 25 min Charges:  OT General Charges $OT Visit: 1 Visit OT Evaluation $OT Eval Moderate Complexity: 1 Mod G-Codes: OT G-codes **NOT FOR INPATIENT CLASS** Functional Assessment Tool Used: AM-PAC 6 Clicks Daily Activity Functional Limitation: Self care Self Care Current Status (U2025): At least 60 percent but less than 80 percent impaired, limited or restricted Self Care Goal Status (K2706): At least 1 percent but less than 20 percent impaired, limited or restricted  Harrel Carina, MS, OTR/L Harrel Carina, MS, OTR/L 01/01/2017, 9:44 AM

## 2017-01-01 NOTE — Progress Notes (Signed)
Physical Therapy Treatment Patient Details Name: [name] MRN: [id number] DOB: 1934-09-17 Today's Date: 01/01/2017    History of Present Illness Pt is an 82 yo male who was admitted to [facility] for a Left TKA 12/31/16.  PMHx includes: sleep apnea, OA, falls, a-fib with RVR, dementia, rib fractures, rotator cuff tear, anemia, CAD    PT Comments    Pt appearing more fatigued this afternoon and requiring increased assist for bed mobility, transfers, and ambulation with RW (see below for assist levels/details).  Pt pleasantly confused during session and requiring consistent vc's for activities to maintain safety and improve mobility technique.  Will continue to progress pt with strengthening, ROM, and decreasing assist levels with functional mobility next session.    Follow Up Recommendations  SNF     Equipment Recommendations  Rolling walker with 5" wheels    Recommendations for Other Services       Precautions / Restrictions Precautions Precautions: Fall Precaution Comments: Monitor O2 and HR Required Braces or Orthoses: Knee Immobilizer - Left Knee Immobilizer - Left: Discontinue once straight leg raise with < 10 degree lag Restrictions Weight Bearing Restrictions: Yes LLE Weight Bearing: Weight bearing as tolerated    Mobility  Bed Mobility Overal bed mobility: Needs Assistance Bed Mobility: Supine to Sit;Sit to Supine     Supine to sit: Mod assist Sit to supine: Mod assist   General bed mobility comments: assist for trunk and L LE supine to/from sit with vc's for technique and to use bed rail; difficulty with sequencing  Transfers Overall transfer level: Needs assistance Equipment used: Rolling walker (2 wheeled) Transfers: Sit to/from Stand Sit to Stand: Mod assist         General transfer comment: Increased effort and time to perform; vc's for UE and LE placement required; assist to initiate stand and come to upright posture  Ambulation/Gait Ambulation/Gait  assistance: Min assist Ambulation Distance (Feet): 90 Feet Assistive device: Rolling walker (2 wheeled)   Gait velocity: decreased   General Gait Details: decreased B step length; decreased stance time L LE; consistent vc's required to stay closer to RW and for upright posture; min assist required to keep RW closer to pt   Stairs            Wheelchair Mobility    Modified Rankin (Stroke Patients Only)       Balance Overall balance assessment: Needs assistance Sitting-balance support: No upper extremity supported;Feet supported Sitting balance-Leahy Scale: Good Sitting balance - Comments: sitting reaching within BOS   Standing balance support: No upper extremity supported Standing balance-Leahy Scale: Fair Standing balance comment: static standing while urinating                            Cognition Arousal/Alertness: Awake/alert Behavior During Therapy: Flat affect Overall Cognitive Status:  (Oriented to person only)                                        Exercises Total Joint Exercises Short Arc Quad: Strengthening;AROM;Right;AAROM;Left;10 reps;Supine Heel Slides: Strengthening;AROM;Right;AAROM;Left;10 reps;Supine Hip ABduction/ADduction: Strengthening;AROM;Right;AAROM;Left;10 reps;Supine  Vc's and tactile cues for technique of ex's required.    General Comments General comments (skin integrity, edema, etc.): Surgical bandage, polar, and hemovac in place.  Pt agreeable to PT session.  Pt's wife present during session.      Pertinent Vitals/Pain Pain Assessment:  0-10 Pain Score: 4  Pain Location: Left knee Pain Intervention(s): Limited activity within patient's tolerance;Monitored during session;Repositioned (Nursing tech present end of session cleaning pt up and reported she would apply polar care)    Home Living                      Prior Function            PT Goals (current goals can now be found in the care plan  section) Acute Rehab PT Goals Patient Stated Goal: to improve mobility PT Goal Formulation: With patient Time For Goal Achievement: 01/14/17 Potential to Achieve Goals: Good Progress towards PT goals: Progressing toward goals    Frequency    BID      PT Plan Current plan remains appropriate    Co-evaluation              AM-PAC PT "6 Clicks" Daily Activity  Outcome Measure  Difficulty turning over in bed (including adjusting bedclothes, sheets and blankets)?: A Little Difficulty moving from lying on back to sitting on the side of the bed? : Unable Difficulty sitting down on and standing up from a chair with arms (e.g., wheelchair, bedside commode, etc,.)?: Unable Help needed moving to and from a bed to chair (including a wheelchair)?: A Lot Help needed walking in hospital room?: A Little Help needed climbing 3-5 steps with a railing? : A Lot 6 Click Score: 12    End of Session Equipment Utilized During Treatment: Gait belt Activity Tolerance: Patient tolerated treatment well Patient left: in bed;with call bell/phone within reach;with nursing/sitter in room (Nursing tech present changing pt end of session (pt incontinent in briefs) and nursing tech reported she would set pt up with polar care, towel rolls, and SCD's when she was done changing pt) Nurse Communication: Mobility status;Precautions PT Visit Diagnosis: Other abnormalities of gait and mobility (R26.89);Muscle weakness (generalized) (M62.81);Difficulty in walking, not elsewhere classified (R26.2);Pain Pain - Right/Left: Left Pain - part of body: Knee     Time: 6629-4765 PT Time Calculation (min) (ACUTE ONLY): 40 min  Charges:  $Gait Training: 8-22 mins $Therapeutic Exercise: 8-22 mins $Therapeutic Activity: 8-22 mins                    G CodesLeitha Bleak, PT 01/01/17, 4:25 PM [phone]

## 2017-01-01 NOTE — Anesthesia Postprocedure Evaluation (Signed)
Anesthesia Post Note  Patient: [name]  Procedure(s) Performed: Procedure(s) (LRB): COMPUTER ASSISTED TOTAL KNEE ARTHROPLASTY (Left)  Patient location during evaluation: Nursing Unit Anesthesia Type: Spinal Level of consciousness: awake and alert and oriented Pain management: satisfactory to patient Vital Signs Assessment: post-procedure vital signs reviewed and stable Respiratory status: respiratory function stable Cardiovascular status: stable Postop Assessment: no headache, no backache, spinal receding, patient able to bend at knees, adequate PO intake and no signs of nausea or vomiting Anesthetic complications: no     Last Vitals:  Vitals:   12/31/16 2354 01/01/17 0435  BP: (!) 127/57 (!) 128/42  Pulse: 66 (!) 45  Resp: 18 18  Temp: 36.6 C 36.9 C  SpO2: 98% 99%    Last Pain:  Vitals:   01/01/17 0604  TempSrc:   PainSc: 2                  Blima Singer

## 2017-01-01 NOTE — Discharge Instructions (Signed)

## 2017-01-01 NOTE — Progress Notes (Signed)
   Subjective: 1 Day Post-Op Procedure(s) (LRB): COMPUTER ASSISTED TOTAL KNEE ARTHROPLASTY (Left) Patient reports pain as 4 on 0-10 scale.   Patient is well, and has had no acute complaints or problems We will start therapy today.  Plan is to go Rehab after hospital stay. no nausea and no vomiting Patient denies any chest pains or shortness of breath. Pt resting well. No c/o's. Appears to be in very good spirit  Objective: Vital signs in last 24 hours: Temp:  [97.8 F (36.6 C)-98.5 F (36.9 C)] 98.5 F (36.9 C) (08/30 0435) Pulse Rate:  [45-66] 45 (08/30 0435) Resp:  [12-24] 18 (08/30 0435) BP: (95-146)/(42-86) 128/42 (08/30 0435) SpO2:  [96 %-100 %] 99 % (08/30 0435) Weight:  [85.3 kg (188 lb)] 85.3 kg (188 lb) (08/29 1250) Heels are non tender and elevated off the bed using rolled towels Bone foam in place  Intake/Output from previous day: 08/29 0701 - 08/30 0700 In: 3990 [P.O.:720; I.V.:2670; IV Piggyback:600] Out: 3325 [Urine:2625; Drains:650; Blood:50] Intake/Output this shift: Total I/O In: 2070 [I.V.:1470; IV Piggyback:600] Out: 1250 [Urine:600; Drains:650]   Recent Labs  01/01/17 0337  HGB 11.9*    Recent Labs  01/01/17 0337  WBC 13.2*  RBC 3.55*  HCT 34.0*  PLT 134*    Recent Labs  01/01/17 0337  NA 138  K 3.9  CL 108  CO2 25  BUN 21*  CREATININE 1.34*  GLUCOSE 146*  CALCIUM 7.8*   No results for input(s): LABPT, INR in the last 72 hours.  EXAM General - Patient is Alert, Appropriate and Oriented Extremity - Neurologically intact Neurovascular intact Sensation intact distally Intact pulses distally Dorsiflexion/Plantar flexion intact Compartment soft Dressing - dressing C/D/I Motor Function - intact, moving foot and toes well on exam. Able to do SLR on own without hesitation.  Past Medical History:  Diagnosis Date  . Absolute anemia 09/11/2014  . Anxiety   . Arthritis   . Atrial fibrillation ([place] 01/23/2014  . BP (high blood  pressure) 11/07/2013  . Carotid artery disease ([place]) 09/11/2014  . Complete rotator cuff rupture of left shoulder 12/30/2013  . Dementia 09/11/2014  . Depression   . Heart murmur   . HLD (hyperlipidemia) 01/23/2014  . Hypertension   . Obstructive apnea 11/07/2013  . Personal history of other diseases of the circulatory system 01/23/2014  . Sleep apnea   . 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   Weight as of this encounter: 85.3 kg (188 lb). Advance diet Up with therapy D/C IV fluids Plan for discharge tomorrow Discharge to SNF  Labs: reviewed DVT Prophylaxis - Lovenox, Foot Pumps and TED hose Weight-Bearing as tolerated to left leg D/C O2 and Pulse OX and try on Room Air Begin working on a bowel movement  Tory Mckissack R. Hubbard [place] 01/01/2017, 6:55 AM

## 2017-01-01 NOTE — Discharge Summary (Signed)
Physician Discharge Summary  Patient ID: [name] MRN: [id number] DOB/AGE: [birth year] 82 y.o.  Admit date: 12/31/2016 Discharge date: 01/02/2017  Admission Diagnoses:  primary osteoarthritis of left knee   Discharge Diagnoses: Patient Active Problem List   Diagnosis Date Noted  . S/P total knee arthroplasty 12/31/2016  . Lower limb ulcer, ankle, left, limited to breakdown of skin ([place]) 11/18/2016  . Fall 02/18/2016  . Scalp laceration 02/18/2016  . Splenic laceration 02/18/2016  . Acute blood loss anemia 02/18/2016  . Multiple rib fractures 02/16/2016  . Mild dementia 04/10/2015  . Aggrieved 03/02/2015  . Absolute anemia 09/11/2014  . Carotid artery disease ([place]) 09/11/2014  . Dementia 09/11/2014  . History of repair of rotator cuff 03/02/2014  . Atrial fibrillation with RVR ([place]) 01/23/2014  . Personal history of other diseases of the circulatory system 01/23/2014  . HLD (hyperlipidemia) 01/23/2014  . Complete rotator cuff rupture of left shoulder 12/30/2013  . BP (high blood pressure) 11/07/2013  . Amnesia 11/07/2013  . Obstructive apnea 11/07/2013    Past Medical History:  Diagnosis Date  . Absolute anemia 09/11/2014  . Anxiety   . Arthritis   . Atrial fibrillation ([place]) 01/23/2014  . BP (high blood pressure) 11/07/2013  . Carotid artery disease ([place]) 09/11/2014  . Complete rotator cuff rupture of left shoulder 12/30/2013  . Dementia 09/11/2014  . Depression   . Heart murmur   . HLD (hyperlipidemia) 01/23/2014  . Hypertension   . Obstructive apnea 11/07/2013  . Personal history of other diseases of the circulatory system 01/23/2014  . Sleep apnea   . 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 male who was admitted 12/31/2016 with a diagnosis of degenerative arthrosis left knee and went to the  operating room on 12/31/2016 and underwent the above named procedures.    Surgeries:Procedure(s): COMPUTER ASSISTED TOTAL KNEE ARTHROPLASTY on 12/31/2016  PRE-OPERATIVE DIAGNOSIS: Degenerative arthrosis of the left knee, primary  POST-OPERATIVE DIAGNOSIS:  Same  PROCEDURE:  Left total knee arthroplasty using computer-assisted navigation  SURGEON:  Marciano Sequin. M.D.  ASSISTANT:  Vance Peper, PA (present and scrubbed throughout the case, critical for assistance with exposure, retraction, instrumentation, and closure)  ANESTHESIA: spinal  ESTIMATED BLOOD LOSS: 50 mL  FLUIDS REPLACED: 1200 mL of crystalloid  TOURNIQUET TIME: 85 minutes  DRAINS: 2 medium Hemovac drains  SOFT TISSUE RELEASES: Anterior cruciate ligament, posterior cruciate ligament, deep medial collateral ligament, patellofemoral ligament  IMPLANTS UTILIZED: DePuy Attune size 8 posterior stabilized femoral component (cemented), size 8 rotating platform tibial component (cemented), 41 mm medialized dome patella (cemented), and a 5 mm stabilized rotating platform polyethylene insert.  INDICATIONS FOR SURGERY: [name] is a 82 y.o. year old male with a long history of progressive knee pain. X-rays demonstrated severe degenerative changes in tricompartmental fashion. The patient had not seen any significant improvement despite conservative nonsurgical intervention. After discussion of the risks and benefits of surgical intervention, the patient expressed understanding of the risks benefits and agree with plans for total knee arthroplasty.   The risks, benefits, and alternatives were discussed at length including but not limited to the risks of infection, bleeding, nerve injury, stiffness, blood clots, the need for revision surgery, cardiopulmonary complications, among others, and they were willing to proceed.  Patient tolerated the surgery well. No  complications .Patient was taken to PACU where she was  stabilized and then transferred to the orthopedic floor.  Patient started on Lovenox 30mg  q 12 hrs. Foot pumps applied bilaterally at 80 mm hgb. Heels elevated off bed with rolled towels. No evidence of DVT. Calves non tender. Negative Homan. Physical therapy started on day #1 for gait training and transfer with OT starting on  day #1 for ADL and assisted devices. Patient has done well with therapy. Ambulated 90 feet upon being discharged.  Patient's IV And Foley were discontinued on day #1 with Hemovac being discontinued on day #2. Dressing was changed on day 2 prior to patient being discharged   He was given perioperative antibiotics:  Anti-infectives    Start     Dose/Rate Route Frequency Ordered Stop   12/31/16 1400  ceFAZolin (ANCEF) IVPB 2g/100 mL premix     2 g 200 mL/hr over 30 Minutes Intravenous Every 6 hours 12/31/16 1225 01/01/17 0843   12/31/16 0616  ceFAZolin (ANCEF) 2-4 GM/100ML-% IVPB    Comments:  Rexanne Mano   : cabinet override      12/31/16 0616 12/31/16 0736   12/31/16 0108  ceFAZolin (ANCEF) IVPB 2g/100 mL premix     2 g 200 mL/hr over 30 Minutes Intravenous On call to O.R. 12/31/16 6213 12/31/16 0746    .  He was fitted with AV 1 compression foot pump devices, instructed on heel pumps, early ambulation, and fitted with TED stockings bilaterally for DVT prophylaxis.  He benefited maximally from the hospital stay and there were no complications.    Recent vital signs:  Vitals:   01/01/17 1800 01/01/17 1929  BP: (!) 143/80 (!) 119/52  Pulse: 67 (!) 56  Resp: 16 19  Temp: 98 F (36.7 C) 98.5 F (36.9 C)  SpO2: 96% 100%    Recent laboratory studies:  Lab Results  Component Value Date   HGB 11.4 (L) 01/02/2017   HGB 11.9 (L) 01/01/2017   HGB 13.8 12/24/2016   Lab Results  Component Value Date   WBC 7.8 01/02/2017   PLT 122 (L) 01/02/2017   Lab Results  Component Value Date   INR 1.26 12/24/2016   Lab Results  Component Value Date   NA 142  01/02/2017   K 3.8 01/02/2017   CL 109 01/02/2017   CO2 27 01/02/2017   BUN 22 (H) 01/02/2017   CREATININE 1.07 01/02/2017   GLUCOSE 93 01/02/2017    Discharge Medications:   Allergies as of 01/02/2017      Reactions   Morphine And Related Other (See Comments)   Severe confusion   Tramadol Anxiety   Severe confusion      Medication List    STOP taking these medications   aspirin EC 81 MG tablet   naproxen sodium 220 MG tablet Commonly known as:  ANAPROX     TAKE these medications   amLODipine 5 MG tablet Commonly known as:  NORVASC Take 5 mg by mouth daily. In am.   Biotin 5000 MCG Tabs Take 5,000 mcg by mouth daily.   busPIRone 10 MG tablet Commonly known as:  BUSPAR Take 10 mg by mouth 2 (two) times daily.   CALCIUM 500 + D3 250-500 MG-UNIT Chew Generic drug:  Ca Phosphate-Cholecalciferol Chew 2 tablets by mouth daily.   donepezil 10 MG tablet Commonly known as:  ARICEPT Take 10 mg by mouth at bedtime.   enoxaparin 30 MG/0.3ML injection Commonly known as:  LOVENOX Inject 0.3  mLs (30 mg total) into the skin every 12 (twelve) hours.   finasteride 5 MG tablet Commonly known as:  PROSCAR Take 5 mg by mouth at bedtime.   Flax Seed Oil 1000 MG Caps Take 1,000 mg by mouth daily.   memantine 10 MG tablet Commonly known as:  NAMENDA Take 10 mg by mouth 2 (two) times daily.   MULTI-VITAMINS Tabs Take 1 tablet by mouth daily.   oxyCODONE 5 MG immediate release tablet Commonly known as:  Oxy IR/ROXICODONE Take 1-2 tablets (5-10 mg total) by mouth every 4 (four) hours as needed for severe pain or breakthrough pain.   Potassium 99 MG Tabs Take 99 mg by mouth daily as needed (for muscle cramps).   tamsulosin 0.4 MG Caps capsule Commonly known as:  FLOMAX TAKE ONE CAPSULE BY MOUTH ONCE DAILY What changed:  See the new instructions.   Vitamin B-12 5000 MCG Subl Place 5,000 mcg under the tongue daily.   vitamin C 1000 MG tablet Take by mouth.    Vitamin C Chew Chew 2 tablets by mouth daily. IMMUNE HEALTH            Durable Medical Equipment        Start     Ordered   12/31/16 1503  For home use only DME continuous positive airway pressure (CPAP)  Once    Question Answer Comment  Patient has OSA or probable OSA Yes   Is the patient currently using CPAP in the home Yes   If yes (to question two) Determine DME provider and inform them of any new orders/settings   Settings 5-10   Signs and symptoms of probable OSA  (select all that apply) Witnessed apneas   Signs and symptoms of probable OSA  (select all that apply) Snoring   CPAP supplies needed Mask, headgear, cushions, filters, heated tubing and water chamber      12/31/16 1505   12/31/16 1226  DME Walker rolling  Once    Question:  Patient needs a walker to treat with the following condition  Answer:  Total knee replacement status   12/31/16 1225   12/31/16 1226  DME Bedside commode  Once    Question:  Patient needs a bedside commode to treat with the following condition  Answer:  Total knee replacement status   12/31/16 1225       Discharge Care Instructions        Start     Ordered   01/01/17 0000  enoxaparin (LOVENOX) 30 MG/0.3ML injection  Every 12 hours     01/01/17 5277   01/01/17 0000  oxyCODONE (OXY IR/ROXICODONE) 5 MG immediate release tablet  Every 4 hours PRN     01/01/17 0642   01/01/17 0000  Increase activity slowly     01/01/17 8242   01/01/17 0000  Diet - low sodium heart healthy     01/01/17 3536      Diagnostic Studies: Dg Knee Left Port  Result Date: 12/31/2016 CLINICAL DATA:  Status post left total knee joint prosthesis placement. EXAM: PORTABLE LEFT KNEE - 1-2 VIEW COMPARISON:  None in PACs FINDINGS: The patient has undergone total knee joint prosthesis placement. Radiographic positioning of the prosthetic components is good. The interface with the native bone appears normal. Surgical drain lines are present as are skin staples.  IMPRESSION: There is no immediate postprocedure complication following left total knee joint prosthesis placement. Electronically Signed   By: David  Martinique M.D.   On: 12/31/2016  11:20    Disposition: 03-Skilled Nursing Facility  Discharge Instructions    Diet - low sodium heart healthy    Complete by:  As directed    Increase activity slowly    Complete by:  As directed        Contact information for follow-up providers    Watt Climes, PA On 01/15/2017.   Specialty:  Physician Assistant Why:  at 3:30pm Contact information: [place] [address] [phone]        Dereck Leep, MD On 02/12/2017.   Specialty:  Orthopedic Surgery Why:  at 10:45am Contact information: [address] [facility] [place] [address] [phone]            Contact information for after-discharge care    Destination    [facility] .   Specialty:  [place] information: [address] [place] [phone]                   Signed: Prescott Parma, Dequante Tremaine 01/02/2017, 7:07 AM

## 2017-01-01 NOTE — Progress Notes (Signed)
Post op day 1. Pt. Confused at times but easily redirected. Pain controlled with meds per MAR. Neurochecks WDL. Bone foam in place. Polar care on and running. Pt. Was dangled yesterday. IS at the bedside and pt. Reminded to use it with every round. Resting quietly. Will continue to monitor.

## 2017-01-01 NOTE — Clinical Social Work Placement (Signed)
   CLINICAL SOCIAL WORK PLACEMENT  NOTE  Date:  01/01/2017  Patient Details  Name: [name] MRN: [id number] Date of Birth: 11-Oct-1934  Clinical Social Work is seeking post-discharge placement for this patient at the [place] level of care (*CSW will initial, date and re-position this form in  chart as items are completed):  Yes   Patient/family provided with [place] Work Department's list of facilities offering this level of care within the geographic area requested by the patient (or if unable, by the patient's family).  Yes   Patient/family informed of their freedom to choose among providers that offer the needed level of care, that participate in Medicare, Medicaid or managed care program needed by the patient, have an available bed and are willing to accept the patient.  Yes   Patient/family informed of [place]'s ownership interest in [facility] and [facility], as well as of the fact that they are under no obligation to receive care at these facilities.  PASRR submitted to EDS on       PASRR number received on       Existing PASRR number confirmed on 12/31/16     FL2 transmitted to all facilities in geographic area requested by pt/family on 12/31/16     FL2 transmitted to all facilities within larger geographic area on       Patient informed that his/her managed care company has contracts with or will negotiate with certain facilities, including the following:        Yes   Patient/family informed of bed offers received.  Patient chooses bed at  ([facility]     Physician recommends and patient chooses bed at      Patient to be transferred to   on  .  Patient to be transferred to facility by       Patient family notified on   of transfer.  Name of family member notified:        PHYSICIAN       Additional Comment:    _______________________________________________ Nivaan Dicenzo, Veronia Beets, LCSW 01/01/2017, 11:23 AM

## 2017-01-01 NOTE — Clinical Social Work Note (Addendum)
Clinical Social Work Assessment  Patient Details  Name: [name] MRN: [id number] Date of Birth: 1934-05-17  Date of referral:  01/01/17               Reason for consult:  Facility Placement                Permission sought to share information with:  Chartered certified accountant granted to share information::  Yes, Verbal Permission Granted  Name::      Mignon::   [place]   Relationship::     Contact Information:     Housing/Transportation Living arrangements for the past 2 months:  [place] of Information:  Patient, Spouse Patient Interpreter Needed:  None Criminal Activity/Legal Involvement Pertinent to Current Situation/Hospitalization:  No - Comment as needed Significant Relationships:  Spouse Lives with:  Spouse Do you feel safe going back to the place where you live?  Yes Need for family participation in patient care:  Yes (Comment)  Care giving concerns:  Patient lives in [place] with his wife [name].    Social Worker assessment / plan:  Holiday representative (CSW) received SNF consult. PT is recommending SNF. CSW met with patient alone at bedside. Per patient he lives in [place] with his wife [name] and she makes the decisions for him. CSW contacted patient's wife [name] with patient's permission. Per [name] patient has dementia and she is the primary caregiver. CSW explained to wife that PT is recommending SNF and that Health Team will have to approve SNF. Wife verbalized her understanding and is agreeable to SNF search in [place]. FL2 complete and faxed out.   CSW presented bed offers to patient's wife [name] and she chose [facility]. Patient is agreeable to going to [facility]. Per wife patient[facility]ap machine [facility] at [facility] and can go with him to [facility]. Health Team case manager is aware of above. [facility] liaison is aware of above. CSW will continue to follow and assist as needed.    Employment status:  Disabled  (Comment on whether or not currently receiving Disability) Insurance information:  Managed Medicare PT Recommendations:  [place] / Referral to community resources:  [place]  Patient/Family's Response to care:  Patient and is wife [name] are agreeable for patient to go to [facility].   Patient/Family's Understanding of and Emotional Response to Diagnosis, Current Treatment, and Prognosis:  Patient and his wife [name] were very pleasant and thanked CSW for assistance.   Emotional Assessment Appearance:  Appears stated age Attitude/Demeanor/Rapport:    Affect (typically observed):  Pleasant, Accepting, Adaptable Orientation:  Oriented to Self, Oriented to Place, Fluctuating Orientation (Suspected and/or reported Sundowners) Alcohol / Substance use:  Not Applicable Psych involvement (Current and /or in the community):  No (Comment)  Discharge Needs  Concerns to be addressed:  Discharge Planning Concerns Readmission within the last 30 days:  No Current discharge risk:  Dependent with Mobility, Cognitively Impaired Barriers to Discharge:  Continued Medical Work up   [employer], [employer], LCSW 01/01/2017, 11:25 AM

## 2017-01-01 NOTE — Progress Notes (Signed)
Patient's wife approached stating that we have patient down as being allergic to soy oil and that is incorrect. Wife requested that it be removed.

## 2017-01-01 NOTE — Progress Notes (Signed)
Physical Therapy Treatment Patient Details Name: [name] MRN: [id number] DOB: 1934-07-13 Today's Date: 01/01/2017    History of Present Illness Pt is an 82 yo male who was admitted to [facility] for a Left TKA 12/31/16.  PMHx includes: sleep apnea, OA, falls, a-fib with RVR, dementia, rib fractures, rotator cuff tear, anemia, CAD    PT Comments    Pt able to progress to ambulating 50 feet with RW CGA; pt requiring consistent vc's to stay closer to RW and take longer step lengths (pt with very short B steps and pushing walker too far forward).  Pt demonstrating confusion (oriented to self only) and also intermittently impulsive and also difficulty sequencing during session.  Safety concerns d/t cognition noted during session.  Will continue to progress pt with strengthening, balance, and progressive functional mobility per pt tolerance.    Follow Up Recommendations  SNF     Equipment Recommendations  Rolling walker with 5" wheels    Recommendations for Other Services       Precautions / Restrictions Precautions Precautions: Fall Precaution Comments: Monitor O2 and HR Required Braces or Orthoses: Knee Immobilizer - Left Knee Immobilizer - Left: Discontinue once straight leg raise with < 10 degree lag Restrictions Weight Bearing Restrictions: Yes LLE Weight Bearing: Weight bearing as tolerated    Mobility  Bed Mobility Overal bed mobility: Needs Assistance Bed Mobility: Supine to Sit     Supine to sit: Supervision;HOB elevated     General bed mobility comments: increased effort and time to perform; vc's for technique and to use bed rail; difficulty with sequencing  Transfers Overall transfer level: Needs assistance Equipment used: Rolling walker (2 wheeled) Transfers: Sit to/from Stand Sit to Stand: Min assist         General transfer comment: Increased effort and time to perform; vc's for UE and LE placement required  Ambulation/Gait Ambulation/Gait assistance:  Min guard;+2 safety/equipment (2nd assist for IV pole management) Ambulation Distance (Feet): 50 Feet Assistive device: Rolling walker (2 wheeled)   Gait velocity: decreased   General Gait Details: decreased B step length; decreased stance time L LE; consistent vc's required to stay closer to RW and for upright posture   Stairs            Wheelchair Mobility    Modified Rankin (Stroke Patients Only)       Balance Overall balance assessment: Needs assistance Sitting-balance support: No upper extremity supported;Feet supported Sitting balance-Leahy Scale: Good Sitting balance - Comments: sitting reaching within BOS   Standing balance support: No upper extremity supported Standing balance-Leahy Scale: Fair Standing balance comment: static standing while urinating                            Cognition Arousal/Alertness: Awake/alert Behavior During Therapy: Flat affect Overall Cognitive Status: No family/caregiver present to determine baseline cognitive functioning (Oriented to person only)                                        Exercises Total Joint Exercises Ankle Circles/Pumps: AROM;Strengthening;Both;10 reps;Supine Quad Sets: AROM;Strengthening;Both;10 reps;Supine Short Arc Quad: AROM;Strengthening;Left;10 reps;Supine Heel Slides: AAROM;Strengthening;Left;10 reps;Supine Hip ABduction/ADduction: AROM;Strengthening;Left;10 reps;Supine Straight Leg Raises: AROM;Strengthening;Left;10 reps;Supine Goniometric ROM: L knee extension 8 degrees short of neutral semi-supine in bed; L knee flexion 85 degrees in sitting  Vc's for above exercise(s) technique required.  General Comments General comments (skin integrity, edema, etc.): Surgical bandage, polar care, and hemovac in place.  Nursing cleared pt for participation in physical therapy.  Pt agreeable to PT session.  Pt requesting to urinate end of session and upon pt removing brief (in standing)  pt started to urinate on floor (before able to place urinal).  Nursing tech called and pt and floor was cleaned up.       Pertinent Vitals/Pain Pain Assessment: 0-10 Pain Score: 4  Pain Location: Left knee Pain Descriptors / Indicators: Sore Pain Intervention(s): Limited activity within patient's tolerance;Monitored during session;Premedicated before session;Repositioned (Polar care applied)  HR 60 bpm (taken manually) and O2 99% on room air beginning of session.    Home Living Family/patient expects to be discharged to:: Skilled nursing facility Living Arrangements: Spouse/significant other                  Prior Function Level of Independence: Needs assistance    ADL's / Homemaking Assistance Needed: Wife assists with ADLs, morning care, and IADLs.      PT Goals (current goals can now be found in the care plan section) Acute Rehab PT Goals Patient Stated Goal: to improve mobility PT Goal Formulation: With patient Time For Goal Achievement: 01/14/17 Potential to Achieve Goals: Good Progress towards PT goals: Progressing toward goals    Frequency    BID      PT Plan Frequency needs to be updated (Per our PT protocol, will update frequency to BID)    Co-evaluation              AM-PAC PT "6 Clicks" Daily Activity  Outcome Measure  Difficulty turning over in bed (including adjusting bedclothes, sheets and blankets)?: A Little Difficulty moving from lying on back to sitting on the side of the bed? : A Lot Difficulty sitting down on and standing up from a chair with arms (e.g., wheelchair, bedside commode, etc,.)?: A Little Help needed moving to and from a bed to chair (including a wheelchair)?: A Little Help needed walking in hospital room?: A Little Help needed climbing 3-5 steps with a railing? : A Little 6 Click Score: 17    End of Session Equipment Utilized During Treatment: Gait belt Activity Tolerance: Patient tolerated treatment well Patient left:  in chair;with call bell/phone within reach;with chair alarm set;with SCD's reapplied (B heels elevated via towel rolls; polar care in place and activated) Nurse Communication: Mobility status;Precautions PT Visit Diagnosis: Other abnormalities of gait and mobility (R26.89);Muscle weakness (generalized) (M62.81);Difficulty in walking, not elsewhere classified (R26.2);Pain Pain - Right/Left: Left Pain - part of body: Knee     Time: 1002-1050 PT Time Calculation (min) (ACUTE ONLY): 48 min  Charges:  $Gait Training: 8-22 mins $Therapeutic Exercise: 8-22 mins $Therapeutic Activity: 8-22 mins                    G CodesLeitha Bleak, PT 01/01/17, 11:23 AM [phone]

## 2017-01-01 NOTE — Care Management Important Message (Signed)
Important Message  Patient Details  Name: [name] MRN: [id number] Date of Birth: [birth year]   Medicare Important Message Given:  N/A - LOS <3 / Initial given by admissions    Jolly Mango, RN 01/01/2017, 9:48 AM

## 2017-01-02 DIAGNOSIS — M6281 Muscle weakness (generalized): Secondary | ICD-10-CM | POA: Diagnosis not present

## 2017-01-02 DIAGNOSIS — F0390 Unspecified dementia without behavioral disturbance: Secondary | ICD-10-CM | POA: Diagnosis not present

## 2017-01-02 DIAGNOSIS — N403 Nodular prostate with lower urinary tract symptoms: Secondary | ICD-10-CM | POA: Diagnosis not present

## 2017-01-02 DIAGNOSIS — I4891 Unspecified atrial fibrillation: Secondary | ICD-10-CM | POA: Diagnosis not present

## 2017-01-02 DIAGNOSIS — R262 Difficulty in walking, not elsewhere classified: Secondary | ICD-10-CM | POA: Diagnosis not present

## 2017-01-02 DIAGNOSIS — R1312 Dysphagia, oropharyngeal phase: Secondary | ICD-10-CM | POA: Diagnosis not present

## 2017-01-02 DIAGNOSIS — D291 Benign neoplasm of prostate: Secondary | ICD-10-CM | POA: Diagnosis not present

## 2017-01-02 DIAGNOSIS — F3289 Other specified depressive episodes: Secondary | ICD-10-CM | POA: Diagnosis not present

## 2017-01-02 DIAGNOSIS — F0391 Unspecified dementia with behavioral disturbance: Secondary | ICD-10-CM | POA: Diagnosis not present

## 2017-01-02 DIAGNOSIS — R41841 Cognitive communication deficit: Secondary | ICD-10-CM | POA: Diagnosis not present

## 2017-01-02 DIAGNOSIS — Z96652 Presence of left artificial knee joint: Secondary | ICD-10-CM | POA: Diagnosis not present

## 2017-01-02 DIAGNOSIS — I10 Essential (primary) hypertension: Secondary | ICD-10-CM | POA: Diagnosis not present

## 2017-01-02 DIAGNOSIS — D649 Anemia, unspecified: Secondary | ICD-10-CM | POA: Diagnosis not present

## 2017-01-02 DIAGNOSIS — M1990 Unspecified osteoarthritis, unspecified site: Secondary | ICD-10-CM | POA: Diagnosis not present

## 2017-01-02 LAB — CBC
HCT: 32.7 % — ABNORMAL LOW (ref 40.0–52.0)
HEMOGLOBIN: 11.4 g/dL — AB (ref 13.0–18.0)
MCH: 32.8 pg (ref 26.0–34.0)
MCHC: 34.9 g/dL (ref 32.0–36.0)
MCV: 93.8 fL (ref 80.0–100.0)
Platelets: 122 10*3/uL — ABNORMAL LOW (ref 150–440)
RBC: 3.49 MIL/uL — AB (ref 4.40–5.90)
RDW: 13.3 % (ref 11.5–14.5)
WBC: 7.8 10*3/uL (ref 3.8–10.6)

## 2017-01-02 LAB — BASIC METABOLIC PANEL
Anion gap: 6 (ref 5–15)
BUN: 22 mg/dL — AB (ref 6–20)
CO2: 27 mmol/L (ref 22–32)
CREATININE: 1.07 mg/dL (ref 0.61–1.24)
Calcium: 8.1 mg/dL — ABNORMAL LOW (ref 8.9–10.3)
Chloride: 109 mmol/L (ref 101–111)
GFR calc Af Amer: >60 mL/min (ref >60)
GFR calc non Af Amer: >60 mL/min (ref >60)
GLUCOSE: 93 mg/dL (ref 65–99)
POTASSIUM: 3.8 mmol/L (ref 3.5–5.1)
Sodium: 142 mmol/L (ref 135–145)

## 2017-01-02 NOTE — Progress Notes (Signed)
Post op day 2. Pt. Confused at times but easily redirected. Pain controlled with meds per MAR. Polar care on and running. Foot pumps on. Wife at bedside. Resting quietly. Will continue to monitor

## 2017-01-02 NOTE — Progress Notes (Signed)
Patient is medically stable for D/C to [facility] today. Per [name] liaison patient can come today to room 807. Health Team SNF authorization has been received. RN will call report to RN Yaakov Guthrie at [phone] and arrange EMS for transport. Clinical Education officer, museum (CSW) sent D/C orders to [facility] via [facility]. Patient's wife [name] is at bedside and aware of above. Please reconsult if future social work needs arise. CSW signing off.   [employer], LCSW [phone]

## 2017-01-02 NOTE — Progress Notes (Signed)
Pt. Picked up by EMS. Transported to [facility]

## 2017-01-02 NOTE — Progress Notes (Signed)
Physical Therapy Treatment Patient Details Name: [name] MRN: [id number] DOB: 1935-03-07 Today's Date: 01/02/2017    History of Present Illness Pt is an 82 yo male who was admitted to [facility] for a Left TKA 12/31/16.  PMHx includes: sleep apnea, OA, falls, a-fib with RVR, dementia, rib fractures, rotator cuff tear, anemia, CAD    PT Comments    Pt demonstrating difficulty with sequencing with bed mobility and transfers requiring extra time and assist.  Also demonstrates difficulty with maintaining upright and staying close to RW for safety during ambulation (and to stay within RW during turns) requiring assist for safety.  0/10 L knee pain at rest and 4/10 L knee pain end of session (pt declined pain meds; nursing notified).  Will continue to progress pt with strengthening, knee ROM, and increasing independence with functional mobility during hospital stay.    Follow Up Recommendations  SNF     Equipment Recommendations  Rolling walker with 5" wheels    Recommendations for Other Services       Precautions / Restrictions Precautions Precautions: Fall Precaution Comments: Monitor O2 and HR Required Braces or Orthoses: Knee Immobilizer - Left Knee Immobilizer - Left: Discontinue once straight leg raise with < 10 degree lag Restrictions Weight Bearing Restrictions: Yes LLE Weight Bearing: Weight bearing as tolerated    Mobility  Bed Mobility Overal bed mobility: Needs Assistance Bed Mobility: Supine to Sit     Supine to sit: Min assist;HOB elevated     General bed mobility comments: assist for trunk; increased effort and time to perform; use of bed rail; increased time for sequencing  Transfers Overall transfer level: Needs assistance Equipment used: Rolling walker (2 wheeled) Transfers: Sit to/from Stand Sit to Stand: Mod assist         General transfer comment: pt unable to stand on his own and requiring vc's for technique/positioning; requiring mod assist to  initiate stand up to RW and min assist to control descent sitting on chair; vc's for UE and LE placement required  Ambulation/Gait Ambulation/Gait assistance: Min guard;Min assist Ambulation Distance (Feet): 120 Feet Assistive device: Rolling walker (2 wheeled)   Gait velocity: decreased   General Gait Details: decreased B step length; decreased stance time L LE; consistent vc's required to stay closer to RW and for upright posture; CGA to min assist required to keep RW closer to pt   Stairs            Wheelchair Mobility    Modified Rankin (Stroke Patients Only)       Balance Overall balance assessment: Needs assistance Sitting-balance support: No upper extremity supported;Feet supported Sitting balance-Leahy Scale: Good Sitting balance - Comments: sitting reaching within BOS   Standing balance support: No upper extremity supported Standing balance-Leahy Scale: Fair Standing balance comment: static standing                            Cognition Arousal/Alertness: Awake/alert Behavior During Therapy: Flat affect Overall Cognitive Status:  (Oriented to self)                                        Exercises Total Joint Exercises Ankle Circles/Pumps: AROM;Strengthening;Both;10 reps;Supine Quad Sets: AROM;Strengthening;Both;10 reps;Supine Short Arc Quad: AROM;Strengthening;Left;10 reps;Supine Heel Slides: AAROM;Strengthening;Left;10 reps;Supine Hip ABduction/ADduction: AROM;Strengthening;Left;10 reps;Supine Straight Leg Raises: AROM;Strengthening;Left;10 reps;Supine Goniometric ROM: L knee extension 5 degrees  short of neutral semi-supine in bed; L knee flexion 92 degrees in sitting  Vc's required for above exercise(s) technique.    General Comments General comments (skin integrity, edema, etc.): dressing and polar care in place.  Nursing cleared pt for participation in physical therapy.  Pt agreeable to PT session.      Pertinent  Vitals/Pain Pain Assessment: 0-10 Pain Score: 4  Pain Location: Left knee Pain Descriptors / Indicators: Sore Pain Intervention(s): Limited activity within patient's tolerance;Monitored during session;Repositioned (Nursing tech to apply polar care after cleaning pt up end of session (d/t incontinence in briefs))  Vitals (HR and O2 on room air) stable and WFL throughout treatment session.    Home Living                      Prior Function            PT Goals (current goals can now be found in the care plan section) Acute Rehab PT Goals Patient Stated Goal: to improve mobility PT Goal Formulation: With patient Time For Goal Achievement: 01/14/17 Potential to Achieve Goals: Good Progress towards PT goals: Progressing toward goals    Frequency    BID      PT Plan Current plan remains appropriate    Co-evaluation              AM-PAC PT "6 Clicks" Daily Activity  Outcome Measure  Difficulty turning over in bed (including adjusting bedclothes, sheets and blankets)?: A Little Difficulty moving from lying on back to sitting on the side of the bed? : Unable Difficulty sitting down on and standing up from a chair with arms (e.g., wheelchair, bedside commode, etc,.)?: Unable Help needed moving to and from a bed to chair (including a wheelchair)?: A Lot Help needed walking in hospital room?: A Little Help needed climbing 3-5 steps with a railing? : A Lot 6 Click Score: 12    End of Session Equipment Utilized During Treatment: Gait belt Activity Tolerance: Patient tolerated treatment well Patient left: in chair;with nursing/sitter in room (Nursing tech present (pt sitting in chair) to assist pt with clean-up end of session (pt with urinary incontinence in briefs); nursing tech reported she would set pt up when she was finished (with chair alarm, polar care, SCD's, towel rolls)) Nurse Communication: Mobility status;Precautions PT Visit Diagnosis: Other abnormalities  of gait and mobility (R26.89);Muscle weakness (generalized) (M62.81);Difficulty in walking, not elsewhere classified (R26.2);Pain Pain - Right/Left: Left Pain - part of body: Knee     Time: 7209-4709 PT Time Calculation (min) (ACUTE ONLY): 23 min  Charges:  $Therapeutic Exercise: 8-22 mins $Therapeutic Activity: 8-22 mins                    G CodesLeitha Bleak, PT 01/02/17, 9:35 AM [phone]

## 2017-01-02 NOTE — Progress Notes (Signed)
ORTHOPAEDICS PROGRESS NOTE  PATIENT NAME: [name] DOB: Sep 07, 1934  MRN: [id number]  POD # 2: Left total knee arthroplasty  Subjective: Patient resting well last night. He states the pain has been well-controlled. Some mild confusion noted by nursing and physical therapy, but patient is apparently at baseline.  Objective: Vital signs in last 24 hours: Temp:  [97.6 F (36.4 C)-98.5 F (36.9 C)] 98.5 F (36.9 C) (08/30 1929) Pulse Rate:  [51-67] 56 (08/30 1929) Resp:  [16-19] 19 (08/30 1929) BP: (118-143)/(52-80) 119/52 (08/30 1929) SpO2:  [96 %-100 %] 100 % (08/30 1929)  Intake/Output from previous day: 08/30 0701 - 08/31 0700 In: 1963.3 [P.O.:480; I.V.:1283.3] Out: 300 [Urine:200; Drains:100]   Recent Labs  01/01/17 0337 01/02/17 0409  WBC 13.2* 7.8  HGB 11.9* 11.4*  HCT 34.0* 32.7*  PLT 134* 122*  K 3.9 3.8  CL 108 109  CO2 25 27  BUN 21* 22*  CREATININE 1.34* 1.07  GLUCOSE 146* 93  CALCIUM 7.8* 8.1*    EXAM General: Well-developed well-nourished male seen in no apparent discomfort. Left lower extremity: Bone foam was in place. The patient is able to perform independent straight leg raise. Good quadriceps tone. Homans test is negative. Hemovac drain was discontinued today. Dressing was changed. Surgical incision was well approximated. No significant erythema or ecchymosis. Neurologic: Awake and alert. Sensory and motor function are grossly intact.  Assessment: Left total knee arthroplasty  Secondary diagnoses: Sleep apnea Hypertension Hyperlipidemia Depression Dementia Carotid artery disease Atrial fibrillation  Plan: Notes from physical therapy were reviewed. The patient will be discharged to [facility] today for continuation of his total knee rehabilitation. Physical therapy and occupational therapy are to be continued. The patient may be weightbearing as tolerated to the left lower extremity. Continuation of the Bone Foam to work on  extension/hamstring stretching. DVT Prophylaxis - Lovenox, Foot Pumps and TED hose. Lovenox to be continued for 2 weeks with aspirin to resume after completion of the Lovenox.  Infinity Jeffords P. Holley Bouche M.D.

## 2017-01-02 NOTE — Progress Notes (Signed)
Report called to Yaakov Guthrie , EMS called for transportation.

## 2017-01-05 ENCOUNTER — Other Ambulatory Visit: Payer: Self-pay | Admitting: Urology

## 2017-01-05 DIAGNOSIS — N400 Enlarged prostate without lower urinary tract symptoms: Secondary | ICD-10-CM

## 2017-01-06 DIAGNOSIS — R262 Difficulty in walking, not elsewhere classified: Secondary | ICD-10-CM | POA: Diagnosis not present

## 2017-01-06 DIAGNOSIS — Z96652 Presence of left artificial knee joint: Secondary | ICD-10-CM | POA: Diagnosis not present

## 2017-01-06 DIAGNOSIS — F0390 Unspecified dementia without behavioral disturbance: Secondary | ICD-10-CM | POA: Diagnosis not present

## 2017-01-06 DIAGNOSIS — N403 Nodular prostate with lower urinary tract symptoms: Secondary | ICD-10-CM | POA: Diagnosis not present

## 2017-01-06 DIAGNOSIS — I10 Essential (primary) hypertension: Secondary | ICD-10-CM | POA: Diagnosis not present

## 2017-01-12 DIAGNOSIS — N403 Nodular prostate with lower urinary tract symptoms: Secondary | ICD-10-CM | POA: Diagnosis not present

## 2017-01-12 DIAGNOSIS — D649 Anemia, unspecified: Secondary | ICD-10-CM | POA: Diagnosis not present

## 2017-01-12 DIAGNOSIS — I10 Essential (primary) hypertension: Secondary | ICD-10-CM | POA: Diagnosis not present

## 2017-01-12 DIAGNOSIS — Z96652 Presence of left artificial knee joint: Secondary | ICD-10-CM | POA: Diagnosis not present

## 2017-01-12 DIAGNOSIS — R262 Difficulty in walking, not elsewhere classified: Secondary | ICD-10-CM | POA: Diagnosis not present

## 2017-01-12 DIAGNOSIS — F0390 Unspecified dementia without behavioral disturbance: Secondary | ICD-10-CM | POA: Diagnosis not present

## 2017-01-22 ENCOUNTER — Other Ambulatory Visit: Payer: Self-pay | Admitting: *Deleted

## 2017-01-22 DIAGNOSIS — Z9181 History of falling: Secondary | ICD-10-CM | POA: Diagnosis not present

## 2017-01-22 DIAGNOSIS — F419 Anxiety disorder, unspecified: Secondary | ICD-10-CM | POA: Diagnosis not present

## 2017-01-22 DIAGNOSIS — I4891 Unspecified atrial fibrillation: Secondary | ICD-10-CM | POA: Diagnosis not present

## 2017-01-22 DIAGNOSIS — F329 Major depressive disorder, single episode, unspecified: Secondary | ICD-10-CM | POA: Diagnosis not present

## 2017-01-22 DIAGNOSIS — Z96652 Presence of left artificial knee joint: Secondary | ICD-10-CM | POA: Diagnosis not present

## 2017-01-22 DIAGNOSIS — K5900 Constipation, unspecified: Secondary | ICD-10-CM | POA: Diagnosis not present

## 2017-01-22 DIAGNOSIS — M06062 Rheumatoid arthritis without rheumatoid factor, left knee: Secondary | ICD-10-CM | POA: Diagnosis not present

## 2017-01-22 DIAGNOSIS — Z471 Aftercare following joint replacement surgery: Secondary | ICD-10-CM | POA: Diagnosis not present

## 2017-01-22 DIAGNOSIS — I10 Essential (primary) hypertension: Secondary | ICD-10-CM | POA: Diagnosis not present

## 2017-01-22 DIAGNOSIS — F0391 Unspecified dementia with behavioral disturbance: Secondary | ICD-10-CM | POA: Diagnosis not present

## 2017-01-23 ENCOUNTER — Other Ambulatory Visit: Payer: Self-pay | Admitting: *Deleted

## 2017-01-23 DIAGNOSIS — I4891 Unspecified atrial fibrillation: Secondary | ICD-10-CM | POA: Diagnosis not present

## 2017-01-23 DIAGNOSIS — F0391 Unspecified dementia with behavioral disturbance: Secondary | ICD-10-CM | POA: Diagnosis not present

## 2017-01-23 DIAGNOSIS — M06062 Rheumatoid arthritis without rheumatoid factor, left knee: Secondary | ICD-10-CM | POA: Diagnosis not present

## 2017-01-23 DIAGNOSIS — F419 Anxiety disorder, unspecified: Secondary | ICD-10-CM | POA: Diagnosis not present

## 2017-01-23 DIAGNOSIS — Z471 Aftercare following joint replacement surgery: Secondary | ICD-10-CM | POA: Diagnosis not present

## 2017-01-23 DIAGNOSIS — Z96652 Presence of left artificial knee joint: Secondary | ICD-10-CM | POA: Diagnosis not present

## 2017-01-23 DIAGNOSIS — Z9181 History of falling: Secondary | ICD-10-CM | POA: Diagnosis not present

## 2017-01-23 DIAGNOSIS — I10 Essential (primary) hypertension: Secondary | ICD-10-CM | POA: Diagnosis not present

## 2017-01-23 DIAGNOSIS — F329 Major depressive disorder, single episode, unspecified: Secondary | ICD-10-CM | POA: Diagnosis not present

## 2017-01-23 DIAGNOSIS — K5900 Constipation, unspecified: Secondary | ICD-10-CM | POA: Diagnosis not present

## 2017-01-23 NOTE — Patient Outreach (Addendum)
[place] [facility]) Care Management  01/22/17   [name] 12-19-1934 [id number]  Transition of Care Referral  Referral Date: 01/22/17 Referral Source: Health Plan Date of Discharge: 01/19/17 Facility: [facility] Discharge Diagnosis Aftercare following joint replacement surgery: Insurance: HTA  Outreach attempt to patient and HIPAA verified with spouse [name]). Spouse stated, patient has a history of dementia. He is unable to communicate on the phone. [name] verbalized being the main caregiver for her husband. She stated, patient is having difficulty with mobility and he is afraid of falling again. [name] has to assist patient with all ADL's. [name] stated, patient is appreciative of her assistance,, however he is working against her, involuntarily.[name] explained how caring for patient is beginning to affect her physically. Patient is incontinent of bladder and bowel. [name] reported, patient uses the bathroom frequently. Patient continues to experience weakness, since being discharged from rehab. [facility] was arranged by [facility] and initial visit is scheduled for 01/22/17. [name] is requesting assistance with patient's medical care. [facility] services and benefits explained to [place]. [name] agreed to services. Patient has a primary MD appointments scheduled for 01/26/17 and 03/23/17.:   Plan: RN CM will sent referral to [facility] RN for possible assistance with chronic medical management. RN CM will send referral to [facility] for possible assistance with community resources. RN CM advised patient to contact RNCM for any needs or concerns. RN CM advised patient to alert MD for any changes in conditions.   Lake Bells, RN, [name], MHA/MSL, [place] Telephonic Care Manager Coordinator [facility] Direct Phone: [phone] Toll Free: [phone] Fax: [phone]

## 2017-01-23 NOTE — Patient Outreach (Signed)
[place] [facility]) Care Management  01/23/2017  MAJESTY OEHLERT 18-Nov-1934 [id number]   Transition of Care Referral  Referral Date: 01/22/17 Referral Source: Health Plan Date of Discharge: 01/19/17 Facility: [facility] Discharge Diagnosis Aftercare following joint replacement surgery: Insurance: HTA  Incoming call from [place], spouse. [name] reported, [place] made a visit on 01/22/17. Per spouse, patient would be responsible for $25.00 for each discipline provided by [place]. [name] stated, she declined the Skilled Nurse and OT services. She stated, they can't afford to pay the co-pay. Linda plans to check on outpatient therapy for the patient. She believes outpatient therapy will be "cheaper" than paying the co-pay for home PT. [name] called to verify [facility] services will be provided at no cost to patient. RN CM [place]) informed patient that [facility] services will provided at no cost the patient. [name] agreed to [facility] services.    [place]ake Bells, RN, [name], MHA/MSL, [place] Telephonic Care Manager Coordinator [facility] Direct Phone: [phone] Toll Free: [phone] Fax: [phone]

## 2017-01-26 DIAGNOSIS — I4891 Unspecified atrial fibrillation: Secondary | ICD-10-CM | POA: Diagnosis not present

## 2017-01-26 DIAGNOSIS — Z96652 Presence of left artificial knee joint: Secondary | ICD-10-CM | POA: Diagnosis not present

## 2017-01-26 DIAGNOSIS — K5900 Constipation, unspecified: Secondary | ICD-10-CM | POA: Diagnosis not present

## 2017-01-26 DIAGNOSIS — Z471 Aftercare following joint replacement surgery: Secondary | ICD-10-CM | POA: Diagnosis not present

## 2017-01-26 DIAGNOSIS — Z9181 History of falling: Secondary | ICD-10-CM | POA: Diagnosis not present

## 2017-01-26 DIAGNOSIS — F0391 Unspecified dementia with behavioral disturbance: Secondary | ICD-10-CM | POA: Diagnosis not present

## 2017-01-26 DIAGNOSIS — I10 Essential (primary) hypertension: Secondary | ICD-10-CM | POA: Diagnosis not present

## 2017-01-26 DIAGNOSIS — M06062 Rheumatoid arthritis without rheumatoid factor, left knee: Secondary | ICD-10-CM | POA: Diagnosis not present

## 2017-01-26 DIAGNOSIS — F329 Major depressive disorder, single episode, unspecified: Secondary | ICD-10-CM | POA: Diagnosis not present

## 2017-01-26 DIAGNOSIS — F419 Anxiety disorder, unspecified: Secondary | ICD-10-CM | POA: Diagnosis not present

## 2017-01-28 ENCOUNTER — Other Ambulatory Visit: Payer: Self-pay | Admitting: *Deleted

## 2017-01-28 DIAGNOSIS — M6281 Muscle weakness (generalized): Secondary | ICD-10-CM | POA: Diagnosis not present

## 2017-01-28 DIAGNOSIS — M06062 Rheumatoid arthritis without rheumatoid factor, left knee: Secondary | ICD-10-CM | POA: Diagnosis not present

## 2017-01-28 DIAGNOSIS — M1990 Unspecified osteoarthritis, unspecified site: Secondary | ICD-10-CM | POA: Diagnosis not present

## 2017-01-28 NOTE — Patient Outreach (Signed)
Unsuccessful telephone encounter to Jeralene Peters (spouse) for [name], 82 year old male as this RN CM  Following  up on referral received 01/26/17 from [place] telephonic CM coordinator to follow pt for Transition of care/recent discharge from [facility] 01/19/18, admitted to rehab after recent hospitalization August 29-31,2018 for status post total knee arthroplasty. Pt has a history of but not limited to Hypertension, CAD, Hyperlipidemia, Sleep apnea, Dementia, falls.  HIPAA compliant voice message left with contact name and number.   Plan:  If no response to voice message left, plan to follow up again within next 2 days.    Zara Chess.   [place] Care Management  [phone]

## 2017-01-29 ENCOUNTER — Other Ambulatory Visit: Payer: Self-pay | Admitting: *Deleted

## 2017-01-29 NOTE — Patient Outreach (Signed)
[place] [facility]) Care Management  01/29/2017  JMARI PELC 15-Sep-1934 [id number]   Patient referred to this social worker to assess for community resource needs related to inability to pay co-payments. Phone call to patient's spouse on her cell phone as requested. There was no answer. HIPPA complaint voicemail message left requesting a return call.   [name] [facility] Care Management [phone]

## 2017-01-29 NOTE — Patient Outreach (Signed)
Second unsuccessful telephone encounter to [name] (spouse, preferred contact) for [name], 82 year old  Male - following up on referral received 01/26/17 from [place] telephonic CM coordinator for Community CM services/transition of care/recent SNF discharge 01/19/17.   Pt admitted to rehab after recent hospitalization August 29-31,2018 for status post total knee (left) arthroplasty.   Pt has a history but not limited to Hypertension,CAD, Hyperlipidemia, Sleep apnea, Dementia, falls.  HIPAA compliant voice message left with contact name and number.    Zara Chess.   [place] Care Management  [phone]

## 2017-01-30 DIAGNOSIS — M25562 Pain in left knee: Secondary | ICD-10-CM | POA: Diagnosis not present

## 2017-01-30 DIAGNOSIS — M25662 Stiffness of left knee, not elsewhere classified: Secondary | ICD-10-CM | POA: Diagnosis not present

## 2017-01-30 DIAGNOSIS — Z96652 Presence of left artificial knee joint: Secondary | ICD-10-CM | POA: Diagnosis not present

## 2017-01-30 DIAGNOSIS — R29898 Other symptoms and signs involving the musculoskeletal system: Secondary | ICD-10-CM | POA: Diagnosis not present

## 2017-02-02 ENCOUNTER — Other Ambulatory Visit: Payer: Self-pay | Admitting: *Deleted

## 2017-02-02 DIAGNOSIS — Z96652 Presence of left artificial knee joint: Secondary | ICD-10-CM | POA: Diagnosis not present

## 2017-02-02 DIAGNOSIS — M25562 Pain in left knee: Secondary | ICD-10-CM | POA: Diagnosis not present

## 2017-02-02 NOTE — Patient Outreach (Signed)
[place] [facility]) Care Management  02/02/2017  [name] 04-10-1935 [id number]   Phone call to patient's spouse to discuss financial concerns and to assist with community resources related to co-pays and in home support. HIPPA complaint voicemail message left for a return call.   [name] [facility] Care Management [phone]

## 2017-02-03 ENCOUNTER — Other Ambulatory Visit: Payer: Self-pay | Admitting: *Deleted

## 2017-02-03 ENCOUNTER — Encounter: Payer: Self-pay | Admitting: *Deleted

## 2017-02-03 NOTE — Patient Outreach (Signed)
Third unsuccessful telephone encounter to Jeralene Peters (spouse, preferred contact) for Melanie Crazier, 82 year old male- Follow up on referral received 01/26/17 from [place] telephonic CM coordinator for [facility] CM services/transition  Of care/recent SNF discharge September 17,2018.  Pt admitted to rehab after recent hospitalization August 29-31,2018 for status post  Left total knee arthroplasty.  Pt's history includes but not limited to Hypertension,CAD, Hyperlipidemia, Sleep apnea, Dementia, falls.  HIPAA compliant voice message left with contact name and number, request to return call.     Plan:  If no response to voice message left/3 unsuccessful phone calls made- unable to contact letter to be sent to pt and if no response  To letter in 10 business days- to close case and notify PCP.    Zara Chess.   [place] Care Management  [phone]

## 2017-02-03 NOTE — Patient Outreach (Signed)
[facility]) Care Management  02/03/2017  [name] 1934-05-30 [id number]   Third unsuccessful attempt to reach patient and patient's spouse to discuss financial concerns and to assist with community resources related to co-pays and in home support. HIPPA compliant voicemail message left for a return call.     Plan: Contact letter to be sent to patient's home.           RNCM to be notified.   [name] [facility] Care Management [phone]

## 2017-02-04 DIAGNOSIS — M25562 Pain in left knee: Secondary | ICD-10-CM | POA: Diagnosis not present

## 2017-02-04 DIAGNOSIS — Z96652 Presence of left artificial knee joint: Secondary | ICD-10-CM | POA: Diagnosis not present

## 2017-02-06 DIAGNOSIS — Z96652 Presence of left artificial knee joint: Secondary | ICD-10-CM | POA: Diagnosis not present

## 2017-02-09 DIAGNOSIS — R29898 Other symptoms and signs involving the musculoskeletal system: Secondary | ICD-10-CM | POA: Diagnosis not present

## 2017-02-09 DIAGNOSIS — Z96652 Presence of left artificial knee joint: Secondary | ICD-10-CM | POA: Diagnosis not present

## 2017-02-09 DIAGNOSIS — M25562 Pain in left knee: Secondary | ICD-10-CM | POA: Diagnosis not present

## 2017-02-09 DIAGNOSIS — M25662 Stiffness of left knee, not elsewhere classified: Secondary | ICD-10-CM | POA: Diagnosis not present

## 2017-02-11 DIAGNOSIS — Z96652 Presence of left artificial knee joint: Secondary | ICD-10-CM | POA: Diagnosis not present

## 2017-02-12 DIAGNOSIS — Z96652 Presence of left artificial knee joint: Secondary | ICD-10-CM | POA: Diagnosis not present

## 2017-02-13 DIAGNOSIS — M25562 Pain in left knee: Secondary | ICD-10-CM | POA: Diagnosis not present

## 2017-02-13 DIAGNOSIS — Z96652 Presence of left artificial knee joint: Secondary | ICD-10-CM | POA: Diagnosis not present

## 2017-02-16 DIAGNOSIS — Z96652 Presence of left artificial knee joint: Secondary | ICD-10-CM | POA: Diagnosis not present

## 2017-02-16 DIAGNOSIS — M25562 Pain in left knee: Secondary | ICD-10-CM | POA: Diagnosis not present

## 2017-02-18 ENCOUNTER — Other Ambulatory Visit: Payer: Self-pay | Admitting: *Deleted

## 2017-02-18 DIAGNOSIS — Z96652 Presence of left artificial knee joint: Secondary | ICD-10-CM | POA: Diagnosis not present

## 2017-02-18 NOTE — Patient Outreach (Signed)
[place] [facility]) Care Management  02/18/2017  [name] Aug 05, 1934 [id number]   Patient was referred to this [name] social worker to discuss financial concerns and to assist with community resources related to co-pays and in home support.  3 unsuccessful attempts made to reach patient and a contact letter was sent to patient's home with no response.   Plan: Patient to be closed to social worker this time.           RNCM Rose Pierzchala to be notified of case closure.    [name] [facility] Care Management [phone]

## 2017-02-20 ENCOUNTER — Encounter: Payer: Self-pay | Admitting: *Deleted

## 2017-02-20 ENCOUNTER — Other Ambulatory Visit: Payer: Self-pay | Admitting: *Deleted

## 2017-02-20 DIAGNOSIS — Z96652 Presence of left artificial knee joint: Secondary | ICD-10-CM | POA: Diagnosis not present

## 2017-02-20 NOTE — Patient Outreach (Signed)
Pt was referred to this RN CM 01/26/17  by [place] [facility] telephonic RN CM for Community CM services/transition of care/recent SNF discharge September 17,2018/admitted to rehab after recent hospitalization August 29-31,2018  For status post left total knee arthroplasty.   Three unsuccessful phone calls made to [facility] (spouse,preferred contact), no responses so unable to contact letter sent to pt's home- no response.    Plan:  RN CM to close case- due to unable to contact.            Plan to update Dr. Gilford Rile of referral/unable to contact.            Plan to inform [facility] CMA to close case.    Zara Chess.   [place] Care Management  [phone]

## 2017-02-22 ENCOUNTER — Other Ambulatory Visit: Payer: Self-pay | Admitting: Urology

## 2017-02-22 DIAGNOSIS — N400 Enlarged prostate without lower urinary tract symptoms: Secondary | ICD-10-CM

## 2017-02-23 DIAGNOSIS — Z96652 Presence of left artificial knee joint: Secondary | ICD-10-CM | POA: Diagnosis not present

## 2017-02-25 DIAGNOSIS — Z96652 Presence of left artificial knee joint: Secondary | ICD-10-CM | POA: Diagnosis not present

## 2017-02-26 ENCOUNTER — Other Ambulatory Visit: Payer: Self-pay | Admitting: *Deleted

## 2017-02-26 NOTE — Patient Outreach (Signed)
[place] [facility]) Care Management  02/26/2017  [name] 02-21-1935 [id number]   Patient referred to this social worker through the [place] to assist with community resource needs. Three unsuccessful phone calls made to patient and there was no response.  Unable to contact letter set to patient's home, however there was no response.   Plan This social worker to close case due to inability to contact patient.          Letter to be sent to patient's doctor to inform him up case closure.   [name] [facility] Care Management [phone]

## 2017-02-27 DIAGNOSIS — M06062 Rheumatoid arthritis without rheumatoid factor, left knee: Secondary | ICD-10-CM | POA: Diagnosis not present

## 2017-02-27 DIAGNOSIS — Z96652 Presence of left artificial knee joint: Secondary | ICD-10-CM | POA: Diagnosis not present

## 2017-02-27 DIAGNOSIS — M6281 Muscle weakness (generalized): Secondary | ICD-10-CM | POA: Diagnosis not present

## 2017-02-27 DIAGNOSIS — M1990 Unspecified osteoarthritis, unspecified site: Secondary | ICD-10-CM | POA: Diagnosis not present

## 2017-03-02 ENCOUNTER — Other Ambulatory Visit: Payer: Self-pay | Admitting: Urology

## 2017-03-02 DIAGNOSIS — M25562 Pain in left knee: Secondary | ICD-10-CM | POA: Diagnosis not present

## 2017-03-02 DIAGNOSIS — N400 Enlarged prostate without lower urinary tract symptoms: Secondary | ICD-10-CM

## 2017-03-02 DIAGNOSIS — Z96652 Presence of left artificial knee joint: Secondary | ICD-10-CM | POA: Diagnosis not present

## 2017-03-04 DIAGNOSIS — Z96652 Presence of left artificial knee joint: Secondary | ICD-10-CM | POA: Diagnosis not present

## 2017-03-09 DIAGNOSIS — Z96652 Presence of left artificial knee joint: Secondary | ICD-10-CM | POA: Diagnosis not present

## 2017-03-10 ENCOUNTER — Other Ambulatory Visit: Payer: Self-pay | Admitting: Urology

## 2017-03-10 DIAGNOSIS — F0150 Vascular dementia without behavioral disturbance: Secondary | ICD-10-CM | POA: Diagnosis not present

## 2017-03-10 DIAGNOSIS — G4733 Obstructive sleep apnea (adult) (pediatric): Secondary | ICD-10-CM | POA: Diagnosis not present

## 2017-03-10 DIAGNOSIS — R2681 Unsteadiness on feet: Secondary | ICD-10-CM | POA: Diagnosis not present

## 2017-03-10 DIAGNOSIS — F0280 Dementia in other diseases classified elsewhere without behavioral disturbance: Secondary | ICD-10-CM | POA: Diagnosis not present

## 2017-03-10 DIAGNOSIS — G309 Alzheimer's disease, unspecified: Secondary | ICD-10-CM | POA: Diagnosis not present

## 2017-03-10 DIAGNOSIS — N400 Enlarged prostate without lower urinary tract symptoms: Secondary | ICD-10-CM

## 2017-03-11 ENCOUNTER — Other Ambulatory Visit: Payer: Self-pay | Admitting: Urology

## 2017-03-11 DIAGNOSIS — Z96652 Presence of left artificial knee joint: Secondary | ICD-10-CM | POA: Diagnosis not present

## 2017-03-11 DIAGNOSIS — N400 Enlarged prostate without lower urinary tract symptoms: Secondary | ICD-10-CM

## 2017-03-11 DIAGNOSIS — M25562 Pain in left knee: Secondary | ICD-10-CM | POA: Diagnosis not present

## 2017-03-13 DIAGNOSIS — M25562 Pain in left knee: Secondary | ICD-10-CM | POA: Diagnosis not present

## 2017-03-13 DIAGNOSIS — Z96652 Presence of left artificial knee joint: Secondary | ICD-10-CM | POA: Diagnosis not present

## 2017-03-16 DIAGNOSIS — I10 Essential (primary) hypertension: Secondary | ICD-10-CM | POA: Diagnosis not present

## 2017-03-16 DIAGNOSIS — D649 Anemia, unspecified: Secondary | ICD-10-CM | POA: Diagnosis not present

## 2017-03-16 DIAGNOSIS — Z96652 Presence of left artificial knee joint: Secondary | ICD-10-CM | POA: Diagnosis not present

## 2017-03-18 ENCOUNTER — Other Ambulatory Visit: Payer: Self-pay | Admitting: Urology

## 2017-03-18 DIAGNOSIS — G4733 Obstructive sleep apnea (adult) (pediatric): Secondary | ICD-10-CM | POA: Diagnosis not present

## 2017-03-18 DIAGNOSIS — Z96652 Presence of left artificial knee joint: Secondary | ICD-10-CM | POA: Diagnosis not present

## 2017-03-18 DIAGNOSIS — M25562 Pain in left knee: Secondary | ICD-10-CM | POA: Diagnosis not present

## 2017-03-18 DIAGNOSIS — N400 Enlarged prostate without lower urinary tract symptoms: Secondary | ICD-10-CM

## 2017-03-20 DIAGNOSIS — Z96652 Presence of left artificial knee joint: Secondary | ICD-10-CM | POA: Diagnosis not present

## 2017-03-23 DIAGNOSIS — Z96652 Presence of left artificial knee joint: Secondary | ICD-10-CM | POA: Diagnosis not present

## 2017-03-23 DIAGNOSIS — M25562 Pain in left knee: Secondary | ICD-10-CM | POA: Diagnosis not present

## 2017-03-23 DIAGNOSIS — I10 Essential (primary) hypertension: Secondary | ICD-10-CM | POA: Diagnosis not present

## 2017-03-23 DIAGNOSIS — R32 Unspecified urinary incontinence: Secondary | ICD-10-CM | POA: Diagnosis not present

## 2017-03-23 DIAGNOSIS — I482 Chronic atrial fibrillation: Secondary | ICD-10-CM | POA: Diagnosis not present

## 2017-03-23 DIAGNOSIS — Z23 Encounter for immunization: Secondary | ICD-10-CM | POA: Diagnosis not present

## 2017-03-23 DIAGNOSIS — F0390 Unspecified dementia without behavioral disturbance: Secondary | ICD-10-CM | POA: Diagnosis not present

## 2017-03-25 DIAGNOSIS — M25562 Pain in left knee: Secondary | ICD-10-CM | POA: Diagnosis not present

## 2017-03-25 DIAGNOSIS — Z96652 Presence of left artificial knee joint: Secondary | ICD-10-CM | POA: Diagnosis not present

## 2017-03-27 ENCOUNTER — Other Ambulatory Visit: Payer: Self-pay | Admitting: Urology

## 2017-03-27 DIAGNOSIS — N400 Enlarged prostate without lower urinary tract symptoms: Secondary | ICD-10-CM

## 2017-03-30 DIAGNOSIS — M06062 Rheumatoid arthritis without rheumatoid factor, left knee: Secondary | ICD-10-CM | POA: Diagnosis not present

## 2017-03-30 DIAGNOSIS — M1990 Unspecified osteoarthritis, unspecified site: Secondary | ICD-10-CM | POA: Diagnosis not present

## 2017-03-30 DIAGNOSIS — M6281 Muscle weakness (generalized): Secondary | ICD-10-CM | POA: Diagnosis not present

## 2017-04-09 DIAGNOSIS — Z96652 Presence of left artificial knee joint: Secondary | ICD-10-CM | POA: Diagnosis not present

## 2017-04-22 DIAGNOSIS — Z8659 Personal history of other mental and behavioral disorders: Secondary | ICD-10-CM | POA: Diagnosis not present

## 2017-04-22 DIAGNOSIS — R259 Unspecified abnormal involuntary movements: Secondary | ICD-10-CM | POA: Diagnosis not present

## 2017-04-22 DIAGNOSIS — I482 Chronic atrial fibrillation: Secondary | ICD-10-CM | POA: Diagnosis not present

## 2017-04-22 DIAGNOSIS — G309 Alzheimer's disease, unspecified: Secondary | ICD-10-CM | POA: Diagnosis not present

## 2017-04-22 DIAGNOSIS — F0280 Dementia in other diseases classified elsewhere without behavioral disturbance: Secondary | ICD-10-CM | POA: Diagnosis not present

## 2017-04-22 DIAGNOSIS — F0150 Vascular dementia without behavioral disturbance: Secondary | ICD-10-CM | POA: Diagnosis not present

## 2017-04-22 DIAGNOSIS — G4733 Obstructive sleep apnea (adult) (pediatric): Secondary | ICD-10-CM | POA: Diagnosis not present

## 2017-04-29 DIAGNOSIS — M06062 Rheumatoid arthritis without rheumatoid factor, left knee: Secondary | ICD-10-CM | POA: Diagnosis not present

## 2017-04-29 DIAGNOSIS — M6281 Muscle weakness (generalized): Secondary | ICD-10-CM | POA: Diagnosis not present

## 2017-04-29 DIAGNOSIS — M1990 Unspecified osteoarthritis, unspecified site: Secondary | ICD-10-CM | POA: Diagnosis not present

## 2017-05-26 DIAGNOSIS — I482 Chronic atrial fibrillation: Secondary | ICD-10-CM | POA: Diagnosis not present

## 2017-05-26 DIAGNOSIS — Z8659 Personal history of other mental and behavioral disorders: Secondary | ICD-10-CM | POA: Diagnosis not present

## 2017-05-26 DIAGNOSIS — G4733 Obstructive sleep apnea (adult) (pediatric): Secondary | ICD-10-CM | POA: Diagnosis not present

## 2017-05-26 DIAGNOSIS — Z9989 Dependence on other enabling machines and devices: Secondary | ICD-10-CM | POA: Diagnosis not present

## 2017-05-26 DIAGNOSIS — F0280 Dementia in other diseases classified elsewhere without behavioral disturbance: Secondary | ICD-10-CM | POA: Diagnosis not present

## 2017-05-26 DIAGNOSIS — F0150 Vascular dementia without behavioral disturbance: Secondary | ICD-10-CM | POA: Diagnosis not present

## 2017-05-26 DIAGNOSIS — G309 Alzheimer's disease, unspecified: Secondary | ICD-10-CM | POA: Diagnosis not present

## 2017-05-26 DIAGNOSIS — R259 Unspecified abnormal involuntary movements: Secondary | ICD-10-CM | POA: Diagnosis not present

## 2017-05-26 DIAGNOSIS — Z96652 Presence of left artificial knee joint: Secondary | ICD-10-CM | POA: Diagnosis not present

## 2017-05-30 DIAGNOSIS — M1990 Unspecified osteoarthritis, unspecified site: Secondary | ICD-10-CM | POA: Diagnosis not present

## 2017-05-30 DIAGNOSIS — M06062 Rheumatoid arthritis without rheumatoid factor, left knee: Secondary | ICD-10-CM | POA: Diagnosis not present

## 2017-05-30 DIAGNOSIS — M6281 Muscle weakness (generalized): Secondary | ICD-10-CM | POA: Diagnosis not present

## 2017-06-30 DIAGNOSIS — M1990 Unspecified osteoarthritis, unspecified site: Secondary | ICD-10-CM | POA: Diagnosis not present

## 2017-06-30 DIAGNOSIS — M06062 Rheumatoid arthritis without rheumatoid factor, left knee: Secondary | ICD-10-CM | POA: Diagnosis not present

## 2017-06-30 DIAGNOSIS — M6281 Muscle weakness (generalized): Secondary | ICD-10-CM | POA: Diagnosis not present

## 2017-11-24 ENCOUNTER — Encounter (INDEPENDENT_AMBULATORY_CARE_PROVIDER_SITE_OTHER): Payer: PPO

## 2017-11-24 ENCOUNTER — Ambulatory Visit (INDEPENDENT_AMBULATORY_CARE_PROVIDER_SITE_OTHER): Payer: PPO | Admitting: Vascular Surgery

## 2017-12-02 ENCOUNTER — Encounter (INDEPENDENT_AMBULATORY_CARE_PROVIDER_SITE_OTHER): Payer: Self-pay | Admitting: Vascular Surgery

## 2018-09-03 DEATH — deceased

## 2018-11-03 IMAGING — MR MR LUMBAR SPINE W/O CM
4 of 5 series · 24 of 48 positions shown · non-contrast
Comparison: CT chest, abdomen and pelvis 02/16/2016

CLINICAL DATA: Low back and bilateral buttock pain for several
months. No known injury.

EXAM:
MRI LUMBAR SPINE WITHOUT CONTRAST
TECHNIQUE: Multiplanar, multisequence MR imaging of the lumbar spine was
performed. No intravenous contrast was administered.

[Series 2: T2 · sagittal · 4.0mm · 0.81mm/px · 6 of 15 slices shown (1 of 2)]
[im 1/15]
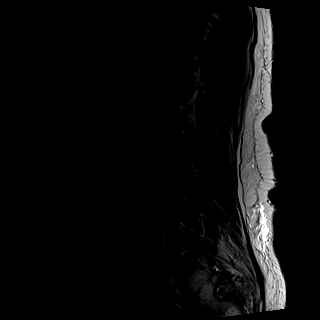
[im 3/15]
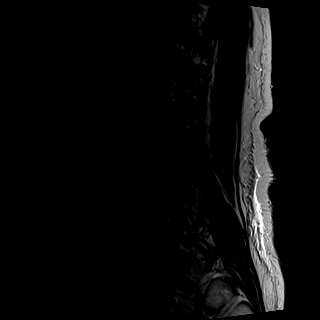
[im 6/15]
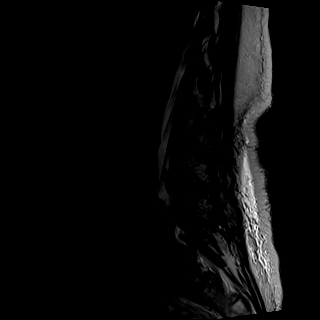
[im 9/15]
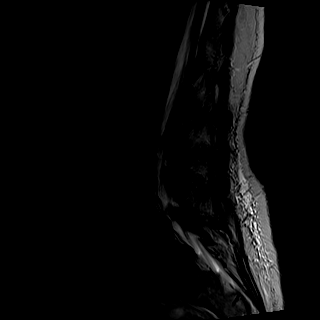
[im 12/15]
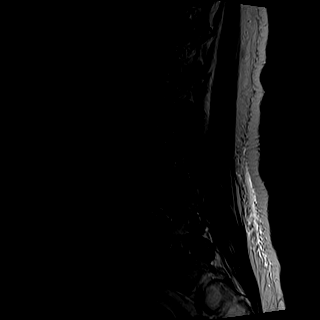
[im 15/15]
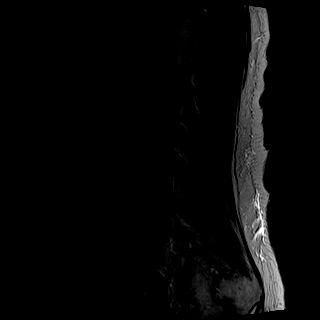

[Series 3: T1 · sagittal · 4.0mm · 0.41mm/px · 6 of 15 slices shown (1 of 2)]
[im 1/15]
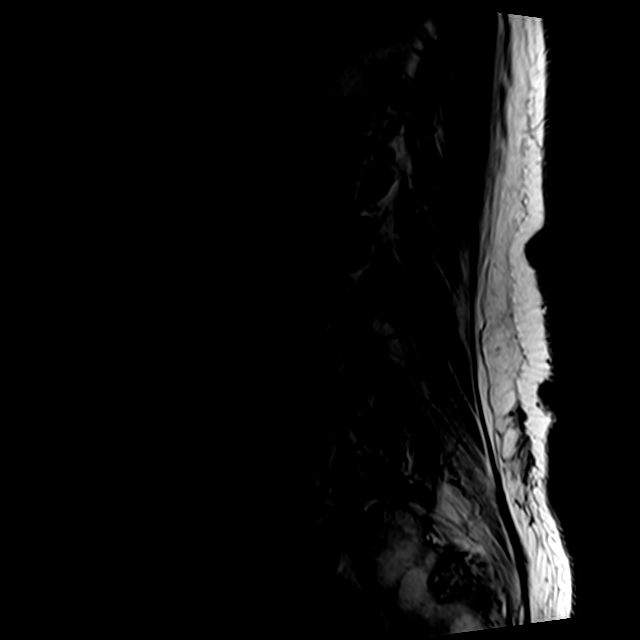
[im 3/15]
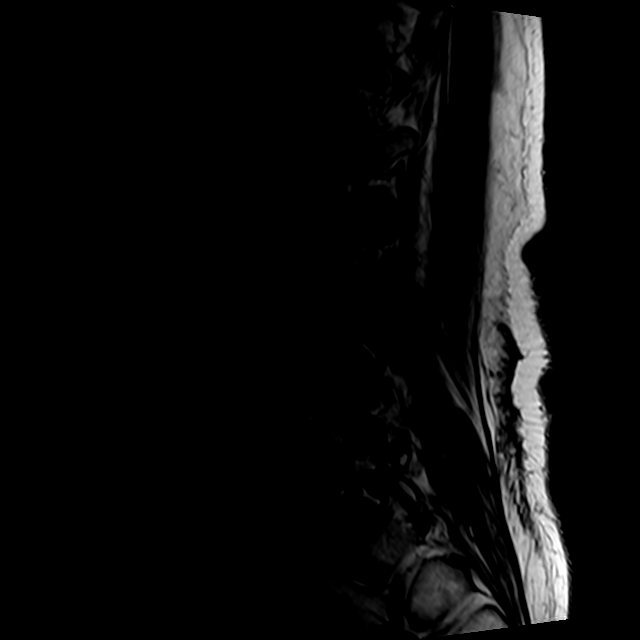
[im 6/15]
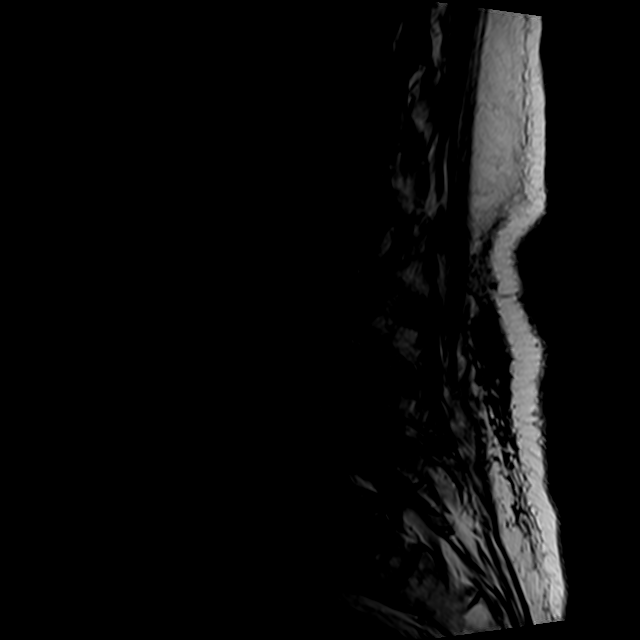
[im 9/15]
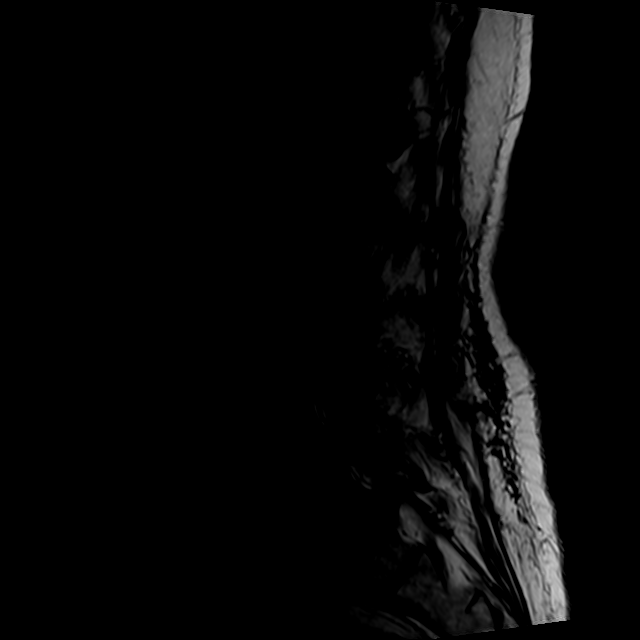
[im 12/15]
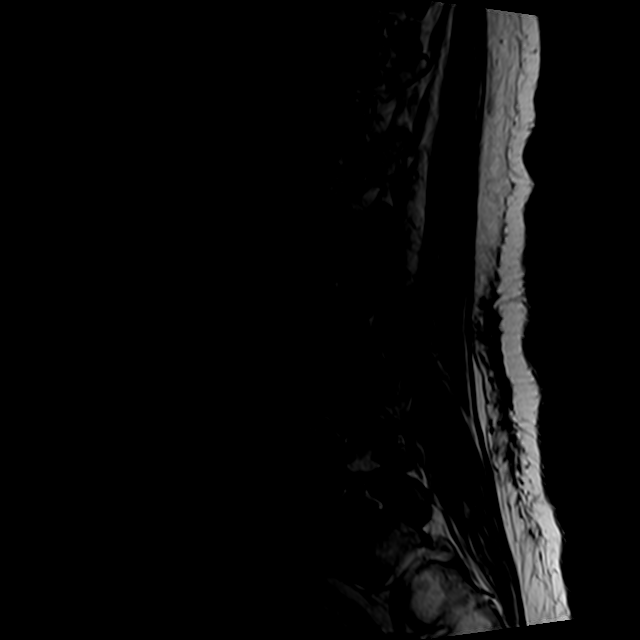
[im 15/15]
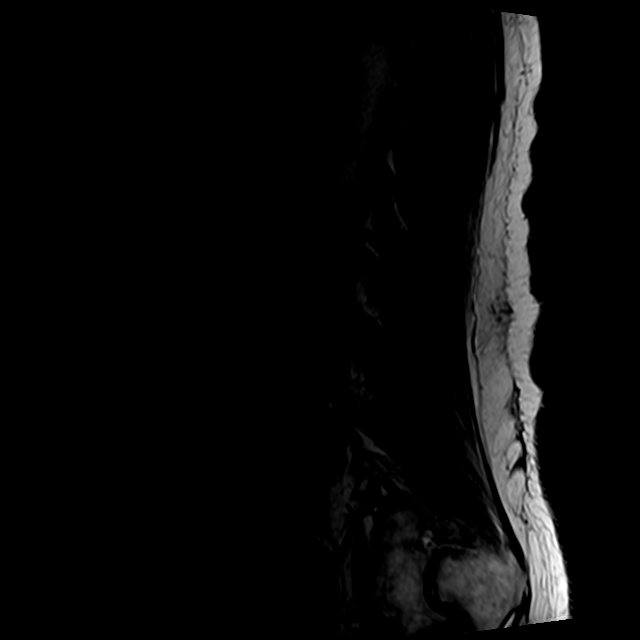

[Series 5: T2 · axial · 4.0mm · 0.78mm/px · z∈[-64,+142]mm · 9 of 38 slices shown (2 of 2)]
[im 1/38]
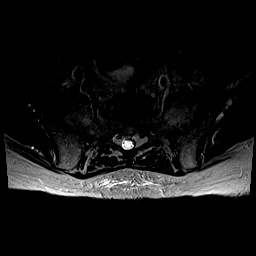
[im 6/38]
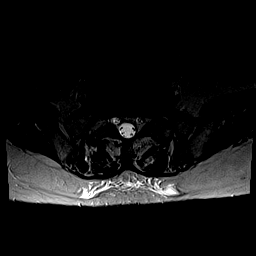
[im 11/38]
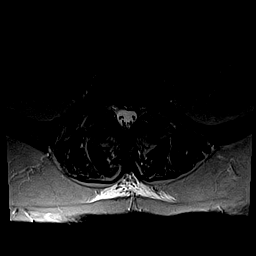
[im 16/38]
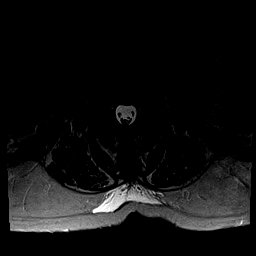
[im 19/38]
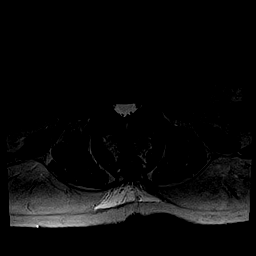
[im 22/38]
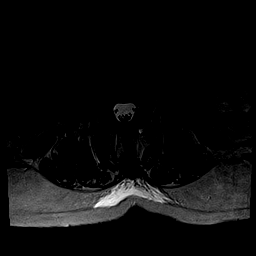
[im 27/38]
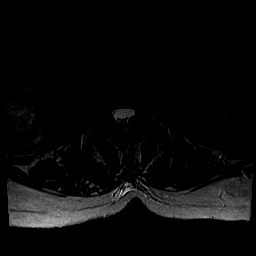
[im 32/38]
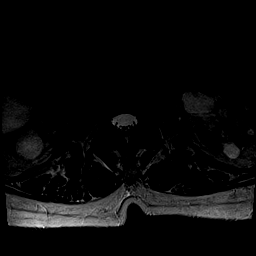
[im 38/38]
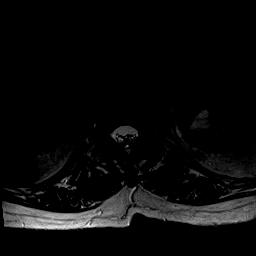

[Series 6: T1 · axial · 4.0mm · 0.31mm/px · z∈[-41,+112]mm · 3 of 38 slices shown (2 of 2)]
[im 6/38]
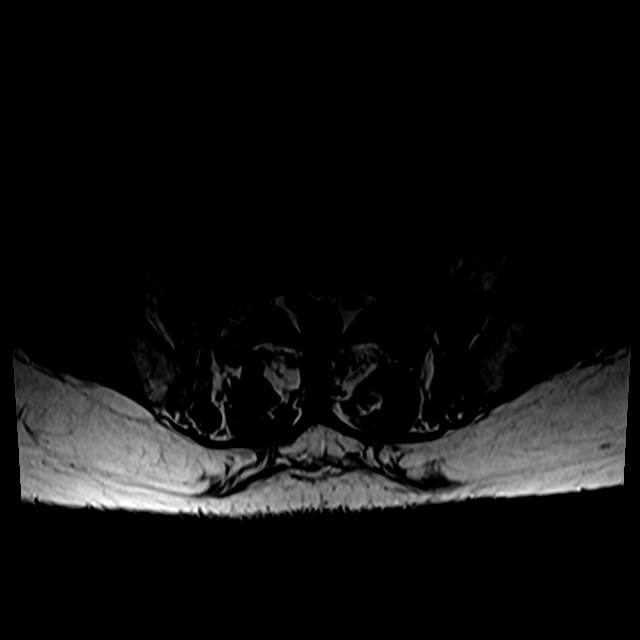
[im 19/38]
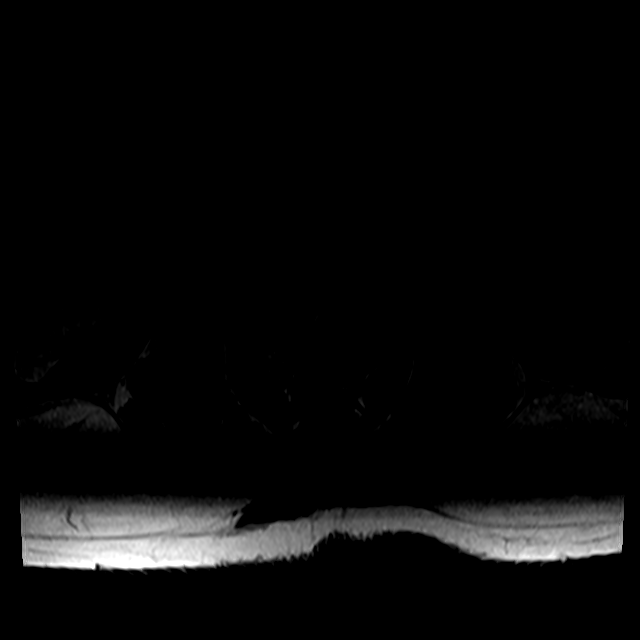
[im 32/38]
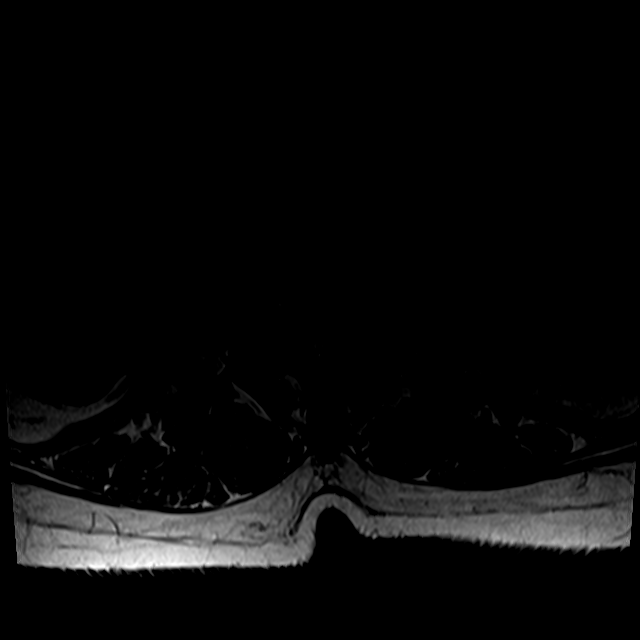

[24 of 48 positions shown; findings below may reference images not displayed]

FINDINGS: Segmentation:  Standard.

Alignment:  Maintained.

Vertebrae: No fracture or worrisome lesion. Small hemangioma in L5
incidentally noted.

Conus medullaris: Extends to the L1 level and appears normal.

Paraspinal and other soft tissues: Extensive cystic change of both
kidneys is identified as seen on the prior CT.

Disc levels:

T11-12 and T12-L1 are imaged in the sagittal plane only and
negative.

L1-2:  Negative.

L2-3: Small left foraminal protrusion without stenosis. The central
canal and right foramen are also patent.

L3-4: Shallow disc bulge and mild facet degenerative change without
central canal or foraminal stenosis.

L4-5: Moderate bilateral facet degenerative change. There is a mild
disc bulge and very shallow down turning central protrusion. The
central canal and left subarticular recess are mildly narrowed. The
foramina are open. No nerve root compression.

L5-S1: Shallow disc bulge and mild-to-moderate facet degenerative
change. The central canal and foramina remain open.
IMPRESSION: Mild spondylosis most notable at L4-5 where a very shallow disc
bulge and downturning central protrusion cause mild central canal
and left subarticular recess narrowing. No nerve root compression.

## 2018-12-21 IMAGING — CR DG ANKLE COMPLETE 3+V*L*
3 series · 3 of 3 positions shown · non-contrast
Comparison: None.

CLINICAL DATA: Nonhealing ulcer at lateral malleolus

EXAM:
LEFT ANKLE COMPLETE - 3+ VIEW

[ankle ap]
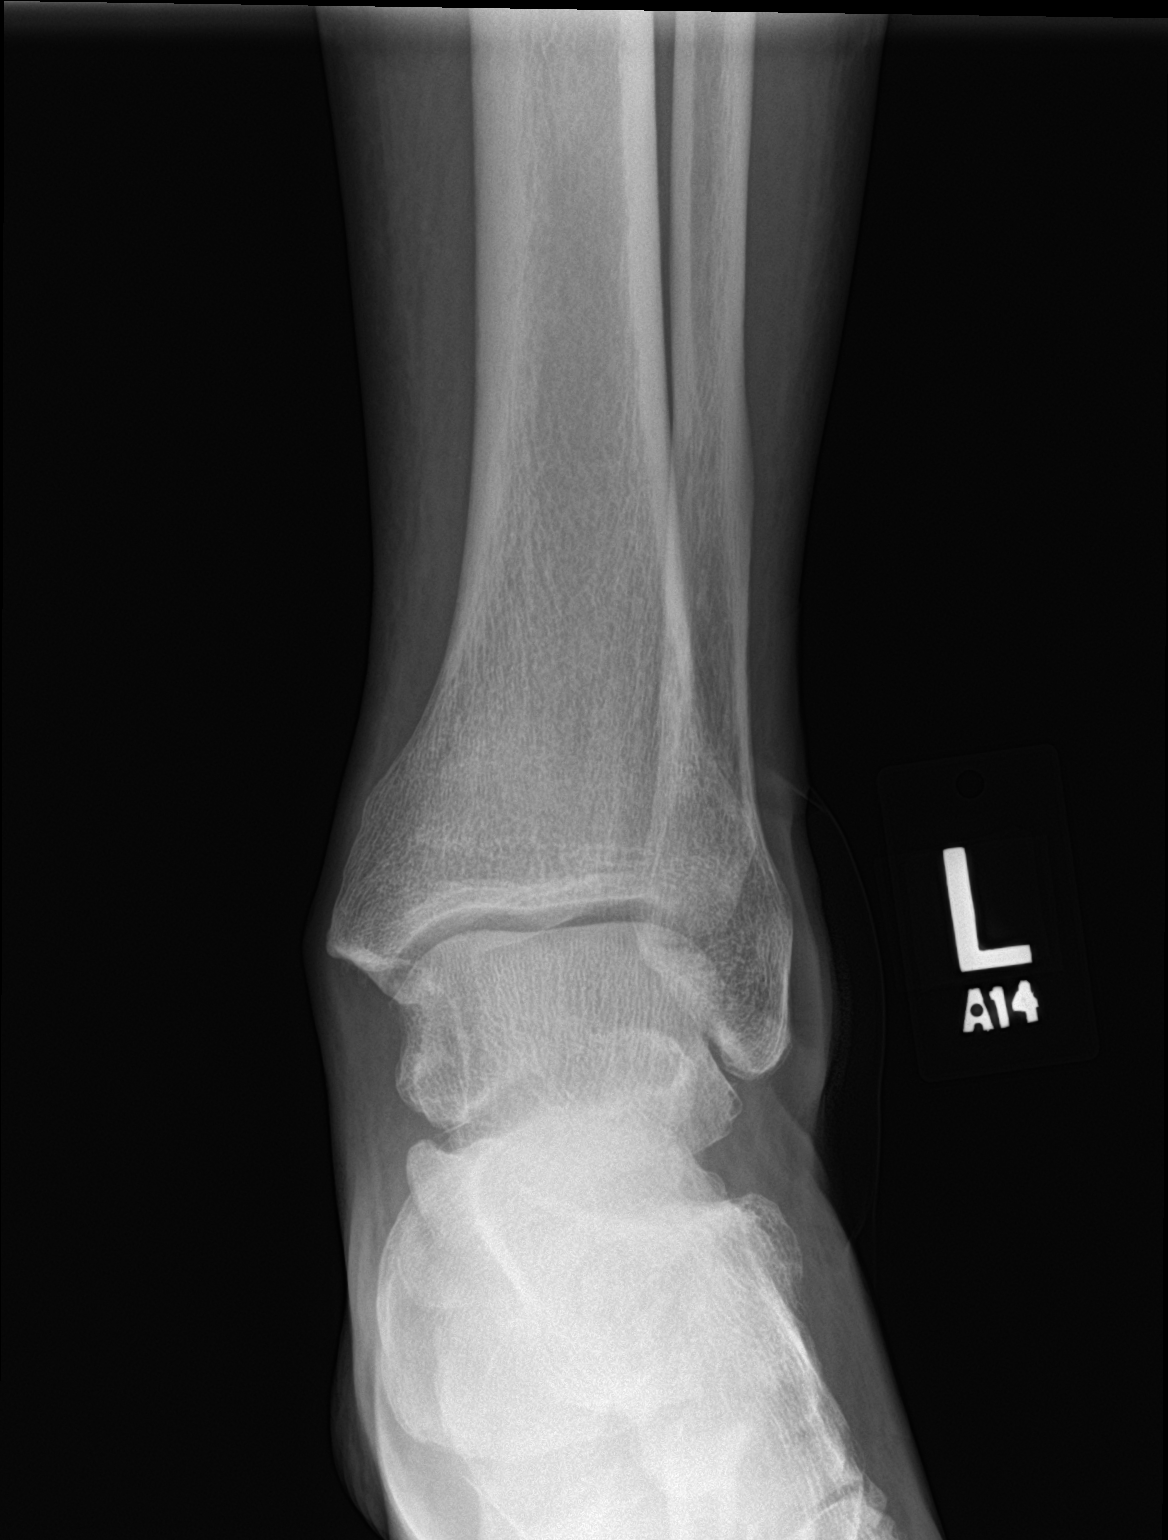

[ankle obl]
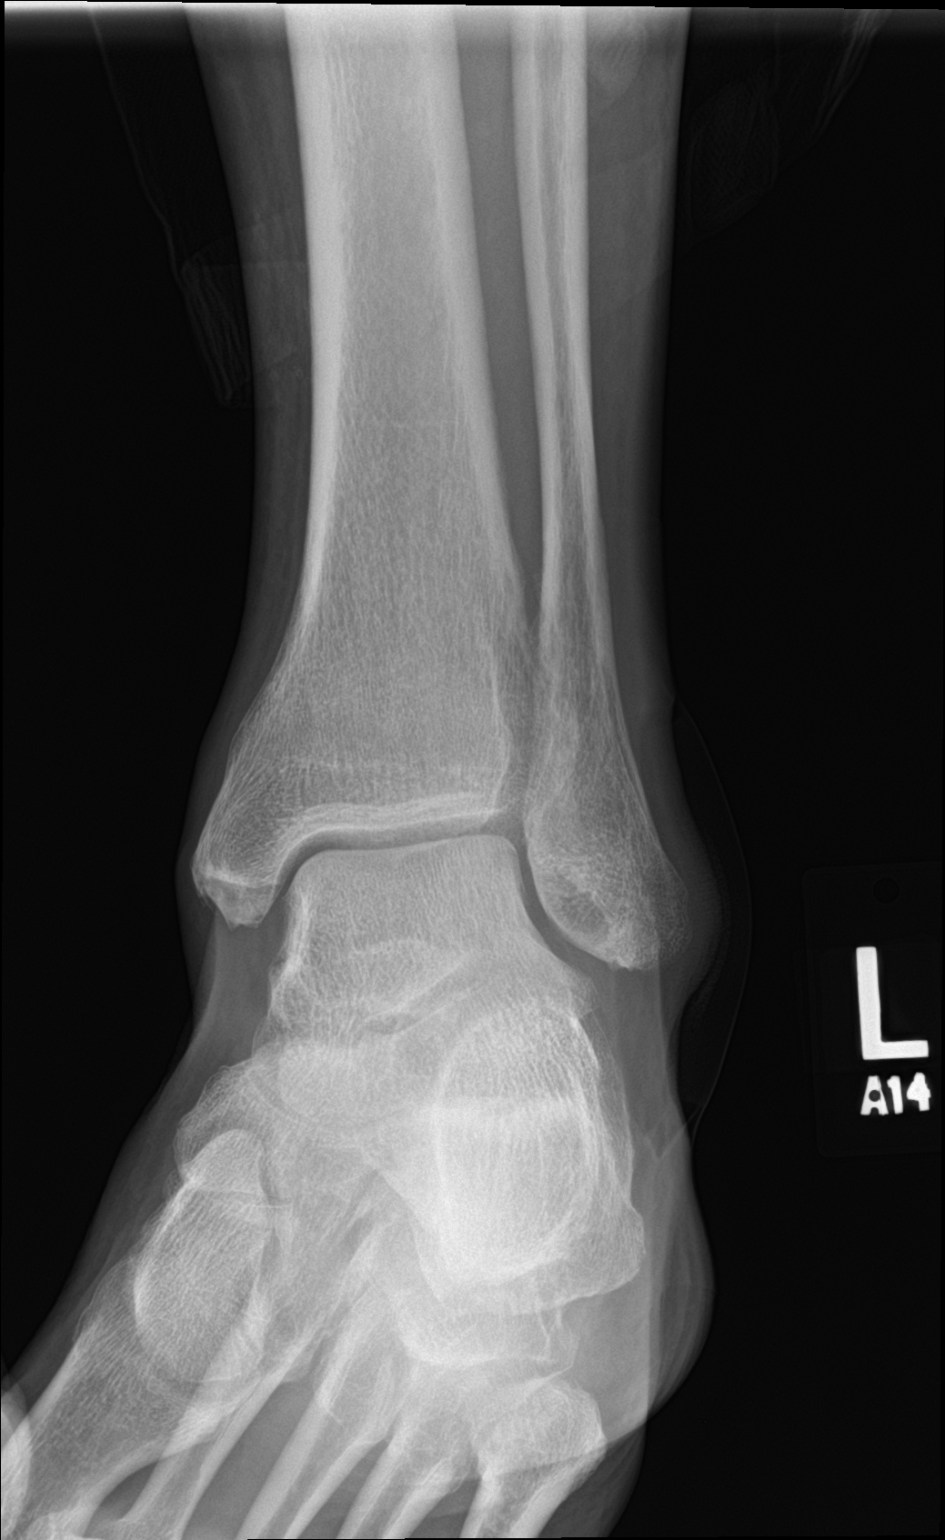

[ankle lat]
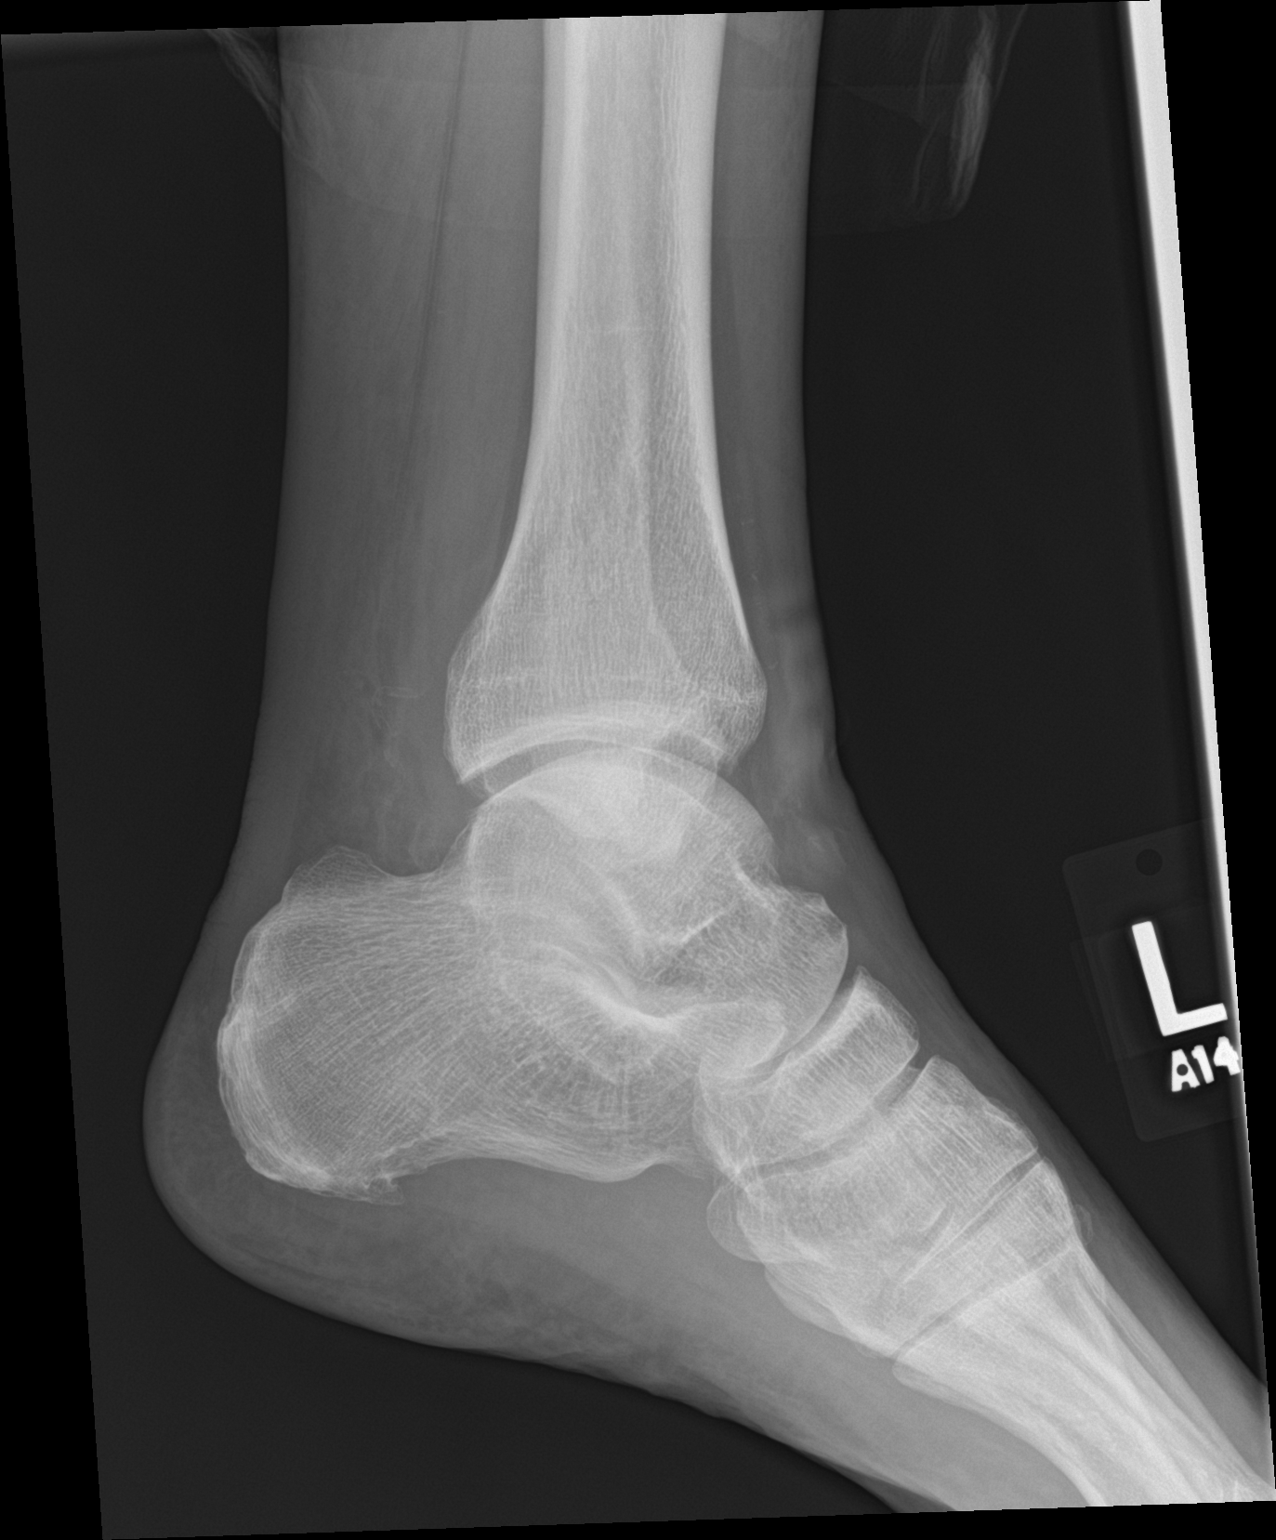

[3 of 3 positions shown; findings below may reference images not displayed]

FINDINGS: No acute fracture. No dislocation. Spurring at the inferior
calcaneus. Soft tissue swelling over the lateral malleolus. No
destructive bone lesion.
IMPRESSION: No acute bony pathology.
